# Patient Record
Sex: Male | Born: 1950 | Race: White | Hispanic: No | Smoking: Former smoker
Health system: Southern US, Community
[De-identification: ages and names within clinical notes are randomized; demographics above are authoritative.]

## PROBLEM LIST (undated history)

## (undated) DIAGNOSIS — J449 Chronic obstructive pulmonary disease, unspecified: Secondary | ICD-10-CM

## (undated) DIAGNOSIS — R351 Nocturia: Secondary | ICD-10-CM

## (undated) DIAGNOSIS — I779 Disorder of arteries and arterioles, unspecified: Secondary | ICD-10-CM

## (undated) DIAGNOSIS — R600 Localized edema: Secondary | ICD-10-CM

## (undated) DIAGNOSIS — I10 Essential (primary) hypertension: Secondary | ICD-10-CM

## (undated) DIAGNOSIS — Z789 Other specified health status: Secondary | ICD-10-CM

## (undated) DIAGNOSIS — G4733 Obstructive sleep apnea (adult) (pediatric): Secondary | ICD-10-CM

## (undated) DIAGNOSIS — I2510 Atherosclerotic heart disease of native coronary artery without angina pectoris: Secondary | ICD-10-CM

## (undated) DIAGNOSIS — M75102 Unspecified rotator cuff tear or rupture of left shoulder, not specified as traumatic: Secondary | ICD-10-CM

## (undated) DIAGNOSIS — E785 Hyperlipidemia, unspecified: Secondary | ICD-10-CM

## (undated) DIAGNOSIS — N529 Male erectile dysfunction, unspecified: Secondary | ICD-10-CM

## (undated) DIAGNOSIS — I219 Acute myocardial infarction, unspecified: Secondary | ICD-10-CM

## (undated) DIAGNOSIS — Z86018 Personal history of other benign neoplasm: Secondary | ICD-10-CM

## (undated) DIAGNOSIS — N3281 Overactive bladder: Secondary | ICD-10-CM

## (undated) DIAGNOSIS — I739 Peripheral vascular disease, unspecified: Secondary | ICD-10-CM

## (undated) DIAGNOSIS — I5030 Unspecified diastolic (congestive) heart failure: Secondary | ICD-10-CM

## (undated) DIAGNOSIS — K219 Gastro-esophageal reflux disease without esophagitis: Secondary | ICD-10-CM

## (undated) DIAGNOSIS — Z9989 Dependence on other enabling machines and devices: Secondary | ICD-10-CM

## (undated) DIAGNOSIS — Z951 Presence of aortocoronary bypass graft: Secondary | ICD-10-CM

## (undated) DIAGNOSIS — E119 Type 2 diabetes mellitus without complications: Secondary | ICD-10-CM

## (undated) DIAGNOSIS — Z993 Dependence on wheelchair: Secondary | ICD-10-CM

## (undated) DIAGNOSIS — I872 Venous insufficiency (chronic) (peripheral): Secondary | ICD-10-CM

## (undated) DIAGNOSIS — M21372 Foot drop, left foot: Secondary | ICD-10-CM

## (undated) DIAGNOSIS — M1990 Unspecified osteoarthritis, unspecified site: Secondary | ICD-10-CM

## (undated) DIAGNOSIS — I252 Old myocardial infarction: Secondary | ICD-10-CM

## (undated) DIAGNOSIS — M21371 Foot drop, right foot: Secondary | ICD-10-CM

## (undated) DIAGNOSIS — Z87828 Personal history of other (healed) physical injury and trauma: Secondary | ICD-10-CM

## (undated) DIAGNOSIS — Z872 Personal history of diseases of the skin and subcutaneous tissue: Secondary | ICD-10-CM

## (undated) DIAGNOSIS — R748 Abnormal levels of other serum enzymes: Secondary | ICD-10-CM

## (undated) DIAGNOSIS — N3941 Urge incontinence: Secondary | ICD-10-CM

## (undated) HISTORY — DX: Type 2 diabetes mellitus without complications: E11.9

## (undated) HISTORY — PX: TRIGGER FINGER RELEASE: SHX641

## (undated) HISTORY — DX: Abnormal levels of other serum enzymes: R74.8

## (undated) HISTORY — DX: Personal history of other (healed) physical injury and trauma: Z87.828

## (undated) HISTORY — PX: EYE SURGERY: SHX253

## (undated) HISTORY — DX: Acute myocardial infarction, unspecified: I21.9

## (undated) HISTORY — PX: CARDIAC CATHETERIZATION: SHX172

## (undated) HISTORY — DX: Peripheral vascular disease, unspecified: I73.9

## (undated) HISTORY — PX: CARDIOVASCULAR STRESS TEST: SHX262

## (undated) HISTORY — DX: Essential (primary) hypertension: I10

## (undated) HISTORY — DX: Disorder of arteries and arterioles, unspecified: I77.9

## (undated) HISTORY — PX: CORONARY ARTERY BYPASS GRAFT: SHX141

## (undated) HISTORY — DX: Chronic obstructive pulmonary disease, unspecified: J44.9

## (undated) HISTORY — PX: CHOLECYSTECTOMY: SHX55

## (undated) HISTORY — DX: Unspecified diastolic (congestive) heart failure: I50.30

## (undated) HISTORY — DX: Hyperlipidemia, unspecified: E78.5

## (undated) HISTORY — PX: TEAR DUCT PROBING: SHX793

## (undated) HISTORY — PX: LUMBAR FUSION: SHX111

## (undated) HISTORY — PX: BLEPHAROPLASTY: SUR158

## (undated) HISTORY — DX: Nocturia: R35.1

## (undated) HISTORY — DX: Atherosclerotic heart disease of native coronary artery without angina pectoris: I25.10

---

## 1992-01-02 HISTORY — PX: OTHER SURGICAL HISTORY: SHX169

## 1998-04-07 ENCOUNTER — Encounter: Payer: Self-pay | Admitting: Neurosurgery

## 1998-04-07 ENCOUNTER — Ambulatory Visit (HOSPITAL_COMMUNITY): Admission: RE | Admit: 1998-04-07 | Discharge: 1998-04-07 | Payer: Self-pay | Admitting: Neurosurgery

## 1998-05-27 ENCOUNTER — Other Ambulatory Visit: Admission: RE | Admit: 1998-05-27 | Discharge: 1998-05-27 | Payer: Self-pay | Admitting: Gastroenterology

## 1998-06-16 ENCOUNTER — Ambulatory Visit (HOSPITAL_COMMUNITY): Admission: RE | Admit: 1998-06-16 | Discharge: 1998-06-16 | Payer: Self-pay | Admitting: Neurosurgery

## 1998-06-16 ENCOUNTER — Encounter: Payer: Self-pay | Admitting: Neurosurgery

## 1998-07-15 ENCOUNTER — Encounter: Payer: Self-pay | Admitting: Neurosurgery

## 1998-07-19 ENCOUNTER — Inpatient Hospital Stay (HOSPITAL_COMMUNITY): Admission: RE | Admit: 1998-07-19 | Discharge: 1998-07-27 | Payer: Self-pay | Admitting: Neurosurgery

## 1998-07-19 ENCOUNTER — Encounter: Payer: Self-pay | Admitting: Neurosurgery

## 1998-07-27 ENCOUNTER — Inpatient Hospital Stay (HOSPITAL_COMMUNITY)
Admission: RE | Admit: 1998-07-27 | Discharge: 1998-08-12 | Payer: Self-pay | Admitting: Physical Medicine & Rehabilitation

## 1998-08-15 ENCOUNTER — Encounter
Admission: RE | Admit: 1998-08-15 | Discharge: 1998-11-13 | Payer: Self-pay | Admitting: Physical Medicine & Rehabilitation

## 1998-09-08 ENCOUNTER — Ambulatory Visit (HOSPITAL_COMMUNITY): Admission: RE | Admit: 1998-09-08 | Discharge: 1998-09-08 | Payer: Self-pay | Admitting: Internal Medicine

## 1998-11-14 ENCOUNTER — Encounter
Admission: RE | Admit: 1998-11-14 | Discharge: 1999-01-11 | Payer: Self-pay | Admitting: Physical Medicine & Rehabilitation

## 1999-01-31 ENCOUNTER — Encounter
Admission: RE | Admit: 1999-01-31 | Discharge: 1999-03-22 | Payer: Self-pay | Admitting: Physical Medicine & Rehabilitation

## 1999-07-10 ENCOUNTER — Ambulatory Visit (HOSPITAL_COMMUNITY): Admission: RE | Admit: 1999-07-10 | Discharge: 1999-07-10 | Payer: Self-pay | Admitting: Neurosurgery

## 1999-07-10 ENCOUNTER — Encounter: Payer: Self-pay | Admitting: Neurosurgery

## 1999-07-11 ENCOUNTER — Encounter: Payer: Self-pay | Admitting: Neurosurgery

## 1999-08-14 ENCOUNTER — Encounter: Payer: Self-pay | Admitting: Neurosurgery

## 1999-08-15 ENCOUNTER — Inpatient Hospital Stay (HOSPITAL_COMMUNITY): Admission: RE | Admit: 1999-08-15 | Discharge: 1999-08-16 | Payer: Self-pay | Admitting: Neurosurgery

## 1999-08-15 ENCOUNTER — Encounter: Payer: Self-pay | Admitting: Neurosurgery

## 1999-08-15 HISTORY — PX: ANTERIOR CERVICAL DECOMP/DISCECTOMY FUSION: SHX1161

## 1999-08-17 ENCOUNTER — Inpatient Hospital Stay (HOSPITAL_COMMUNITY): Admission: AD | Admit: 1999-08-17 | Discharge: 1999-08-22 | Payer: Self-pay | Admitting: Neurosurgery

## 1999-08-17 ENCOUNTER — Encounter: Admission: RE | Admit: 1999-08-17 | Discharge: 1999-08-17 | Payer: Self-pay | Admitting: Neurosurgery

## 1999-08-17 ENCOUNTER — Encounter: Payer: Self-pay | Admitting: Neurosurgery

## 1999-11-30 ENCOUNTER — Encounter: Payer: Self-pay | Admitting: Neurosurgery

## 1999-11-30 ENCOUNTER — Ambulatory Visit (HOSPITAL_COMMUNITY): Admission: RE | Admit: 1999-11-30 | Discharge: 1999-11-30 | Payer: Self-pay | Admitting: Neurosurgery

## 2000-07-22 ENCOUNTER — Ambulatory Visit (HOSPITAL_COMMUNITY): Admission: RE | Admit: 2000-07-22 | Discharge: 2000-07-22 | Payer: Self-pay | Admitting: Neurosurgery

## 2000-07-22 ENCOUNTER — Encounter: Payer: Self-pay | Admitting: Neurosurgery

## 2000-11-13 ENCOUNTER — Ambulatory Visit (HOSPITAL_COMMUNITY): Admission: RE | Admit: 2000-11-13 | Discharge: 2000-11-13 | Payer: Self-pay | Admitting: Neurosurgery

## 2000-11-13 ENCOUNTER — Encounter: Payer: Self-pay | Admitting: Neurosurgery

## 2000-11-26 ENCOUNTER — Ambulatory Visit (HOSPITAL_COMMUNITY): Admission: RE | Admit: 2000-11-26 | Discharge: 2000-11-26 | Payer: Self-pay | Admitting: Gastroenterology

## 2000-11-26 ENCOUNTER — Encounter: Payer: Self-pay | Admitting: Gastroenterology

## 2001-09-16 ENCOUNTER — Ambulatory Visit (HOSPITAL_COMMUNITY): Admission: RE | Admit: 2001-09-16 | Discharge: 2001-09-16 | Payer: Self-pay | Admitting: Neurosurgery

## 2001-09-16 ENCOUNTER — Encounter: Payer: Self-pay | Admitting: Neurosurgery

## 2001-11-06 ENCOUNTER — Ambulatory Visit (HOSPITAL_COMMUNITY): Admission: RE | Admit: 2001-11-06 | Discharge: 2001-11-06 | Payer: Self-pay | Admitting: Gastroenterology

## 2001-11-06 ENCOUNTER — Encounter: Payer: Self-pay | Admitting: Gastroenterology

## 2001-11-07 ENCOUNTER — Ambulatory Visit (HOSPITAL_COMMUNITY): Admission: RE | Admit: 2001-11-07 | Discharge: 2001-11-07 | Payer: Self-pay | Admitting: Gastroenterology

## 2001-11-07 ENCOUNTER — Encounter: Payer: Self-pay | Admitting: Gastroenterology

## 2001-11-13 ENCOUNTER — Ambulatory Visit (HOSPITAL_COMMUNITY): Admission: RE | Admit: 2001-11-13 | Discharge: 2001-11-13 | Payer: Self-pay | Admitting: Gastroenterology

## 2001-11-13 ENCOUNTER — Encounter: Payer: Self-pay | Admitting: Gastroenterology

## 2001-11-14 ENCOUNTER — Ambulatory Visit (HOSPITAL_COMMUNITY): Admission: RE | Admit: 2001-11-14 | Discharge: 2001-11-14 | Payer: Self-pay | Admitting: Gastroenterology

## 2001-11-14 ENCOUNTER — Encounter: Payer: Self-pay | Admitting: Gastroenterology

## 2001-11-21 ENCOUNTER — Encounter: Admission: RE | Admit: 2001-11-21 | Discharge: 2001-11-21 | Payer: Self-pay | Admitting: Gastroenterology

## 2001-11-21 ENCOUNTER — Encounter: Payer: Self-pay | Admitting: Gastroenterology

## 2002-01-01 HISTORY — PX: OTHER SURGICAL HISTORY: SHX169

## 2002-01-13 ENCOUNTER — Observation Stay (HOSPITAL_COMMUNITY): Admission: RE | Admit: 2002-01-13 | Discharge: 2002-01-14 | Payer: Self-pay | Admitting: Surgery

## 2002-01-13 ENCOUNTER — Encounter: Payer: Self-pay | Admitting: Surgery

## 2002-01-13 ENCOUNTER — Encounter (INDEPENDENT_AMBULATORY_CARE_PROVIDER_SITE_OTHER): Payer: Self-pay

## 2002-01-13 HISTORY — PX: LAPAROSCOPIC CHOLECYSTECTOMY: SUR755

## 2002-03-27 ENCOUNTER — Encounter: Payer: Self-pay | Admitting: Neurosurgery

## 2002-03-27 ENCOUNTER — Ambulatory Visit (HOSPITAL_COMMUNITY): Admission: RE | Admit: 2002-03-27 | Discharge: 2002-03-27 | Payer: Self-pay | Admitting: Neurosurgery

## 2002-05-26 ENCOUNTER — Ambulatory Visit (HOSPITAL_COMMUNITY): Admission: RE | Admit: 2002-05-26 | Discharge: 2002-05-26 | Payer: Self-pay | Admitting: Gastroenterology

## 2003-01-02 DIAGNOSIS — I219 Acute myocardial infarction, unspecified: Secondary | ICD-10-CM

## 2003-01-02 HISTORY — DX: Acute myocardial infarction, unspecified: I21.9

## 2003-02-07 ENCOUNTER — Inpatient Hospital Stay (HOSPITAL_COMMUNITY): Admission: EM | Admit: 2003-02-07 | Discharge: 2003-02-12 | Payer: Self-pay | Admitting: Emergency Medicine

## 2003-02-07 HISTORY — PX: CORONARY ANGIOPLASTY WITH STENT PLACEMENT: SHX49

## 2003-02-11 ENCOUNTER — Encounter: Payer: Self-pay | Admitting: Cardiology

## 2003-04-19 ENCOUNTER — Ambulatory Visit (HOSPITAL_COMMUNITY): Admission: RE | Admit: 2003-04-19 | Discharge: 2003-04-19 | Payer: Self-pay | Admitting: Neurosurgery

## 2003-04-22 ENCOUNTER — Inpatient Hospital Stay (HOSPITAL_COMMUNITY): Admission: AD | Admit: 2003-04-22 | Discharge: 2003-05-05 | Payer: Self-pay | Admitting: Cardiology

## 2003-12-28 ENCOUNTER — Inpatient Hospital Stay (HOSPITAL_COMMUNITY): Admission: EM | Admit: 2003-12-28 | Discharge: 2003-12-30 | Payer: Self-pay | Admitting: Emergency Medicine

## 2004-03-23 ENCOUNTER — Ambulatory Visit (HOSPITAL_COMMUNITY): Admission: RE | Admit: 2004-03-23 | Discharge: 2004-03-23 | Payer: Self-pay | Admitting: Neurosurgery

## 2004-08-10 ENCOUNTER — Encounter: Admission: RE | Admit: 2004-08-10 | Discharge: 2004-08-10 | Payer: Self-pay | Admitting: Internal Medicine

## 2004-09-11 ENCOUNTER — Ambulatory Visit (HOSPITAL_COMMUNITY): Admission: RE | Admit: 2004-09-11 | Discharge: 2004-09-11 | Payer: Self-pay | Admitting: Internal Medicine

## 2005-01-22 ENCOUNTER — Ambulatory Visit (HOSPITAL_COMMUNITY): Admission: RE | Admit: 2005-01-22 | Discharge: 2005-01-22 | Payer: Self-pay | Admitting: Neurosurgery

## 2005-03-19 ENCOUNTER — Inpatient Hospital Stay (HOSPITAL_COMMUNITY): Admission: EM | Admit: 2005-03-19 | Discharge: 2005-03-21 | Payer: Self-pay | Admitting: Emergency Medicine

## 2005-12-26 ENCOUNTER — Ambulatory Visit (HOSPITAL_COMMUNITY): Admission: RE | Admit: 2005-12-26 | Discharge: 2005-12-27 | Payer: Self-pay | Admitting: Urology

## 2005-12-26 HISTORY — PX: PENILE PROSTHESIS PLACEMENT: SHX739

## 2006-03-26 ENCOUNTER — Ambulatory Visit (HOSPITAL_COMMUNITY): Admission: RE | Admit: 2006-03-26 | Discharge: 2006-03-26 | Payer: Self-pay | Admitting: Neurosurgery

## 2007-04-09 ENCOUNTER — Ambulatory Visit: Payer: Self-pay | Admitting: Gastroenterology

## 2007-04-15 ENCOUNTER — Encounter: Payer: Self-pay | Admitting: Gastroenterology

## 2007-04-15 ENCOUNTER — Ambulatory Visit: Payer: Self-pay | Admitting: Gastroenterology

## 2007-10-29 ENCOUNTER — Ambulatory Visit: Payer: Self-pay | Admitting: Vascular Surgery

## 2008-01-28 ENCOUNTER — Ambulatory Visit: Payer: Self-pay | Admitting: Pulmonary Disease

## 2008-01-28 DIAGNOSIS — J309 Allergic rhinitis, unspecified: Secondary | ICD-10-CM | POA: Insufficient documentation

## 2008-01-28 DIAGNOSIS — I219 Acute myocardial infarction, unspecified: Secondary | ICD-10-CM | POA: Insufficient documentation

## 2008-01-28 DIAGNOSIS — J438 Other emphysema: Secondary | ICD-10-CM | POA: Insufficient documentation

## 2008-01-28 DIAGNOSIS — R0609 Other forms of dyspnea: Secondary | ICD-10-CM | POA: Insufficient documentation

## 2008-02-09 ENCOUNTER — Encounter: Payer: Self-pay | Admitting: Pulmonary Disease

## 2008-02-09 ENCOUNTER — Ambulatory Visit: Payer: Self-pay | Admitting: Pulmonary Disease

## 2008-02-10 ENCOUNTER — Telehealth (INDEPENDENT_AMBULATORY_CARE_PROVIDER_SITE_OTHER): Payer: Self-pay | Admitting: *Deleted

## 2008-03-11 ENCOUNTER — Encounter: Admission: RE | Admit: 2008-03-11 | Discharge: 2008-03-11 | Payer: Self-pay | Admitting: Neurosurgery

## 2008-05-19 ENCOUNTER — Ambulatory Visit: Payer: Self-pay | Admitting: Vascular Surgery

## 2008-12-21 DIAGNOSIS — M47817 Spondylosis without myelopathy or radiculopathy, lumbosacral region: Secondary | ICD-10-CM | POA: Insufficient documentation

## 2008-12-21 DIAGNOSIS — Z720 Tobacco use: Secondary | ICD-10-CM | POA: Insufficient documentation

## 2008-12-21 DIAGNOSIS — R2689 Other abnormalities of gait and mobility: Secondary | ICD-10-CM | POA: Insufficient documentation

## 2009-01-05 ENCOUNTER — Ambulatory Visit: Payer: Self-pay | Admitting: Vascular Surgery

## 2009-08-29 ENCOUNTER — Ambulatory Visit: Payer: Self-pay | Admitting: Cardiology

## 2009-08-29 LAB — LIPID PANEL
HDL: 26 mg/dL — AB (ref 35–70)
LDL Cholesterol: 55 mg/dL

## 2009-09-08 ENCOUNTER — Ambulatory Visit: Payer: Self-pay | Admitting: Vascular Surgery

## 2010-02-16 ENCOUNTER — Encounter: Payer: Self-pay | Admitting: Cardiology

## 2010-02-16 DIAGNOSIS — I252 Old myocardial infarction: Secondary | ICD-10-CM | POA: Insufficient documentation

## 2010-02-16 DIAGNOSIS — I10 Essential (primary) hypertension: Secondary | ICD-10-CM | POA: Insufficient documentation

## 2010-02-16 DIAGNOSIS — IMO0001 Reserved for inherently not codable concepts without codable children: Secondary | ICD-10-CM | POA: Insufficient documentation

## 2010-02-16 DIAGNOSIS — I259 Chronic ischemic heart disease, unspecified: Secondary | ICD-10-CM | POA: Insufficient documentation

## 2010-02-16 DIAGNOSIS — E119 Type 2 diabetes mellitus without complications: Secondary | ICD-10-CM | POA: Insufficient documentation

## 2010-02-16 DIAGNOSIS — J449 Chronic obstructive pulmonary disease, unspecified: Secondary | ICD-10-CM | POA: Insufficient documentation

## 2010-02-16 DIAGNOSIS — I779 Disorder of arteries and arterioles, unspecified: Secondary | ICD-10-CM | POA: Insufficient documentation

## 2010-03-31 ENCOUNTER — Ambulatory Visit (INDEPENDENT_AMBULATORY_CARE_PROVIDER_SITE_OTHER): Payer: Medicare Other | Admitting: Cardiology

## 2010-03-31 ENCOUNTER — Encounter: Payer: Self-pay | Admitting: Cardiology

## 2010-03-31 VITALS — BP 140/70 | HR 65 | Wt 198.0 lb

## 2010-03-31 DIAGNOSIS — I259 Chronic ischemic heart disease, unspecified: Secondary | ICD-10-CM

## 2010-03-31 DIAGNOSIS — I4891 Unspecified atrial fibrillation: Secondary | ICD-10-CM

## 2010-03-31 DIAGNOSIS — R079 Chest pain, unspecified: Secondary | ICD-10-CM

## 2010-03-31 DIAGNOSIS — E7800 Pure hypercholesterolemia, unspecified: Secondary | ICD-10-CM

## 2010-03-31 DIAGNOSIS — I119 Hypertensive heart disease without heart failure: Secondary | ICD-10-CM | POA: Insufficient documentation

## 2010-03-31 DIAGNOSIS — D497 Neoplasm of unspecified behavior of endocrine glands and other parts of nervous system: Secondary | ICD-10-CM | POA: Insufficient documentation

## 2010-03-31 DIAGNOSIS — R0789 Other chest pain: Secondary | ICD-10-CM | POA: Insufficient documentation

## 2010-03-31 LAB — COMPREHENSIVE METABOLIC PANEL
ALT: 38 U/L (ref 0–53)
AST: 27 U/L (ref 0–37)
Albumin: 4.1 g/dL (ref 3.5–5.2)
BUN: 16 mg/dL (ref 6–23)
Calcium: 9.3 mg/dL (ref 8.4–10.5)
Chloride: 104 mEq/L (ref 96–112)
Potassium: 4.9 mEq/L (ref 3.5–5.1)

## 2010-03-31 LAB — LIPID PANEL: Total CHOL/HDL Ratio: 4

## 2010-03-31 MED ORDER — NITROGLYCERIN 0.4 MG SL SUBL: 0.4000 mg | SUBLINGUAL_TABLET | SUBLINGUAL | Status: DC | PRN

## 2010-03-31 NOTE — Assessment & Plan Note (Signed)
This patient with known ischemic heart disease and previous coronary artery bypass graft surgery as been experiencing intermittent left-sided chest discomfort with radiation down the left arm.  It does respond slowly to sublingual nitroglycerin.  His last cardiac catheterization was in 2007.  His coronary bypass graft surgery was in 2005.  His last nuclear stress test was in 2009.  We will set him up for a LexiScan Myoview stress test.  His EKG today shows normal sinus rhythm and new lateral T-wave inversion in V5 and V6 as well as old T-wave inversion in 3 and aVF.  We called him in some fresh nitroglycerin

## 2010-03-31 NOTE — Assessment & Plan Note (Signed)
History of myocardial infarction with stenting and subsequent coronary bypass graft surgery all in 2005.  He will continue same cardiac medication.

## 2010-03-31 NOTE — Progress Notes (Signed)
History of Present Illness: This 60 year old gentleman is seen for a scheduled two-month followup visit.  He is a history of ischemic heart disease.  He had a past history of myocardial infarction with a stent in 2005 and subsequently underwent coronary artery bypass graft surgery also in 2005.  His last nuclear stress test was 02/13/07 showing an ejection fraction of 45% and no reversible ischemia.  He does have inferior wall hypokinesis.  Since last visit the patient has been experiencing more left-sided chest discomfort.  He's also been more short of breath.  His chest discomfort is slowly responsive to sublingual nitroglycerin.  The patient has a history of COPD and quit smoking in 2011.  The patient has a history of peripheral arterial occlusive disease and is followed by Dr. Leonette Most fields.  He has a history of diabetic neuropathy.  He has not been experiencing any hypoglycemic episodes.  Current Outpatient Prescriptions  Medication Sig Dispense Refill  . Albuterol Sulfate (PROAIR HFA IN) Inhale into the lungs as needed.        [name] amLODipine (NORVASC) 5 MG tablet Take 5 mg by mouth daily.        [name] aspirin 81 MG tablet Take 81 mg by mouth daily.        [name] atorvastatin (LIPITOR) 20 MG tablet Take 20 mg by mouth daily.        . chlorpheniramine (CHLOR-TABLETS) 4 MG tablet Take 4 mg by mouth daily.        . clopidogrel (PLAVIX) 75 MG tablet Take 75 mg by mouth daily.        Tery Sanfilippo Calcium (STOOL SOFTENER PO) Take by mouth daily.        [name] doxazosin (CARDURA) 2 MG tablet Take 2 mg by mouth daily.        [name] ezetimibe (ZETIA) 10 MG tablet Take 10 mg by mouth daily.        [name] glimepiride (AMARYL) 1 MG tablet Take 1 mg by mouth every morning before breakfast.        . metoprolol (LOPRESSOR) 50 MG tablet Take 50 mg by mouth 2 (two) times daily.        . mometasone (NASONEX) 50 MCG/ACT nasal spray 2 sprays by Nasal route as needed.        . nitroGLYCERIN (NITROSTAT) 0.4 MG SL tablet Place 1 tablet (0.4 mg  total) under the tongue every 5 (five) minutes as needed.  100 tablet  3  . omeprazole (PRILOSEC) 20 MG capsule Take 20 mg by mouth daily.        [name] oxybutynin (DITROPAN XL) 15 MG 24 hr tablet Take 15 mg by mouth daily.        . Varenicline Tartrate (CHANTIX PO) Take by mouth as needed.        [name] DISCONTD: nitroGLYCERIN (NITROSTAT) 0.4 MG SL tablet Place 0.4 mg under the tongue every 5 (five) minutes as needed.          Allergies  Allergen Reactions  . Altace     cough  . Codeine   . Lisinopril     cough    Patient Active Problem List  Diagnoses  . MYOCARDIAL INFARCTION  . ALLERGIC RHINITIS  . EMPHYSEMA  . DYSPNEA  . Chronic ischemic heart disease  . COPD (chronic obstructive pulmonary disease)  . Diabetes mellitus  . PAD (peripheral artery disease)  . Chest pain radiating to arm  . Benign hypertensive heart disease without heart failure  .  Spinal cord tumor    History  Smoking status  . Former Smoker -- 0.5 packs/day for 30 years  . Types: Cigarettes  . Quit date: 01/01/2009  Smokeless tobacco  . Not on file    History  Alcohol Use     Family History  Problem Relation Age of Onset  . Other Father     WORK ACCIDENT  . Heart attack Mother   . Other Brother     SUICIDE  . Coronary artery disease Sister     CABG  . Coronary artery disease Brother     Review of Systems: Constitutional: no fever chills diaphoresis or fatigue or change in weight.  Head and neck: no hearing loss, no epistaxis, no photophobia or visual disturbance. Respiratory: No cough, shortness of breath or wheezing. Cardiovascular: No peripheral edema, palpitations. Gastrointestinal: No abdominal distention, no abdominal pain, no change in bowel habits hematochezia or melena. Genitourinary: No dysuria, no frequency, no urgency, no nocturia. Musculoskeletal:No arthralgias, no back pain, no gait disturbance or myalgias. Neurological: No dizziness, no headaches, no numbness, no seizures, no  syncope, , no tremors. Hematologic: No lymphadenopathy, no easy bruising. Psychiatric: No confusion, no hallucinations, no sleep disturbance.He has had some mild decrease in memory since last visit.    Physical Exam: Filed Vitals:   03/31/10 0938  BP: 140/70  Pulse: 65  The general appearance reveals a well-developed gentleman in a wheelchair.Pupils equal and reactive.   Extraocular Movements are full.  There is no scleral icterus.  The mouth and pharynx are normal.  The neck is supple.  The carotids reveal no bruits.  The jugular venous pressure is normal.  The thyroid is not enlarged.  There is no lymphadenopathy.The chest is clear to percussion and auscultation. There are no rales or rhonchi. Expansion of the chest is symmetrical.The precordium is quiet.  The first heart sound is normal.  The second heart sound is physiologically split.  There is no murmur gallop rub or click.  There is no abnormal lift or heave.The abdomen is soft and nontender. Bowel sounds are normal. The liver and spleen are not enlarged. There Are no abdominal masses. There are no bruits.  Extremities reveal bilateral lower extremity weakness.  There is trace pretibial edema.   Assessment / Plan: We will set him up for a LexiScan Myoview stress test to evaluate his left-sided chest discomfort further.

## 2010-03-31 NOTE — Assessment & Plan Note (Signed)
The patient has a remote history of spinal cord tumor which has left him with limited weakness of his legs.  He travels by wheelchair

## 2010-04-04 ENCOUNTER — Ambulatory Visit (HOSPITAL_COMMUNITY): Payer: Medicare Other | Attending: Cardiology | Admitting: Radiology

## 2010-04-04 VITALS — Ht 66.0 in | Wt 194.0 lb

## 2010-04-04 DIAGNOSIS — R0789 Other chest pain: Secondary | ICD-10-CM

## 2010-04-04 DIAGNOSIS — I25810 Atherosclerosis of coronary artery bypass graft(s) without angina pectoris: Secondary | ICD-10-CM

## 2010-04-04 DIAGNOSIS — R079 Chest pain, unspecified: Secondary | ICD-10-CM

## 2010-04-04 DIAGNOSIS — R0602 Shortness of breath: Secondary | ICD-10-CM

## 2010-04-04 DIAGNOSIS — E119 Type 2 diabetes mellitus without complications: Secondary | ICD-10-CM

## 2010-04-04 MED ORDER — TECHNETIUM TC 99M TETROFOSMIN IV KIT
33.0000 | PACK | Freq: Once | INTRAVENOUS | Status: AC | PRN
  Administered 2010-04-04: 33 via INTRAVENOUS

## 2010-04-04 MED ORDER — REGADENOSON 0.4 MG/5ML IV SOLN
0.4000 mg | Freq: Once | INTRAVENOUS | Status: AC
  Administered 2010-04-04: 0.4 mg via INTRAVENOUS

## 2010-04-04 MED ORDER — TECHNETIUM TC 99M TETROFOSMIN IV KIT
11.0000 | PACK | Freq: Once | INTRAVENOUS | Status: AC | PRN
  Administered 2010-04-04: 11 via INTRAVENOUS

## 2010-04-04 NOTE — Progress Notes (Signed)
[facility] SITE 3 NUCLEAR MED [address] [place] [address] [phone]  Cardiology Nuclear Med Study  Hunter [name] is a 60 y.o. male [id number] [birth year]   Nuclear Med Background Indication for Stress Test:  Evaluation for Ischemia, Graft Patency, PTCA/Stent Patency History:  '05 MI>PTCA/Stent>CABG, '05 Echo:EF=40-50%,'07 Cath:patient grafts, potential areas of ischemia are Cfx/RCA, '09 MPS:no ischemia, EF=45%, h/o atrial fib. Cardiac Risk Factors: Family History - CAD, History of Smoking, Hypertension, Lipids, NIDDM, Obesity and PVD  Symptoms:  Chest Pain (last episode of UX:[id number]), Dizziness, DOE, Fatigue, Near Syncope, Palpitations and SOB   Nuclear Pre-Procedure Caffeine/Decaff Intake:  None NPO After: 7:00pm   Lungs:  Clear.  O2 sat 98% on RA IV 0.9% NS with Angio Cath:  20g  IV Site: R Antecubital  IV Started by:  Stanton Kidney, EMT-P  Chest Size (in):  44 Cup Size: n/a  Height: 5\' 6"  (1.676 m)  Weight:  194 lb (87.998 kg)  BMI:  Body mass index is 31.31 kg/(m^2). Tech Comments:  Lopressor held this am; no medications taken today per patient.    Nuclear Med Study 1 or 2 day study: 1 day  Stress Test Type:  Lexiscan  Reading MD: Cassell Clement, MD  Order Authorizing Provider:  Cassell Clement, MD  Resting Radionuclide: Technetium 19m Tetrofosmin  Resting Radionuclide Dose: 11 mCi   Stress Radionuclide:  Technetium 55m Tetrofosmin  Stress Radionuclide Dose: 33 mCi           Stress Protocol Rest HR: 63 Stress HR: 95  Rest BP: 145/66 Stress BP: 163/57  Exercise Time (min): n/a METS: n/a   Predicted Max HR: 161 bpm % Max HR: 59.01 bpm Rate Pressure Product: 25366   Dose of Adenosine (mg):  n/a Dose of Lexiscan: 0.4 mg  Dose of Atropine (mg): n/a Dose of Dobutamine: n/a mcg/kg/min (at max HR)  Stress Test Technologist: Rea [employer], CMA-N  Nuclear Technologist:  Domenic Polite, CNMT     Rest Procedure:  Myocardial perfusion  imaging was performed at rest 45 minutes following the intravenous administration of Technetium 17m Tetrofosmin. Rest ECG: Nonspecific T-wave changes, ? IWMI.  Stress Procedure:  The patient received IV Lexiscan 0.4 mg over 15-seconds.  Technetium 65m Tetrofosmin injected at 30-seconds.  There were no significant changes with Lexiscan.  Quantitative spect images were obtained after a 45 minute delay. Stress ECG: No significant ST segment change suggestive of ischemia.  QPS Raw Data Images:  Normal; no motion artifact; normal heart/lung ratio. Stress Images:  Normal homogeneous uptake in all areas of the myocardium. Rest Images:  Normal homogeneous uptake in all areas of the myocardium. Subtraction (SDS):  Normal Transient Ischemic Dilatation (Normal <1.22):  1.03 Lung/Heart Ratio (Normal <0.45):  0.35  Quantitative Gated Spect Images QGS EDV:  88 ml QGS ESV:  41 ml QGS cine images:  NL LV Function; NL Wall Motion QGS EF: 53%  Impression Exercise Capacity:  Lexiscan with no exercise. BP Response:  Normal blood pressure response. Clinical Symptoms:  Mild chest pain/dyspnea. ECG Impression:  No significant ST segment change suggestive of ischemia. Comparison with Prior Nuclear Study:Since last cardiolite of 02/13/07, EF has improved from 45% to 53%  Overall Impression:  Normal stress nuclear study.    Cassell Clement

## 2010-04-05 ENCOUNTER — Telehealth: Payer: Self-pay | Admitting: *Deleted

## 2010-04-05 NOTE — Progress Notes (Signed)
ROUTED TO DR. BRACKBILL.Falecha Clark ° ° °

## 2010-04-05 NOTE — Telephone Encounter (Signed)
Message copied by Regis Bill on Wed Apr 05, 2010 10:41 AM ------      Message from: Cassell Clement      Created: Fri Mar 31, 2010  5:10 PM       Please report.Blood work is satisfactory except blood sugarIs elevated at 151.The lipids are good.  Continue same medication.

## 2010-04-05 NOTE — Telephone Encounter (Signed)
Advised patient of labs. Watch carbs and sweets

## 2010-04-05 NOTE — Telephone Encounter (Signed)
Message copied by Regis Bill on Wed Apr 05, 2010 10:39 AM ------      Message from: Cassell Clement      Created: Fri Mar 31, 2010  5:10 PM       Please report.Blood work is satisfactory except blood sugarIs elevated at 151.The lipids are good.  Continue same medication.

## 2010-04-06 ENCOUNTER — Telehealth: Payer: Self-pay | Admitting: *Deleted

## 2010-04-06 ENCOUNTER — Encounter (HOSPITAL_COMMUNITY): Payer: Self-pay | Admitting: Cardiology

## 2010-04-06 NOTE — Telephone Encounter (Signed)
Advised patient of stress test results 

## 2010-04-10 ENCOUNTER — Other Ambulatory Visit: Payer: Self-pay | Admitting: Neurosurgery

## 2010-04-10 DIAGNOSIS — M4802 Spinal stenosis, cervical region: Secondary | ICD-10-CM

## 2010-04-12 ENCOUNTER — Ambulatory Visit
Admission: RE | Admit: 2010-04-12 | Discharge: 2010-04-12 | Disposition: A | Discharge status: Home / Self Care | Payer: Medicare Other | Source: Ambulatory Visit | Attending: Neurosurgery | Admitting: Neurosurgery

## 2010-04-12 DIAGNOSIS — M4802 Spinal stenosis, cervical region: Secondary | ICD-10-CM

## 2010-04-13 ENCOUNTER — Encounter (INDEPENDENT_AMBULATORY_CARE_PROVIDER_SITE_OTHER): Payer: Medicare Other

## 2010-04-13 DIAGNOSIS — M79609 Pain in unspecified limb: Secondary | ICD-10-CM

## 2010-04-24 ENCOUNTER — Encounter: Payer: Self-pay | Admitting: Cardiology

## 2010-05-11 ENCOUNTER — Encounter: Payer: Self-pay | Admitting: Cardiology

## 2010-05-16 NOTE — Assessment & Plan Note (Signed)
OFFICE VISIT   Hunter [name], DEERMAN  DOB:  May 30, 1950                                       10/29/2007  JYNWG#:[id number]   The patient was referred by Dr. Eloise Harman for decreased pulses in his  feet.  He has a several week history of a burning sensation in his left  foot.  He also had an episode of lower extremity swelling approximately  4-5 weeks ago.  He was on vacation at that time and had had his legs in  a dependent position for a considerable amount of time.   He states that he has not really had significant problems with leg  swelling until that time.   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  He has had multiple operations on his back and neck.  He has some  occasional burning in his right foot.  He has some left hip and thigh  pain when walking.  This has been chronic in nature after all of his  multiple back operations.  All of his spine surgery has been performed  by Dr. Channing Mutters.   He notes that still he has swelling in his legs when they are in  dependent position.  He has improvement of his symptoms when his legs  are elevated.   He denies rest pain.   His atherosclerotic risk factors include hypertension, borderline  diabetes, elevated cholesterol, coronary artery disease with myocardial  infarction in February of 2004.   PAST MEDICAL HISTORY:  Is otherwise remarkable for COPD and emphysema.  He also has chronic back pain, reflux, abnormal liver function tests  with fatty infiltration of his liver, vitamin D deficiency.   PAST SURGICAL HISTORY:  Coronary artery bypass grafting in April of  2004.  He had harvesting of his left and right greater saphenous vein at  that time.  He has had multiple C-spine and lumbar spine operations..  He has had open reduction and internal fixation of his right leg.  He  had a cholecystectomy.  He had repair of a pelvic fracture.  He had a  right inguinal hernia repair on two  occasions.   MEDICATIONS:  1. Zetia 10 mg once a day.  2. Metoprolol 50 mg two per day.  3. Plavix 75 mg once a day.  4. Lipitor 20 mg half tablet once a day.  5. Omeprazole 20 mg once a day.  6. Amlodipine 5 mg once a day.  7. Oxybutynin 15 mg two per day.  8. ProAir inhaler p.r.n.  9. Ipratropium bromide nasal spray 0.06% p.r.n.  10.Aspirin 81 mg once a day.  11.Chlortab 4 mg once a day  12.Stool softener 100 mg once a day.  13.NitroQuick 0.4 mg p.r.n.  14.Albuterol 2.5 mg three per day.   ALLERGIES:  He has no known drug allergies but does have a side effect  from codeine of abdominal cramping.   FAMILY HISTORY:  Is remarkable for his mother, brother and sisters who  had vascular disease at age less than 49.   SOCIAL HISTORY:  He is single, has two children.  He is disabled from  his back problems.  He currently smokes half pack of cigarettes per day.  He does not consume alcohol regularly.   REVIEW OF SYSTEMS:  VITAL SIGNS:  He is 5  feet 5 inches, 180 pounds.  CARDIAC:  He has shortness of breath with exertion.  PULMONARY:  He has some occasional wheezing.  VASCULAR:  He denies history TIA or stroke.  ORTHOPEDIC:  He has multiple joint arthritis and muscle pain.  GI, renal, psychiatric, ENT review of systems are all negative.   PHYSICAL EXAMINATION:  Vital signs:  On physical exam blood pressure is  132/77 in the left arm, heart rate 62 and regular.  HEENT:  Unremarkable.  He has 2+ carotid pulses without bruit.  Chest:  Clear to  auscultation.  Cardiac:  Is regular rate and rhythm without murmur.  Abdomen:  Is obese, soft, nontender, nondistended.  Extremities:  He has  absent femoral, popliteal, dorsalis pedis and posterior tibial pulses  bilaterally.  He has trace edema in both lower extremities but overall  his legs are fairly normal in appearance.  He does have a left foot  drop.  He has 2+ brachial and radial pulses bilaterally.   He had bilateral ABIs performed  today which were 0.65 on the right, 0.58  on the left.  Doppler waveforms were monophasic in the posterior tibial  and dorsalis pedis arteries bilaterally.   In summary, the patient has symptoms of lower extremity swelling.  These  seem to be improved with elevation.  He also has burning, numbness and  tingling in his lower extremities.  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  He does not ambulate significantly and  denies any claudication symptoms.  He does not have evidence of rest  pain or open wounds on the foot.  His ABIs are 0.6 bilaterally which  should be reasonable tissue perfusion and I do not believe he is  currently at risk of limb loss.  However, I did discuss with him at  length today with continued smoking and diabetes his risk of limb loss  would be extremely high.  I counseled him today on smoking cessation and  he should try to walk as much as possible.  As far as his lower  extremity swelling is concerned I do not believe this is related to DVT  since his symptoms have completely resolved at this point.  I counseled  him that he should wear compression stockings to try to improve his  lower extremity swelling.  Hopefully this will give him some symptomatic  relief.  He will follow up with me in six months' time for repeat ABIs  or sooner if he develops symptoms of rest pain or nonhealing wounds of  his foot.   Janetta Hora. Fields, MD  Electronically Signed   CEF/MEDQ  D:  10/30/2007  T:  10/30/2007  Job:  [id number]   cc:   Barry Dienes. Eloise Harman, M.D.

## 2010-05-16 NOTE — Assessment & Plan Note (Signed)
OFFICE VISIT   CHASE, ARNALL  DOB:  Aug 19, 1950                                       05/19/2008  JWJXB#:[id number]   The patient returns for follow-up today.  He was last seen in October  2009.  We have been seeing him for lower extremity arterial occlusive  disease.  His pain in his left leg is multifactorial, including arterial  occlusive disease as well as neuropathy and problems related to his  back.   Since his last visit with me, he states his symptoms are essentially  unchanged.  The right leg is asymptomatic.  He has a left foot drop  which is chronic.  He still has occasional burning and swelling in his  left foot.  Of note, he has previously had bilateral saphenectomies and  has no vein left for revascularization in either leg.  He states that  his back and hip go out after walking approximately 10-15 yards and he  does not really experience claudication.  He states overall his symptoms  are unchanged since his previous office visit.  He continues to take  Plavix 75 mg once a day and aspirin 81 mg once a day.   He had bilateral ABIs performed today which were 0.77 on the right, 0.63  on the left; this is slightly improved from his previous ABIs.   PHYSICAL EXAMINATION:  Vital Signs:  Blood pressure is 126/79 in the  left arm, pulse is 60 and regular.  Lower Extremities:  He has 2+  femoral pulses bilaterally.  He has absent popliteal and pedal pulses  bilaterally.  He has a foot drop on the left side with some atrophy of  left leg.  Both feet are pink, warm and reasonably perfused.   Overall, the patient is essentially unchanged since his last office  visit.  He has fairly minimal symptoms of his arterial occlusive  disease.  He has reasonable perfusion and he does not have limb-  threatening ischemia at this point.  I think the best option for him  currently is continued conservative management with risk factor  modification.  He will  try to walk as much as possible, but really is  minimally ambulatory.  He will follow up with me in 6 months' time for  repeat ABIs.   Janetta Hora. Fields, MD  Electronically Signed   CEF/MEDQ  D:  05/19/2008  T:  05/20/2008  Job:  [id number]   cc:   Barry Dienes. Eloise Harman, M.D.

## 2010-05-16 NOTE — Assessment & Plan Note (Signed)
[facility]                         GASTROENTEROLOGY OFFICE NOTE   Hunter [name], Hunter [name]                   MRN:          [id number]  DATE:04/09/2007                            DOB:          07/12/1950    PROBLEM:  Rectal bleeding.   Hunter [name] is a 60 year old white male with medical problems  including coronary artery disease, status post MI; lower extremity  weakness from spinal cord tumor and family history of colorectal CA, who  presents with rectal bleeding.  For the past three years, he has had  intermittent rectal bleeding, consisting of bright red blood, mixed with  the toilet water.  More recently, he has been complaining of aching  lower abdominal pain.  This is followed by bowel movements where he has  seen dark clots and blood, discoloring the water.  He denies frank  melena or bowel movements only of blood.  He last underwent colonoscopy  in 2004, where diverticulosis was seen.  Pertinent for his mother, who  has colon cancer.  He also has a history of cardiac disease and he is  status post MI and CABG.  He has occasional chest pain, which has been  attributed to angina.   PAST MEDICAL HISTORY:  Pertinent for coronary artery disease,  hypertension, hypercholesterolemia.  He is status post CABG,  herniorrhaphy, cholecystectomy and spinal cord tumor excision.  He has a  penile implant.   FAMILY HISTORY:  Pertinent for brother and sister with heart disease.   MEDICATIONS:  Include Zetia, metoprolol, Plavix, Lipitor, omeprazole,  amlodipine, oxybutynin, ProAir inhaler, baby aspirin, Chlortabs, stool  softener, Nitro-Quick and albuterol sulfate.   ALLERGIES:  He is allergic to CODEINE.   SOCIAL HISTORY:  He does not smoke.  He drinks rarely.  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   REVIEW OF SYSTEMS:  Positive for lower extremity weakness with a left-  foot drop, shortness of breath, leakage of urine and muscle cramps.  Other  systems reviewed and are negative.   PHYSICAL EXAMINATION:  He is a well-developed, well-nourished male.  Pulse 80, blood pressure 142/78, weight 183.  HEENT: EOMI.  PERRLA.  Sclerae are anicteric.  Conjunctivae are pink.  NECK:  Supple without thyromegaly, adenopathy or carotid bruits.  CHEST:  Clear to auscultation and percussion without adventitious  sounds.  CARDIAC:  Regular rhythm; normal S1 S2.  There are no murmurs, gallops  or rubs.  ABDOMEN:  Bowel sounds are normoactive.  Abdomen is soft, nontender and  nondistended.  There are no abdominal masses, tenderness, splenic  enlargement or hepatomegaly.  EXTREMITIES:  Full range of motion.  No cyanosis, clubbing or edema.  RECTAL:  There are no masses.  Hemoccult not tested.  MUSCULOSKELETAL EXAM:  There are no deformities.  He has some muscle  atrophy of his lower extremities bilaterally.  NEUROLOGIC EXAM:  He is alert and oriented times three.  There are no  gross focal abnormalities, except for a left foot drop.   IMPRESSION:  1. Persistent rectal bleeding.  This may be simply hemorrhoidal      bleeding.  Ischemic colitis  is a possibility, in view of his      history of ASCVD and complaints of abdominal pain, followed by      rectal bleeding.  A colonic lesion, secondary to a polyp or even      neoplasm, are other considerations.  2. Family history of colon cancer.  3. Coronary artery disease, status post MI.  4. Hypertension.   RECOMMENDATION:  Colonoscopy.     Barbette Hair. Arlyce Dice, MD,FACG  Electronically Signed    RDK/MedQ  DD: 04/09/2007  DT: 04/09/2007  Job #: [id number]   cc:   Barry Dienes. Eloise Harman, M.D.  Cassell Clement, M.D.

## 2010-05-16 NOTE — Letter (Signed)
April 09, 2007    Barry Dienes. Eloise Harman, M.D.  [address]  [place], [place] [address]   RE:  Hunter [name], Hunter [name]  MRN:  [id number]  /  DOB:  November 22, 1950   Dear Dr. Eloise Harman:   Upon your kind referral, I had the pleasure of evaluating your patient  and I am pleased to offer my findings.  I saw Hunter [name] in the  office today.  Enclosed is a copy of my progress note that details my  findings and recommendations.   Thank you for the opportunity to participate in your patient's care.    Sincerely,      Barbette Hair. Arlyce Dice, MD,FACG  Electronically Signed    RDK/MedQ  DD: 04/09/2007  DT: 04/09/2007  Job #: [id number]

## 2010-05-16 NOTE — Procedures (Signed)
LOWER EXTREMITY ARTERIAL EVALUATION-SINGLE LEVEL   INDICATION:  Follow-up evaluation of known arterial occlusive disease.  Patient has left leg semi-paralysis.  Patient complains of left foot  burning.   HISTORY:  Diabetes:  No.  Cardiac:  Coronary artery bypass graft.  Hypertension:  No.  Smoking:  Yes.  Previous Surgery:  Spinal surgery to remove a tumor caused some nerve  damage to the left leg and subsequent paralysis.  Patient spends most of  his time in a wheelchair.   RESTING SYSTOLIC PRESSURES: (ABI)                          RIGHT                LEFT  Brachial:               140                  138  Anterior tibial:        80                   88 (0.63)  Posterior tibial:       108 (0.77)           86  Peroneal:  DOPPLER WAVEFORM ANALYSIS:  Anterior tibial:        Monophasic           Monophasic  Posterior tibial:       Monophasic           Monophasic  Peroneal:   PREVIOUS ABI'S:  Date: 10/29/07  RIGHT:  0.65  LEFT:  0.58   IMPRESSION:  1. Ankle brachial indices are stable compared to previous study      bilaterally.  2. Ankle brachial indices suggest moderate arterial occlusive disease      bilaterally, left worse than right.   ___________________________________________  Janetta Hora. Fields, MD   MC/MEDQ  D:  05/19/2008  T:  05/19/2008  Job:  [id number]

## 2010-05-19 NOTE — Cardiovascular Report (Signed)
NAMEMarland Kitchen  SCHON, ZEIDERS NO.:  [id number]   MEDICAL RECORD NO.:  [id number]          PATIENT TYPE:  INP   LOCATION:  2023                         FACILITY:  [facility]   PHYSICIAN:  Peter M. Swaziland, M.D.  DATE OF BIRTH:  1950/01/16   DATE OF PROCEDURE:  03/20/2005  DATE OF DISCHARGE:                              CARDIAC CATHETERIZATION   INDICATIONS FOR PROCEDURE:  The patient is a 60 year old white male with  known coronary artery disease. He is status post previous myocardial  infarction with stenting of the right coronary. He subsequently underwent  coronary bypass surgery in April 2005. He presents now with refractory chest  pain.   PROCEDURES:  Left heart catheterization, coronary and left ventricular  angiography, saphenous vein graft angiography x3, left subclavian  angiography and LIMA graft angiography.   MEDICATIONS:  Local anesthesia 1% Xylocaine.   EQUIPMENT USED:  6-French 4 cm right and left Judkins catheter, 6-French  pigtail catheter, 6-French arterial sheath, 6-French B2 multipurpose  catheter and 6-French LIMA catheter.   CONTRAST:  190 cc of Omnipaque.   HEMODYNAMIC DATA:  Aortic pressure is 130/78 with a mean of 97 mmHg. Left  ventricular pressures 136 with EDP of 15 mmHg.   ANGIOGRAPHIC DATA:  The left coronary arises normally. Left main coronary is  very short and without significant disease.   The left anterior descending artery has a long 70% narrowing proximally and  is occluded at the takeoff of the first diagonal branch.   The left circumflex coronary artery arises normally. The first marginal  vessels is occluded proximally. There is then a bifurcation lesion at the  bifurcation of the second and third obtuse marginal vessels. This involves  the proximal second marginal vessel up to 90%. The third marginal vessel was  proximally 70% stenosed.   The right coronary artery arises and distributes normally. It is a very  small-caliber  vessel that is diffusely diseased throughout the proximal to  mid-vessel up to 70-80%. It is occluded at the crux.   Saphenous vein graft to the first diagonal is widely patent. There is some  mild tapering at the ostium but no significant atherosclerosis and there is  excellent antegrade flow with back filling of the LAD.   The saphenous vein graft to the first obtuse marginal vessel is widely  patent. There is minor 10% tapering proximally.   The saphenous vein graft to the right coronary artery has minor tapering  proximally up to 10%. The graft is widely patent with excellent distal  runoff. The graft inserts distally at the bifurcation of the PDA and the  posterolateral branches. Posterolateral branches were without significant  disease. The PDA bifurcates into two branches. The smaller of these branches  has a 90% stenosis proximally.   The left subclavian arises in a markedly anterior position on the aorta. It  is very difficult to cannulate. Using a B2 multipurpose, we were able to  engage the ostium of the left subclavian. We then used a Wholey wire to gain  access to the left subclavian and using an exchange Wholey wire we then  exchanged for a LIMA catheter. We were able to directly engage the IMA. This  was extremely difficult angulation, however. The IMA is widely patent  throughout its course with excellent distal runoff. There is no significant  obstructive disease in the mid to distal LAD, although there is scattered  disease throughout. The left subclavian is without significant stenosis. The  patient does have a 90% left vertebral stenosis.   Left ventricular angiography was performed in the RAO view. This  demonstrates some mild inferior wall hypokinesia with overall low normal  left ventricular systolic function. Ejection fraction estimated 50-55%.   FINAL INTERPRETATION:  1.  Severe three-vessel obstructive atherosclerotic coronary artery disease.  2.  Continued  patency of all grafts including saphenous vein graft to the      first diagonal, saphenous vein graft to the first obtuse marginal vessel      and saphenous vein graft to the distal right coronary artery. There is      also a patent left internal mammary artery graft to the left anterior      descending artery.  3.  Low normal left ventricular systolic function.  4.  Potential areas of ischemia and/or cause of chest pain would include the      distal circumflex distribution with bifurcation lesion of the second and      third marginal vessels as well as distal right coronary branch with a      small subsidiary branch to the posterior descending artery. These      vessels are very small in caliber and would not be well suited to      catheter-based intervention. Would recommend continued aggressive      medical therapy.           ______________________________  Peter M. Swaziland, M.D.     PMJ/MEDQ  D:  03/20/2005  T:  03/21/2005  Job:  [id number]   cc:   Cassell Clement, M.D.  Fax: [phone]

## 2010-05-19 NOTE — Op Note (Signed)
[place]. [facility]  Patient:    Hunter [name], Hunter [name]                     MRN: [id number] Proc. Date: 08/15/99 Attending:  Payton Doughty, M.D.                           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  OPERATION PERFORMED:  Anterior cervical diskectomy and fusion at C5-C6 with arthodesis with a tethered plate.  SURGEON:  Payton Doughty, M.D.  ANESTHESIA:  General endotracheal.  PREP:  Sterile Betadine prep and scrub with alcohol wipe.  COMPLICATIONS:  None.  DESCRIPTION OF PROCEDURE:  The patient is a 60 year old right-handed white male with a right C6 radiculopathy secondary to herniated disk.  The patient was taken to the operating room, smoothly anesthetized and intubated, and placed supine on the operating table in halter head traction with the neck slightly extended.  Following shave, prep and drape in the usual sterile fashion, a skin incision was made at the level of the carotid tubercle.  This was a transverse incision.  The platysma was identified, elevated, divided and undermined.  Dissection through the copious fat revealed the sternocleidomastoid.  Medial dissection revealed the carotid artery retracted laterally to the right, trachea and esophagus retracted laterally to the right exposing the bones of the anterior cervical spine.  Intraoperative x-ray was taken with the marker in place.  The marker was at C4-5.  Dissection was carried out at the level below that.  The longus colli were taken down bilaterally. Diskectomy was carried out at C5-C6 first under gross observation, then using the operating microscope.  There was a herniated disk through the posterior longitudinal ligament into the anterior epidural space compressing the right C6 nerve root.  This was removed without difficulty. The right C6 nerve root carefully explored and found to be free.  The left side  was explored and diskectomy carried out to the origin of the nerve root. There was no herniated disk on the left side.  The wound was irrigated and hemostasis assured.  An 8 mm bone graft was fashioned from patellar allograft and tapped into place.  An 18 mm tethered plate was then placed with 13 mm variable angle screws.  Following placement of the screws and plate, the intraoperative x-ray showed good placement of bone graft, plate and screws. The wound was irrigated and hemostasis assured.  The platysma was reapproximated with 3-0 Vicryl in interrupted fashion.  The subcutaneous tissues were reapproximated with 3-0 Vicryl suture in interrupted fashion. The subcuticular tissues were reapproximated with 3-0 Vicryl in interrupted fashion.  The skin was closed with 4-0 Vicryl in running subcuticular fashion. Benzoin and Steri-Strips were placed and made occlusive with Telfa and Op-Site.  The patient then returned to the recovery room in good condition. DD:  08/15/99 TD:  08/15/99 Job: [id number]

## 2010-05-19 NOTE — Discharge Summary (Signed)
[place]. [facility]  Patient:    Hunter [name], Hunter [name]                   MRN: [id number] Adm. Date:  [id number] Disc. Date: [id number] Attending:  Emeterio Reeve                           Discharge Summary  ADMITTING DIAGNOSIS: Airway compromise status post anterior cervical diskectomy.  DISCHARGE DIAGNOSIS: Airway compromise status post anterior cervical diskectomy.  OPERATION/PROCEDURE: None.  COMPLICATIONS: None.  DISCHARGE STATUS: Alive and well.  HISTORY OF PRESENT ILLNESS: The patient is a 60 year old right-handed white male, whose History and Physical is recounted on the chart.  On August 15, 1999 he had undergo anterior cervical diskectomy and fusion and had done well postoperatively, and was discharged the next day.  The night prior to admission he had some nausea and vomiting and difficulty breathing and he came by the office.  He was noted to be dyspneic and was admitted from the office.  PAST MEDICAL HISTORY: Hypertension.  PAST SURGICAL HISTORY:  1. Lumbar fusion.  2. Lumbar neurofibroma.  ALLERGIES: CODEINE.  PHYSICAL EXAMINATION:  GENERAL: The patient was awake and alert and oriented.  NECK: Soft, with slight incisional tenderness.  No palpable masses.  CHEST: Clear.  CARDIAC: Regular rate and rhythm.  CLINICAL IMPRESSION: Pharyngeal edema versus neck hematoma.  HOSPITAL COURSE: Films did not demonstrate any compressive pathology and once he was bedded down and had calmed down his breathing eased off.  Because of the concern he was observed in the hospital for several days.  He was given a bit of Decadron, which helped the swelling in his neck.  His oxygen saturations were 99-100% on room air.  He had some nausea and vomiting on a PCA and that was stopped.  He was switched back over to his oral Vicodin.  He developed some thrush, which was treated with Nystatin.  On discharge he was awake and alert and oriented, with  minimal difficulties.  He was discharged on August 22, 1999.  FOLLOW-UP: He is to follow up in the [facility] office in a week. DD:  10/06/99 TD:  10/07/99 Job: [id number]

## 2010-05-19 NOTE — Op Note (Signed)
NAME:  Hunter [name], Hunter [name] NO.:  [id number]   MEDICAL RECORD NO.:  [id number]                   PATIENT TYPE:  INP   LOCATION:  2312                                 FACILITY:  [facility]   PHYSICIAN:  Judie Petit, M.D.              DATE OF BIRTH:  [birth year]   DATE OF PROCEDURE:  04/28/2003  DATE OF DISCHARGE:                                 OPERATIVE REPORT   INDICATIONS:  [name] is a 60 year old male who presents for coronary  artery bypass grafting by Dr. Charlett Lango.  After coronary artery  bypass grafting, Dr. [name] requested transesophageal echocardiogram to  assess ventricular function.   DESCRIPTION OF PROCEDURE:  The patient, after coronary artery bypass  grafting, the TEE probe was lubricated and passed oropharyngeally for  imaging of the heart.   LEFT VENTRICLE:  The left ventricle revealed mild to moderate decrease in  ventricular function.  Mild global hypokinesis.  Papillary muscles were  outlined.  There were no masses noted within the left ventricular chamber.   MITRAL VALVE:  The mitral valve appeared to open and close appropriately.  There was no significant mitral regurgitant flow.   AORTIC VALVE:  The aortic valve appeared to be trileaflet in nature.  There  did not appear to be any obstruction to flow.  There was no significant  regurgitant flow.   RIGHT VENTRICLE:  Overall revealed grossly within normal limits.   LEFT ATRIUM:  There were no masses noted.  Atrial septum was intact.   The patient was subsequently removed from cardiopulmonary bypass and  subsequently ventricular function did improve somewhat.  The patient was  subsequently taken to the SICU in stable condition.                                               Judie Petit, M.D.    CE/MEDQ  D:  04/29/2003  T:  04/30/2003  Job:  [id number]

## 2010-05-19 NOTE — Discharge Summary (Signed)
[place]. [facility]  Patient:    Hunter [name], Hunter [name]                   MRN: [id number] Adm. Date:  [id number] Disc. Date: 08/16/99 Attending:  Emeterio Reeve                           Discharge Summary  ADMISSION DIAGNOSIS:  Herniated disc C5-C6.  DISCHARGE DIAGNOSIS:  Herniated disc C5-C6.  OPERATION/PROCEDURE:  Anterior cervical discectomy and fusion C5-C6 wiht a tether plate.  SURGEON:  Payton Doughty, M.D.  SERVICE:  Neurosurgery.  COMPLICATIONS: None.  DISCHARGE STATUS:  Alive and well.  HISTORY OF PRESENT ILLNESS:  A 60 year old right-handed white gentleman whose history and physical is recounted in the chart.  He has had a history of neck and arm pain.  He has had a disc done from behind by Izell Carrollton. Elesa Hacker, M.D., numerous years ago.  He has also had neurofibroma, ___________ of his spinal cord and a lumbar fusion.  PHYSICAL EXAMINATION:  General examination is unremarkable.  Neurologic examination is remarkable for a right C6 radiculopathy as well as lower extremity weakness which are not germane to the present hospitalization.  HOSPITAL COURSE:  He was admitted after ascertaining normal laboratory values and underwent and anterior cervical discectomy and fusion at C5-C6. Postoperatively he has done well.  His strength is full.  He has a bit of interscapular pain.  His incision is dry and well-healing.  His pain is controlled on Percocet.  DISPOSITION:  He is being discharged to home in the care of his family.  FOLLOW-UP:  He will follow up in the [facility] office in two weeks with a lateral C-spine x-ray. DD:  08/16/99 TD:  08/17/99 Job: [id number]

## 2010-05-19 NOTE — Op Note (Signed)
NAME:  Hunter [name], Hunter [name]                      ACCOUNT NO.:  [id number]   MEDICAL RECORD NO.:  [id number]                   PATIENT TYPE:  AMB   LOCATION:  DAY                                  FACILITY:  [facility]   PHYSICIAN:  Thornton Park. Daphine Deutscher, M.D.             DATE OF BIRTH:  Feb 10, 1950   DATE OF PROCEDURE:  01/13/2002  DATE OF DISCHARGE:                                 OPERATIVE REPORT   CCS#:  [id number]   PREOPERATIVE DIAGNOSES:  Small gallbladder chocked full of stones.   POSTOPERATIVE DIAGNOSES:  Tiny gallbladder with stones status post  laparoscopic cholecystectomy and intraoperative cholangiogram.   SURGEON:  Thornton Park. Daphine Deutscher, M.D.   ASSISTANT:  Vikki Ports, M.D.   ANESTHESIA:  General endotracheal.   DESCRIPTION OF PROCEDURE:  [name] is a 60 year old gentleman who was  taken to room one and given general anesthesia. The abdomen was prepped with  Betadine and draped sterilely. A longitudinal incision was made into the  umbilicus where a small hernia was entered and the fascia was opened and  then through a pursestring suture the Hasson cannula was placed. The abdomen  was insufflated and three trocars were placed in the upper abdomen. The  gallbladder was barely visualized and it was so tiny. It was grasped and  elevated. I dissected free Calot's triangle and put a clip upon the  gallbladder and with some difficulty was able to introduce the Reddick  catheter. A dynamic cholangiogram was performed which showed a long skinny  little cystic duct with good filling of the large common duct and  intrahepatic ducts and prompt flow into the duodenum. The cystic duct was  then triple clipped divided, cystic artery was clipped and divided and the  gallbladder was removed from the gallbladder bed with the electrocautery. It  was brought out through the umbilicus without difficulty. The gallbladder  bed was inspected, no bleeding was noted. At the top of the  gallbladder, I  did encounter another little vessel which I double clipped. I did not see  any evidence on the cholangiogram that this was a duct of  Luschka although  it could have  been a Luschka. The umbilical port was tied died and closed. The ports were  reclosed with 4-0 Vicryl, Benzoin and Steri-Strips. The patient seemed to  tolerate the procedure well and was taken to the recovery room in  satisfactory condition and will be admitted for overnight observation.                                               Thornton Park Daphine Deutscher, M.D.    MBM/MEDQ  D:  01/13/2002  T:  01/13/2002  Job:  [id number]   cc:   Barry Dienes. Eloise Harman, M.D.  [address]  [place]  [place] [address]  Fax: [phone]   Barbette Hair. Arlyce Dice, M.D. [facility]  [address]  [place]  [place] [address]  Fax: [phone]

## 2010-05-19 NOTE — Cardiovascular Report (Signed)
NAME:  Hunter [name], Hunter [name] NO.:  [id number]   MEDICAL RECORD NO.:  [id number]                   PATIENT TYPE:  INP   LOCATION:  4736                                 FACILITY:  [facility]   PHYSICIAN:  Peter M. Swaziland, M.D.               DATE OF BIRTH:  May 08, 1950   DATE OF PROCEDURE:  04/22/2003  DATE OF DISCHARGE:                              CARDIAC CATHETERIZATION   PROCEDURES PERFORMED:   CARDIOLOGIST:  Peter M. Swaziland, M.D.   INDICATIONS FOR PROCEDURE:  The patient is a 60 year old white male with a  history of tobacco abuse, hypertension and hyperlipidemia. He is status post  acute inferior myocardial infarction in February 2005 and underwent emergent  stenting of the distal right coronary artery.  He now presents with  recurrent anginal symptoms and had an abnormal Cardiolite stress study.   ACCESS:  Access is via the left femoral artery using standard Seldinger  technique.   EQUIPMENT:  Six French 4 cm right and left Judkins catheters.  Six French  pigtail catheter.  Six French arterial sheath.   MEDICATIONS:  Local anesthesia with 1% Xylocaine and Versed 2 mg IV.   CONTRAST MATERIAL:  One-hundred-ten milliliters Omnipaque.   HEMODYNAMIC DATA:  Aortic pressure is 138/70 with a mean of 96 mmHg.  Left  ventricular pressure 141 with an EDP of 13 mmHg.   ANGIOGRAPHIC DATA:  Left Coronary Artery:  The left coronary artery rises  and distributes normally.   Left Main Coronary Artery:  The left main coronary artery is short without  significant disease.   Left Anterior Descending Artery:  The left anterior descending artery has an  eccentric 80% stenosis proximally.  The mid LAD is diffusely diseased up to  70-80%.   Left Circumflex Coronary Artery:  The left circumflex coronary artery gives  rise to three marginal branches.  The first marginal branch has a 90%  stenosis at its takeoff.  The second marginal vessel has a 95% stenosis at  its  takeoff.  The ongoing third marginal branch has a 50% narrowing.   Right Coronary Artery:  The right coronary artery is a dominant vessel.  It  is diffusely diseased throughout the proximal and midvessel to 50%.  In the  midvessel there is a more focal severe  stenosis, up to 80%.  The distal  right coronary artery is still widely patent at the stent site.  There is a  50% stenosis in the proximal PDA.   Left Ventricular Angiography:  Left ventricular angiography was performed in  the RAO view.  This demonstrates normal left ventricular size and  contractility with minimal inferior basal hypokinesia.  Ejection fraction is  estimated at 55%.  There is no mitral regurgitation or prolapse.   FINAL INTERPRETATION:  1. Severe three-vessel obstructive atherosclerotic coronary artery disease     with diffuse disease.  2. Continued patency of the distal right coronary artery stent.  3. Normal left ventricular function.   PLAN:  Given the extent of his coronary disease would recommend coronary  artery bypass surgery and will consult CVTS.                                               Peter M. Swaziland, M.D.    PMJ/MEDQ  D:  04/22/2003  T:  04/24/2003  Job:  [id number]   cc:   Cassell Clement, M.D.  [address]  [place]  [place] [address]  Fax: [phone]   Barry Dienes. Eloise Harman, M.D.  [address]  [place]  [place] [address]  Fax: [phone]
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
## 2010-05-19 NOTE — H&P (Signed)
[place]  Patient:    Hunter [name], Hunter [name]                     MRN: [id number] Adm. Date:  08/15/99 Attending:  Payton Doughty, M.D.                         History and Physical  ADMISSION DIAGNOSIS:  Herniated disk, C5-C6.  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  He has had history of neck and right shoulder pain and in the remote past has had a posterior diskectomy done by Dr. Elesa Hacker.  An MRI shows disk osteophyte with right cord and C6 nerve root compression.  He is admitted for an anterior cervical diskectomy and fusion.  PAST MEDICAL HISTORY:  Remarkable for back pain from the neurofibromas as noted before.  MEDICATIONS:  He is on intermittent Vicodin.  ALLERGIES:  MORPHINE and he gets nauseated with CODEINE.  FAMILY HISTORY:  Both parents are deceased and their histories are unknown.  SOCIAL HISTORY:  He smokes 1/2 pack of cigarettes a day and drinks alcohol on a social basis and is on disability.  PHYSICAL EXAMINATION:  HEENT:  Within normal limits.  NECK:  Good range of motion.  CHEST:  Clear.  CARDIAC:  Regular rate and rhythm.  ABDOMEN:  Nontender.  No hepatosplenomegaly.  EXTREMITIES:  Without clubbing or cyanosis.  Peripheral pulses are good.  GENITOURINARY:  Deferred.  NEUROLOGIC:  He is awake, alert and oriented.  Cranial nerves are intact. Motor exam demonstrates 5/5 strength throughout the upper extremities, except for the right biceps which is 4/5.  Right biceps reflex is absent, and the rest are 1 in the upper extremities.  Lower extremities:  He has dorsiflexion and plantar flexion weakness of both lower extremities, worse on the left than on the right, and walks with bilateral AFOs.  CLINICAL IMPRESSION:   Right C6 radiculopathy secondary to disks at C5-C6.  PLAN:  Anterior cervical diskectomy and fusion at C5-C6.  The risks and benefits of this approach have been discussed with him, and he wishes to proceed. DD:  08/15/99 TD:  08/15/99 Job: [id number]

## 2010-05-19 NOTE — Op Note (Signed)
NAME:  Hunter [name], Hunter [name]                      ACCOUNT NO.:  [id number]   MEDICAL RECORD NO.:  [id number]                   PATIENT TYPE:  INP   LOCATION:  2312                                 FACILITY:  [facility]   PHYSICIAN:  Salvatore Decent. [name], M.D.         DATE OF BIRTH:  [birth year]   DATE OF PROCEDURE:  04/28/2003  DATE OF DISCHARGE:                                 OPERATIVE REPORT   PREOPERATIVE DIAGNOSES:  Three-vessel coronary disease with angina.   POSTOPERATIVE DIAGNOSES:  Three-vessel coronary disease with angina.  Femoral artery pseudoaneurysm.   OPERATION PERFORMED:  Median sternotomy, extracorporeal circulation,  coronary artery bypass grafting times five (left internal mammary artery to  left anterior descending, sequential saphenous vein graft to distal right  coronary artery and posterior descending, saphenous vein graft to obtuse  marginal 1, saphenous vein graft to first diagonal), repair left femoral  artery pseudoaneurysm.   SURGEON:  Salvatore Decent. [name], M.D.   ASSISTANT:  Pecola Leisure, PA   ANESTHESIA:  General.   FINDINGS:  Vein small, fair quality, left femoral artery pseudoaneurysm  discovered vein harvest, mammary good quality, target small diffusely  diseased, fair quality.  Obtuse marginal 2 and 3 were too small to graft.   INDICATIONS FOR PROCEDURE:  Hunter [name] is a 60 year old gentleman who  presented with an acute myocardial infarction approximately two months prior  to this admission.  He had acute angioplasty of his right coronary.  He had  residual three-vessel but was initially treated medially.  He now presents  with persistent angina since the time of his myocardial infarction.  Repeat  catheterization of the stent is still patent but he has continued persistent  anginal symptoms.  The patient was referred for coronary artery bypass  grafting.  The indications, risks, benefits and alternatives were discussed  in detail with the  patient.  The patient had been on chronic Plavix  treatment prior to admission and so was delayed until the appropriate  waiting period of five to seven days off of Plavix before proceeding with  elective surgery.  Hunter [name] understood the risks of bypass surgery and  agreed to proceed.   DESCRIPTION OF PROCEDURE:  Hunter [name] was brought to the preop holding  area on April 28, 2003.  The anesthesia service placed lines to monitor  arterial, central venous and pulmonary arterial pressure.  Intravenous  antibiotics were administered.  The patient was taken to the operating room,  anesthetized and intubated.  Of note, he was noted to have elevated  pulmonary arterial pressures with a PA systolic in the mid-50s.  The chest,  abdomen and legs were prepped and draped in the usual fashion.   An incision was made over the medial aspect of the left leg at the level of  the knee.  The greater saphenous vein was identified there.  It was of very  small caliber.  An incision was made likewise on the right leg.  The vein  was larger caliber there and was harvested from the right thigh  endoscopically, however.  A median sternotomy was performed and the left  internal mammary artery was harvested using standard technique.  It was a  good quality vessel.  After harvesting the saphenous vein it was noted to be  very small in caliber and a portion of it was too small to use.  Therefore,  the vein was explored at the right ankle.  This again was too small to use  at that location.  Therefore, vein was harvested from the left thigh using a  bridged open technique.  An incision was made initially in the groin.  There  was extensive hematoma in the region and with dissection attempting to  locate the saphenous vein, a pseudoaneurysm was entered.  There was profuse  arterial bleeding which was controlled with digital pressure.  The incision  was extended cephalad and control was obtained on the femoral  artery.  The  dissection then was carried down to the femoral artery.  There were two  holes in the artery which were bleeding profusely from backbleeding despite  proximal control.  These were oversewn with 6-0 Prolene sutures with good  control.  The greater saphenous vein then was identified and again was  harvested using an open bridged technique.  The patient had been fully  heparinized during this portion of the procedure.   The pericardium was opened.  It had a low insertion on the ascending aorta.  The aorta was cannulated via concentric 2-0 Ethibond pledgeted pursestring  sutures after ensuring adequate ACT anticoagulation.  A dual stage venous  cannula was placed via pursestring suture in the right atrial appendage.  Cardiopulmonary bypass was instituted and the patient was cooled to 32  degrees Celsius.  The coronary arteries were inspected and anastomotic sites  were chosen.  Of note their first obtuse marginal was the only one large  enough to graft at a site that was reachable surgically.  The conduits were  inspected and cut to length.  A foam pad was placed in the pericardium to  insulate the heart.  A temperature probe was placed in the myocardial septum  and a cardioplegia cannula was placed in the ascending aorta.   The aorta was cross-clamped.  The left ventricle was emptied via the aortic  root vent.  Cardiac arrest then was achieved with a combination of cold  antegrade blood cardioplegia and topical iced saline.  After achieving a  complete diastolic arrest and a myocardial septal cooling to 9 degrees  Celsius, the following distal anastomoses were performed.   First a reversed saphenous vein graft was sequential to the distal right  coronary and the posterior descending.  The anastomosis was performed to the  distal right coronary just beyond the take off of the posterior descending. The vessel was a 1.5 mm good quality target at that sight.  The stent was   palpable just proximal to the site of the anastomosis.  The anastomosis was  performed side-to-side with a running 7-0 Prolene suture.  There was  excellent flow through this anastomosis.  The distal end was cut to length.  It was anastomosed end-to-side to the posterior descending.  The posterior  descending arose from the right coronary, had a short common  trunk in the  bifurcation stenosis and then bifurcated into two smaller branches.  It was  grafted into the larger of the two PDA branches.  This was  a 1 mm fair  quality target.  The anastomosis was performed with a running 7-0 Prolene  suture.  The anastomosis was probed proximally and distally.  There was good  flow through the graft.  Cardioplegia was administered.  There was good  hemostasis.   Next a reversed saphenous vein graft was placed end-to-side to the first  diagonal branch of the LAD.  This was a 1.5 mm vessel that quickly  bifurcated and only a 1 mm probe passed distally.  It was diffusely diseased  and a fair quality vessel.  The vein was small caliber, fair quality.  It  was anastomosed end-to-side with a running 7-0 Prolene suture.  Again, the  anastomosis was probed proximally and distally.  There was good flow through  the anastomosis.  Cardioplegia was administered and there was good  hemostasis.   Next a reversed saphenous vein graft was placed end-to-side to the first  obtuse marginal branch.  The obtuse marginal rose from a common trunk which  trifurcated into obtuse marginals 1, 2 and 3.  Obtuse marginals 2 and 3 were  less than 1 mm.  Obtuse marginal was a 1.3 mm vessel.  The anastomosis was  performed end-to-side with a running 7-0 Prolene suture.  The probe passed  easily proximally and distally.  After the completion of the anastomosis,  cardioplegia was administered.  There was good hemostasis and good flow.   Next, the left internal mammary artery was brought through a window in the  pericardium.   The distal end was spatulated and was anastomosed end-to-side  to the distal LAD.  The LAD was a 1.5 mm vessel but was diffusely diseased  although a probe did pass distally throughout its length.  The mammary was a  2 mm good quality conduit.  The anastomosis was performed end-to-side with a  running 8-0 Prolene suture.  At the completion of the mammary to LAD  anastomosis the bulldog clamps were removed from the mammary artery.  Immediate and rapid septal rewarming was noted.  The bulldog clamp was  replaced.  The mammary pedicle was tacked to the epicardial surface of the  heart with 6-0 Prolene sutures.  Additional cardioplegia was administered  both down the vein grafts and the aortic root.  The vein grafts were cut to  length.  The cardioplegia cannula then was removed from the ascending aorta  and the proximal vein graft anastomoses were performed to 4.0 mm punch aortotomies while the patient was being rewarmed.  At the completion of the  final proximal anastomosis, the patient was placed in Trendelenburg  position.  The aortic root was deaired prior to the removal of the aortic  crossclamp.  The total crossclamp time was 82 minutes.  The patient  initially fibrillated.  He responded time of admission single defibrillation  with 20 joules and was in sinus rhythm thereafter.   All proximal and distal anastomoses were inspected for hemostasis.  Epicardial pacing wires were placed on the right ventricle and right atrium.  Because of the patient's pulmonary arterial pressures, a low dose dopamine  drip was initiated as was a milrinone infusion.  When the patient had been  rewarmed to a core temperature of 37 degrees Celsius, he was weaned from  cardiopulmonary bypass.  A transesophageal echocardiography probe had been  passed again to assist with volume management due to the elevated pulmonary  arterial pressures.  This showed some minimal hypokinesis in the septum and  more significant  hypokinesis in the inferior wall.  Otherwise good left  ventricular function. There was no valvular pathology. The initial cardiac  index was low but responded appropriately to volume infusion.  The patient  briefly after coming off bypass before removing the cannulas had a drop in  his blood pressure.  Air was noted in a vein graft and aspirated from that  vein graft.  The patient responded to resuscitation and did not require  reinstitution of cardiopulmonary bypass.  The total bypass time was 135  minutes.  After that transient drop in blood pressure, the patient remained  hemodynamically stable although again, he had a slight decrease in cardiac  index after closure of the chest.  This again responded to appropriate  volume resuscitation measures.  The test dose of protamine was administered  and was well tolerated.  The atrial and aortic cannulae were removed.  The  remainder of the protamine was administered without incident.  The chest was  irrigated with 1L of warm normal saline containing 1 gm of vancomycin.  Hemostasis was achieved.  A left pleural and two mediastinal chest tubes  were placed through separate subcostal incisions.  The pericardium could not  be reapproximated.  The sternum was closed with heavy gauge interrupted  stainless steel wires.  The remainder of the incisions were closed in  standard fashion.  Subcuticular closures were used on the skin.  It was  noted that the patient had stable hemodynamics with the exception of a drop  in the cardiac index after closure of the chest.  This responded to  resuscitative measures appropriately.  The patient was transported from the  operating room to the surgical intensive care unit in critical but stable  condition.  All sponge, needle and instrument counts were correct at the end  of the procedure.                                               Salvatore Decent [name], M.D.   SCH/MEDQ  D:  04/28/2003  T:  04/29/2003   Job:  [id number]   cc:   Cassell Clement, M.D.  [address]  [place]  [place] [address]  Fax: [phone]

## 2010-05-19 NOTE — Discharge Summary (Signed)
NAMEKERBY, BORNER NO.:  [id number]   MEDICAL RECORD NO.:  [id number]          PATIENT TYPE:  INP   LOCATION:  3734                         FACILITY:  [facility]   PHYSICIAN:  Cassell Clement, M.D. DATE OF BIRTH:  [birth year]   DATE OF ADMISSION:  12/28/2003  DATE OF DISCHARGE:  12/30/2003                                 DISCHARGE SUMMARY   FINAL DIAGNOSIS:  1.  Chest pain, myocardial infarction ruled out.  2.  Coronary atherosclerosis.  3.  Chronic obstructive pulmonary disease.  4.  Status post aortic coronary bypass graft surgery  5.  Hyperlipidemia.   OPERATIONS PERFORMED:  Left heart cardiac catheterization December 29, 2003  by Dr. Lady Deutscher.   HISTORY:  This 60 year old gentleman was admitted as an emergency by Dr.  Waldon Reining for further evaluation of chest pain. He has a history of known  coronary artery disease. He had an acute inferior posterior myocardial  infarction in February 2005 and underwent stenting of the right coronary  artery at that time. He returned April 2005 after an adenosine Cardiolite  had demonstrated ischemia and he underwent subsequent coronary artery bypass  graft surgery with a left internal mammary graft to the LAD, a sequential  saphenous vein graft to the right coronary artery and posterior descending  artery, and a saphenous vein graft to the first obtuse marginal, and a  saphenous vein graft to the diagonal. Since then the patient had been doing  well until the evening of admission when he developed chest pain while  watching television. He reported that the pain was similar in quality to  that which had heralded his acute myocardial infarction. He took  nitroglycerin at home and subsequent nitroglycerin by EMS was had some  beneficial effect, but not totally relieving his chest pain. After arrival  in the emergency room and receiving treatment and oxygen chest pain received  improve further.   HOSPITAL COURSE:  The  patient was admitted to telemetry. His initial set of  markers revealed a CK-MB of 4.3 and a troponin of less than 0.05. Kidney  function was normal on admission and hemoglobin was 14.7. On the day after  admission his vital signs were stable and he was evaluated for possible  cardiac catheterization by Dr. Reyes Ivan. The catheterization took place on the  afternoon of December 28. He was found to have severe native vessel disease  and patent grafts and mild left ventricular dysfunction and moderate right  carotid and left vertebral disease. It was felt that he would need  aggressive medical management with risk factor modification. If chest pain  continued patient might need another nuclear study and if anterior wall  defect was noted he might need another injection of the LIMA from a left  brachial approach. The patient tolerated the procedure well. IV  nitroglycerin was stopped on the evening of December 28. He remained pain-  free. Plavix was started and he was able to be discharged improved on the  morning of December 30, 2003 to be followed closely in the office.   DISCHARGE MEDICATIONS:  1.  The patient is  to continue his present home medications.  2.  We are reducing his aspirin to just 81 mg daily.  3.  He is starting Plavix 75 mg one daily.   ACTIVITY:  He is to walk as tolerated.   DIET:  He will be on a low cholesterol diet.   WOUND CARE:  He was given instructions regarding wound care of the groin.   FOLLOW-UP:  He is to see Dr. Patty Sermons an office visit follow-up in two  weeks and he will call for appointment.   CONDITION ON DISCHARGE:  Improved.      TB/MEDQ  D:  03/02/2004  T:  03/02/2004  Job:  [id number]
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
## 2010-05-19 NOTE — H&P (Signed)
[facility]  Patient:    Hunter [name], Hunter [name]                   MRN: [id number] Adm. Date:  [id number] Attending:  Emeterio Reeve                         History and Physical  ADMISSION DIAGNOSIS:  Airway compromise.  HISTORY:  This 60 year old, right-handed white gentleman who had an anterior cervical diskectomy and fusion 48 hours ago, did well and went home yesterday. Over the night complained of some nausea without vomiting, but some retching and has had difficulty getting his breath.  He has had a C-spine film that showed some fullness, but no tracheal deviation.  His saturations are in the mid-90s on room air, but he is being admitted for observation.  PAST MEDICAL HISTORY:  Remarkable for hypertension.  PAST SURGICAL HISTORY:  L-spine fusion.  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  ALLERGIES:  CODEINE.  PHYSICAL EXAMINATION:  HEENT:  Within normal limits.  NECK:  Soft, slight incisional tenderness.  No palpable masses.  LUNGS:  He is not stridorous.  CHEST:  Clear.  CARDIAC:  A regular rate and rhythm.  NEUROLOGIC:  He is awake, alert, and oriented.  His cranial nerves are intact. Upper extremities have full strength.  Lower extremities are at baseline with dorsiflexor plantar weakness bilaterally.  CLINICAL IMPRESSION:  Pharyngeal edema versus neck hematoma.  I really do not feel he has a hematoma.  He has a very glabrous neck and the concern is that it is just swelling.  In the operating room I could make it worse.  He is going to be given Decadron and observed closely.  If he would have evidence of airway loss, obviously would need to be explored, but for right now he is stable.  He will just be observed.DD:  08/17/99 TD:  08/17/99 Job: [id number]

## 2010-05-19 NOTE — Op Note (Signed)
NAMEPETERSON, Hunter [name] NO.:  [id number]   MEDICAL RECORD NO.:  [id number]          PATIENT TYPE:  AMB   LOCATION:  DAY                          FACILITY:  [facility]   PHYSICIAN:  Maretta Bees. Vonita Moss, M.D.DATE OF BIRTH:  30-Jul-1950   DATE OF PROCEDURE:  12/26/2005  DATE OF DISCHARGE:                               OPERATIVE REPORT   PREOPERATIVE DIAGNOSES:  Erectile dysfunction and request for elective  sterilization.   POSTOPERATIVE DIAGNOSES:  Erectile dysfunction and request for elective  sterilization.   PROCEDURE PERFORMED:  Insertion of three-piece penile prosthesis and  bilateral vasectomy.   SURGEON:  Maretta Bees. Vonita Moss, M.D.   ANESTHESIA:  General.   INDICATIONS:  This 60 year old gentleman has had a long history of  erectile dysfunction and has been cleared by Dr. Cassell Clement for  penile implant surgery.  He has had a history of coronary disease.  He  has failed other attempts to correct his erectile dysfunction, and  because his sexual partner is still potentially at risk for pregnancy,  he requested bilateral vasectomy.  He was told to have postop  contraception until he gets the sperm count to zero.   PROCEDURE:  The patient is brought to the operating room and placed in  supine position.  After receiving IV gentamicin and IV Cipro, he had a  10-minute scrub and prepped and draped in the usual fashion.  A Penrose  drain was placed around the base of the penis and the penis injected and  distended with sterile saline with a 23-gauge butterfly needle.  A Foley  catheter was then inserted.   A penoscrotal incision was made and the corpora on each side were  dissected out and incisions made in each corpora, after which the  corpora were dilated to size 14 Hegar dilators.  His corporeal  measurements were 16.5 cm and were symmetrical bilaterally.  I elected  to put in 12-cm cylinders with 4.5-cm rear-tip extenders, and after  using the Furlow needle  passer brought out through the glans penis, each  prosthesis was placed in good position.   Also, a retropubic space was created on the right side with a  combination of blunt dissection and cautery just to the right of the  osseous pubis, and at this point, the bladder was decompressed and I was  able to easily, after some tedious dissection, open up the space  attached to the bone.  An adequate space was easily obtained to insert  the 65-mL reservoir, but we just filled it with 45 mL of solution.   The penile cylinders were then inflated and there were no kinks and  there was full appropriate erection at this time.  The connector tubing  between the pump and the cylinder in the retropubic space were then  connected in appropriate fashion, and because of some excess length of  the tubing from the pump to the penile prostheses, each was shortened  and reconnected with the tubing connectors and secured well.  At this  point, the prosthesis was again retested and it worked very nicely and  the corpora were  closed with interrupted 2-0 Vicryl.   I then performed a bilateral vasectomy by excising a 1-cm segment of vas  and coagulating the ligaments with electrocautery using the needle  electrode and each end of the vasa were secured with a metal Hemoclip.   The scrotal incision was then closed with an interrupted 3-0 chromic  catgut in the subcu  and then the skin was closed with running  horizontal 4-0 chromic catgut.  I should add during the course of  procedure, double antibiotic solution was utilized throughout the  procedure.  Unfortunately, there was a perforation made in my gloves and  appropriate irrigation and change of gloves was done promptly.   Sponge, needle, instrument count was correct.  There was minimal blood  loss and the incision was secured with collodion and a Coban placed on  the penis and Foley catheter connected to closed drainage.  He was taken  to the recovery  room in good condition.      Maretta Bees. Vonita Moss, M.D.  Electronically Signed     LJP/MEDQ  D:  12/26/2005  T:  12/26/2005  Job:  [id number]   cc:   Cassell Clement, M.D.  Fax: [phone]

## 2010-05-19 NOTE — Cardiovascular Report (Signed)
NAME:  Hunter, [name] NO.:  [id number]   MEDICAL RECORD NO.:  [id number]          PATIENT TYPE:  INP   LOCATION:  3734                         FACILITY:  [facility]   PHYSICIAN:  Elmore Guise., M.D.DATE OF BIRTH:  Aug 08, 1950   DATE OF PROCEDURE:  12/29/2003  DATE OF DISCHARGE:                              CARDIAC CATHETERIZATION   REFERRING PHYSICIAN:  Sheppard Penton. Stacie Acres, M.D.   INDICATIONS FOR PROCEDURE:  Unstable angina.   PROCEDURE:  Cardiac catheterization, coronary angiogram, left ventricular  angiogram, aortic root angiogram, right subclavian angiogram.   FINDINGS:  1.  Left main:  Short with no significant disease.  2.  LAD:  Proximal 60% stenosis followed by 100% mid obstruction.  On      injection of the left system, the mid and distal LAD is noted with a      milking action after the occlusion.  Milking was also noted in the area      of the first diagonal.  3.  Left circumflex:  Proximal vessel with mild luminal irregularities.      OM1:  There is an ostial and proximal 95% stenosis.  OM2:  A 60 to 70%      ostial  proximal stenosis.  4.  RCA:  Small vessel with high branching 100% occlusion in the mid vessel.  5.  SVG to distal RCA is patent with mild ostial and proximal tapering noted      in the vein graft.  The PDA and PLB is without any significant      obstructive disease after insertion of vein graft.  6.  SVG to diagonal was patent with mild proximal tapering of the vein graft      noted.  After filling the diagonal, vessel backfills the mid LAD, left      main with filling of the left circumflex and OM vessels.  The diagonal      was a small native vessel.  7.  SVG to the OM was patent with mild proximal tapering.  8.  Right subclavian had a proximal 20% stenosis at the ostium near the      right carotid.  The right carotid had approximately a 60% stenosis at      this ostium.  The RIMA was patent and ungrafted.  9.  Aortic arch shot  showed left carotid and left subclavian with minimal      disease.  Left subclavian had an anterior and proximal takeoff which was      noted.  Left subclavian was unable to be directly cannulated.  LIMA was      identified and patent with filling of the LAD noted on a nonselective      shot.  The left vertebral was also visualized with mild to moderate      ostial/proximal disease.  10. LVEF was 45%.  LVEDP was 18 mmHg.  There was global hypokinesis.   IMPRESSION:  1.  Obstructive native vessel disease.  2.  Patent grafts as described above.  3.  Mild left ventricular dysfunction with an ejection fraction of 45%,  global hypokinesis, left ventricular end diastolic pressure was 18 mmHg.  4.  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   PLAN:  Aggressive medical management and risk factor modification.  If chest  pain does continue, will continue nuclear study to evaluate the anterior  wall defect.  At that time, he may need a relook at his LIMA from a left  brachial approach.  The procedure was stopped secondary to dye load of  approximately 25 mL of contrast.       TWK/MEDQ  D:  12/29/2003  T:  12/29/2003  Job:  [id number]   cc:   Sheppard Penton. Stacie Acres, M.D.  [address]  [place] [address]  Fax: [phone]

## 2010-05-19 NOTE — Discharge Summary (Signed)
NAME:  Hunter [name], BANKE                      ACCOUNT NO.:  [id number]   MEDICAL RECORD NO.:  [id number]                   PATIENT TYPE:  INP   LOCATION:  3728                                 FACILITY:  [facility]   PHYSICIAN:  Cassell Clement, M.D.              DATE OF BIRTH:  [birth year]   DATE OF ADMISSION:  02/07/2003  DATE OF DISCHARGE:  02/12/2003                                 DISCHARGE SUMMARY   FINAL DIAGNOSES:  1. Acute inferoposterior myocardial infarction.  2. Coronary atherosclerosis.  3. Emphysema.  4. Tobacco use disorder.  5. Chronic disability secondary to previous back surgery with bilateral foot     drop.  6. Essential hypertension.   OPERATION PERFORMED:  Emergency left heart catheterization with stent to a  99% stenosis of the distal right coronary artery on February 07, 2003, by Dr.  Corliss Marcus.  The patient was also noted at that time to have tubular 80%  proximal lesion of his LAD and a 75 to 80% proximal stenosis of his obtuse  marginal of the circumflex.   Left ventricular function showed an ejection fraction of 55% with inferior  wall hypokinesis.   HISTORY OF PRESENT ILLNESS:  This is a 60 year old Caucasian male with  history of tobacco abuse and emphysema who has been experiencing  intermittent chest pain and left arm discomfort off and on.  He had a  Cardiolite stress test approximately six months earlier which was negative  for reversible ischemia.  Several days prior to admission he began having  more severe pain and nausea off and on and on the evening of February 07, 2003, had severe substernal chest pain with left arm radiation, nausea,  vomiting, profound diaphoresis and called EMS.  He presented to the  emergency room and was found to have an acute inferoposterior myocardial  infarction with hypotension and 10/10 chest pain. He was diaphoretic.  He  was admitted by Dr. Mayford Knife.  His physical examination showed that he was in  moderate  distress. There were no murmurs, rubs, or gallops heard.  Abdomen  was soft and nontender.  His initial CK-MB was 5.9, troponin less than 0.05.  Electrolytes were normal.   HOSPITAL COURSE:  The patient was admitted to the CCU and was taken to the  catheterization lab emergently by Dr. Corliss Marcus.  He was begun on  Integrilin and heparin, aspirin and Plavix at the time of presentation.  The  catheterization showed results as noted above and he underwent successful  PTCA and stenting of the distal right coronary artery stenosis of 60%.  He  had some proximal 50% diffuse disease in the right coronary artery as well  as disease in the circumflex and LAD.  Post catheterization, the patient did  well.  Blood pressure stabilized and we were able to add an ACE inhibitor.  He was also placed on statin therapy.  He was seen by the tobacco  abuse  nurse who gave him advise in terms of stopping smoking.  On February 09, 2003, she was having no further ischemic heart pain but was having some  bilateral rib pain with coughing.  Rhythm remained normal sinus rhythm with  occasional runs of nonsustained ventricular tachycardia.  His Lopressor was  increased.  Enzymes by this time had shown marked elevation and were  trending downward.  Activity was gradually increased and he was referred to  cardiac rehab.  He tolerated the higher dose of Lopressor and was also  tolerating the addition of Altace for ACE inhibition.  Blood pressure  remained in the 108/64 range on that regimen.  The patient was having  difficulty voiding and Ditropan was added to his regimen which he tolerated.  The patient underwent a 2-dimensional echocardiogram  which was satisfactory  with a left ventricular ejection fraction of 40 to 50%, akinesis of the  periapical wall, hypokinesis of the mid distal inferior wall, and there were  no significant valvular lesions.  The left atrial size was normal and there  was a small pericardial  effusion circumferential to the heart but no  evidence of tamponade.  By February 12, 2003, the patient was stable enough  to be discharged home improved.   LABORATORY DATA:  His hemoglobin on admission was 17.7 consistent with  smoking.  Hemoglobin on discharge was 14.3.  Blood sugars ranged in the 100  to 159 range.  Liver function studies were slightly elevated and were  trending down at the time of discharge.  Potassium was normal.  Renal  function normal.  His peak CK was 2627, peak CK-MB 187, peak troponin was  66.48.  Lipid panel showed cholesterol 164, triglycerides 203, HDL 26, LDL  97.  TSH was 1.2.   DISCHARGE MEDICATIONS:  1. Altace 5 mg daily or Lisinopril 10 mg daily which he may have already on     hand.  2. Ecotrin 325 once a day.  3. Ditropan XL 10 mg at bedtime.  4. Lipitor 20 mg each evening.  5. Lopressor 50 mg taking half tablet twice a day.  6. Plavix 75 mg daily.  7. Nitroglycerin 1/150 sublingually p.r.n.  8. Imdur 60 mg half tablet each a.m.  9. Prevacid or Prilosec if needed for reflux symptoms.  10.      Tylenol p.r.n.   He is to walk as tolerated and his walking is limited because of his  previous disability regarding his back and legs.  He will be on a low  cholesterol diet.  He was counseled about not smoking.  He is not to drive a  car yet.  He will be in the cardiac rehab program.  He was seen in the  office in two weeks and he is to call for an appointment for office visit  and EKG.   CONDITION ON DISCHARGE:  Improved.                                                Cassell Clement, M.D.    TB/MEDQ  D:  03/16/2003  T:  03/18/2003  Job:  [id number]   cc:   Barry Dienes. Eloise Harman, M.D.  [address]  [place]  [place] [address]  Fax: [phone]   Francisca December, M.D.  [address]  [address]  [place]  [place] [address]  Fax: [phone]

## 2010-08-07 ENCOUNTER — Other Ambulatory Visit: Payer: Self-pay | Admitting: *Deleted

## 2010-08-07 DIAGNOSIS — I519 Heart disease, unspecified: Secondary | ICD-10-CM

## 2010-08-07 MED ORDER — CLOPIDOGREL BISULFATE 75 MG PO TABS
75.0000 mg | ORAL_TABLET | Freq: Every day | ORAL | Status: DC
Start: 2010-08-07 — End: 2011-08-03

## 2010-08-07 NOTE — Telephone Encounter (Signed)
Refilled generic plavix to [facility]

## 2010-09-05 ENCOUNTER — Other Ambulatory Visit: Payer: Self-pay | Admitting: *Deleted

## 2010-09-05 DIAGNOSIS — I10 Essential (primary) hypertension: Secondary | ICD-10-CM

## 2010-09-07 ENCOUNTER — Ambulatory Visit
Admission: RE | Admit: 2010-09-07 | Discharge: 2010-09-07 | Disposition: A | Discharge status: Home / Self Care | Payer: Medicare Other | Source: Ambulatory Visit | Attending: Nurse Practitioner | Admitting: Nurse Practitioner

## 2010-09-07 ENCOUNTER — Telehealth: Payer: Self-pay | Admitting: *Deleted

## 2010-09-07 ENCOUNTER — Ambulatory Visit (INDEPENDENT_AMBULATORY_CARE_PROVIDER_SITE_OTHER): Payer: Medicare Other | Admitting: Nurse Practitioner

## 2010-09-07 ENCOUNTER — Other Ambulatory Visit: Payer: Medicare Other | Admitting: *Deleted

## 2010-09-07 ENCOUNTER — Encounter: Payer: Self-pay | Admitting: Nurse Practitioner

## 2010-09-07 DIAGNOSIS — Z01810 Encounter for preprocedural cardiovascular examination: Secondary | ICD-10-CM

## 2010-09-07 DIAGNOSIS — I259 Chronic ischemic heart disease, unspecified: Secondary | ICD-10-CM

## 2010-09-07 DIAGNOSIS — I219 Acute myocardial infarction, unspecified: Secondary | ICD-10-CM

## 2010-09-07 LAB — CBC WITH DIFFERENTIAL/PLATELET
Basophils Absolute: 0 10*3/uL (ref 0.0–0.1)
Basophils Relative: 0.3 % (ref 0.0–3.0)
Eosinophils Absolute: 0.1 10*3/uL (ref 0.0–0.7)
Eosinophils Relative: 1.8 % (ref 0.0–5.0)
HCT: 41.5 % (ref 39.0–52.0)
Hemoglobin: 13.8 g/dL (ref 13.0–17.0)
Lymphocytes Relative: 21.1 % (ref 12.0–46.0)
Lymphs Abs: 1.3 10*3/uL (ref 0.7–4.0)
MCHC: 33.3 g/dL (ref 30.0–36.0)
MCV: 95.8 fl (ref 78.0–100.0)
Monocytes Absolute: 0.7 10*3/uL (ref 0.1–1.0)
Monocytes Relative: 11.3 % (ref 3.0–12.0)
Neutro Abs: 4 10*3/uL (ref 1.4–7.7)
Neutrophils Relative %: 65.5 % (ref 43.0–77.0)
Platelets: 197 10*3/uL (ref 150.0–400.0)
RBC: 4.33 Mil/uL (ref 4.22–5.81)
RDW: 12.8 % (ref 11.5–14.6)
WBC: 6.1 10*3/uL (ref 4.5–10.5)

## 2010-09-07 LAB — BASIC METABOLIC PANEL
BUN: 13 mg/dL (ref 6–23)
CO2: 27 mEq/L (ref 19–32)
Calcium: 9.2 mg/dL (ref 8.4–10.5)
Chloride: 103 mEq/L (ref 96–112)
Creatinine, Ser: 0.9 mg/dL (ref 0.4–1.5)
GFR: 92.65 mL/min (ref >60.00)
Glucose, Bld: 112 mg/dL — ABNORMAL HIGH (ref 70–99)
Potassium: 4.4 mEq/L (ref 3.5–5.1)
Sodium: 142 mEq/L (ref 135–145)

## 2010-09-07 LAB — APTT: aPTT: 29.4 s — ABNORMAL HIGH (ref 21.7–28.8)

## 2010-09-07 LAB — PROTIME-INR
INR: 0.9 ratio (ref 0.8–1.0)
Prothrombin Time: 10.5 s (ref 10.2–12.4)

## 2010-09-07 NOTE — Patient Instructions (Addendum)
We will arrange for a heart catheterization on Monday with Dr. Swaziland We are going to check your labs and an Xray today Go to [facility] on Monday at 9 am. Your procedure will be at 11AM 2nd Floor Short Stay at [facility] No food or drink after midnight on Sunday Stay on your medicines. You may take these on Monday with a sip of water except for your sugar pill. Do not take your Amaryl Monday am.   Angiography Angiography is a procedure used to look at the blood vessels (arteries) which carry the blood to different parts of your body. In this procedure a dye is injected through a catheter (a long, hollow tube about the size of a piece of cooked spaghetti) into an artery and x-rays are taken. The x-rays will show if there is a blockage or problem in a blood vessel.  PREPARATION FOR THE PROCEDURE  Let your caregiver know if you have had an allergy to dyes used in x-ray if you have ever had kidney problems or failure.   Do not eat or drink starting from midnight up to the time of the procedure, or as directed.   You may drink enough water to take your medications the morning of the procedure if you were instructed to do so.   You should be at the hospital or outpatient facility where the procedure is to be done 2 hours prior to the procedure or as directed.  PROCEDURE: 1. You may be given a medication to help you relax before and during the procedure through an IV in your hand or arm.  2. A local anesthetic to make the area numb may be used before inserting the catheter.  3. You will be prepared for the procedure by washing and shaving the area where the catheter will be inserted. This is usually done in the groin but may be done in the fold of your arm by your elbow.  4. A specially trained doctor will insert the catheter with a guide wire into an artery. This is guided under a special type of x-ray (fluoroscopy) to the blood vessel being examined.  5. Special dye is then injected and x-rays are  taken. These will show where any narrowing or blockages are located.  AFTER THE PROCEDURE  After the procedure you will be kept in bed for several hours.   The access site will be watched and you will be checked frequently.   Blood tests, other x-rays and an EKG may be done.   You may stay in the hospital overnight for observation.  SEEK IMMEDIATE MEDICAL CARE IF:  You develop chest pain, shortness of breath, feel faint, or pass out.   There is bleeding, swelling, or drainage from the catheter insertion site.   You develop pain, discoloration, coldness, or severe bruising in the leg or arm, or area where the catheter was inserted.   An oral temperature above 102 develops.  Document Released: 09/27/2004 Document Re-Released: 04/19/2007 [facility] Patient Information 2011 [place], [place].

## 2010-09-07 NOTE — Telephone Encounter (Signed)
Message copied by Eugenia Pancoast on Thu Sep 07, 2010  5:03 PM ------      Message from: Rosalio Macadamia      Created: Thu Sep 07, 2010  3:52 PM       Ok to report. Labs are satisfactory. Ok for cath on Monday.

## 2010-09-07 NOTE — Assessment & Plan Note (Signed)
His symptoms are progressing despite recent negative stress test. I have arranged for him to have cardiac cath with Dr. Swaziland. The procedure, risks and benefits are reviewed and he is willing to proceed. Procedure is scheduled for Monday. Patient is agreeable to this plan and will call if any problems develop in the interim.

## 2010-09-07 NOTE — Telephone Encounter (Signed)
Advised labs ok for cath on monday

## 2010-09-07 NOTE — Progress Notes (Signed)
Hunter [name] Date of Birth: 01-08-1950   History of Present Illness: [name] is seen today for a work in appointment. He is seen for Dr. Patty Sermons and Dr. Swaziland. He was at his regular visit with Dr. Eloise Harman about 2 weeks ago. He reported having more chest pain to him. He is using more sl NTG with prompt relief. It is now waking him up at night. Dr. Eloise Harman put him on a NTG patch but he had to stop due to severe headache. He has had some radiation to the left arm. He has had a recent myoview which was ok. He remains off of his cigarettes. He is very concerned. He says this is how he presented prior to his past MI. No nausea or diaphoresis reported. He did have new lateral T wave inversion on his last EKG with Dr. Patty Sermons and thus the [facility] was ordered.   Current Outpatient Prescriptions on File Prior to Visit  Medication Sig Dispense Refill  . Albuterol Sulfate (PROAIR HFA IN) Inhale into the lungs as needed.        [name] amLODipine (NORVASC) 5 MG tablet Take 5 mg by mouth daily.        [name] aspirin 81 MG tablet Take 81 mg by mouth daily.        [name] atorvastatin (LIPITOR) 20 MG tablet Take 20 mg by mouth daily.        . chlorpheniramine (CHLOR-TABLETS) 4 MG tablet Take 4 mg by mouth daily.        . clopidogrel (PLAVIX) 75 MG tablet Take 1 tablet (75 mg total) by mouth daily.  30 tablet  11  . Docusate Calcium (STOOL SOFTENER PO) Take by mouth daily.        [name] doxazosin (CARDURA) 2 MG tablet Take 2 mg by mouth daily.        [name] ezetimibe (ZETIA) 10 MG tablet Take 10 mg by mouth daily.        [name] glimepiride (AMARYL) 1 MG tablet Take 1 mg by mouth every morning before breakfast.        . mometasone (NASONEX) 50 MCG/ACT nasal spray 2 sprays by Nasal route as needed.        . nitroGLYCERIN (NITROSTAT) 0.4 MG SL tablet Place 1 tablet (0.4 mg total) under the tongue every 5 (five) minutes as needed.  100 tablet  3  . omeprazole (PRILOSEC) 20 MG capsule Take 20 mg by mouth daily.        [name]  oxybutynin (DITROPAN XL) 15 MG 24 hr tablet Take 15 mg by mouth daily.        . metoprolol (LOPRESSOR) 50 MG tablet Take 50 mg by mouth 2 (two) times daily.          Allergies  Allergen Reactions  . Altace     cough  . Codeine   . Lisinopril     cough    Past Medical History  Diagnosis Date  . CAD (coronary artery disease)     Remote MI with stent in 2005 and CABG in 2005  . COPD (chronic obstructive pulmonary disease)   . Diabetes mellitus   . History of myocardial infarction   . PAD (peripheral artery disease)     followed by Dr. Darrick Penna  . Coronary artery disease   . Myocardial infarction   . Hypertension   . S/P CABG (coronary artery bypass graft) 2005  . Normal nuclear stress test April 2012  . Tobacco  dependence in remission   . Increased liver enzymes     due to alcohol use    Past Surgical History  Procedure Date  . Eye surgery     BILATERAL TEAR DUCT  . Coronary artery bypass graft 2005    LIMA to LAD, SVG to RCA & PD, SVG to OM 1, SVG to 1st DX; Dr. [name]  . Epispadius correction   . Femur surgery     RIGHT  . Back surgery     FUSIONS  . Spinal tumor   . Laminectomy     History  Smoking status  . Former Smoker -- 0.5 packs/day for 30 years  . Types: Cigarettes  . Quit date: 01/01/2009  Smokeless tobacco  . Not on file    History  Alcohol Use  . Yes    Family History  Problem Relation Age of Onset  . Other Father     WORK ACCIDENT  . Heart attack Mother   . Other Brother     SUICIDE  . Coronary artery disease Sister     CABG  . Coronary artery disease Brother     Review of Systems: The review of systems is positive for chest pain. He has had more stress.  Alll other systems were reviewed and are negative.  Physical Exam: BP 120/88  Pulse 66  Wt 198 lb (89.812 kg) Patient is very pleasant and in no acute distress. He is in a wheelchair. Skin is warm and dry. Color is normal to [place].  HEENT is unremarkable.  Normocephalic/atraumatic. PERRL. Sclera are nonicteric. Neck is supple. No masses. No JVD. Lungs are clear. Cardiac exam shows a regular rate and rhythm. Abdomen is soft. Extremities are full without significant edema. Gait and ROM are intact. No gross neurologic deficits noted.   LABORATORY DATA: PENDING  Assessment / Plan:

## 2010-09-11 ENCOUNTER — Ambulatory Visit (HOSPITAL_COMMUNITY)
Admission: RE | Admit: 2010-09-11 | Discharge: 2010-09-11 | Disposition: A | Discharge status: Home / Self Care | Payer: Medicare Other | Source: Ambulatory Visit | Attending: Cardiology | Admitting: Cardiology

## 2010-09-11 DIAGNOSIS — I2510 Atherosclerotic heart disease of native coronary artery without angina pectoris: Secondary | ICD-10-CM

## 2010-09-11 DIAGNOSIS — Z951 Presence of aortocoronary bypass graft: Secondary | ICD-10-CM | POA: Insufficient documentation

## 2010-09-11 DIAGNOSIS — I252 Old myocardial infarction: Secondary | ICD-10-CM | POA: Insufficient documentation

## 2010-09-12 ENCOUNTER — Encounter: Payer: Self-pay | Admitting: *Deleted

## 2010-09-12 ENCOUNTER — Encounter: Payer: Self-pay | Admitting: Thoracic Diseases

## 2010-09-15 NOTE — Cardiovascular Report (Signed)
NAME:  Hunter [name], Hunter [name] NO.:  [id number]  MEDICAL RECORD NO.:  [id number]  LOCATION:  [facility]                         FACILITY:  [facility]  PHYSICIAN:  Alizea Pell M. [place], M.D.  DATE OF BIRTH:  09-29-1950  DATE OF PROCEDURE:  09/11/2010 DATE OF DISCHARGE:  09/11/2010                           CARDIAC CATHETERIZATION   INDICATIONS FOR PROCEDURE:  This is a 60 year old white male with history of inferior myocardial infarction in 2005.  He had stenting of the distal right coronary artery at that time.  He subsequently had coronary artery bypass surgery.  He has been experiencing increasing symptoms consistent with angina despite a recent stress nuclear study which was normal.  PROCEDURES:  Left heart catheterization, coronary and left ventricular angiography, saphenous vein graft angiography x3, and LIMA graft angiography.  ACCESS:  Via the left radial artery using standard Seldinger technique.  EQUIPMENT:  5-French 4-cm right Judkins catheter, 5-French 3.5-cm left Judkins catheter, 5-French LIMA catheter, 5-French pigtail catheter, 5- French arterial sheath.  MEDICATIONS:  Local anesthesia, 1% Xylocaine, Versed 2 mg IV, fentanyl 25 mcg IV.  CONTRAST:  110 mL of Omnipaque.  HEMODYNAMIC DATA:  Aortic pressure is 168/83 with a mean of 115 mmHg. Left ventricular pressure is 168 with EDP of 23 mmHg.  ANGIOGRAPHIC DATA:  The right coronary artery arises and distributes normally.  It is diffusely diseased throughout its whole mid segment up to 80%.  It is occluded at the crux.  The left coronary artery arises and distributes normally.  The left main coronary artery is short without significant disease.  Left anterior descending artery has a segmental 78% stenosis proximally and then 90-95% segmental disease in the midvessel.  The first diagonal branch has 80% disease proximally.  The left circumflex coronary artery demonstrates 30% narrowing in the midvessel.  The  first marginal branch is occluded.  The second and third obtuse marginal vessel branches were small-caliber vessels that bifurcate.  The second marginal branch has a 90% ostial stenosis and the third marginal branch has an 80% stenosis.  Saphenous vein graft to the posterior descending artery and posterolateral branches of the right coronary artery is widely patent.  Saphenous vein graft to the diagonal is patent.  The saphenous vein graft to the first obtuse marginal vessel is patent.  The LIMA graft to the LAD was difficult to directly cannulate, but was seen well on flush shots and was shown to be patent with good distal runoff.  Left ventricular angiography was performed in the RAO view.  This demonstrates normal left ventricular size and contractility with normal systolic function.  Ejection fraction is estimated 60%.  FINAL INTERPRETATION: 1. Severe three-vessel obstructive coronary artery disease. 2. All grafts are patent including saphenous vein graft to the     PDA/PLOM, saphenous vein graft to the diagonal, saphenous vein  graft to the first obtuse marginal vessel, and LIMA graft to LAD. 3. Normal left ventricular function.  PLAN:  Compared to his prior cardiac catheterization in 2007, there was no significant change.  He does have significant disease at the bifurcation of the second and third marginal branches, but these branches were quite small and would be difficult to get a good  result with percutaneous intervention.  I would recommend continued medical therapy.          ______________________________ Woodard Perrell M. [place], M.D.     PMJ/MEDQ  D:  09/11/2010  T:  09/12/2010  Job:  [id number]  cc:   Cassell Clement, M.D. Barry Dienes Eloise Harman, M.D.  Electronically Signed by Symphany Fleissner [place]and M.D. on 09/15/2010 05:12:12 PM

## 2010-10-09 ENCOUNTER — Other Ambulatory Visit: Payer: Self-pay | Admitting: *Deleted

## 2010-10-09 MED ORDER — AMLODIPINE BESYLATE 5 MG PO TABS: 5.0000 mg | ORAL_TABLET | Freq: Every day | ORAL | Status: DC

## 2010-10-26 ENCOUNTER — Encounter: Payer: Self-pay | Admitting: Thoracic Diseases

## 2010-10-26 ENCOUNTER — Ambulatory Visit (INDEPENDENT_AMBULATORY_CARE_PROVIDER_SITE_OTHER): Payer: Medicare Other | Admitting: Thoracic Diseases

## 2010-10-26 ENCOUNTER — Encounter (INDEPENDENT_AMBULATORY_CARE_PROVIDER_SITE_OTHER): Payer: Medicare Other

## 2010-10-26 VITALS — BP 163/61 | HR 69 | Resp 24 | Ht 66.0 in | Wt 196.5 lb

## 2010-10-26 DIAGNOSIS — I7092 Chronic total occlusion of artery of the extremities: Secondary | ICD-10-CM

## 2010-10-26 DIAGNOSIS — I70219 Atherosclerosis of native arteries of extremities with intermittent claudication, unspecified extremity: Secondary | ICD-10-CM

## 2010-10-26 NOTE — Progress Notes (Signed)
[facility] [place] HISTORY AND PHYSICAL -PAD ZO:[id number] PAD; stable, no intervention   History of Present Illness  Hunter [name] is a 60 y.o. male patient who presents with chief complaint of BLE PAD with claudication. He also has issues with his back and has some neurologic issues in his LE. Pt. states the pain in his legs /feet and numbness are unchanged and stable. He denies any non healing wounds in either LE. Pt. denies rest pain; denies night pain denies non healing ulcers on either Lower extremity.   Past Medical History  Diagnosis Date  . CAD (coronary artery disease)     Remote MI with stent in 2005 and CABG in 2005  . COPD (chronic obstructive pulmonary disease)   . Diabetes mellitus   . History of myocardial infarction   . PAD (peripheral artery disease)     followed by Dr. Darrick Penna  . Coronary artery disease   . Myocardial infarction   . Hypertension   . S/P CABG (coronary artery bypass graft) 2005  . Normal nuclear stress test April 2012  . Tobacco dependence in remission   . Increased liver enzymes     due to alcohol use  . Arthritis   . Occlusive disease, arterial     lower extremity    Social History History  Substance Use Topics  . Smoking status: Former Smoker -- 0.5 packs/day for 30 years    Types: Cigarettes    Quit date: 01/01/2009  . Smokeless tobacco: Not on file  . Alcohol Use: Yes    Family History Family History  Problem Relation Age of Onset  . Other Father     WORK ACCIDENT  . Heart attack Mother   . Heart disease Mother   . Other Brother     SUICIDE  . Heart disease Brother   . Coronary artery disease Sister     CABG  . Heart disease Sister   . Cancer Sister   . Coronary artery disease Brother      Allergies  Allergen Reactions  . Altace     cough  . Codeine   . Lisinopril     cough    Current outpatient prescriptions:Albuterol Sulfate (PROAIR HFA IN), Inhale into the lungs as needed.  ,  Disp: , Rfl: ;  amLODipine (NORVASC) 5 MG tablet, Take 1 tablet (5 mg total) by mouth daily., Disp: 30 tablet, Rfl: 6;  aspirin 81 MG tablet, Take 81 mg by mouth daily.  , Disp: , Rfl: ;  atorvastatin (LIPITOR) 20 MG tablet, Take 20 mg by mouth daily.  , Disp: , Rfl:  chlorpheniramine (CHLOR-TABLETS) 4 MG tablet, Take 4 mg by mouth daily.  , Disp: , Rfl: ;  clopidogrel (PLAVIX) 75 MG tablet, Take 1 tablet (75 mg total) by mouth daily., Disp: 30 tablet, Rfl: 11;  Docusate Calcium (STOOL SOFTENER PO), Take by mouth daily.  , Disp: , Rfl: ;  doxazosin (CARDURA) 2 MG tablet, Take 2 mg by mouth daily.  , Disp: , Rfl: ;  ezetimibe (ZETIA) 10 MG tablet, Take 10 mg by mouth daily.  , Disp: , Rfl:  glimepiride (AMARYL) 1 MG tablet, Take 1 mg by mouth every morning before breakfast.  , Disp: , Rfl: ;  ipratropium (ATROVENT) 0.02 % nebulizer solution, Take 500 mcg by nebulization as needed.  , Disp: , Rfl: ;  metoprolol (LOPRESSOR) 50 MG tablet, Take 50 mg by mouth 2 (two) times daily.  , Disp: ,  Rfl: ;  mometasone (NASONEX) 50 MCG/ACT nasal spray, 2 sprays by Nasal route as needed.  , Disp: , Rfl:  nitroGLYCERIN (NITROSTAT) 0.4 MG SL tablet, Place 1 tablet (0.4 mg total) under the tongue every 5 (five) minutes as needed., Disp: 100 tablet, Rfl: 3;  omeprazole (PRILOSEC) 20 MG capsule, Take 20 mg by mouth daily.  , Disp: , Rfl: ;  oxybutynin (DITROPAN XL) 15 MG 24 hr tablet, Take 15 mg by mouth daily.  , Disp: , Rfl:   Physical Examination  Filed Vitals:   10/26/10 1601  BP: 163/61  Pulse: 69  Resp: 24    Body mass index is 31.72 kg/(m^2).  General: A&O x 3, WDWN,  Gait: pt uses walker and has foot drop brace on LLE Eyes: PERRLA, Pulmonary: CTAB, without wheezes , rales or rhonchi VASCULAR EXAM: Extremities without ischemic changes without  Gangrene; without cellulitis; without open wounds;    Non-Invasive Vascular Imaging: DATE: 10/26/2010 ABI: RIGHT 0.65;  LEFT 0.56 - unchanged     ASSESSMENT: Hunter [name] is a 60 y.o. male who presents with: BLE PAD WITH Diabetic neuropathy and spinal cord inj in past contributing to his LE symptoms. These symptoms remain stable   PLAN: Based on the patient's vascular studies and examination, pt will return to clinic in 6 months for repeat ABI's. Discussed the importance of good foot care and reviewed  symptoms of worsening PAD   Clinic MD: CE Fields, MD

## 2010-11-07 ENCOUNTER — Other Ambulatory Visit: Payer: Self-pay | Admitting: *Deleted

## 2010-11-07 MED ORDER — EZETIMIBE 10 MG PO TABS: 10.0000 mg | ORAL_TABLET | Freq: Every day | ORAL | Status: DC

## 2010-11-07 MED ORDER — METOPROLOL TARTRATE 50 MG PO TABS: 50.0000 mg | ORAL_TABLET | Freq: Two times a day (BID) | ORAL | Status: DC

## 2010-12-04 ENCOUNTER — Other Ambulatory Visit: Payer: Self-pay | Admitting: *Deleted

## 2010-12-04 MED ORDER — EZETIMIBE 10 MG PO TABS: 10.0000 mg | ORAL_TABLET | Freq: Every day | ORAL | Status: DC

## 2010-12-04 MED ORDER — METOPROLOL TARTRATE 50 MG PO TABS: 50.0000 mg | ORAL_TABLET | Freq: Two times a day (BID) | ORAL | Status: DC

## 2010-12-04 NOTE — Telephone Encounter (Signed)
Refilled metoprolol 

## 2010-12-04 NOTE — Telephone Encounter (Signed)
Refilled zetia.

## 2010-12-13 ENCOUNTER — Encounter: Payer: Self-pay | Admitting: Cardiology

## 2010-12-13 ENCOUNTER — Ambulatory Visit (INDEPENDENT_AMBULATORY_CARE_PROVIDER_SITE_OTHER): Payer: Medicare Other | Admitting: Cardiology

## 2010-12-13 VITALS — BP 136/78 | HR 70 | Ht 66.0 in | Wt 198.0 lb

## 2010-12-13 DIAGNOSIS — F32A Depression, unspecified: Secondary | ICD-10-CM

## 2010-12-13 DIAGNOSIS — I219 Acute myocardial infarction, unspecified: Secondary | ICD-10-CM

## 2010-12-13 DIAGNOSIS — R079 Chest pain, unspecified: Secondary | ICD-10-CM

## 2010-12-13 DIAGNOSIS — E7800 Pure hypercholesterolemia, unspecified: Secondary | ICD-10-CM

## 2010-12-13 DIAGNOSIS — I119 Hypertensive heart disease without heart failure: Secondary | ICD-10-CM

## 2010-12-13 DIAGNOSIS — F329 Major depressive disorder, single episode, unspecified: Secondary | ICD-10-CM | POA: Insufficient documentation

## 2010-12-13 NOTE — Assessment & Plan Note (Signed)
The patient has not been expressing any dizzy spells or headaches.  Patient is sedentary.  He has not been experiencing any exertional dyspnea.

## 2010-12-13 NOTE — Assessment & Plan Note (Signed)
Patient has been experiencing some loss of interest generally in the things around him and particularly has lost interest in following sports on TV etc.  His symptoms are suggestive of possible depression.  We will ask him to followup with his primary care physician concerning this.

## 2010-12-13 NOTE — Patient Instructions (Signed)
Your physician recommends that you continue on your current medications as directed. Please refer to the Current Medication list given to you today.  Your physician wants you to follow-up in: 6 months. You will receive a reminder letter in the mail two months in advance. If you don't receive a letter, please call our office to schedule the follow-up appointment.  

## 2010-12-13 NOTE — Assessment & Plan Note (Signed)
The patient has not had any wound pain to suggest recurrent myocardial infarction.  He does have occasional mild angina pectoris with radiation to his left ear and left arm.  It responds promptly to nitroglycerin.  His cardiac catheterization in September 2012 was stable and showed all grafts to be patent

## 2010-12-13 NOTE — Progress Notes (Signed)
Hunter [name] Date of Birth:  12-15-1950 [facility] [address]   [address] [place], [place]  [address] [phone]           Fax   [phone]  History of Present Illness: This pleasant 60 year old gentleman seen for a six-month followup office visit.  As a history of known ischemic heart disease.  He had a past history of myocardial infarction with stent in 2005.  He underwent subsequent coronary artery bypass graft surgery also in 2005.  He had increasing chest discomfort in September 2012 and underwent cardiac catheterization by Dr. Peter Sw[place]iland on 09/12/2010 which showed normal left trigger systolic function and showed all grafts to be patent.  She has severe disease of his native circulation.  Patient has continued to have occasional chest discomfort radiating to left arm and up into the left head with nitroglycerin does help.  The episodes are not associated with nausea or vomiting.  Patient also has been experiencing some recent symptoms of loss of interest in sports and in doing things.  Current Outpatient Prescriptions  Medication Sig Dispense Refill  . Albuterol Sulfate (PROAIR HFA IN) Inhale into the lungs as needed.        [name] amLODipine (NORVASC) 5 MG tablet Take 1 tablet (5 mg total) by mouth daily.  30 tablet  6  . aspirin 81 MG tablet Take 81 mg by mouth daily.        [name] atorvastatin (LIPITOR) 20 MG tablet Take 10 mg by mouth daily.       . clopidogrel (PLAVIX) 75 MG tablet Take 1 tablet (75 mg total) by mouth daily.  30 tablet  11  . Docusate Calcium (STOOL SOFTENER PO) Take by mouth daily.        [name] doxazosin (CARDURA) 2 MG tablet Take 2 mg by mouth daily.        [name] ezetimibe (ZETIA) 10 MG tablet Take 1 tablet (10 mg total) by mouth daily.  30 tablet  11  . glimepiride (AMARYL) 1 MG tablet Take 4 mg by mouth daily before breakfast.       . ipratropium (ATROVENT) 0.02 % nebulizer solution Take 500 mcg by nebulization as needed.        .  metoprolol (LOPRESSOR) 50 MG tablet Take 1 tablet (50 mg total) by mouth 2 (two) times daily.  60 tablet  11  . mometasone (NASONEX) 50 MCG/ACT nasal spray 2 sprays by Nasal route as needed.        . nitroGLYCERIN (NITROSTAT) 0.4 MG SL tablet Place 1 tablet (0.4 mg total) under the tongue every 5 (five) minutes as needed.  100 tablet  3  . omeprazole (PRILOSEC) 20 MG capsule Take 20 mg by mouth daily.        [name] oxybutynin (DITROPAN XL) 15 MG 24 hr tablet Take 15 mg by mouth daily.        . chlorpheniramine (CHLOR-TABLETS) 4 MG tablet Take 4 mg by mouth daily.          Allergies  Allergen Reactions  . Altace     cough  . Codeine   . Lisinopril     cough    Patient Active Problem List  Diagnoses  . MYOCARDIAL INFARCTION  . ALLERGIC RHINITIS  . EMPHYSEMA  . DYSPNEA  . Chronic ischemic heart disease  . COPD (chronic obstructive pulmonary disease)  . Diabetes mellitus  . PAD (peripheral artery disease)  . Chest pain radiating  to arm  . Benign hypertensive heart disease without heart failure  . Spinal cord tumor    History  Smoking status  . Former Smoker -- 0.5 packs/day for 30 years  . Types: Cigarettes  . Quit date: 01/01/2009  Smokeless tobacco  . Not on file    History  Alcohol Use  . Yes    Family History  Problem Relation Age of Onset  . Other Father     WORK ACCIDENT  . Heart attack Mother   . Heart disease Mother   . Other Brother     SUICIDE  . Heart disease Brother   . Coronary artery disease Sister     CABG  . Heart disease Sister   . Cancer Sister   . Coronary artery disease Brother     Review of Systems: Constitutional: no fever chills diaphoresis or fatigue or change in weight.  Head and neck: no hearing loss, no epistaxis, no photophobia or visual disturbance. Respiratory: No cough, shortness of breath or wheezing. Cardiovascular: No chest pain peripheral edema, palpitations. Gastrointestinal: No abdominal distention, no abdominal pain, no  change in bowel habits hematochezia or melena. Genitourinary: No dysuria, no frequency, no urgency, no nocturia. Musculoskeletal:No arthralgias, no back pain, no gait disturbance or myalgias. Neurological: No dizziness, no headaches, no numbness, no seizures, no syncope, no weakness, no tremors. Hematologic: No lymphadenopathy, no easy bruising. Psychiatric: No confusion, no hallucinations, no sleep disturbance.    Physical Exam: Filed Vitals:   12/13/10 1604  BP: 136/78  Pulse: 70   the general appearance reveals a overweight gentleman who is in a motorized wheelchair.Pupils equal and reactive.   Extraocular Movements are full.  There is no scleral icterus.  The mouth and pharynx are normal.  The neck is supple.  The carotids reveal no bruits.  The jugular venous pressure is normal.  The thyroid is not enlarged.  There is no lymphadenopathy.  The chest is clear to percussion and auscultation. There are no rales or rhonchi. Expansion of the chest is symmetrical.  The precordium is quiet.  The first heart sound is normal.  The second heart sound is physiologically split.  There is no murmur gallop rub or click.  There is no abnormal lift or heave.  Abdomen is obese and nontender.  No hepatosplenomegaly.  Bowel sounds are active.  Extremities show mild edema.  No phlebitis theThe skin is warm and dry.  There is no rash.    Assessment / Plan: Continue same medication recheck in 6 months for followup office visit and EKG

## 2011-04-26 ENCOUNTER — Ambulatory Visit: Payer: Medicare Other

## 2011-04-26 ENCOUNTER — Encounter (INDEPENDENT_AMBULATORY_CARE_PROVIDER_SITE_OTHER): Payer: Medicare Other | Admitting: *Deleted

## 2011-04-26 DIAGNOSIS — I7092 Chronic total occlusion of artery of the extremities: Secondary | ICD-10-CM

## 2011-04-30 ENCOUNTER — Other Ambulatory Visit: Payer: Self-pay | Admitting: Cardiology

## 2011-05-04 ENCOUNTER — Other Ambulatory Visit: Payer: Self-pay | Admitting: *Deleted

## 2011-05-04 DIAGNOSIS — I739 Peripheral vascular disease, unspecified: Secondary | ICD-10-CM

## 2011-05-07 ENCOUNTER — Encounter: Payer: Self-pay | Admitting: Vascular Surgery

## 2011-06-01 ENCOUNTER — Encounter: Payer: Self-pay | Admitting: Cardiology

## 2011-06-01 ENCOUNTER — Ambulatory Visit (INDEPENDENT_AMBULATORY_CARE_PROVIDER_SITE_OTHER): Payer: Medicare Other | Admitting: Cardiology

## 2011-06-01 VITALS — BP 145/68 | HR 72 | Resp 18 | Ht 66.0 in | Wt 195.4 lb

## 2011-06-01 DIAGNOSIS — R059 Cough, unspecified: Secondary | ICD-10-CM

## 2011-06-01 DIAGNOSIS — I119 Hypertensive heart disease without heart failure: Secondary | ICD-10-CM

## 2011-06-01 DIAGNOSIS — I2510 Atherosclerotic heart disease of native coronary artery without angina pectoris: Secondary | ICD-10-CM

## 2011-06-01 DIAGNOSIS — I219 Acute myocardial infarction, unspecified: Secondary | ICD-10-CM

## 2011-06-01 DIAGNOSIS — R05 Cough: Secondary | ICD-10-CM

## 2011-06-01 DIAGNOSIS — J449 Chronic obstructive pulmonary disease, unspecified: Secondary | ICD-10-CM

## 2011-06-01 DIAGNOSIS — I259 Chronic ischemic heart disease, unspecified: Secondary | ICD-10-CM

## 2011-06-01 NOTE — Assessment & Plan Note (Signed)
The patient has a history of COPD.  Of note is the fact that he has had 4 sisters who have died of either emphysema or lung cancer.  All of the tests were related to smoking.  The patient himself was a heavy smoker until 2 years ago unfortunately has been able to remain without smoking since then.

## 2011-06-01 NOTE — Assessment & Plan Note (Signed)
The patient has not been experiencing any dizzy spells or syncope.  Is not having any headaches.  Blood pressure has been remaining stable on current therapy.  He has a history of hypercholesterolemia which is followed by Dr. Eloise Harman

## 2011-06-01 NOTE — Assessment & Plan Note (Signed)
Patient has had no recent chest pain or need for sublingual nitroglycerin.  He remains physically active.  He gets around in a wheelchair

## 2011-06-01 NOTE — Patient Instructions (Signed)
Your physician recommends that you continue on your current medications as directed. Please refer to the Current Medication list given to you today.  Your physician wants you to follow-up in: 6 month. You will receive a reminder letter in the mail two months in advance. If you don't receive a letter, please call our office to schedule the follow-up appointment.  

## 2011-06-01 NOTE — Progress Notes (Signed)
[name] Date of Birth:  Dec 02, 1950 [facility] [address] [place], [place]  [address] [phone]         Fax   [phone]  History of Present Illness: This pleasant 61 year old gentleman is seen for a six-month followup office visit.  He has a history of known ischemic heart disease.  He had a myocardial infarction with stent in 2005.  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  He had increasing chest pain in September 2012 and underwent cardiac catheterization by Dr. Swaziland on 09/12/10 showed normal left ventricular systolic function and showed that all of his grafts were patent.  Patient does have occasional chest discomfort.  He has not had to take any recent sublingual nitroglycerin.  He quit smoking 2 years ago and this has helped his breathing and also his chest pain. Current Outpatient Prescriptions  Medication Sig Dispense Refill  . Albuterol Sulfate (PROAIR HFA IN) Inhale into the lungs as needed.        [name] amLODipine (NORVASC) 5 MG tablet TAKE 1 TABLET (5 MG TOTAL) BY MOUTH DAILY.  30 tablet  6  . aspirin 81 MG tablet Take 81 mg by mouth daily.        [name] atorvastatin (LIPITOR) 20 MG tablet Take 10 mg by mouth daily.       . chlorpheniramine (CHLOR-TABLETS) 4 MG tablet Take 4 mg by mouth daily.        . clopidogrel (PLAVIX) 75 MG tablet Take 1 tablet (75 mg total) by mouth daily.  30 tablet  11  . Docusate Calcium (STOOL SOFTENER PO) Take by mouth daily.        [name] doxazosin (CARDURA) 2 MG tablet Take 2 mg by mouth daily.        [name] ezetimibe (ZETIA) 10 MG tablet Take 1 tablet (10 mg total) by mouth daily.  30 tablet  11  . glimepiride (AMARYL) 1 MG tablet Take 4 mg by mouth daily before breakfast.       . metoprolol (LOPRESSOR) 50 MG tablet Take 1 tablet (50 mg total) by mouth 2 (two) times daily.  60 tablet  11  . mometasone (NASONEX) 50 MCG/ACT nasal spray 2 sprays by Nasal route as needed.        . nitroGLYCERIN  (NITROSTAT) 0.4 MG SL tablet Place 1 tablet (0.4 mg total) under the tongue every 5 (five) minutes as needed.  100 tablet  3  . omeprazole (PRILOSEC) 20 MG capsule Take 20 mg by mouth daily.        [name] oxybutynin (DITROPAN XL) 15 MG 24 hr tablet Take 15 mg by mouth daily.        [name] ipratropium (ATROVENT) 0.02 % nebulizer solution Take 500 mcg by nebulization as needed.        [name] DISCONTD: metoprolol (LOPRESSOR) 50 MG tablet TAKE 1 TABLET (50 MG TOTAL) BY MOUTH 2 (TWO) TIMES DAILY.  60 tablet  6    Allergies  Allergen Reactions  . Codeine   . Lisinopril     cough  . Ramipril     cough    Patient Active Problem List  Diagnoses  . MYOCARDIAL INFARCTION  . ALLERGIC RHINITIS  . EMPHYSEMA  . DYSPNEA  . Chronic ischemic heart disease  . COPD (chronic obstructive pulmonary disease)  . Diabetes mellitus  . PAD (peripheral artery disease)  . Chest pain radiating to arm  . Benign hypertensive heart disease without  heart failure  . Spinal cord tumor  . Depression    History  Smoking status  . Former Smoker -- 0.5 packs/day for 30 years  . Types: Cigarettes  . Quit date: 01/01/2009  Smokeless tobacco  . Not on file    History  Alcohol Use  . Yes    Family History  Problem Relation Age of Onset  . Other Father     WORK ACCIDENT  . Heart attack Mother   . Heart disease Mother   . Other Brother     SUICIDE  . Heart disease Brother   . Coronary artery disease Sister     CABG  . Heart disease Sister   . Cancer Sister   . Coronary artery disease Brother     Review of Systems: Constitutional: no fever chills diaphoresis or fatigue or change in weight.  Head and neck: no hearing loss, no epistaxis, no photophobia or visual disturbance. Respiratory: No cough, shortness of breath or wheezing. Cardiovascular: No chest pain peripheral edema, palpitations. Gastrointestinal: No abdominal distention, no abdominal pain, no change in bowel habits hematochezia or  melena. Genitourinary: No dysuria, no frequency, no urgency, no nocturia. Musculoskeletal:No arthralgias, no back pain, no gait disturbance or myalgias. Neurological: No dizziness, no headaches, no numbness, no seizures, no syncope, no weakness, no tremors. Hematologic: No lymphadenopathy, no easy bruising. Psychiatric: No confusion, no hallucinations, no sleep disturbance.    Physical Exam: Filed Vitals:   06/01/11 1452  BP: 145/68  Pulse: 72  Resp: 18   the general appearance reveals a well-developed well-nourished tanned gentleman in no distress.  He travels by motorized wheelchair.Pupils equal and reactive.   Extraocular Movements are full.  There is no scleral icterus.  The mouth and pharynx are normal.  The neck is supple.  The carotids reveal no bruits.  The jugular venous pressure is normal.  The thyroid is not enlarged.  There is no lymphadenopathy.  The chest is clear to percussion and auscultation. There are no rales or rhonchi. Expansion of the chest is symmetrical.  The precordium is quiet.  The first heart sound is normal.  The second heart sound is physiologically split.  There is no murmur gallop rub or click.  There is no abnormal lift or heave.  The abdomen is soft and nontender. Bowel sounds are normal. The liver and spleen are not enlarged. There Are no abdominal masses. There are no bruits.  Extremities show no phlebitis he has trace edema of the left lower extremity. The skin is warm and dry.  There is no rash.  EKG today shows normal sinus rhythm with nonspecific T-wave abnormalities but no ischemic changes  Assessment / Plan: Patient is doing well from the cardiac standpoint.  He'll continue on same medication.  Recheck in 6 months

## 2011-06-08 DIAGNOSIS — E1151 Type 2 diabetes mellitus with diabetic peripheral angiopathy without gangrene: Secondary | ICD-10-CM | POA: Insufficient documentation

## 2011-08-03 ENCOUNTER — Other Ambulatory Visit: Payer: Self-pay | Admitting: Cardiology

## 2011-11-01 ENCOUNTER — Ambulatory Visit: Payer: Medicare Other | Admitting: Neurosurgery

## 2011-11-14 ENCOUNTER — Encounter: Payer: Self-pay | Admitting: Neurosurgery

## 2011-11-15 ENCOUNTER — Encounter (INDEPENDENT_AMBULATORY_CARE_PROVIDER_SITE_OTHER): Payer: Medicare Other | Admitting: *Deleted

## 2011-11-15 ENCOUNTER — Encounter: Payer: Self-pay | Admitting: Neurosurgery

## 2011-11-15 ENCOUNTER — Ambulatory Visit (INDEPENDENT_AMBULATORY_CARE_PROVIDER_SITE_OTHER): Payer: Medicare Other | Admitting: Neurosurgery

## 2011-11-15 VITALS — BP 129/73 | HR 66 | Resp 16 | Ht 65.0 in | Wt 190.0 lb

## 2011-11-15 DIAGNOSIS — I739 Peripheral vascular disease, unspecified: Secondary | ICD-10-CM

## 2011-11-15 DIAGNOSIS — I7092 Chronic total occlusion of artery of the extremities: Secondary | ICD-10-CM

## 2011-11-15 NOTE — Progress Notes (Signed)
[facility] Office Note  CC: PVD surveillance Referring Physician: Fields  History of Present Illness: 61 year old male patient of Dr. Darrick Penna with no history of vascular intervention but does have a history of spinal cord injury. The patient denies any claudication as he does not ambulate, he has no rest pain or no open ulcerations on the lower extremities. The patient denies any new medical diagnoses or recent surgery.  Past Medical History  Diagnosis Date  . CAD (coronary artery disease)     Remote MI with stent in 2005 and CABG in 2005  . COPD (chronic obstructive pulmonary disease)   . Diabetes mellitus   . History of myocardial infarction   . PAD (peripheral artery disease)     followed by Dr. Darrick Penna  . Coronary artery disease   . Myocardial infarction   . Hypertension   . S/P CABG (coronary artery bypass graft) 2005  . Normal nuclear stress test April 2012  . Tobacco dependence in remission   . Increased liver enzymes     due to alcohol use  . Arthritis   . Occlusive disease, arterial     lower extremity    ROS: [x]  Positive   [ ]  Denies    General: [ ]  Weight loss, [ ]  Fever, [ ]  chills Neurologic: [ ]  Dizziness, [ ]  Blackouts, [ ]  Seizure [ ]  Stroke, [ ]  "Mini stroke", [ ]  Slurred speech, [ ]  Temporary blindness; [ ]  weakness in arms or legs, [ ]  Hoarseness Cardiac: [ ]  Chest pain/pressure, [ ]  Shortness of breath at rest [ ]  Shortness of breath with exertion, [ ]  Atrial fibrillation or irregular heartbeat Vascular: [ ]  Pain in legs with walking, [ ]  Pain in legs at rest, [ ]  Pain in legs at night,  [ ]  Non-healing ulcer, [ ]  Blood clot in vein/DVT,   Pulmonary: [ ]  Home oxygen, [ ]  Productive cough, [ ]  Coughing up blood, [ ]  Asthma,  [ ]  Wheezing Musculoskeletal:  [ ]  Arthritis, [ ]  Low back pain, [ ]  Joint pain Hematologic: [ ]  Easy Bruising, [ ]  Anemia; [ ]  Hepatitis Gastrointestinal: [ ]  Blood in stool, [ ]  Gastroesophageal  Reflux/heartburn, [ ]  Trouble swallowing Urinary: [ ]  chronic Kidney disease, [ ]  on HD - [ ]  MWF or [ ]  TTHS, [ ]  Burning with urination, [ ]  Difficulty urinating Skin: [ ]  Rashes, [ ]  Wounds Psychological: [ ]  Anxiety, [ ]  Depression   Social History History  Substance Use Topics  . Smoking status: Former Smoker -- 0.5 packs/day for 30 years    Types: Cigarettes    Quit date: 01/01/2009  . Smokeless tobacco: Not on file  . Alcohol Use: Yes    Family History Family History  Problem Relation Age of Onset  . Other Father     WORK ACCIDENT  . Heart attack Mother   . Heart disease Mother   . Other Brother     SUICIDE  . Heart disease Brother   . Coronary artery disease Sister     CABG  . Heart disease Sister   . Cancer Sister     Lung  . Coronary artery disease Brother   . COPD Sister     Allergies  Allergen Reactions  . Codeine Nausea And Vomiting  . Lisinopril     cough  . Ramipril     cough    Current Outpatient Prescriptions  Medication Sig Dispense Refill  .  Albuterol Sulfate (PROAIR HFA IN) Inhale into the lungs as needed.        [name] amLODipine (NORVASC) 5 MG tablet TAKE 1 TABLET (5 MG TOTAL) BY MOUTH DAILY.  30 tablet  6  . aspirin 81 MG tablet Take 81 mg by mouth daily.        [name] atorvastatin (LIPITOR) 20 MG tablet Take 10 mg by mouth daily.       . chlorpheniramine (CHLOR-TABLETS) 4 MG tablet Take 4 mg by mouth daily.        . clopidogrel (PLAVIX) 75 MG tablet TAKE 1 TABLET BY MOUTH EVERY DAY  30 tablet  11  . Docusate Calcium (STOOL SOFTENER PO) Take by mouth daily.        [name] doxazosin (CARDURA) 2 MG tablet Take 2 mg by mouth daily.        [name] ezetimibe (ZETIA) 10 MG tablet Take 1 tablet (10 mg total) by mouth daily.  30 tablet  11  . gabapentin (NEURONTIN) 300 MG capsule Take 300 mg by mouth 3 (three) times daily.      [name] glimepiride (AMARYL) 1 MG tablet Take 4 mg by mouth daily before breakfast.       . ipratropium (ATROVENT) 0.02 % nebulizer solution Take  500 mcg by nebulization as needed.        . metoprolol (LOPRESSOR) 50 MG tablet Take 1 tablet (50 mg total) by mouth 2 (two) times daily.  60 tablet  11  . mometasone (NASONEX) 50 MCG/ACT nasal spray 2 sprays by Nasal route as needed.        . nitroGLYCERIN (NITROSTAT) 0.4 MG SL tablet Place 1 tablet (0.4 mg total) under the tongue every 5 (five) minutes as needed.  100 tablet  3  . omeprazole (PRILOSEC) 20 MG capsule Take 20 mg by mouth daily.        . ONE TOUCH ULTRA TEST test strip daily.      Letta Pate DELICA LANCETS MISC daily.      [name] oxybutynin (DITROPAN XL) 15 MG 24 hr tablet Take 15 mg by mouth daily.          Physical Examination  Filed Vitals:   11/15/11 1520  BP: 129/73  Pulse: 66  Resp: 16    Body mass index is 31.62 kg/(m^2).  General:  WDWN in NAD Gait: Normal HEENT: WNL Eyes: Pupils equal Pulmonary: normal non-labored breathing , without Rales, rhonchi,  wheezing Cardiac: RRR, without  Murmurs, rubs or gallops; No carotid bruits Abdomen: soft, NT, no masses Skin: no rashes, ulcers noted Vascular Exam/Pulses: Lower extremity pulses are not palpable, femoral pulses are palpable bilaterally  Extremities without ischemic changes, no Gangrene , no cellulitis; no open wounds;  Musculoskeletal: no muscle wasting or atrophy  Neurologic: A&O X 3; Appropriate Affect ; SENSATION: normal; MOTOR FUNCTION:  moving all extremities equally. Speech is fluent/normal  Non-Invasive Vascular Imaging: ABIs today are 0.66 on the right, 0.59 on the left which is consistent with previous exam  ASSESSMENT/PLAN: Asymptomatic patient with a history spinal cord injury and diminished lower extremity sensation. The patient has requested followup in one year instead of six-month intervals. He has been stable for some time so we will schedule him for one year ABIs. The patient's questions were encouraged and answered, he is in agreement with this plan.  Lauree Chandler ANP  Clinic M.D.:  Fields

## 2011-11-16 NOTE — Addendum Note (Signed)
Addended by: Sharee Pimple on: 11/16/2011 08:32 AM   Modules accepted: Orders

## 2011-11-26 ENCOUNTER — Telehealth: Payer: Self-pay | Admitting: Pulmonary Disease

## 2011-11-26 ENCOUNTER — Ambulatory Visit (INDEPENDENT_AMBULATORY_CARE_PROVIDER_SITE_OTHER): Payer: Medicare Other | Admitting: Pulmonary Disease

## 2011-11-26 ENCOUNTER — Encounter: Payer: Self-pay | Admitting: Pulmonary Disease

## 2011-11-26 VITALS — BP 136/78 | HR 63 | Temp 97.8°F | Ht 65.0 in | Wt 195.0 lb

## 2011-11-26 DIAGNOSIS — J4489 Other specified chronic obstructive pulmonary disease: Secondary | ICD-10-CM

## 2011-11-26 DIAGNOSIS — R053 Chronic cough: Secondary | ICD-10-CM | POA: Insufficient documentation

## 2011-11-26 DIAGNOSIS — J449 Chronic obstructive pulmonary disease, unspecified: Secondary | ICD-10-CM

## 2011-11-26 DIAGNOSIS — R05 Cough: Secondary | ICD-10-CM | POA: Insufficient documentation

## 2011-11-26 DIAGNOSIS — R059 Cough, unspecified: Secondary | ICD-10-CM

## 2011-11-26 MED ORDER — OMEPRAZOLE 20 MG PO CPDR: 20.0000 mg | DELAYED_RELEASE_CAPSULE | Freq: Two times a day (BID) | ORAL | Status: DC

## 2011-11-26 NOTE — Assessment & Plan Note (Signed)
The patient's cough is clearly upper airway in origin, and by his description is consistent with cyclical coughing.  He is also had syncopal episodes associated with this.  It is unclear whether postnasal drip and reflux disease are contributing to this, but he is having a lot of throat clearing and describes a classic globus sensation.  At this point, we'll try to minimize all excessive stimulation to his upper airway, and we'll start him on the cyclical cough protocol for cough suppression.  He understands this will not "break the cycle", unless he sticks to the protocol to the letter.  He is to followup with me in one to 2 weeks to check on his progress.

## 2011-11-26 NOTE — Addendum Note (Signed)
Addended by: Nita Sells on: 11/26/2011 12:51 PM   Modules accepted: Orders

## 2011-11-26 NOTE — Telephone Encounter (Signed)
Spoke with [employer]-- she states she is weary about patient trying the Rx for tessalon perles/tussicaps because of his allergy.  Cappie also questions if these meds really work because they are expensive not to .  Pharmacist told her that the medication has enough codeine to cause pt to have his rxn.  Per Dr. Shelle Iron the patient needs to use these medications to help best improve/eliminate symptoms. If patient is weary about reaction he may try OTC Robitussin/cough syrup and as well as continue to follow the cyclical cough paper.    Spoke back with [employer] --per her--she will have pt try the meds prescribed by Dr. Shelle Iron. Nothing further needed at this time.

## 2011-11-26 NOTE — Progress Notes (Signed)
  Subjective:    Patient ID: Hunter [name], male    DOB: 05/21/1950, 61 y.o.   MRN: [id number]  HPI The patient is a 61 year old male who I've been asked to see for chronic cough.  I have seen him in the distant past for mild obstructive lung disease, but he has quit smoking and is doing better from that standpoint.  He notes a cough that started in April of this year, and thought it was either due to allergies or an upper respiratory infection.  The cough then became more of an upper airway issue, and he points to his throat where he feels the cough initiates.  It is completely dry, and he describes a classic globus sensation.  It has been getting progressively worse, and now he is having cough paroxysms with occasional cough syncope.  He describes a tickle in his throat that does not improve unless he coughs.  He is having postnasal drip, in the last few months his reflux disease has been a little worse.  He has also had a lot of throat clearing according to his family member.  The cough is worse with conversation, movement, and taking a deep breath.  He has had a chest x-ray last month that was clear.  He has been treated with antibiotics, including a macrolide.  He has also been treated with prednisone and various bronchodilators without improvement.  He is not really had worsening chest congestion or shortness of breath at this point.   Review of Systems  Constitutional: Negative for fever and unexpected weight change.  HENT: Positive for congestion, sneezing, trouble swallowing and voice change. Negative for ear pain, nosebleeds, sore throat, rhinorrhea, dental problem, postnasal drip and sinus pressure.   Eyes: Negative for redness and itching.  Respiratory: Positive for shortness of breath. Negative for cough, chest tightness and wheezing.   Cardiovascular: Positive for leg swelling. Negative for palpitations.  Gastrointestinal: Negative for nausea and vomiting.  Genitourinary: Negative for  dysuria.  Musculoskeletal: Negative for joint swelling.  Skin: Negative for rash.  Neurological: Positive for headaches.  Hematological: Does not bruise/bleed easily.  Psychiatric/Behavioral: Negative for dysphoric mood. The patient is not nervous/anxious.        Objective:   Physical Exam Constitutional:  Obese male, no acute distress  HENT:  Nares patent without discharge, +epistaxis on left with mucosal irritation  Oropharynx without exudate, palate and uvula are normal  Eyes:  Perrla, eomi, no scleral icterus  Neck:  No JVD, no TMG  Cardiovascular:  Normal rate, regular rhythm, no rubs or gallops.  No murmurs        Intact distal pulses  Pulmonary :  Normal breath sounds, no stridor or respiratory distress   No rales, rhonchi, or wheezing  Abdominal:  Soft, nondistended, bowel sounds present.  No tenderness noted.   Musculoskeletal:  mild lower extremity edema noted.  Lymph Nodes:  No cervical lymphadenopathy noted  Skin:  No cyanosis noted  Neurologic:  Alert, appropriate, moves all 4 extremities but weak.          Assessment & Plan:

## 2011-11-26 NOTE — Assessment & Plan Note (Signed)
This does not seem to be an issue for the pt currently.

## 2011-11-26 NOTE — Patient Instructions (Addendum)
Please follow cough protocol to the letter.  See handout. Stop your nasonex for now, it appears to be irritating your nose Stop zyrtec.  Take chlorpheniramine 4mg  every 12hrs until next visit. Increase omeprazole to am AND pm until next visit. Limit voice as much as possible, and no throat clearing. Use HARD CANDY, no mint or cough drops, to bathe the back of your throat  followup with me the end of next week.

## 2011-12-04 ENCOUNTER — Other Ambulatory Visit: Payer: Self-pay | Admitting: Cardiology

## 2011-12-04 MED ORDER — EZETIMIBE 10 MG PO TABS: 10.0000 mg | ORAL_TABLET | Freq: Every day | ORAL | Status: DC

## 2011-12-04 MED ORDER — AMLODIPINE BESYLATE 5 MG PO TABS: 5.0000 mg | ORAL_TABLET | Freq: Every day | ORAL | Status: DC

## 2011-12-04 MED ORDER — METOPROLOL TARTRATE 50 MG PO TABS: 50.0000 mg | ORAL_TABLET | Freq: Two times a day (BID) | ORAL | Status: DC

## 2011-12-05 ENCOUNTER — Other Ambulatory Visit: Payer: Self-pay

## 2011-12-05 MED ORDER — EZETIMIBE 10 MG PO TABS: 10.0000 mg | ORAL_TABLET | Freq: Every day | ORAL | Status: DC

## 2011-12-05 MED ORDER — METOPROLOL TARTRATE 50 MG PO TABS: 50.0000 mg | ORAL_TABLET | Freq: Two times a day (BID) | ORAL | Status: DC

## 2011-12-10 ENCOUNTER — Encounter: Payer: Self-pay | Admitting: Pulmonary Disease

## 2011-12-10 ENCOUNTER — Ambulatory Visit (INDEPENDENT_AMBULATORY_CARE_PROVIDER_SITE_OTHER): Payer: Medicare Other | Admitting: Pulmonary Disease

## 2011-12-10 VITALS — BP 128/82 | HR 63 | Temp 97.3°F | Ht 65.0 in | Wt 195.0 lb

## 2011-12-10 DIAGNOSIS — R059 Cough, unspecified: Secondary | ICD-10-CM

## 2011-12-10 DIAGNOSIS — R05 Cough: Secondary | ICD-10-CM

## 2011-12-10 DIAGNOSIS — R053 Chronic cough: Secondary | ICD-10-CM

## 2011-12-10 NOTE — Progress Notes (Signed)
  Subjective:    Patient ID: Hunter [name], male    DOB: Oct 04, 1950, 61 y.o.   MRN: [id number]  HPI The patient comes in today for followup of his chronic cough.at the last visit, he was felt to have cyclical coughing that was primarily upper airway in origin.  He was treated with cyclical cough protocol, and comes in today where he is 70% improved from the last visit.  However, the cough he does have has changed in character.  He is now having gurgling sounds with mucus, but is unclear if this is coming from his head with postnasal drip versus from his lower airways.  I would favor the former.  Previously, this was more of a dry hacking cough.   Review of Systems  Constitutional: Negative for fever and unexpected weight change.  HENT: Positive for congestion, rhinorrhea, trouble swallowing and postnasal drip. Negative for ear pain, nosebleeds, sore throat, sneezing, dental problem and sinus pressure.   Eyes: Negative for redness and itching.  Respiratory: Positive for cough, chest tightness, shortness of breath and wheezing.   Cardiovascular: Negative for palpitations and leg swelling.  Gastrointestinal: Negative for nausea and vomiting.  Genitourinary: Negative for dysuria.  Musculoskeletal: Negative for joint swelling.  Skin: Negative for rash.  Neurological: Negative for headaches.  Hematological: Does not bruise/bleed easily.  Psychiatric/Behavioral: Negative for dysphoric mood. The patient is not nervous/anxious.        Objective:   Physical Exam Obese male in no acute distress Nose without purulence or discharge noted Oropharynx with mucus collecting in the back of the throat, no exudates Neck without lymphadenopathy or thyromegaly Chest totally clear to auscultation with no wheezes or crackles, however there is very loud upper airway noise. Cardiac exam with regular rate and rhythm Lower extremities with mild edema, no cyanosis Alert and oriented, moves all 4  extremities.       Assessment & Plan:

## 2011-12-10 NOTE — Patient Instructions (Addendum)
Will do an xray of your sinuses, and will call you with the results and where to go from here. Continue with your voice rest, no throat clearing, hard candy to bathe back of throat, do not let yourself get into the coughing jags. Continue with chlorpheniramine at bedtime and at lunch to help with postnasal drip.

## 2011-12-10 NOTE — Assessment & Plan Note (Signed)
The patient has had significant improvement in his upper airway dry cough on the cyclical cough protocol.  I have stressed to him the importance of continuing with his behavioral therapies to prevent the cough from escalating again.  The cough he does have  sounds more wet, and I'm wondering if this is because of significant postnasal drip.  He is now having a lot more sinus symptoms, and I do think he would benefit from a scan of his sinuses.

## 2011-12-13 ENCOUNTER — Other Ambulatory Visit: Payer: Medicare Other

## 2011-12-14 ENCOUNTER — Ambulatory Visit (INDEPENDENT_AMBULATORY_CARE_PROVIDER_SITE_OTHER)
Admission: RE | Admit: 2011-12-14 | Discharge: 2011-12-14 | Disposition: A | Discharge status: Home / Self Care | Payer: Medicare Other | Source: Ambulatory Visit | Attending: Pulmonary Disease | Admitting: Pulmonary Disease

## 2011-12-14 DIAGNOSIS — R059 Cough, unspecified: Secondary | ICD-10-CM

## 2011-12-14 DIAGNOSIS — R053 Chronic cough: Secondary | ICD-10-CM

## 2011-12-14 DIAGNOSIS — R05 Cough: Secondary | ICD-10-CM

## 2011-12-17 ENCOUNTER — Telehealth: Payer: Self-pay | Admitting: Pulmonary Disease

## 2011-12-17 NOTE — Telephone Encounter (Signed)
Let pt know that he has been treated with multiple rounds of abx with persistent cough.  I am willing to treat him one more time with doxycycline, but I do not think an infection is the problem.  Discolored mucus can be inflammatory, and not even related to infection.   Ok to send in doxy 100mg  bid for 5 days.  He needs to continue with the behavioral therapies we discussed on a consistent basis.  Let us know if cough doesn't resolve.

## 2011-12-17 NOTE — Telephone Encounter (Signed)
Pt is aware of CT results. He states that he is still coughing and getting up yellow mucus. Has increased SOB and lots of post nasal drip. Wants KC's recommendations. Please advise.

## 2011-12-17 NOTE — Telephone Encounter (Signed)
Pt is aware of KC's recommendations. He declined the antibiotic since it's not going to help. Was wanting something to help with post nasal drip, I advised an allergy medication OTC. He states that he is already doing this. States he will call back in a few weeks if things get worse.

## 2011-12-27 ENCOUNTER — Telehealth: Payer: Self-pay | Admitting: Pulmonary Disease

## 2011-12-27 MED ORDER — HYDROCODONE-HOMATROPINE 5-1.5 MG/5ML PO SYRP: 5.0000 mL | ORAL_SOLUTION | Freq: Four times a day (QID) | ORAL | Status: DC | PRN

## 2011-12-27 NOTE — Telephone Encounter (Signed)
Spoke with patient, patient stated his cough has worsened to the point he is passing out again.  Patient says cough has been going since April and has not gone away only lightens at times.  At some times states he coughs up yellow mucus and at times nothing comes up but he always feels like something is "stuck in his throat."  Patient reports he has tried tessalon perles, cough drops, cough syrup and all the recs per Dr. Shelle Iron from last visit with only a slight improvement.  Patient requesting recs and is aware Dr. Shelle Iron is out of office today.  Will forward to Dr. Maple Hudson, please advise, thank you!  Last OV: 12/10/11 Patient Instructions     Will do an xray of your sinuses, and will call you with the results and where to go from here.  Continue with your voice rest, no throat clearing, hard candy to bathe back of throat, do not let yourself get into the coughing jags.  Continue with chlorpheniramine at bedtime and at lunch to help with postnasal drip.        Allergies  Allergen Reactions  . Codeine Nausea And Vomiting  . Lisinopril     cough  . Ramipril     cough

## 2011-12-27 NOTE — Telephone Encounter (Signed)
Called spoke with patient, advised of CY's recs as stated below.  Pt okay with these recs and verbalized his understanding - pt to call if his symptoms do not improve or worsen.  Rx called to [facility] to pharmacist Dorene Grebe; also advised her that CY is aware of the codeine allergy on file and his response to this below.  Dorene Grebe verbalized her understanding.    Will sign off and forward to [facility] as FYI when he returns to the office.

## 2011-12-27 NOTE — Telephone Encounter (Signed)
Per CY-try Hydromet #131ml 1 tsp every 6 hours prn cough no refills; "different enough from codeine that he should be able to tolerate it".

## 2012-01-02 HISTORY — PX: CATARACT EXTRACTION W/ INTRAOCULAR LENS  IMPLANT, BILATERAL: SHX1307

## 2012-01-03 ENCOUNTER — Telehealth: Payer: Self-pay | Admitting: Pulmonary Disease

## 2012-01-03 ENCOUNTER — Encounter: Payer: Self-pay | Admitting: Emergency Medicine

## 2012-01-03 ENCOUNTER — Ambulatory Visit (INDEPENDENT_AMBULATORY_CARE_PROVIDER_SITE_OTHER): Payer: Medicare Other | Admitting: Emergency Medicine

## 2012-01-03 VITALS — BP 120/80 | HR 72 | Temp 98.8°F | Wt 189.8 lb

## 2012-01-03 DIAGNOSIS — R05 Cough: Secondary | ICD-10-CM

## 2012-01-03 DIAGNOSIS — R053 Chronic cough: Secondary | ICD-10-CM

## 2012-01-03 DIAGNOSIS — R059 Cough, unspecified: Secondary | ICD-10-CM

## 2012-01-03 MED ORDER — TRAMADOL HCL 50 MG PO TABS: 50.0000 mg | ORAL_TABLET | Freq: Four times a day (QID) | ORAL | Status: DC | PRN

## 2012-01-03 NOTE — Assessment & Plan Note (Signed)
Describes severe UA irritation, exacerbated by supine position, talking, eating and sometimes movement. Possible contributors are undertreated GERD, allergic rhinitis. He is frustrated and resistant to restarting the cyclical cough protocol. Complicated case, discussed w Dr Shelle Iron who knows him well. We will try to ramp up the PPI, insure that he is taking chlorpheniramine, try tramadol prn and refer him to ENT

## 2012-01-03 NOTE — Telephone Encounter (Signed)
Called, spoke with pt.  States he is still coughing and "blacking out" for 2-3 seconds at times with the coughing spells.  States the cough is prod at times with light yellow mucus.  Also, reports he "feels like there is something in my throat,"  Having increased SOB with activities like taking a shower, some wheezing, and fatigue.  Reports he has "12/10" back pain from coughing.  States hydromet that was called in last week did not help with the cough.  OV scheduled with RB for today at 4:15pm -- pt aware.

## 2012-01-03 NOTE — Patient Instructions (Addendum)
Please increase your omeprazole to 40mg  twice a day Continue using chlorpheniramine as needed for sinus congestion.  Use your cough syrup as prescribed We will refer you to see ENT to visualize your voice box and vocal cords.  Use tramadol 50mg  up to every 6 hours for cough Follow with Dr Shelle Iron in 1 month

## 2012-01-03 NOTE — Progress Notes (Signed)
  Subjective:    Patient ID: Hunter [name], male    DOB: 1950/11/02, 62 y.o.   MRN: [id number]  HPI The patient is followed by Dr Shelle Iron for chronic cough. At the last visit, he was felt to have cyclical coughing that was primarily upper airway in origin.  He was treated with cyclical cough protocol, and comes in today where he is 70% improved from the last visit.  However, the cough he does have has changed in character.  He is now having gurgling sounds with mucus, but is unclear if this is coming from his head with postnasal drip versus from his lower airways.  I would favor the former.  Previously, this was more of a dry hacking cough.  Acute Visit 01/03/12 -- presents for his chronic cough. He tells me that he got some better on the cyclical cough protocol per Dr Shelle Iron, but that cough never fully resolved. Now he is having cough syncope and dizziness. Sinus CT scan was normal. He describes a globus sensation, notes that Dr Channing Mutters has done neck surgeries before. He says that he has rare indigestion. Cough bothers him the most when he lays down, talks a lot. He is on omeprazole bid. Allergy skin testing was negative. He is no longer on zyrtec or nasonex. He just had a script for hycodan called in last week.       Objective:   Physical Exam Filed Vitals:   01/03/12 1618  BP: 120/80  Pulse: 72  Temp: 98.8 F (37.1 C)   Obese male in no acute distress Nose without purulence or discharge noted Oropharynx with mucus collecting in the back of the throat, no exudates Neck without lymphadenopathy or thyromegaly Chest totally clear to auscultation with no wheezes or crackles, however there is very loud upper airway noise. Cardiac exam with regular rate and rhythm Lower extremities with mild edema, no cyanosis Alert and oriented, moves all 4 extremities.     Assessment & Plan:  Chronic cough Describes severe UA irritation, exacerbated by supine position, talking, eating and sometimes movement.  Possible contributors are undertreated GERD, allergic rhinitis. He is frustrated and resistant to restarting the cyclical cough protocol. Complicated case, discussed w Dr Shelle Iron who knows him well. We will try to ramp up the PPI, insure that he is taking chlorpheniramine, try tramadol prn and refer him to ENT

## 2012-01-04 ENCOUNTER — Other Ambulatory Visit: Payer: Self-pay | Admitting: *Deleted

## 2012-01-04 MED ORDER — METOPROLOL TARTRATE 50 MG PO TABS: 50.0000 mg | ORAL_TABLET | Freq: Two times a day (BID) | ORAL | Status: DC

## 2012-01-04 MED ORDER — AMLODIPINE BESYLATE 5 MG PO TABS: 5.0000 mg | ORAL_TABLET | Freq: Every day | ORAL | Status: DC

## 2012-02-04 ENCOUNTER — Other Ambulatory Visit: Payer: Self-pay

## 2012-02-04 ENCOUNTER — Other Ambulatory Visit: Payer: Self-pay | Admitting: *Deleted

## 2012-02-04 ENCOUNTER — Encounter: Payer: Self-pay | Admitting: Pulmonary Disease

## 2012-02-04 ENCOUNTER — Ambulatory Visit (INDEPENDENT_AMBULATORY_CARE_PROVIDER_SITE_OTHER): Payer: Medicare Other | Admitting: Pulmonary Disease

## 2012-02-04 VITALS — BP 138/70 | HR 62 | Temp 97.6°F | Ht 65.0 in | Wt 193.8 lb

## 2012-02-04 DIAGNOSIS — J449 Chronic obstructive pulmonary disease, unspecified: Secondary | ICD-10-CM

## 2012-02-04 DIAGNOSIS — R053 Chronic cough: Secondary | ICD-10-CM

## 2012-02-04 DIAGNOSIS — R05 Cough: Secondary | ICD-10-CM

## 2012-02-04 DIAGNOSIS — R059 Cough, unspecified: Secondary | ICD-10-CM

## 2012-02-04 MED ORDER — EZETIMIBE 10 MG PO TABS: 10.0000 mg | ORAL_TABLET | Freq: Every day | ORAL | Status: DC

## 2012-02-04 NOTE — Telephone Encounter (Signed)
Patient states that he has run out of Tramadol 50mg  Rx and needs an Rx to hold him over until he sees Dr Channing Mutters in March. He plans to have Dr Channing Mutters to refill this med from here forward. Patient states that Dr Channing Mutters will not give him refills until he sees him next month. Requesting this be sent to [facility]. Patient aware that he may not have a response until tomorrow morning--pt okay with this.   Tramadol 50mg  Take 1 tablet (50 mg total) by mouth every 6 (six) hours as needed for pain (cough). #60 --zero refills   Dr Shelle Iron, please advise. Thanks.

## 2012-02-04 NOTE — Progress Notes (Signed)
  Subjective:    Patient ID: Hunter [name], male    DOB: 1950-11-16, 62 y.o.   MRN: [id number]  HPI The patient comes in today for followup of his chronic cough.  He has had some improvement since being treated aggressively for acid reflux, as well as tramadol for a possible neurogenic component.  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  His proton pump inhibitor dosage has been increased.  The patient continues to have some degree of cough, but also notes significant mucus production that he thinks is coming from his chest.  He has a history of very mild airflow obstruction.   Review of Systems  Constitutional: Negative for fever and unexpected weight change.  HENT: Positive for congestion, rhinorrhea, trouble swallowing and postnasal drip. Negative for ear pain, nosebleeds, sore throat, sneezing, dental problem and sinus pressure.   Eyes: Negative for redness and itching.  Respiratory: Positive for cough and shortness of breath. Negative for chest tightness and wheezing.   Cardiovascular: Positive for leg swelling. Negative for palpitations.  Gastrointestinal: Negative for nausea and vomiting.  Genitourinary: Negative for dysuria.  Musculoskeletal: Positive for joint swelling ( hand joint swelling).  Skin: Negative for rash.  Neurological: Negative for headaches.  Hematological: Does not bruise/bleed easily.  Psychiatric/Behavioral: Negative for dysphoric mood. The patient is not nervous/anxious.        Objective:   Physical Exam Obese male in no acute distress Nose without purulence or discharge noted Neck without lymphadenopathy or thyromegaly Chest with completely clear breath sounds, no true wheezing Cardiac exam regular rate and rhythm Lower extremities with mild edema noted, no cyanosis Alert and oriented, moves all 4 extremities.       Assessment & Plan:

## 2012-02-04 NOTE — Patient Instructions (Addendum)
Stay on omeprazole 40mg  in am and pm Get zantac 150mg  over the counter, and take everynight at bedtime. Can continue tramadol for cough, avoid throat clearing, use hard candy for tickle as we discussed previously.  followup with me in 6 weeks to check on progress.

## 2012-02-04 NOTE — Assessment & Plan Note (Signed)
The patient has minimal airflow obstruction on his recent spirometry, and his numbers are actually better than 2010.  I do not think this is an issue for his chronic call for his mucus production.

## 2012-02-04 NOTE — Telephone Encounter (Signed)
Ok to fill as written.

## 2012-02-04 NOTE — Assessment & Plan Note (Signed)
The patient continues to have a chronic cough, however he feels is much improved with more aggressive reflux treatment and with tramadol.  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  I have had good success in the past by adding an H2 blocker to b.i.d. Proton pump inhibitor, and will try this with the patient.  If he continues to have issues, he will need a GI consultation.  I have explained to him how laryngopharyngeal reflux can result in excessive mucus production by irritating his tracheobronchial tree.

## 2012-02-06 ENCOUNTER — Telehealth: Payer: Self-pay | Admitting: *Deleted

## 2012-02-06 MED ORDER — TRAMADOL HCL 50 MG PO TABS: 50.0000 mg | ORAL_TABLET | Freq: Four times a day (QID) | ORAL | Status: DC | PRN

## 2012-02-06 NOTE — Telephone Encounter (Signed)
Tramadol 50mg  Take 1 tablet (50 mg total) by mouth every 6 (six) hours as needed for pain (cough). #60 --zero refills  Called into pharmacy [facility]  Pt aware that medication has been called in.

## 2012-02-06 NOTE — Telephone Encounter (Signed)
Records requested from [facility] E[facility] Dr Jenne Pane have been received and placed in Hunter [name] folder for review. Last OV note.   Please advise. Thanks.

## 2012-03-03 ENCOUNTER — Other Ambulatory Visit: Payer: Self-pay | Admitting: Emergency Medicine

## 2012-03-04 ENCOUNTER — Other Ambulatory Visit: Payer: Self-pay | Admitting: Otolaryngology

## 2012-03-04 DIAGNOSIS — R05 Cough: Secondary | ICD-10-CM

## 2012-03-04 DIAGNOSIS — R1313 Dysphagia, pharyngeal phase: Secondary | ICD-10-CM

## 2012-03-04 DIAGNOSIS — R059 Cough, unspecified: Secondary | ICD-10-CM

## 2012-03-11 ENCOUNTER — Ambulatory Visit
Admission: RE | Admit: 2012-03-11 | Discharge: 2012-03-11 | Disposition: A | Discharge status: Home / Self Care | Payer: Medicare Other | Source: Ambulatory Visit | Attending: Otolaryngology | Admitting: Otolaryngology

## 2012-03-11 DIAGNOSIS — R1313 Dysphagia, pharyngeal phase: Secondary | ICD-10-CM

## 2012-03-11 DIAGNOSIS — R05 Cough: Secondary | ICD-10-CM

## 2012-03-11 DIAGNOSIS — R059 Cough, unspecified: Secondary | ICD-10-CM

## 2012-03-13 ENCOUNTER — Other Ambulatory Visit: Payer: Self-pay | Admitting: Neurosurgery

## 2012-03-13 DIAGNOSIS — M4714 Other spondylosis with myelopathy, thoracic region: Secondary | ICD-10-CM

## 2012-03-17 ENCOUNTER — Ambulatory Visit (INDEPENDENT_AMBULATORY_CARE_PROVIDER_SITE_OTHER): Payer: Medicare Other | Admitting: Pulmonary Disease

## 2012-03-17 ENCOUNTER — Encounter: Payer: Self-pay | Admitting: Pulmonary Disease

## 2012-03-17 VITALS — BP 108/62 | HR 66 | Temp 97.4°F | Ht 65.0 in | Wt 185.0 lb

## 2012-03-17 DIAGNOSIS — R053 Chronic cough: Secondary | ICD-10-CM

## 2012-03-17 DIAGNOSIS — R05 Cough: Secondary | ICD-10-CM

## 2012-03-17 DIAGNOSIS — J449 Chronic obstructive pulmonary disease, unspecified: Secondary | ICD-10-CM

## 2012-03-17 DIAGNOSIS — R059 Cough, unspecified: Secondary | ICD-10-CM

## 2012-03-17 NOTE — Patient Instructions (Addendum)
Stay on your reflux medications, and keep appt with ENT to re-examine your upper airway Do not take your nebulizer treatments unless you are having a hard time breathing. Don't take your antihistamines (zyrtec or chlorpheniramine unless you are having a lot of postnasal drip). followup with me as needed.

## 2012-03-17 NOTE — Assessment & Plan Note (Signed)
The pt's cough has resolved on aggressive reflux treatment.  He still has some tickle, and I have asked him to avoid throat clearing and to use hard candy if needed.  He is to keep f/u with ENT for upper airway exam.

## 2012-03-17 NOTE — Assessment & Plan Note (Signed)
The pt has minimal airflow obstruction on PFT's/spiro.  I do not think he requires daily maintenance medications.  He can use neb or rescue inhaler as needed.

## 2012-03-17 NOTE — Progress Notes (Signed)
  Subjective:    Patient ID: Hunter [name], male    DOB: 11/24/1950, 62 y.o.   MRN: [id number]  HPI Patient comes in today for followup of his chronic cough.  He also has minimal air-fluid structure and noted on pulmonary function studies.  His cough is felt to be secondary to laryngopharyngeal reflux and a cyclical cough mechanism, and he is much improved since being on b.i.d. Proton pump inhibitor.  He has been seen by otolaryngology who felt he had significant upper airway inflammation, probably related to reflux disease.  He has been scheduled for a followup exam after being on medications for reflux.   Review of Systems  Constitutional: Negative for fever and unexpected weight change.  HENT: Positive for congestion, rhinorrhea, sneezing and postnasal drip. Negative for ear pain, nosebleeds, sore throat, trouble swallowing, dental problem and sinus pressure.   Eyes: Negative for redness and itching.  Respiratory: Negative for cough, chest tightness, shortness of breath and wheezing.   Cardiovascular: Negative for palpitations and leg swelling.  Gastrointestinal: Negative for nausea and vomiting.  Genitourinary: Negative for dysuria.  Musculoskeletal: Negative for joint swelling.  Skin: Negative for rash.  Neurological: Negative for headaches.  Hematological: Does not bruise/bleed easily.  Psychiatric/Behavioral: Positive for hallucinations ( patient thinks d/t new medication or possibly d/t Nitrofurantin that he just finished ). Negative for dysphoric mood. The patient is not nervous/anxious.        Memory problems x 1 year---progressively worse       Objective:   Physical Exam Obese male in no acute distress Nose without purulence or discharge noted Neck without lymphadenopathy or thyromegaly Chest fairly clear Cardiac exam regular rate and rhythm Lower extremities with mild edema, no cyanosis Alert and oriented, moves all 4 extremities.       Assessment & Plan:

## 2012-03-18 ENCOUNTER — Ambulatory Visit
Admission: RE | Admit: 2012-03-18 | Discharge: 2012-03-18 | Disposition: A | Discharge status: Home / Self Care | Payer: Medicare Other | Source: Ambulatory Visit | Attending: Neurosurgery | Admitting: Neurosurgery

## 2012-03-18 DIAGNOSIS — M4714 Other spondylosis with myelopathy, thoracic region: Secondary | ICD-10-CM

## 2012-03-18 MED ORDER — GADOBENATE DIMEGLUMINE 529 MG/ML IV SOLN
16.0000 mL | Freq: Once | INTRAVENOUS | Status: AC | PRN
  Administered 2012-03-18: 16 mL via INTRAVENOUS

## 2012-04-16 ENCOUNTER — Encounter: Payer: Self-pay | Admitting: Gastroenterology

## 2012-06-02 ENCOUNTER — Other Ambulatory Visit: Payer: Self-pay | Admitting: *Deleted

## 2012-06-02 MED ORDER — METOPROLOL TARTRATE 50 MG PO TABS: 50.0000 mg | ORAL_TABLET | Freq: Two times a day (BID) | ORAL | Status: DC

## 2012-06-30 ENCOUNTER — Other Ambulatory Visit: Payer: Self-pay | Admitting: Cardiology

## 2012-07-01 ENCOUNTER — Other Ambulatory Visit: Payer: Self-pay | Admitting: Cardiology

## 2012-08-01 ENCOUNTER — Other Ambulatory Visit: Payer: Self-pay | Admitting: Cardiology

## 2012-08-05 ENCOUNTER — Other Ambulatory Visit: Payer: Self-pay | Admitting: Cardiology

## 2012-08-07 NOTE — Telephone Encounter (Signed)
Fax Received. Refill Completed. Hunter [name] (R.M.A)  PT NEED APPOINTMENT  

## 2012-10-03 ENCOUNTER — Other Ambulatory Visit: Payer: Self-pay | Admitting: Cardiology

## 2012-10-05 ENCOUNTER — Other Ambulatory Visit: Payer: Self-pay | Admitting: Cardiology

## 2012-10-31 ENCOUNTER — Ambulatory Visit (INDEPENDENT_AMBULATORY_CARE_PROVIDER_SITE_OTHER): Payer: Medicare Other | Admitting: Cardiology

## 2012-10-31 ENCOUNTER — Encounter: Payer: Self-pay | Admitting: Cardiology

## 2012-10-31 VITALS — BP 147/66 | HR 59 | Ht 66.0 in | Wt 173.0 lb

## 2012-10-31 DIAGNOSIS — I119 Hypertensive heart disease without heart failure: Secondary | ICD-10-CM

## 2012-10-31 DIAGNOSIS — I259 Chronic ischemic heart disease, unspecified: Secondary | ICD-10-CM

## 2012-10-31 DIAGNOSIS — R059 Cough, unspecified: Secondary | ICD-10-CM

## 2012-10-31 DIAGNOSIS — R053 Chronic cough: Secondary | ICD-10-CM

## 2012-10-31 DIAGNOSIS — R05 Cough: Secondary | ICD-10-CM

## 2012-10-31 DIAGNOSIS — IMO0001 Reserved for inherently not codable concepts without codable children: Secondary | ICD-10-CM

## 2012-10-31 NOTE — Assessment & Plan Note (Signed)
The patient is not having any headaches or dizzy spells.  No symptoms of CHF.

## 2012-10-31 NOTE — Assessment & Plan Note (Signed)
His chronic cough has improved.  He also has a history of GERD.  He states that the acid reflux had previously damaged his larynx but that appears to be improving as well.

## 2012-10-31 NOTE — Assessment & Plan Note (Signed)
The patient has not had any increase in chest pain.  He takes just a very occasional sublingual nitroglycerin.

## 2012-10-31 NOTE — Assessment & Plan Note (Signed)
The patient is not having any hypo-glycemic episodes.  His diabetes is followed closely at [facility].

## 2012-10-31 NOTE — Patient Instructions (Signed)
Your physician recommends that you continue on your current medications as directed. Please refer to the Current Medication list given to you today.  Your physician wants you to follow-up in: 6 month ov/ekg You will receive a reminder letter in the mail two months in advance. If you don't receive a letter, please call our office to schedule the follow-up appointment.   DECREASE YOUR SALT INTAKE

## 2012-10-31 NOTE — Progress Notes (Signed)
[name] Date of Birth:  1950/10/06 [facility] [address] [place], [place]  [address] [phone]         Fax   [phone]  History of Present Illness: This pleasant 63 year old gentleman is seen for a  followup office visit.  We last saw him about 18 months ago.  Somehow he missed rescheduling his 6 month office visit.  He has a history of known ischemic heart disease.  He had a myocardial infarction with stent in 2005.  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  He had increasing chest pain in September 2012 and underwent cardiac catheterization by Dr. [place] on 09/12/10 showed normal left ventricular systolic function and showed that all of his grafts were patent.  Patient does have occasional chest discomfort.  He has not had to take any recent sublingual nitroglycerin.  He quit smoking 3 years ago and this has helped his breathing and also his chest pain. Current Outpatient Prescriptions  Medication Sig Dispense Refill  . amLODipine (NORVASC) 5 MG tablet Take 1 tablet (5 mg total) by mouth daily.  30 tablet  6  . aspirin 81 MG tablet Take 81 mg by mouth daily.        [name] atorvastatin (LIPITOR) 20 MG tablet Take 10 mg by mouth daily.       . cetirizine (ZYRTEC) 10 MG tablet Take 10 mg by mouth daily.      . chlorpheniramine (CHLOR-TABLETS) 4 MG tablet Take 4 mg by mouth daily.        . clopidogrel (PLAVIX) 75 MG tablet TAKE 1 TABLET BY MOUTH EVERY DAY  30 tablet  1  . Docusate Calcium (STOOL SOFTENER PO) Take by mouth daily.        [name] doxazosin (CARDURA) 2 MG tablet Take 2 mg by mouth daily.        [name] gabapentin (NEURONTIN) 300 MG capsule Take 300 mg by mouth 3 (three) times daily.      [name] glimepiride (AMARYL) 1 MG tablet Take 4 mg by mouth daily before breakfast.       . HUMALOG KWIKPEN 100 UNIT/ML SOPN 11 Units 3 (three) times daily with meals.       [name] LEVEMIR FLEXPEN 100 UNIT/ML SOPN 30 Units 3 (three) times daily.       .  metoprolol (LOPRESSOR) 50 MG tablet TAKE 1 TABLET (50 MG TOTAL) BY MOUTH 2 (TWO) TIMES DAILY.  60 tablet  0  . mometasone (NASONEX) 50 MCG/ACT nasal spray 2 sprays by Nasal route as needed.        . nitroGLYCERIN (NITROSTAT) 0.4 MG SL tablet Place 1 tablet (0.4 mg total) under the tongue every 5 (five) minutes as needed.  100 tablet  3  . omeprazole (PRILOSEC) 20 MG capsule Take 40 mg by mouth 2 (two) times daily.      . ONE TOUCH ULTRA TEST test strip 1 each daily.       Letta Pate DELICA LANCETS MISC daily.      [name] oxybutynin (DITROPAN XL) 15 MG 24 hr tablet Take 15 mg by mouth daily.        . traMADol (ULTRAM) 50 MG tablet Take 50 mg by mouth every 6 (six) hours as needed.       . triamterene-hydrochlorothiazide (MAXZIDE-25) 37.5-25 MG per tablet Take 1 tablet by mouth daily.       [name] ZETIA 10 MG tablet TAKE 1 TABLET (10 MG  TOTAL) BY MOUTH DAILY.  30 tablet  1   No current facility-administered medications for this visit.    Allergies  Allergen Reactions  . Codeine Nausea And Vomiting  . Lisinopril     cough  . Ramipril     cough    Patient Active Problem List   Diagnosis Date Noted  . Chest pain radiating to arm 03/31/2010    Priority: High  . Spinal cord tumor 03/31/2010    Priority: Medium  . Chronic ischemic heart disease     Priority: Medium  . Chronic cough 11/26/2011  . 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 Depression 12/13/2010  . Benign hypertensive heart disease without heart failure 03/31/2010  . COPD (chronic obstructive pulmonary disease)   . Diabetes mellitus   . PAD (peripheral artery disease)   . MYOCARDIAL INFARCTION 01/28/2008  . ALLERGIC RHINITIS 01/28/2008    History  Smoking status  . Former Smoker -- 0.50 packs/day for 40 years  . Types: Cigarettes  . Quit date: 01/01/2009  Smokeless tobacco  . Never Used    History  Alcohol Use  . Yes    Comment: rare    Family History  Problem Relation Age of Onset  .  Other Father     WORK ACCIDENT  . Heart attack Mother   . Heart disease Mother   . Other Brother     SUICIDE  . Heart disease Brother   . Coronary artery disease Sister     CABG  . Heart disease Sister   . Cancer Sister     Lung  . Coronary artery disease Brother   . COPD Sister     Review of Systems: Constitutional: no fever chills diaphoresis or fatigue or change in weight.  Head and neck: no hearing loss, no epistaxis, no photophobia or visual disturbance. Respiratory: No cough, shortness of breath or wheezing. Cardiovascular: No chest pain peripheral edema, palpitations. Gastrointestinal: No abdominal distention, no abdominal pain, no change in bowel habits hematochezia or melena. Genitourinary: No dysuria, no frequency, no urgency, no nocturia. Musculoskeletal:No arthralgias, no back pain, no gait disturbance or myalgias. Neurological: No dizziness, no headaches, no numbness, no seizures, no syncope, no weakness, no tremors. Hematologic: No lymphadenopathy, no easy bruising. Psychiatric: No confusion, no hallucinations, no sleep disturbance.    Physical Exam: Filed Vitals:   10/31/12 1219  BP: 147/66  Pulse: 59   the general appearance reveals a well-developed well-nourished tanned gentleman in no distress.  He travels by motorized wheelchair.Pupils equal and reactive.   Extraocular Movements are full.  There is no scleral icterus.  The mouth and pharynx are normal.  The neck is supple.  The carotids reveal no bruits.  The jugular venous pressure is normal.  The thyroid is not enlarged.  There is no lymphadenopathy.  The chest is clear to percussion and auscultation. There are no rales or rhonchi. Expansion of the chest is symmetrical.  The precordium is quiet.  The first heart sound is normal.  The second heart sound is physiologically split.  There is no murmur gallop rub or click.  There is no abnormal lift or heave.  The abdomen is soft and nontender. Bowel sounds  are normal. The liver and spleen are not enlarged. There Are no abdominal masses. There are no bruits.  Extremities show no phlebitis he has trace edema of the left lower extremity. The skin is warm and dry.  There is no rash.  EKG  today shows normal sinus rhythm with nonspecific T-wave abnormalities slightly increased in the inferolateral leads since 5/31/  Assessment / Plan: Patient is doing well from the cardiac standpoint.  He'll continue on same medication.  Recheck in 6 months for office visit and EKG

## 2012-11-03 ENCOUNTER — Other Ambulatory Visit: Payer: Self-pay | Admitting: Cardiology

## 2012-11-04 ENCOUNTER — Other Ambulatory Visit: Payer: Self-pay | Admitting: Physician Assistant

## 2012-11-20 ENCOUNTER — Encounter (HOSPITAL_COMMUNITY): Payer: Medicare Other

## 2012-11-20 ENCOUNTER — Ambulatory Visit: Payer: Medicare Other | Admitting: Family

## 2012-11-26 ENCOUNTER — Ambulatory Visit: Payer: Medicare Other | Admitting: Cardiology

## 2012-12-03 ENCOUNTER — Other Ambulatory Visit: Payer: Self-pay | Admitting: Cardiology

## 2013-01-14 ENCOUNTER — Encounter: Payer: Self-pay | Admitting: Family

## 2013-01-15 ENCOUNTER — Ambulatory Visit: Payer: Medicare Other | Admitting: Family

## 2013-01-15 ENCOUNTER — Encounter: Payer: Self-pay | Admitting: Family

## 2013-01-15 ENCOUNTER — Encounter (HOSPITAL_COMMUNITY): Payer: Medicare Other

## 2013-01-15 ENCOUNTER — Ambulatory Visit (INDEPENDENT_AMBULATORY_CARE_PROVIDER_SITE_OTHER): Payer: Medicare Other | Admitting: Family

## 2013-01-15 ENCOUNTER — Ambulatory Visit (HOSPITAL_COMMUNITY)
Admission: RE | Admit: 2013-01-15 | Discharge: 2013-01-15 | Disposition: A | Discharge status: Home / Self Care | Payer: Medicare Other | Source: Ambulatory Visit | Attending: Family | Admitting: Family

## 2013-01-15 VITALS — BP 137/68 | HR 56 | Resp 16 | Ht 65.0 in | Wt 205.0 lb

## 2013-01-15 DIAGNOSIS — E119 Type 2 diabetes mellitus without complications: Secondary | ICD-10-CM | POA: Insufficient documentation

## 2013-01-15 DIAGNOSIS — I739 Peripheral vascular disease, unspecified: Secondary | ICD-10-CM | POA: Insufficient documentation

## 2013-01-15 DIAGNOSIS — M79609 Pain in unspecified limb: Secondary | ICD-10-CM

## 2013-01-15 DIAGNOSIS — I7092 Chronic total occlusion of artery of the extremities: Secondary | ICD-10-CM

## 2013-01-15 DIAGNOSIS — M79643 Pain in unspecified hand: Secondary | ICD-10-CM | POA: Insufficient documentation

## 2013-01-15 DIAGNOSIS — I10 Essential (primary) hypertension: Secondary | ICD-10-CM | POA: Insufficient documentation

## 2013-01-15 DIAGNOSIS — E785 Hyperlipidemia, unspecified: Secondary | ICD-10-CM | POA: Insufficient documentation

## 2013-01-15 DIAGNOSIS — I70209 Unspecified atherosclerosis of native arteries of extremities, unspecified extremity: Secondary | ICD-10-CM | POA: Insufficient documentation

## 2013-01-15 NOTE — Patient Instructions (Signed)
Peripheral Vascular Disease Peripheral Vascular Disease (PVD), also called Peripheral Arterial Disease (PAD), is a circulation problem caused by cholesterol (atherosclerotic plaque) deposits in the arteries. PVD commonly occurs in the lower extremities (legs) but it can occur in other areas of the body, such as your arms. The cholesterol buildup in the arteries reduces blood flow which can cause pain and other serious problems. The presence of PVD can place a person at risk for Coronary Artery Disease (CAD).  CAUSES  Causes of PVD can be many. It is usually associated with more than one risk factor such as:   High Cholesterol.  Smoking.  Diabetes.  Lack of exercise or inactivity.  High blood pressure (hypertension).  Obesity.  Family history. SYMPTOMS   When the lower extremities are affected, patients with PVD may experience:  Leg pain with exertion or physical activity. This is called INTERMITTENT CLAUDICATION. This may present as cramping or numbness with physical activity. The location of the pain is associated with the level of blockage. For example, blockage at the abdominal level (distal abdominal aorta) may result in buttock or hip pain. Lower leg arterial blockage may result in calf pain.  As PVD becomes more severe, pain can develop with less physical activity.  In people with severe PVD, leg pain may occur at rest.  Other PVD signs and symptoms:  Leg numbness or weakness.  Coldness in the affected leg or foot, especially when compared to the other leg.  A change in leg color.  Patients with significant PVD are more prone to ulcers or sores on toes, feet or legs. These may take longer to heal or may reoccur. The ulcers or sores can become infected.  If signs and symptoms of PVD are ignored, gangrene may occur. This can result in the loss of toes or loss of an entire limb.  Not all leg pain is related to PVD. Other medical conditions can cause leg pain such  as:  Blood clots (embolism) or Deep Vein Thrombosis.  Inflammation of the blood vessels (vasculitis).  Spinal stenosis. DIAGNOSIS  Diagnosis of PVD can involve several different types of tests. These can include:  Pulse Volume Recording Method (PVR). This test is simple, painless and does not involve the use of X-rays. PVR involves measuring and comparing the blood pressure in the arms and legs. An ABI (Ankle-Brachial Index) is calculated. The normal ratio of blood pressures is 1. As this number becomes smaller, it indicates more severe disease.  < 0.95  indicates significant narrowing in one or more leg vessels.  <0.8 there will usually be pain in the foot, leg or buttock with exercise.  <0.4 will usually have pain in the legs at rest.  <0.25  usually indicates limb threatening PVD.  Doppler detection of pulses in the legs. This test is painless and checks to see if you have a pulses in your legs/feet.  A dye or contrast material (a substance that highlights the blood vessels so they show up on x-ray) may be given to help your caregiver better see the arteries for the following tests. The dye is eliminated from your body by the kidney's. Your caregiver may order blood work to check your kidney function and other laboratory values before the following tests are performed:  Magnetic Resonance Angiography (MRA). An MRA is a picture study of the blood vessels and arteries. The MRA machine uses a large magnet to produce images of the blood vessels.  Computed Tomography Angiography (CTA). A CTA is a   specialized x-ray that looks at how the blood flows in your blood vessels. An IV may be inserted into your arm so contrast dye can be injected.  Angiogram. Is a procedure that uses x-rays to look at your blood vessels. This procedure is minimally invasive, meaning a small incision (cut) is made in your groin. A small tube (catheter) is then inserted into the artery of your groin. The catheter is  guided to the blood vessel or artery your caregiver wants to examine. Contrast dye is injected into the catheter. X-rays are then taken of the blood vessel or artery. After the images are obtained, the catheter is taken out. TREATMENT  Treatment of PVD involves many interventions which may include:  Lifestyle changes:  Quitting smoking.  Exercise.  Following a low fat, low cholesterol diet.  Control of diabetes.  Foot care is very important to the PVD patient. Good foot care can help prevent infection.  Medication:  Cholesterol-lowering medicine.  Blood pressure medicine.  Anti-platelet drugs.  Certain medicines may reduce symptoms of Intermittent Claudication.  Interventional/Surgical options:  Angioplasty. An Angioplasty is a procedure that inflates a balloon in the blocked artery. This opens the blocked artery to improve blood flow.  Stent Implant. A wire mesh tube (stent) is placed in the artery. The stent expands and stays in place, allowing the artery to remain open.  Peripheral Bypass Surgery. This is a surgical procedure that reroutes the blood around a blocked artery to help improve blood flow. This type of procedure may be performed if Angioplasty or stent implants are not an option. SEEK IMMEDIATE MEDICAL CARE IF:   You develop pain or numbness in your arms or legs.  Your arm or leg turns cold, becomes blue in color.  You develop redness, warmth, swelling and pain in your arms or legs. MAKE SURE YOU:   Understand these instructions.  Will watch your condition.  Will get help right away if you are not doing well or get worse. Document Released: 01/26/2004 Document Revised: 03/12/2011 Document Reviewed: 12/23/2007 [employer] Patient Information 2014 [employer].  

## 2013-01-15 NOTE — Progress Notes (Signed)
[facility] [place] HISTORY AND PHYSICAL -PAD  Previous Bypass Surgery/ Stent Placement: No  History of Present Illness Hunter [name] is a 63 y.o. male patient of Dr. 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 He returns today for PAD surveillance. He does not ambulate, therefore cannot report claudication, but he does use his legs to move his wheelchair, and the pain in his leg lower leg is no worse when using his legs. He denies rest pain in right leg, but states left lower leg "feels like it's on fire almost all the time", this has gradually worsened in the last year from 3/10 to 5/10. He also reports known left sciatic nerve problems, has had several spinal surgeries. He also reports that his last a couple of weeks ago A1C was 8.2%, improved from over 10, still uncontrolled but improved. Gabapentin helps some of his left leg pain.  He denies non-healing wounds. He denies history of stroke or TIA symptoms. He did have veins harvested from both legs for his 4 vessel CABG in 2005, had swelling in both LE's since then, left lower leg more so. He does wear compression hose on legs. He recently saw his podiatrist. He has declined sleep studies in the past but states he will consider requesting sleep studies; he reports snoring and severe daytime fatigue. He admits to not exercising lately as much as he did in the past, partly due to recent eye surgery.  Pt reports New Medical or Surgical History: right cataract extraction with IOL Sept., 2014.   Pt smoker: former smoker, quit 5 years ago  Pt meds include: Statin :Yes ASA: Yes Other anticoagulants/antiplatelets: Plavix  Past Medical History  Diagnosis Date  . CAD (coronary artery disease)     Remote MI with stent in 2005 and CABG in 2005  . COPD (chronic obstructive pulmonary disease)   . Diabetes mellitus   . History  of myocardial infarction   . PAD (peripheral artery disease)     followed by Dr. [name]  . Coronary artery disease   . Myocardial infarction   . Hypertension   . S/P CABG (coronary artery bypass graft) 2005  . Normal nuclear stress test April 2012  . Tobacco dependence in remission   . Increased liver enzymes     due to alcohol use  . Arthritis   . Occlusive disease, arterial     lower extremity  . other     spinal cord damage  . Nocturia     Social History History  Substance Use Topics  . Smoking status: Former Smoker -- 0.50 packs/day for 40 years    Types: Cigarettes    Quit date: 01/01/2009  . Smokeless tobacco: Never Used  . Alcohol Use: Yes     Comment: rare    Family History Family History  Problem Relation Age of Onset  . Other Father     WORK ACCIDENT  . Heart attack Mother   . Heart disease Mother   . Other Brother     SUICIDE  . Heart disease Brother   . Coronary artery disease Sister     CABG  . Heart disease Sister   . Cancer Sister     Lung  . Coronary artery disease Brother   . COPD Sister     Past Surgical History  Procedure Laterality Date  . Eye surgery  BILATERAL TEAR DUCT  . Coronary artery bypass graft  2005    LIMA to LAD, SVG to RCA & PD, SVG to OM 1, SVG to 1st DX; Dr. [name]  . Epispadius correction    . Femur surgery      RIGHT  . Back surgery      FUSIONS  . Spinal tumor    . Laminectomy    . Hand surgery      Left     Allergies  Allergen Reactions  . Codeine Nausea And Vomiting  . Lisinopril     cough  . Ramipril     cough    Current Outpatient Prescriptions  Medication Sig Dispense Refill  . amLODipine (NORVASC) 5 MG tablet Take 1 tablet (5 mg total) by mouth daily.  30 tablet  6  . aspirin 81 MG tablet Take 81 mg by mouth daily.        [name] atorvastatin (LIPITOR) 20 MG tablet Take 10 mg by mouth daily.       . cetirizine (ZYRTEC) 10 MG tablet Take 10 mg by mouth daily.      . chlorpheniramine  (CHLOR-TABLETS) 4 MG tablet Take 4 mg by mouth daily.        . clopidogrel (PLAVIX) 75 MG tablet TAKE 1 TABLET BY MOUTH EVERY DAY  30 tablet  6  . Docusate Calcium (STOOL SOFTENER PO) Take by mouth daily.        [name] doxazosin (CARDURA) 2 MG tablet Take 2 mg by mouth daily.        [name] gabapentin (NEURONTIN) 300 MG capsule Take 300 mg by mouth 3 (three) times daily.      [name] glimepiride (AMARYL) 1 MG tablet Take 4 mg by mouth daily before breakfast.       . HUMALOG KWIKPEN 100 UNIT/ML SOPN 11 Units 3 (three) times daily with meals.       [name] LEVEMIR FLEXPEN 100 UNIT/ML SOPN 30 Units 3 (three) times daily.       . metoprolol (LOPRESSOR) 50 MG tablet TAKE 1 TABLET (50 MG TOTAL) BY MOUTH 2 (TWO) TIMES DAILY.  60 tablet  6  . mometasone (NASONEX) 50 MCG/ACT nasal spray 2 sprays by Nasal route as needed.        . nitroGLYCERIN (NITROSTAT) 0.4 MG SL tablet Place 1 tablet (0.4 mg total) under the tongue every 5 (five) minutes as needed.  100 tablet  3  . NOVOFINE 32G X 6 MM MISC       . omeprazole (PRILOSEC) 20 MG capsule Take 40 mg by mouth 2 (two) times daily.      . ONE TOUCH ULTRA TEST test strip 1 each daily.       [name] DELICA LANCETS MISC daily.      [name] oxybutynin (DITROPAN XL) 15 MG 24 hr tablet Take 15 mg by mouth daily.        . traMADol (ULTRAM) 50 MG tablet Take 50 mg by mouth every 6 (six) hours as needed.       . triamterene-hydrochlorothiazide (MAXZIDE-25) 37.5-25 MG per tablet Take 1 tablet by mouth daily.       [name] ZETIA 10 MG tablet TAKE 1 TABLET (10 MG TOTAL) BY MOUTH DAILY.  30 tablet  1   No current facility-administered medications for this visit.    ROS: See HPI for pertinent positives and negatives.   Physical Examination  Filed Vitals:   01/15/13 1258  BP: 137/68  Pulse:  56  Resp: 16   Filed Weights   01/15/13 1258  Weight: 205 lb (92.987 kg)   Body mass index is 34.11 kg/(m^2).   General: A&O x 3, obese male. Gait: in wheelchair Eyes: PERRLA, Pulmonary: CTAB,  without wheezes , rales or rhonchi Cardiac: regular Rythm , without detected murmur          Carotid Bruits Left Right   Negative Negative  Aorta: is not palpable Radial pulses: 2+ palpable and =                           VASCULAR EXAM: Extremities without ischemic changes  without Gangrene; without open wounds.                                                                                                          LE Pulses LEFT RIGHT       POPLITEAL  not palpable   not palpable       POSTERIOR TIBIAL  not palpable   not palpable        DORSALIS PEDIS      ANTERIOR TIBIAL not palpable  not palpable    Abdomen: soft, NT, no masses. Skin: no rashes, no ulcers noted. Musculoskeletal: no muscle wasting or atrophy.  Neurologic: A&O X 3; Appropriate Affect ; SENSATION: normal; MOTOR FUNCTION:  moving all extremities equally, motor strength 5/5 in UE's, 3/5 in LE's. Speech is fluent/normal. CN 2-12 intact.  Non-Invasive Vascular Imaging: DATE: 01/15/2013 ABI: RIGHT 0.69, Waveforms: monophasic;  LEFT 0.47, Waveforms: monophasic Previous (11/15/11) ABI's: Right: 0.66, Left: 0.59  ASSESSMENT: Hunter [name] is a 64 y.o. male who presents with moderate arterial occlusive disease in RLE and severe arterial occlusive disease in LLE. He does not have non-healing ulcers.  The pain in his left lower leg has gradually increased over the last year from 3/10 to 5/10, somewhat relieved by gabapentin. His atherosclerotic risk factors under medical management are uncontrolled DM which is improving, sedentary as confined to his w/c, obesity, dyslipidemia, and possible untreated OSA; he declined sleep studies in the past.  The Invokana will help him lose weight in addition to improving glycemic control, he states he will improve his exercise habits, and consider scheduling sleep studies. He will benefit more from medical management than surgical intervention to his LLE at this point, but should he  develop a non-healing ulcer he will let us know.  PLAN:  I discussed in depth with the patient the nature of atherosclerosis, and emphasized the importance of maximal medical management including strict control of blood pressure, blood glucose, and lipid levels, obtaining regular exercise, and continued cessation of smoking.  The patient is aware that without maximal medical management the underlying atherosclerotic disease process will progress, limiting the benefit of any interventions.  Based on the patient's vascular studies and examination, and after discussing with Dr. [name],  pt will return to clinic in 6 months for ABI's and evaluation.   The patient was given information about PAD including signs,  symptoms, treatment, what symptoms should prompt the patient to seek immediate medical care, and risk reduction measures to take.  Clemon Chambers, RN, MSN, FNP-C [facility] Phone: [phone]  Clinic MD: [facility]  01/15/2013 12:49 PM

## 2013-01-15 NOTE — Addendum Note (Signed)
Addended by: Mena Goes on: 01/15/2013 04:35 PM   Modules accepted: Orders

## 2013-04-29 ENCOUNTER — Encounter (INDEPENDENT_AMBULATORY_CARE_PROVIDER_SITE_OTHER): Payer: Self-pay

## 2013-04-29 ENCOUNTER — Ambulatory Visit (INDEPENDENT_AMBULATORY_CARE_PROVIDER_SITE_OTHER): Payer: Medicare Other | Admitting: Cardiology

## 2013-04-29 ENCOUNTER — Encounter: Payer: Self-pay | Admitting: Cardiology

## 2013-04-29 VITALS — BP 138/58 | HR 63 | Ht 65.0 in | Wt 204.0 lb

## 2013-04-29 DIAGNOSIS — I119 Hypertensive heart disease without heart failure: Secondary | ICD-10-CM

## 2013-04-29 DIAGNOSIS — E1165 Type 2 diabetes mellitus with hyperglycemia: Secondary | ICD-10-CM

## 2013-04-29 DIAGNOSIS — I259 Chronic ischemic heart disease, unspecified: Secondary | ICD-10-CM

## 2013-04-29 DIAGNOSIS — IMO0001 Reserved for inherently not codable concepts without codable children: Secondary | ICD-10-CM

## 2013-04-29 DIAGNOSIS — I872 Venous insufficiency (chronic) (peripheral): Secondary | ICD-10-CM

## 2013-04-29 NOTE — Patient Instructions (Signed)
Your physician recommends that you continue on your current medications as directed. Please refer to the Current Medication list given to you today.  Your physician wants you to follow-up in: 6 MONTH OV  You will receive a reminder letter in the mail two months in advance. If you don't receive a letter, please call our office to schedule the follow-up appointment.  

## 2013-04-29 NOTE — Assessment & Plan Note (Signed)
Blood pressures remaining stable on current therapy.  No headaches or dizziness.  No syncope.

## 2013-04-29 NOTE — Assessment & Plan Note (Signed)
The patient denies any recurrent symptoms to suggest angina pectoris.

## 2013-04-29 NOTE — Assessment & Plan Note (Signed)
The patient has chronic dependent edema secondary to venous insufficiency.  The patient is also on amlodipine which can be contributing to the edema.

## 2013-04-29 NOTE — Progress Notes (Signed)
Hunter [name] Date of Birth:  Jul 30, 1950 [place] [address] [place], [place]  [address] [phone]        Fax   [phone]   History of Present Illness: This pleasant 63 year old gentleman is seen for a followup office visit. We last saw him on 10/31/12. He has a history of known ischemic heart disease. He had a myocardial infarction with stent in 2005.  The patient underwent coronary artery bypass graft surgery also in 2005. He had increasing chest pain in September 2012 and underwent cardiac catheterization by Dr. [name] on 09/12/10 showed normal left ventricular systolic function and showed that all of his grafts were patent. Patient does have occasional chest discomfort. He has not had to take any recent sublingual nitroglycerin. He quit smoking 3 years ago and this has helped his breathing and also his chest pain.  His appetite is good and he has been gaining weight.  He is in a wheelchair.  He does not get any regular exercise.  Current Outpatient Prescriptions  Medication Sig Dispense Refill  . amLODipine (NORVASC) 5 MG tablet Take 1 tablet (5 mg total) by mouth daily.  30 tablet  6  . aspirin 81 MG tablet Take 81 mg by mouth daily.        [name] atorvastatin (LIPITOR) 20 MG tablet Take 10 mg by mouth daily.       . Canagliflozin (INVOKANA) 100 MG TABS Take by mouth daily.      . cetirizine (ZYRTEC) 10 MG tablet Take 10 mg by mouth daily.      . chlorpheniramine (CHLOR-TABLETS) 4 MG tablet Take 4 mg by mouth daily.        . clopidogrel (PLAVIX) 75 MG tablet TAKE 1 TABLET BY MOUTH EVERY DAY  30 tablet  6  . Docusate Calcium (STOOL SOFTENER PO) Take by mouth daily.        [name] doxazosin (CARDURA) 2 MG tablet Take 2 mg by mouth daily.        [name] gabapentin (NEURONTIN) 300 MG capsule Take 300 mg by mouth 3 (three) times daily.      [name] HUMALOG KWIKPEN 100 UNIT/ML SOPN 11 Units 3 (three) times daily with meals.       [name] LEVEMIR FLEXPEN 100 UNIT/ML SOPN 30 Units 3  (three) times daily.       . metoprolol (LOPRESSOR) 50 MG tablet TAKE 1 TABLET (50 MG TOTAL) BY MOUTH 2 (TWO) TIMES DAILY.  60 tablet  6  . mometasone (NASONEX) 50 MCG/ACT nasal spray 2 sprays by Nasal route as needed.        . nitroGLYCERIN (NITROSTAT) 0.4 MG SL tablet Place 1 tablet (0.4 mg total) under the tongue every 5 (five) minutes as needed.  100 tablet  3  . NOVOFINE 32G X 6 MM MISC       . omeprazole (PRILOSEC) 20 MG capsule Take 40 mg by mouth 2 (two) times daily.      . ONE TOUCH ULTRA TEST test strip 1 each daily.       [name] DELICA LANCETS MISC daily.      [name] oxybutynin (DITROPAN XL) 15 MG 24 hr tablet Take 15 mg by mouth daily.        . traMADol (ULTRAM) 50 MG tablet Take 50 mg by mouth every 6 (six) hours as needed.       . triamterene-hydrochlorothiazide (MAXZIDE-25) 37.5-25 MG per tablet Take 1 tablet by mouth  daily.       [name] ZETIA 10 MG tablet TAKE 1 TABLET (10 MG TOTAL) BY MOUTH DAILY.  30 tablet  1   No current facility-administered medications for this visit.    Allergies  Allergen Reactions  . Codeine Nausea And Vomiting  . Lisinopril     cough  . Ramipril     cough    Patient Active Problem List   Diagnosis Date Noted  . Chest pain radiating to arm 03/31/2010    Priority: High  . Spinal cord tumor 03/31/2010    Priority: Medium  . Chronic ischemic heart disease     Priority: Medium  . Venous insufficiency of both lower extremities 04/29/2013  . Peripheral vascular disease, unspecified 01/15/2013  . Hand pain 01/15/2013  . Chronic cough 11/26/2011  . 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 Depression 12/13/2010  . Benign hypertensive heart disease without heart failure 03/31/2010  . COPD (chronic obstructive pulmonary disease)   . Type II or unspecified type diabetes mellitus without mention of complication, uncontrolled   . PAD (peripheral artery disease)   . MYOCARDIAL INFARCTION 01/28/2008  . ALLERGIC RHINITIS  01/28/2008    History  Smoking status  . Former Smoker -- 0.50 packs/day for 40 years  . Types: Cigarettes  . Quit date: 01/01/2009  Smokeless tobacco  . Never Used    History  Alcohol Use  . Yes    Comment: rare    Family History  Problem Relation Age of Onset  . Other Father     WORK ACCIDENT  . Heart disease Father     Heart Disease before age 4  . Heart attack Mother   . Heart disease Mother   . Cancer Mother   . Diabetes Mother   . Other Brother     SUICIDE  . Heart disease Brother   . Diabetes Brother   . Heart attack Brother   . Coronary artery disease Sister     CABG  . Heart disease Sister   . Cancer Sister     Lung  . Diabetes Sister   . Heart attack Sister   . Coronary artery disease Brother   . COPD Sister     Review of Systems: Constitutional: no fever chills diaphoresis or fatigue or change in weight.  Head and neck: no hearing loss, no epistaxis, no photophobia or visual disturbance. Respiratory: No cough, shortness of breath or wheezing. Cardiovascular: No chest pain peripheral edema, palpitations. Gastrointestinal: No abdominal distention, no abdominal pain, no change in bowel habits hematochezia or melena. Genitourinary: No dysuria, no frequency, no urgency, no nocturia. Musculoskeletal:No arthralgias, no back pain, no gait disturbance or myalgias. Neurological: No dizziness, no headaches, no numbness, no seizures, no syncope, no weakness, no tremors. Hematologic: No lymphadenopathy, no easy bruising. Psychiatric: No confusion, no hallucinations, no sleep disturbance.    Physical Exam: Filed Vitals:   04/29/13 1506  BP: 138/58  Pulse: 63   the general appearance reveals a well-developed well-nourished gentleman who is in a wheelchair.The head and neck exam reveals pupils equal and reactive.  Extraocular movements are full.  There is no scleral icterus.  The mouth and pharynx are normal.  The neck is supple.  The carotids reveal no  bruits.  The jugular venous pressure is normal.  The  thyroid is not enlarged.  There is no lymphadenopathy.  The chest is clear to percussion and auscultation.  There are no rales or rhonchi.  Expansion of the chest is symmetrical.  The precordium is quiet.  The first heart sound is normal.  The second heart sound is physiologically split.  There is no murmur gallop rub or click.  There is no abnormal lift or heave.  The abdomen is soft and nontender.  The bowel sounds are normal.  The liver and spleen are not enlarged.  There are no abdominal masses.  There are no abdominal bruits.  Extremities reveal good pedal pulses.  There is no phlebitis or edema.  There is no cyanosis or clubbing.     The skin is warm and dry.  There is no rash.  EKG shows normal sinus rhythm and a pattern of an old inferior myocardial infarction and nonspecific ST-T wave changes    Assessment / Plan: 1.  Ischemic heart disease with prior myocardial infarction 2005.  Subsequent CABG 2005.  Most recent cardiac catheterization 09/12/10 showing all of his grafts were patent. 2.  Benign hypertensive heart disease without heart failure 3. type 2 diabetes mellitus without mention of complication, uncontrolled 4. obesity  Plan: Continue on same medication.  Try to lose weight.  Recheck here in 6 months for followup office visit.

## 2013-05-31 ENCOUNTER — Other Ambulatory Visit: Payer: Self-pay | Admitting: Cardiology

## 2013-06-02 ENCOUNTER — Other Ambulatory Visit: Payer: Self-pay

## 2013-06-02 MED ORDER — CLOPIDOGREL BISULFATE 75 MG PO TABS: ORAL_TABLET | ORAL | Status: DC

## 2013-07-22 ENCOUNTER — Encounter: Payer: Self-pay | Admitting: Family

## 2013-07-23 ENCOUNTER — Encounter: Payer: Self-pay | Admitting: Family

## 2013-07-23 ENCOUNTER — Ambulatory Visit (INDEPENDENT_AMBULATORY_CARE_PROVIDER_SITE_OTHER): Payer: Medicare Other | Admitting: Family

## 2013-07-23 ENCOUNTER — Ambulatory Visit (HOSPITAL_COMMUNITY)
Admission: RE | Admit: 2013-07-23 | Discharge: 2013-07-23 | Disposition: A | Discharge status: Home / Self Care | Payer: Medicare Other | Source: Ambulatory Visit | Attending: Family | Admitting: Family

## 2013-07-23 VITALS — BP 143/80 | HR 63 | Resp 16 | Ht 65.0 in | Wt 208.0 lb

## 2013-07-23 DIAGNOSIS — I7092 Chronic total occlusion of artery of the extremities: Secondary | ICD-10-CM

## 2013-07-23 DIAGNOSIS — I70209 Unspecified atherosclerosis of native arteries of extremities, unspecified extremity: Secondary | ICD-10-CM | POA: Insufficient documentation

## 2013-07-23 DIAGNOSIS — I739 Peripheral vascular disease, unspecified: Secondary | ICD-10-CM

## 2013-07-23 NOTE — Addendum Note (Signed)
Addended by: Mena Goes on: 07/23/2013 02:52 PM   Modules accepted: Orders

## 2013-07-23 NOTE — Progress Notes (Signed)
[facility] [place] HISTORY AND PHYSICAL -PAD  History of Present Illness Hunter [name] is a 63 y.o. male patient of Dr. 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  He returns today for PAD surveillance.  He does not ambulate, therefore cannot report claudication, but he does use his legs to move his wheelchair, and the pain in his leg lower leg is no worse when using his legs.  He denies rest pain in right leg, but states left lower leg "feels like it's on fire almost all the time", this has gradually worsened in the last year from 3/10 to 5/10.  He also reports known left sciatic nerve problems, has had several spinal surgeries.  He also reports that his last  A1C was 7.2%, improved from over 8.5.  Gabapentin helps some of his left leg pain.  He denies non-healing wounds.  He denies history of stroke or TIA symptoms.  He did have veins harvested from both legs for his 4 vessel CABG in 2005, had swelling in both LE's since then, left lower leg more so.  He does wear compression hose on legs.  He reports sciatic pain in left leg, "like a hot poker"; had 6 low back surgeries and 2 c-spine surgeries.  He has declined sleep studies in the past but states he will consider requesting sleep studies; he reports snoring and severe daytime fatigue.    Pt reports New Medical or Surgical History: right cataract extraction with IOL Sept., 2014. Is scheduled to have upper eyelids blepharoplasty In October, 2015. Pt smoker: former smoker, quit 5 years ago  Pt meds include:  Statin :Yes  ASA: Yes  Other anticoagulants/antiplatelets: Plavix   Past Medical History  Diagnosis Date  . CAD (coronary artery disease)     Remote MI with stent in 2005 and CABG in 2005  . COPD (chronic obstructive pulmonary disease)   . Diabetes mellitus   . History of myocardial infarction   . PAD  (peripheral artery disease)     followed by Dr. [name]  . Coronary artery disease   . Myocardial infarction   . Hypertension   . S/P CABG (coronary artery bypass graft) 2005  . Normal nuclear stress test April 2012  . Tobacco dependence in remission   . Increased liver enzymes     due to alcohol use  . Arthritis   . Occlusive disease, arterial     lower extremity  . other     spinal cord damage  . Nocturia     Social History History  Substance Use Topics  . Smoking status: Former Smoker -- 0.50 packs/day for 40 years    Types: Cigarettes    Quit date: 01/01/2009  . Smokeless tobacco: Never Used  . Alcohol Use: Yes     Comment: rare    Family History Family History  Problem Relation Age of Onset  . Other Father     WORK ACCIDENT  . Heart disease Father     Heart Disease before age 23  . Heart attack Mother   . Heart disease Mother   . Cancer Mother   . Diabetes Mother   . Other Brother     SUICIDE  . Heart disease Brother   . Diabetes Brother   . Heart attack Brother   . Coronary artery disease Sister     CABG  . Heart disease Sister   .  Cancer Sister     Lung  . Diabetes Sister   . Heart attack Sister   . Coronary artery disease Brother   . COPD Sister     Past Surgical History  Procedure Laterality Date  . Coronary artery bypass graft  2005    LIMA to LAD, SVG to RCA & PD, SVG to OM 1, SVG to 1st DX; Dr. [name]  . Epispadius correction    . Femur surgery      RIGHT  . Back surgery      FUSIONS  . Spinal tumor    . Laminectomy    . Hand surgery      Left   . Eye surgery      BILATERAL TEAR DUCT  . Eye surgery Right Sept. 2014    Cataract    Allergies  Allergen Reactions  . Codeine Nausea And Vomiting  . Lisinopril     cough  . Ramipril     cough    Current Outpatient Prescriptions  Medication Sig Dispense Refill  . amLODipine (NORVASC) 5 MG tablet Take 1 tablet (5 mg total) by mouth daily.  30 tablet  6  . aspirin 81 MG  tablet Take 81 mg by mouth daily.        [name] atorvastatin (LIPITOR) 20 MG tablet Take 10 mg by mouth daily.       . Canagliflozin (INVOKANA) 100 MG TABS Take by mouth daily.      . cetirizine (ZYRTEC) 10 MG tablet Take 10 mg by mouth daily.      . chlorpheniramine (CHLOR-TABLETS) 4 MG tablet Take 4 mg by mouth daily.        . clopidogrel (PLAVIX) 75 MG tablet TAKE 1 TABLET BY MOUTH EVERY DAY  30 tablet  6  . Docusate Calcium (STOOL SOFTENER PO) Take by mouth daily.        [name] doxazosin (CARDURA) 2 MG tablet Take 2 mg by mouth daily.        [name] gabapentin (NEURONTIN) 300 MG capsule Take 300 mg by mouth 3 (three) times daily.      [name] HUMALOG KWIKPEN 100 UNIT/ML SOPN 11 Units 3 (three) times daily with meals.       [name] LEVEMIR FLEXPEN 100 UNIT/ML SOPN 30 Units 3 (three) times daily.       . metoprolol (LOPRESSOR) 50 MG tablet TAKE 1 TABLET (50 MG TOTAL) BY MOUTH 2 (TWO) TIMES DAILY.  60 tablet  6  . mometasone (NASONEX) 50 MCG/ACT nasal spray 2 sprays by Nasal route as needed.        . nitroGLYCERIN (NITROSTAT) 0.4 MG SL tablet Place 1 tablet (0.4 mg total) under the tongue every 5 (five) minutes as needed.  100 tablet  3  . NOVOFINE 32G X 6 MM MISC       . omeprazole (PRILOSEC) 20 MG capsule Take 40 mg by mouth 2 (two) times daily.      . ONE TOUCH ULTRA TEST test strip 1 each daily.       [name] DELICA LANCETS MISC daily.      [name] oxybutynin (DITROPAN XL) 15 MG 24 hr tablet Take 15 mg by mouth daily.        . traMADol (ULTRAM) 50 MG tablet Take 50 mg by mouth every 6 (six) hours as needed.       . triamterene-hydrochlorothiazide (MAXZIDE-25) 37.5-25 MG per tablet Take 1 tablet by mouth daily.       [name]  ZETIA 10 MG tablet TAKE 1 TABLET (10 MG TOTAL) BY MOUTH DAILY.  30 tablet  1   No current facility-administered medications for this visit.    ROS: See HPI for pertinent positives and negatives.   Physical Examination   Filed Vitals:   07/23/13 1338  BP: 143/80  Pulse: 63  Resp: 16  Height:  5\' 5"  (1.651 m)  Weight: 208 lb (94.348 kg)   Body mass index is 34.61 kg/(m^2).   General: A&O x 3, obese male.  Gait: in wheelchair  Eyes: PERRLA,  Pulmonary: CTAB, without wheezes , rales or rhonchi  Cardiac: regular Rythm , without detected murmur  Carotid Bruits  Left  Right    Negative  Negative   Aorta: is not palpable  Radial pulses: 2+ palpable and =  VASCULAR EXAM:  Extremities without ischemic changes  without Gangrene; without open wounds.  LE Pulses  LEFT  RIGHT   POPLITEAL  not palpable  not palpable   POSTERIOR TIBIAL  not palpable  not palpable   DORSALIS PEDIS  ANTERIOR TIBIAL  not palpable  not palpable   Abdomen: soft, NT, no masses.  Skin: no rashes, no ulcers noted.  Musculoskeletal: no muscle wasting or atrophy.  Neurologic: A&O X 3; Appropriate Affect ; SENSATION: normal; MOTOR FUNCTION: moving all extremities equally, motor strength 5/5 in UE's, 3/5 in LE's. Speech is fluent/normal. CN 2-12 intact.    Non-Invasive Vascular Imaging: DATE: 07/23/2013 ABI: RIGHT 0.69 (0.69), Waveforms: mono and biphasic;  LEFT 0.52 (0.47), Waveforms: monophasic  ASSESSMENT: Hunter [name] is a 63 y.o. male who presents with moderate arterial occlusive disease in RLE and severe arterial occlusive disease in LLE.  He does not have non-healing ulcers.   His atherosclerotic risk factors under medical management are uncontrolled DM which continues to improve, sedentary as confined to his w/c, obesity, dyslipidemia, and possible untreated OSA; he declined sleep studies in the pas, states he will speak with his provider re this.   He will benefit more from medical management than surgical intervention to his LLE at this point, but should he develop a non-healing ulcer he will let us know.  Dr. [name] spoke with and examined pt. He has not been wearing the knee high compression hose that he has to decrease the bilateral LE dependent edema.  PLAN:  Use the knee high  compression hose that he has to decrease the bilateral LE dependent edema. I discussed in depth with the patient the nature of atherosclerosis, and emphasized the importance of maximal medical management including strict control of blood pressure, blood glucose, and lipid levels, obtaining regular exercise, and continued cessation of smoking.  The patient is aware that without maximal medical management the underlying atherosclerotic disease process will progress, limiting the benefit of any interventions.  Based on the patient's vascular studies and examination, pt will return to clinic in 6 months for ABI's. Wear knee high compression hose that he has to decrease the dependent edema in his lower legs and feet.  The patient was given information about PAD including signs, symptoms, treatment, what symptoms should prompt the patient to seek immediate medical care, and risk reduction measures to take.  Clemon Chambers, RN, MSN, FNP-C [facility] Phone: [phone]  Clinic MD: [name]  07/23/2013 1:44 PM

## 2013-07-29 ENCOUNTER — Encounter: Payer: Self-pay | Admitting: Gastroenterology

## 2013-10-20 DIAGNOSIS — H02403 Unspecified ptosis of bilateral eyelids: Secondary | ICD-10-CM | POA: Insufficient documentation

## 2013-10-20 DIAGNOSIS — H02409 Unspecified ptosis of unspecified eyelid: Secondary | ICD-10-CM | POA: Insufficient documentation

## 2013-10-21 DIAGNOSIS — G4733 Obstructive sleep apnea (adult) (pediatric): Secondary | ICD-10-CM | POA: Insufficient documentation

## 2013-10-27 ENCOUNTER — Ambulatory Visit (INDEPENDENT_AMBULATORY_CARE_PROVIDER_SITE_OTHER): Payer: Medicare Other | Admitting: Cardiology

## 2013-10-27 ENCOUNTER — Encounter: Payer: Self-pay | Admitting: Cardiology

## 2013-10-27 VITALS — BP 116/70 | HR 68 | Ht 65.0 in | Wt 212.0 lb

## 2013-10-27 DIAGNOSIS — J438 Other emphysema: Secondary | ICD-10-CM

## 2013-10-27 DIAGNOSIS — I259 Chronic ischemic heart disease, unspecified: Secondary | ICD-10-CM

## 2013-10-27 DIAGNOSIS — I119 Hypertensive heart disease without heart failure: Secondary | ICD-10-CM

## 2013-10-27 NOTE — Assessment & Plan Note (Signed)
Patient has COPD.  He has home oxygen which he uses at night.  He still smokes some.  He is trying to get back on Chantix to quit smoking.

## 2013-10-27 NOTE — Assessment & Plan Note (Signed)
The patient has not been having increasing chest pain.  He carries nitroglycerin with him but has not had to use any.

## 2013-10-27 NOTE — Patient Instructions (Signed)
Your physician recommends that you continue on your current medications as directed. Please refer to the Current Medication list given to you today.  Your physician wants you to follow-up in: 6 MONTH OV /EKG You will receive a reminder letter in the mail two months in advance. If you don't receive a letter, please call our office to schedule the follow-up appointment.  

## 2013-10-27 NOTE — Progress Notes (Signed)
Hunter [name] Date of Birth:  22-Oct-1950 [facility] [address] [place], [place]  [address] [phone]        Fax   [phone]   History of Present Illness: This pleasant 63 year old gentleman is seen for a followup office visit. We last saw him on 10/31/12. He has a history of known ischemic heart disease. He had a myocardial infarction with stent in 2005.  The patient underwent coronary artery bypass graft surgery also in 2005. He had increasing chest pain in September 2012 and underwent cardiac catheterization by Dr. [name] on 09/12/10 showed normal left ventricular systolic function and showed that all of his grafts were patent. Patient does have occasional chest discomfort. He has not had to take any recent sublingual nitroglycerin. He quit smoking 3 years ago and this has helped his breathing and also his chest pain.  His appetite is good and he has been gaining weight.  He is in a wheelchair.  He does not get any regular exercise.  He is being worked up for possible sleep apnea.  Current Outpatient Prescriptions  Medication Sig Dispense Refill  . amLODipine (NORVASC) 5 MG tablet Take 1 tablet (5 mg total) by mouth daily.  30 tablet  6  . aspirin 81 MG tablet Take 81 mg by mouth daily.        [name] atorvastatin (LIPITOR) 20 MG tablet Take 10 mg by mouth daily.       . Canagliflozin (INVOKANA) 100 MG TABS Take by mouth daily.      . cetirizine (ZYRTEC) 10 MG tablet Take 10 mg by mouth daily.      . CHANTIX STARTING MONTH PAK 0.5 MG X 11 & 1 MG X 42 tablet       . chlorpheniramine (CHLOR-TABLETS) 4 MG tablet Take 4 mg by mouth daily.        . clopidogrel (PLAVIX) 75 MG tablet TAKE 1 TABLET BY MOUTH EVERY DAY  30 tablet  6  . Docusate Calcium (STOOL SOFTENER PO) Take by mouth daily.        [name] doxazosin (CARDURA) 2 MG tablet Take 2 mg by mouth daily.        [name] doxycycline (VIBRAMYCIN) 100 MG capsule       . gabapentin (NEURONTIN) 300 MG capsule Take 300 mg by  mouth 3 (three) times daily.      [name] HUMALOG KWIKPEN 100 UNIT/ML SOPN 11 Units 3 (three) times daily with meals.       [name] LEVEMIR FLEXPEN 100 UNIT/ML SOPN 30 Units 3 (three) times daily.       . metoprolol (LOPRESSOR) 50 MG tablet TAKE 1 TABLET (50 MG TOTAL) BY MOUTH 2 (TWO) TIMES DAILY.  60 tablet  6  . mometasone (NASONEX) 50 MCG/ACT nasal spray 2 sprays by Nasal route as needed.        . nitroGLYCERIN (NITROSTAT) 0.4 MG SL tablet Place 1 tablet (0.4 mg total) under the tongue every 5 (five) minutes as needed.  100 tablet  3  . NOVOFINE 32G X 6 MM MISC       . omeprazole (PRILOSEC) 20 MG capsule Take 40 mg by mouth 2 (two) times daily.      . ONE TOUCH ULTRA TEST test strip 1 each daily.       [name] DELICA LANCETS MISC daily.      [name] oxybutynin (DITROPAN XL) 15 MG 24 hr tablet Take 15 mg by mouth  daily.        . traMADol (ULTRAM) 50 MG tablet Take 50 mg by mouth every 6 (six) hours as needed.       . triamterene-hydrochlorothiazide (MAXZIDE-25) 37.5-25 MG per tablet Take 1 tablet by mouth daily.       [name] ZETIA 10 MG tablet TAKE 1 TABLET (10 MG TOTAL) BY MOUTH DAILY.  30 tablet  1   No current facility-administered medications for this visit.    Allergies  Allergen Reactions  . Codeine Nausea And Vomiting  . Lisinopril     cough  . Ramipril     cough    Patient Active Problem List   Diagnosis Date Noted  . Chest pain radiating to arm 03/31/2010    Priority: High  . Spinal cord tumor 03/31/2010    Priority: Medium  . Chronic ischemic heart disease     Priority: Medium  . Venous insufficiency of both lower extremities 04/29/2013  . Peripheral vascular disease, unspecified 01/15/2013  . Hand pain 01/15/2013  . Chronic cough 11/26/2011  . 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 Depression 12/13/2010  . Benign hypertensive heart disease without heart failure 03/31/2010  . COPD (chronic obstructive pulmonary disease)   . Type II or unspecified type  diabetes mellitus without mention of complication, uncontrolled   . PAD (peripheral artery disease)   . MYOCARDIAL INFARCTION 01/28/2008  . ALLERGIC RHINITIS 01/28/2008    History  Smoking status  . Former Smoker -- 0.50 packs/day for 40 years  . Types: Cigarettes  . Quit date: 01/01/2009  Smokeless tobacco  . Never Used    History  Alcohol Use  . Yes    Comment: rare    Family History  Problem Relation Age of Onset  . Other Father     WORK ACCIDENT  . Heart disease Father     Heart Disease before age 76  . Heart attack Mother   . Heart disease Mother   . Cancer Mother   . Diabetes Mother   . Other Brother     SUICIDE  . Heart disease Brother   . Diabetes Brother   . Heart attack Brother   . Coronary artery disease Sister     CABG  . Heart disease Sister   . Cancer Sister     Lung  . Diabetes Sister   . Heart attack Sister   . Coronary artery disease Brother   . COPD Sister     Review of Systems: Constitutional: no fever chills diaphoresis or fatigue or change in weight.  Head and neck: no hearing loss, no epistaxis, no photophobia or visual disturbance. Respiratory: No cough, shortness of breath or wheezing. Cardiovascular: No chest pain peripheral edema, palpitations. Gastrointestinal: No abdominal distention, no abdominal pain, no change in bowel habits hematochezia or melena. Genitourinary: No dysuria, no frequency, no urgency, no nocturia. Musculoskeletal:No arthralgias, no back pain, no gait disturbance or myalgias. Neurological: No dizziness, no headaches, no numbness, no seizures, no syncope, no weakness, no tremors. Hematologic: No lymphadenopathy, no easy bruising. Psychiatric: No confusion, no hallucinations, no sleep disturbance.    Physical Exam: Filed Vitals:   10/27/13 1126  BP: 116/70  Pulse: 68   the general appearance reveals a well-developed well-nourished gentleman who is in a wheelchair.The head and neck exam reveals pupils equal  and reactive.  Extraocular movements are full.  There is no scleral icterus.  The mouth and pharynx are normal.  The neck is  supple.  The carotids reveal no bruits.  The jugular venous pressure is normal.  The  thyroid is not enlarged.  There is no lymphadenopathy.  The chest is clear to percussion and auscultation.  There are no rales or rhonchi.  Expansion of the chest is symmetrical.  The precordium is quiet.  The first heart sound is normal.  The second heart sound is physiologically split.  There is no murmur gallop rub or click.  There is no abnormal lift or heave.  The abdomen is soft and nontender.  The bowel sounds are normal.  The liver and spleen are not enlarged.  There are no abdominal masses.  There are no abdominal bruits.  Extremities reveal good pedal pulses.  There is moderate bilateral pretibial edema.  There is no cyanosis or clubbing.     The skin is warm and dry.  There is no rash.     Assessment / Plan: 1.  Ischemic heart disease with prior myocardial infarction 2005.  Subsequent CABG 2005.  Most recent cardiac catheterization 09/12/10 showing all of his grafts were patent. 2.  Benign hypertensive heart disease without heart failure 3. type 2 diabetes mellitus without mention of complication, uncontrolled 4. Obesity 5. possible sleep apnea 6. bilateral ptosis of eyelids.  He is scheduled for outpatient eyelid surgery at [facility] later this week.  However the surgery may need to be postponed because of recent spider bites to the area of the right side of his forehead. 7. venous insufficiency of legs  Plan: Continue on same medication.  Try to lose weight.  Recheck here in 6 months for followup office visit and EKG

## 2013-10-27 NOTE — Assessment & Plan Note (Signed)
His blood pressure is remaining stable on current therapy.

## 2013-10-30 DIAGNOSIS — Z9981 Dependence on supplemental oxygen: Secondary | ICD-10-CM | POA: Insufficient documentation

## 2013-10-30 DIAGNOSIS — Z01818 Encounter for other preprocedural examination: Secondary | ICD-10-CM | POA: Insufficient documentation

## 2013-10-30 DIAGNOSIS — Z6835 Body mass index (BMI) 35.0-35.9, adult: Secondary | ICD-10-CM | POA: Insufficient documentation

## 2013-10-30 DIAGNOSIS — R29898 Other symptoms and signs involving the musculoskeletal system: Secondary | ICD-10-CM | POA: Insufficient documentation

## 2013-11-03 ENCOUNTER — Emergency Department (HOSPITAL_COMMUNITY): Payer: Medicare Other

## 2013-11-03 ENCOUNTER — Emergency Department (HOSPITAL_COMMUNITY)
Admission: EM | Admit: 2013-11-03 | Discharge: 2013-11-04 | Disposition: A | Discharge status: Home / Self Care | Payer: Medicare Other | Attending: Emergency Medicine | Admitting: Emergency Medicine

## 2013-11-03 ENCOUNTER — Encounter (HOSPITAL_COMMUNITY): Payer: Self-pay | Admitting: *Deleted

## 2013-11-03 DIAGNOSIS — M1990 Unspecified osteoarthritis, unspecified site: Secondary | ICD-10-CM | POA: Diagnosis not present

## 2013-11-03 DIAGNOSIS — Z7951 Long term (current) use of inhaled steroids: Secondary | ICD-10-CM | POA: Diagnosis not present

## 2013-11-03 DIAGNOSIS — Z951 Presence of aortocoronary bypass graft: Secondary | ICD-10-CM | POA: Diagnosis not present

## 2013-11-03 DIAGNOSIS — Z7982 Long term (current) use of aspirin: Secondary | ICD-10-CM | POA: Insufficient documentation

## 2013-11-03 DIAGNOSIS — Z792 Long term (current) use of antibiotics: Secondary | ICD-10-CM | POA: Diagnosis not present

## 2013-11-03 DIAGNOSIS — J449 Chronic obstructive pulmonary disease, unspecified: Secondary | ICD-10-CM | POA: Insufficient documentation

## 2013-11-03 DIAGNOSIS — Z87891 Personal history of nicotine dependence: Secondary | ICD-10-CM | POA: Diagnosis not present

## 2013-11-03 DIAGNOSIS — I10 Essential (primary) hypertension: Secondary | ICD-10-CM | POA: Diagnosis not present

## 2013-11-03 DIAGNOSIS — I252 Old myocardial infarction: Secondary | ICD-10-CM | POA: Insufficient documentation

## 2013-11-03 DIAGNOSIS — I2510 Atherosclerotic heart disease of native coronary artery without angina pectoris: Secondary | ICD-10-CM | POA: Diagnosis not present

## 2013-11-03 DIAGNOSIS — E119 Type 2 diabetes mellitus without complications: Secondary | ICD-10-CM | POA: Insufficient documentation

## 2013-11-03 DIAGNOSIS — Z7902 Long term (current) use of antithrombotics/antiplatelets: Secondary | ICD-10-CM | POA: Insufficient documentation

## 2013-11-03 DIAGNOSIS — Z79899 Other long term (current) drug therapy: Secondary | ICD-10-CM | POA: Diagnosis not present

## 2013-11-03 DIAGNOSIS — M542 Cervicalgia: Secondary | ICD-10-CM

## 2013-11-03 LAB — CBC WITH DIFFERENTIAL/PLATELET
BASOS PCT: 0 % (ref 0–1)
Basophils Absolute: 0 10*3/uL (ref 0.0–0.1)
Eosinophils Absolute: 0.2 10*3/uL (ref 0.0–0.7)
Eosinophils Relative: 2 % (ref 0–5)
HCT: 47.4 % (ref 39.0–52.0)
HEMOGLOBIN: 15.4 g/dL (ref 13.0–17.0)
LYMPHS ABS: 1.4 10*3/uL (ref 0.7–4.0)
LYMPHS PCT: 20 % (ref 12–46)
MCH: 30.8 pg (ref 26.0–34.0)
MCHC: 32.5 g/dL (ref 30.0–36.0)
MCV: 94.8 fL (ref 78.0–100.0)
MONOS PCT: 13 % — AB (ref 3–12)
Monocytes Absolute: 0.9 10*3/uL (ref 0.1–1.0)
NEUTROS ABS: 4.4 10*3/uL (ref 1.7–7.7)
NEUTROS PCT: 65 % (ref 43–77)
Platelets: 173 10*3/uL (ref 150–400)
RBC: 5 MIL/uL (ref 4.22–5.81)
RDW: 13.2 % (ref 11.5–15.5)
WBC: 6.8 10*3/uL (ref 4.0–10.5)

## 2013-11-03 LAB — COMPREHENSIVE METABOLIC PANEL
ALBUMIN: 3.8 g/dL (ref 3.5–5.2)
ALK PHOS: 55 U/L (ref 39–117)
ALT: 25 U/L (ref 0–53)
ANION GAP: 12 (ref 5–15)
AST: 24 U/L (ref 0–37)
BILIRUBIN TOTAL: 0.2 mg/dL — AB (ref 0.3–1.2)
BUN: 22 mg/dL (ref 6–23)
CHLORIDE: 102 meq/L (ref 96–112)
CO2: 27 mEq/L (ref 19–32)
Calcium: 9.3 mg/dL (ref 8.4–10.5)
Creatinine, Ser: 0.99 mg/dL (ref 0.50–1.35)
GFR calc Af Amer: >90 mL/min (ref >90)
GFR calc non Af Amer: 85 mL/min — ABNORMAL LOW (ref >90)
GLUCOSE: 158 mg/dL — AB (ref 70–99)
POTASSIUM: 5.4 meq/L — AB (ref 3.7–5.3)
Sodium: 141 mEq/L (ref 137–147)
Total Protein: 7.8 g/dL (ref 6.0–8.3)

## 2013-11-03 MED ORDER — ORPHENADRINE CITRATE 30 MG/ML IJ SOLN: 60.0000 mg | Freq: Two times a day (BID) | INTRAMUSCULAR | Status: DC

## 2013-11-03 MED ORDER — KETOROLAC TROMETHAMINE 15 MG/ML IJ SOLN
15.0000 mg | Freq: Once | INTRAMUSCULAR | Status: AC
  Administered 2013-11-03: 15 mg via INTRAVENOUS
  Filled 2013-11-03: qty 1

## 2013-11-03 MED ORDER — IOHEXOL 300 MG/ML  SOLN
75.0000 mL | Freq: Once | INTRAMUSCULAR | Status: AC | PRN
  Administered 2013-11-03: 75 mL via INTRAVENOUS

## 2013-11-03 MED ORDER — OXYCODONE-ACETAMINOPHEN 5-325 MG PO TABS: 1.0000 | ORAL_TABLET | ORAL | Status: DC | PRN

## 2013-11-03 NOTE — ED Notes (Signed)
MD at bedside. 

## 2013-11-03 NOTE — ED Notes (Signed)
Attempted to draw pts labs was unsuccessful called phlebotomy and spoke with stephen

## 2013-11-03 NOTE — ED Notes (Signed)
MD Taylor at bedside.

## 2013-11-03 NOTE — ED Notes (Signed)
Pt in c/o left sided neck pain with swelling, worse when swallowing or movement, unsure of fever, no distress noted

## 2013-11-04 NOTE — ED Provider Notes (Signed)
CSN: [id number]     Arrival date & time 11/03/13  1915 History   First MD Initiated Contact with Patient 11/03/13 2023     Chief Complaint  Patient presents with  . Neck Pain     (Consider location/radiation/quality/duration/timing/severity/associated sxs/prior Treatment) Patient is a 63 y.o. male presenting with neck pain. The history is provided by the patient. No language interpreter was used.  Neck Pain Pain location:  L side Quality:  Aching and stiffness Stiffness is present:  All day Pain radiates to:  L scapula Pain severity:  Severe Pain is:  Same all the time Onset quality:  Gradual Duration:  4 days Timing:  Constant Progression:  Unchanged Chronicity:  New Context: not fall and not recent injury   Context comment:  Noted on waking Relieved by: tylenol. Worsened by:  Bending and swallowing Associated symptoms: no chest pain, no fever, no numbness, no syncope, no visual change and no weakness   Risk factors: no hx of spinal trauma and no recent head injury   Risk factors comment:  Prior cervical fusion   Past Medical History  Diagnosis Date  . CAD (coronary artery disease)     Remote MI with stent in 2005 and CABG in 2005  . COPD (chronic obstructive pulmonary disease)   . Diabetes mellitus   . History of myocardial infarction   . PAD (peripheral artery disease)     followed by Dr. [name]  . Coronary artery disease   . Myocardial infarction   . Hypertension   . S/P CABG (coronary artery bypass graft) 2005  . Normal nuclear stress test April 2012  . Tobacco dependence in remission   . Increased liver enzymes     due to alcohol use  . Arthritis   . Occlusive disease, arterial     lower extremity  . other     spinal cord damage  . Nocturia    Past Surgical History  Procedure Laterality Date  . Coronary artery bypass graft  2005    LIMA to LAD, SVG to RCA & PD, SVG to OM 1, SVG to 1st DX; Dr. [name]  . Epispadius correction    . Femur surgery       RIGHT  . Back surgery      FUSIONS  . Spinal tumor    . Laminectomy    . Hand surgery      Left   . Eye surgery      BILATERAL TEAR DUCT  . Eye surgery Right Sept. 2014    Cataract   Family History  Problem Relation Age of Onset  . Other Father     WORK ACCIDENT  . Heart disease Father     Heart Disease before age 64  . Heart attack Mother   . Heart disease Mother   . Cancer Mother   . Diabetes Mother   . Other Brother     SUICIDE  . Heart disease Brother   . Diabetes Brother   . Heart attack Brother   . Coronary artery disease Sister     CABG  . Heart disease Sister   . Cancer Sister     Lung  . Diabetes Sister   . Heart attack Sister   . Coronary artery disease Brother   . COPD Sister    History  Substance Use Topics  . Smoking status: Former Smoker -- 0.50 packs/day for 40 years    Types: Cigarettes    Quit date: 01/01/2009  .  Smokeless tobacco: Never Used  . Alcohol Use: Yes     Comment: rare    Review of Systems  Constitutional: Negative for fever, chills and appetite change.  HENT: Negative for facial swelling, trouble swallowing and voice change.   Eyes: Negative for visual disturbance.  Respiratory: Negative for chest tightness, shortness of breath and stridor.   Cardiovascular: Negative for chest pain and syncope.  Musculoskeletal: Positive for neck pain and neck stiffness. Negative for arthralgias.  Skin: Negative for rash and wound.  Allergic/Immunologic: Negative for immunocompromised state.  Neurological: Negative for dizziness, facial asymmetry, speech difficulty, weakness and numbness.  All other systems reviewed and are negative.     Allergies  Codeine; Lisinopril; Ramipril; and Tape  Home Medications   Prior to Admission medications   Medication Sig Start Date End Date Taking? Authorizing Provider  aspirin 81 MG tablet Take 81 mg by mouth daily.     Yes Historical Provider, MD  atorvastatin (LIPITOR) 20 MG tablet Take 10 mg by  mouth daily.    Yes Historical Provider, MD  Canagliflozin (INVOKANA) 100 MG TABS Take by mouth daily.   Yes Historical Provider, MD  cetirizine (ZYRTEC) 10 MG tablet Take 10 mg by mouth daily.   Yes Historical Provider, MD  CHANTIX STARTING MONTH PAK 0.5 MG X 11 & 1 MG X 42 tablet  10/22/13  Yes Historical Provider, MD  clopidogrel (PLAVIX) 75 MG tablet TAKE 1 TABLET BY MOUTH EVERY DAY 06/02/13  Yes [name], MD  Docusate Calcium (STOOL SOFTENER PO) Take 100 mg by mouth daily.    Yes Historical Provider, MD  doxazosin (CARDURA) 2 MG tablet Take 2 mg by mouth daily.     Yes Historical Provider, MD  erythromycin ophthalmic ointment Place 1 application into both eyes 2 (two) times daily.   Yes Historical Provider, MD  gabapentin (NEURONTIN) 300 MG capsule Take 300 mg by mouth 2 (two) times daily.    Yes Historical Provider, MD  HUMALOG KWIKPEN 100 UNIT/ML SOPN 16 Units 3 (three) times daily with meals.  09/13/12  Yes Historical Provider, MD  ipratropium (ATROVENT) 0.06 % nasal spray Place 2 sprays into both nostrils 4 (four) times daily as needed for rhinitis.   Yes Historical Provider, MD  LEVEMIR FLEXPEN 100 UNIT/ML SOPN Inject 30 Units into the skin daily.  09/13/12  Yes Historical Provider, MD  metoprolol (LOPRESSOR) 50 MG tablet TAKE 1 TABLET (50 MG TOTAL) BY MOUTH 2 (TWO) TIMES DAILY.   Yes [name], MD  mometasone (NASONEX) 50 MCG/ACT nasal spray Place 2 sprays into the nose daily as needed (for nasal congestion).    Yes Historical Provider, MD  nitroGLYCERIN (NITROSTAT) 0.4 MG SL tablet Place 1 tablet (0.4 mg total) under the tongue every 5 (five) minutes as needed. 03/31/10  Yes [name], MD  NOVOFINE 32G X 6 MM MISC  12/04/12  Yes Historical Provider, MD  omeprazole (PRILOSEC) 20 MG capsule Take 40 mg by mouth 2 (two) times daily. 11/26/11  Yes Kathee Delton, MD  omeprazole (PRILOSEC) 40 MG capsule Take 40 mg by mouth daily.   Yes Historical Provider, MD  ONE TOUCH ULTRA  TEST test strip 1 each daily.  11/04/11  Yes Historical Provider, MD  Jonetta Speak LANCETS MISC daily. 11/04/11  Yes Historical Provider, MD  oxybutynin (DITROPAN XL) 15 MG 24 hr tablet Take 15 mg by mouth daily.     Yes Historical Provider, MD  traMADol (ULTRAM) 50 MG tablet Take 50  mg by mouth every 6 (six) hours as needed for moderate pain.  03/03/12  Yes [name], MD  triamterene-hydrochlorothiazide (MAXZIDE-25) 37.5-25 MG per tablet Take 1 tablet by mouth daily.  10/03/12  Yes Historical Provider, MD  ZETIA 10 MG tablet TAKE 1 TABLET (10 MG TOTAL) BY MOUTH DAILY. 08/05/12  Yes [name], MD  amLODipine (NORVASC) 5 MG tablet Take 1 tablet (5 mg total) by mouth daily. Patient not taking: Reported on 11/03/2013 01/04/12   [name], MD  chlorpheniramine (CHLOR-TABLETS) 4 MG tablet Take 4 mg by mouth daily.      Historical Provider, MD  doxycycline (VIBRAMYCIN) 100 MG capsule  10/21/13   Historical Provider, MD  oxyCODONE-acetaminophen (PERCOCET/ROXICET) 5-325 MG per tablet Take 1-2 tablets by mouth every 4 (four) hours as needed for moderate pain or severe pain. 11/03/13   Amparo Bristol, MD   BP 151/90 mmHg  Pulse 64  Temp(Src) 97.9 F (36.6 C) (Oral)  Resp 16  SpO2 100% Physical Exam  Constitutional: He is oriented to person, place, and time. He appears well-developed and well-nourished. No distress.  HENT:  Head: Normocephalic and atraumatic.  Mouth/Throat: Oropharynx is clear and moist.  Eyes: EOM are normal. Pupils are equal, round, and reactive to light.  Neck: Muscular tenderness present. No spinous process tenderness present. Carotid bruit is not present. No tracheal deviation present. No thyroid mass present.  Significant subcutaneous tissue of neck, no palpable masses. Tenderness on palpation along the trapezius muscles  Cardiovascular: Normal rate, regular rhythm and normal heart sounds.   Pulmonary/Chest: Effort normal and breath sounds normal. No stridor.   Abdominal: Soft. There is no tenderness.  Musculoskeletal:       Right shoulder: Normal.       Left shoulder: Normal.       Cervical back: He exhibits tenderness (left paraspinal). He exhibits no bony tenderness, no deformity and no spasm.  Equal grip strength.   Lymphadenopathy:    He has no cervical adenopathy.  Neurological: He is alert and oriented to person, place, and time. He has normal strength. No sensory deficit.  Vitals reviewed.   ED Course  Procedures (including critical care time) Labs Review Labs Reviewed  CBC WITH DIFFERENTIAL - Abnormal; Notable for the following:    Monocytes Relative 13 (*)    All other components within normal limits  COMPREHENSIVE METABOLIC PANEL - Abnormal; Notable for the following:    Potassium 5.4 (*)    Glucose, Bld 158 (*)    Total Bilirubin 0.2 (*)    GFR calc non Af Amer 85 (*)    All other components within normal limits    Imaging Review Ct Soft Tissue Neck W Contrast  11/03/2013   CLINICAL DATA:  Left-sided neck pain with swelling, worse with swallowing or movement for 4 days.  EXAM: CT NECK WITH CONTRAST  TECHNIQUE: Multidetector CT imaging of the neck was performed using the standard protocol following the bolus administration of intravenous contrast.  CONTRAST:  35mL OMNIPAQUE IOHEXOL 300 MG/ML  SOLN  COMPARISON:  None.  FINDINGS: No soft tissue mass, fluid collection, or infiltrative changes identified. Mucosal and prevertebral spaces appear intact. Fat planes are distinct. No displacement of the cervical carotid or jugular vessels. Laryngeal structures appear intact. Normal size and enhancement of the thyroid gland. Cervical lymph nodes are not pathologically enlarged. Salivary glands appear symmetrical. Prominent calcification demonstrated in the carotid and internal carotid arteries bilaterally with probable significant stenosis suggested bilaterally, greater on the  left. Visualize vessels appear patent. Lung apices are clear.  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  IMPRESSION: No mass, lymphadenopathy, or abscess demonstrated in the neck.   Electronically Signed   By: Lucienne Capers M.D.   On: 11/03/2013 23:14     EKG Interpretation None      MDM   Final diagnoses:  Neck pain    62 y/o male with h/o prior cervical fusion presenting with left lateral neck pain and stiffness that began on waking. Also noting pain worse with swallowing. Exam limited due to body habitus. OP clear and no stridor or respiratory distress. Suspect musculoskeletal cause but will get CT to r/o deep infection due to limited exam.   CT without mass or abscess. Plan for symptomatic management of neck strain. Will f/u with Pt's Neurosurgeon if symptoms persist.     Amparo Bristol, MD 11/04/13 [place], MD 11/06/13 1525

## 2014-01-02 ENCOUNTER — Other Ambulatory Visit: Payer: Self-pay | Admitting: Cardiology

## 2014-01-27 ENCOUNTER — Encounter: Payer: Self-pay | Admitting: Family

## 2014-01-28 ENCOUNTER — Encounter (HOSPITAL_COMMUNITY): Payer: Medicare Other

## 2014-01-28 ENCOUNTER — Ambulatory Visit: Payer: Medicare Other | Admitting: Family

## 2014-02-01 ENCOUNTER — Other Ambulatory Visit: Payer: Self-pay | Admitting: Cardiology

## 2014-02-03 ENCOUNTER — Encounter: Payer: Self-pay | Admitting: Family

## 2014-02-05 ENCOUNTER — Encounter: Payer: Self-pay | Admitting: Family

## 2014-02-05 ENCOUNTER — Ambulatory Visit (INDEPENDENT_AMBULATORY_CARE_PROVIDER_SITE_OTHER): Payer: Medicare Other | Admitting: Family

## 2014-02-05 ENCOUNTER — Ambulatory Visit (HOSPITAL_COMMUNITY)
Admission: RE | Admit: 2014-02-05 | Discharge: 2014-02-05 | Disposition: A | Discharge status: Home / Self Care | Payer: Medicare Other | Source: Ambulatory Visit | Attending: Family | Admitting: Family

## 2014-02-05 VITALS — BP 149/59 | HR 66 | Resp 18 | Ht 65.0 in | Wt 200.0 lb

## 2014-02-05 DIAGNOSIS — I739 Peripheral vascular disease, unspecified: Secondary | ICD-10-CM

## 2014-02-05 DIAGNOSIS — Z87891 Personal history of nicotine dependence: Secondary | ICD-10-CM

## 2014-02-05 DIAGNOSIS — I7092 Chronic total occlusion of artery of the extremities: Secondary | ICD-10-CM | POA: Insufficient documentation

## 2014-02-05 DIAGNOSIS — E785 Hyperlipidemia, unspecified: Secondary | ICD-10-CM | POA: Insufficient documentation

## 2014-02-05 DIAGNOSIS — L03116 Cellulitis of left lower limb: Secondary | ICD-10-CM

## 2014-02-05 DIAGNOSIS — I10 Essential (primary) hypertension: Secondary | ICD-10-CM | POA: Diagnosis not present

## 2014-02-05 DIAGNOSIS — E119 Type 2 diabetes mellitus without complications: Secondary | ICD-10-CM | POA: Diagnosis not present

## 2014-02-05 DIAGNOSIS — E669 Obesity, unspecified: Secondary | ICD-10-CM

## 2014-02-05 DIAGNOSIS — I872 Venous insufficiency (chronic) (peripheral): Secondary | ICD-10-CM

## 2014-02-05 MED ORDER — CEPHALEXIN 500 MG PO CAPS: 500.0000 mg | ORAL_CAPSULE | Freq: Three times a day (TID) | ORAL | Status: DC

## 2014-02-05 NOTE — Progress Notes (Signed)
[facility] [place] HISTORY AND PHYSICAL -PAD  History of Present Illness Hunter [name] is a 64 y.o. male patient of Dr. 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  He returns today for PAD surveillance.  He does not ambulate, therefore cannot report claudication, but he does use his legs to move his wheelchair, and the pain in his leg lower leg is no worse when using his legs.  He denies rest pain in right leg, but states left lower leg "feels like it's on fire almost all the time", this has gradually worsened in the last year from 3/10 to 5/10.   He also reports that his last A1C was 7.2%, improved from over 8.5.  Gabapentin helps some of his left leg pain.  He denies non-healing wounds.  He denies history of stroke or TIA symptoms.  He did have veins harvested from both legs for his 4 vessel CABG in 2005, had swelling in both LE's since then, left lower leg more so.  He does wear compression hose on legs.  He reports sciatic pain in left leg, "like a hot poker"; had 6 low back surgeries and 2 c-spine surgeries.  He had sleep studies recently, is using CPAP, and feels more rested, no more daytime fatigue.   Pt reports New Medical or Surgical History: right cataract extraction with IOL Sept., 2014. Had bilateral blepharoplasty. Had injections in shoulders for pain. He admits to not using his knee high compression hose unless he leaves his house.  Pt smoker: former smoker, quit in 2011 Pt meds include:  Statin :Yes  ASA: Yes  Other anticoagulants/antiplatelets: Plavix     Past Medical History  Diagnosis Date  . CAD (coronary artery disease)     Remote MI with stent in 2005 and CABG in 2005  . COPD (chronic obstructive pulmonary disease)   . Diabetes mellitus   . History of myocardial infarction   . PAD (peripheral artery disease)     followed by  Dr. [name]  . Coronary artery disease   . Myocardial infarction   . Hypertension   . S/P CABG (coronary artery bypass graft) 2005  . Normal nuclear stress test April 2012  . Tobacco dependence in remission   . Increased liver enzymes     due to alcohol use  . Arthritis   . Occlusive disease, arterial     lower extremity  . other     spinal cord damage  . Nocturia     Social History History  Substance Use Topics  . Smoking status: Former Smoker -- 0.50 packs/day for 40 years    Types: Cigarettes    Quit date: 01/01/2009  . Smokeless tobacco: Never Used  . Alcohol Use: Yes     Comment: rare    Family History Family History  Problem Relation Age of Onset  . Other Father     WORK ACCIDENT  . Heart disease Father     Heart Disease before age 63  . Heart attack Mother   . Heart disease Mother   . Cancer Mother   . Diabetes Mother   . Other Brother     SUICIDE  . Heart disease Brother   . Diabetes Brother   . Heart attack Brother   . Coronary artery disease Sister     CABG  . Heart disease Sister   . Cancer Sister  Lung  . Diabetes Sister   . Heart attack Sister   . Coronary artery disease Brother   . COPD Sister     Past Surgical History  Procedure Laterality Date  . Coronary artery bypass graft  2005    LIMA to LAD, SVG to RCA & PD, SVG to OM 1, SVG to 1st DX; Dr. [name]  . Epispadius correction    . Femur surgery      RIGHT  . Back surgery      FUSIONS  . Spinal tumor    . Laminectomy    . Hand surgery      Left   . Eye surgery      BILATERAL TEAR DUCT  . Eye surgery Right Sept. 2014    Cataract    Allergies  Allergen Reactions  . Codeine Nausea And Vomiting  . Lisinopril     cough  . Ramipril     cough  . Tape     Blisters    Current Outpatient Prescriptions  Medication Sig Dispense Refill  . amLODipine (NORVASC) 5 MG tablet Take 1 tablet (5 mg total) by mouth daily. (Patient not taking: Reported on 11/03/2013) 30 tablet 6  .  aspirin 81 MG tablet Take 81 mg by mouth daily.      [name] atorvastatin (LIPITOR) 20 MG tablet Take 10 mg by mouth daily.     . Canagliflozin (INVOKANA) 100 MG TABS Take by mouth daily.    . cetirizine (ZYRTEC) 10 MG tablet Take 10 mg by mouth daily.    . CHANTIX STARTING MONTH PAK 0.5 MG X 11 & 1 MG X 42 tablet     . chlorpheniramine (CHLOR-TABLETS) 4 MG tablet Take 4 mg by mouth daily.      . clopidogrel (PLAVIX) 75 MG tablet TAKE 1 TABLET BY MOUTH EVERY DAY 30 tablet 1  . Docusate Calcium (STOOL SOFTENER PO) Take 100 mg by mouth daily.     [name] doxazosin (CARDURA) 2 MG tablet Take 2 mg by mouth daily.      [name] doxycycline (VIBRAMYCIN) 100 MG capsule     . erythromycin ophthalmic ointment Place 1 application into both eyes 2 (two) times daily.    [name] gabapentin (NEURONTIN) 300 MG capsule Take 300 mg by mouth 2 (two) times daily.     [name] HUMALOG KWIKPEN 100 UNIT/ML SOPN 16 Units 3 (three) times daily with meals.     [name] ipratropium (ATROVENT) 0.06 % nasal spray Place 2 sprays into both nostrils 4 (four) times daily as needed for rhinitis.    [name] LEVEMIR FLEXPEN 100 UNIT/ML SOPN Inject 30 Units into the skin daily.     . metoprolol (LOPRESSOR) 50 MG tablet TAKE 1 TABLET (50 MG TOTAL) BY MOUTH 2 (TWO) TIMES DAILY. 60 tablet 3  . mometasone (NASONEX) 50 MCG/ACT nasal spray Place 2 sprays into the nose daily as needed (for nasal congestion).     . nitroGLYCERIN (NITROSTAT) 0.4 MG SL tablet Place 1 tablet (0.4 mg total) under the tongue every 5 (five) minutes as needed. 100 tablet 3  . NOVOFINE 32G X 6 MM MISC     . omeprazole (PRILOSEC) 20 MG capsule Take 40 mg by mouth 2 (two) times daily.    [name] omeprazole (PRILOSEC) 40 MG capsule Take 40 mg by mouth daily.    . ONE TOUCH ULTRA TEST test strip 1 each daily.     [name] DELICA LANCETS MISC daily.    [name] oxybutynin (  DITROPAN XL) 15 MG 24 hr tablet Take 15 mg by mouth daily.      [name] oxyCODONE-acetaminophen (PERCOCET/ROXICET) 5-325 MG per tablet Take 1-2 tablets by  mouth every 4 (four) hours as needed for moderate pain or severe pain. 12 tablet 0  . traMADol (ULTRAM) 50 MG tablet Take 50 mg by mouth every 6 (six) hours as needed for moderate pain.     [name] triamterene-hydrochlorothiazide (MAXZIDE-25) 37.5-25 MG per tablet Take 1 tablet by mouth daily.     [name] ZETIA 10 MG tablet TAKE 1 TABLET (10 MG TOTAL) BY MOUTH DAILY. 30 tablet 1   No current facility-administered medications for this visit.    ROS: See HPI for pertinent positives and negatives.   Physical Examination  Filed Vitals:   02/05/14 1454  BP: 149/59  Pulse: 66  Resp: 18  Height: 5\' 5"  (1.651 m)  Weight: 200 lb (90.719 kg)   Body mass index is 33.28 kg/(m^2).  General: A&O x 3, obese male.  Gait: in wheelchair  Eyes: PERRLA,  Pulmonary: CTAB, without wheezes , rales or rhonchi  Cardiac: regular Rythm , without detected murmur   Carotid Bruits  Left  Right    Negative  Negative    Aorta: is not palpable  Radial pulses: 2+ palpable and =   VASCULAR EXAM:  Extremities without ischemic changes  without Gangrene; without open wounds. 2-3+ pitting edema at dorsum of left foot with shallow lesion that appears like an abrasion. 2+ pitting edema left left lower leg, mildly erythematous lesion mid tibial area. Trace pitting edema in right lower leg.  LE Pulses  LEFT  RIGHT   FEMORAL Not palpable Not palpable  POPLITEAL  not palpable  not palpable   POSTERIOR TIBIAL  not palpable  not palpable   DORSALIS PEDIS  ANTERIOR TIBIAL  not palpable  not palpable    Abdomen: soft, NT, no palpable masses.  Skin: no rashes, no ulcers noted.  Musculoskeletal: no muscle wasting or atrophy.  Neurologic: A&O X 3; Appropriate Affect ; SENSATION: normal; MOTOR FUNCTION: moving all extremities equally, motor strength 5/5 in UE's, 3/5 in LE's. Speech is fluent/normal. CN 2-12 intact.   Non-Invasive Vascular Imaging: DATE: 02/05/2014 ABI: RIGHT 0.58 (07/23/13, 0.63),  Waveforms: monophasic;  LEFT 0.57 (0.52), Waveforms: monophasic   ASSESSMENT: CONWAY FEDORA is a 64 y.o. male who presents with stable and continued bilateral lower extremity severe arterial occlusive disease. He has had chronic venous insufficiency since veins harvested from both legs for CABG, more edema in left LE. He has not been wearing knee high compression hose much.  Mild cellulitis left foot that appears to be from abrasion from shoe: Keflex 500 mg tid x 10 days, follow up with Dr. [name] in 2 weeks, pt advised to let us know if worse. PAD: see Plan. Venous Insufficiency: knee high compression hose during the day, elevate feet above heart for 20 minute, 2-3x/day. Dr. Bridgett Larsson spoke with and examined pt.  Face to face time with patient was 25 minutes. Over 50% of this time was spent on counseling and coordination of care.   PLAN:  I discussed in depth with the patient the nature of atherosclerosis, and emphasized the importance of maximal medical management including strict control of blood pressure, blood glucose, and lipid levels, obtaining regular exercise, and continued cessation of smoking.  The patient is aware that without maximal medical management the underlying atherosclerotic disease process will progress, limiting the benefit of any interventions.  Based  on the patient's vascular studies and examination, pt will return to clinic in 2 weeks, follow up with Dr. [name] to evaluate left foot cellulitis, he knows to call sooner if foot infections seems worse; follow up in 6 months with NP for ABI's. Continues leg exercises (unable to ambulate).   The patient was given information about PAD including signs, symptoms, treatment, what symptoms should prompt the patient to seek immediate medical care, and risk reduction measures to take.  Clemon Chambers, RN, MSN, FNP-C [facility] Phone: [phone]  Clinic MD: Bridgett Larsson  02/05/2014 2:35  PM

## 2014-02-05 NOTE — Patient Instructions (Addendum)
Peripheral Vascular Disease Peripheral Vascular Disease (PVD), also called Peripheral Arterial Disease (PAD), is a circulation problem caused by cholesterol (atherosclerotic plaque) deposits in the arteries. PVD commonly occurs in the lower extremities (legs) but it can occur in other areas of the body, such as your arms. The cholesterol buildup in the arteries reduces blood flow which can cause pain and other serious problems. The presence of PVD can place a person at risk for Coronary Artery Disease (CAD).  CAUSES  Causes of PVD can be many. It is usually associated with more than one risk factor such as:   High Cholesterol.  Smoking.  Diabetes.  Lack of exercise or inactivity.  High blood pressure (hypertension).  Obesity.  Family history. SYMPTOMS   When the lower extremities are affected, patients with PVD may experience:  Leg pain with exertion or physical activity. This is called INTERMITTENT CLAUDICATION. This may present as cramping or numbness with physical activity. The location of the pain is associated with the level of blockage. For example, blockage at the abdominal level (distal abdominal aorta) may result in buttock or hip pain. Lower leg arterial blockage may result in calf pain.  As PVD becomes more severe, pain can develop with less physical activity.  In people with severe PVD, leg pain may occur at rest.  Other PVD signs and symptoms:  Leg numbness or weakness.  Coldness in the affected leg or foot, especially when compared to the other leg.  A change in leg color.  Patients with significant PVD are more prone to ulcers or sores on toes, feet or legs. These may take longer to heal or may reoccur. The ulcers or sores can become infected.  If signs and symptoms of PVD are ignored, gangrene may occur. This can result in the loss of toes or loss of an entire limb.  Not all leg pain is related to PVD. Other medical conditions can cause leg pain such  as:  Blood clots (embolism) or Deep Vein Thrombosis.  Inflammation of the blood vessels (vasculitis).  Spinal stenosis. DIAGNOSIS  Diagnosis of PVD can involve several different types of tests. These can include:  Pulse Volume Recording Method (PVR). This test is simple, painless and does not involve the use of X-rays. PVR involves measuring and comparing the blood pressure in the arms and legs. An ABI (Ankle-Brachial Index) is calculated. The normal ratio of blood pressures is 1. As this number becomes smaller, it indicates more severe disease.  < 0.95 - indicates significant narrowing in one or more leg vessels.  <0.8 - there will usually be pain in the foot, leg or buttock with exercise.  <0.4 - will usually have pain in the legs at rest.  <0.25 - usually indicates limb threatening PVD.  Doppler detection of pulses in the legs. This test is painless and checks to see if you have a pulses in your legs/feet.  A dye or contrast material (a substance that highlights the blood vessels so they show up on x-ray) may be given to help your caregiver better see the arteries for the following tests. The dye is eliminated from your body by the kidney's. Your caregiver may order blood work to check your kidney function and other laboratory values before the following tests are performed:  Magnetic Resonance Angiography (MRA). An MRA is a picture study of the blood vessels and arteries. The MRA machine uses a large magnet to produce images of the blood vessels.  Computed Tomography Angiography (CTA). A CTA   is a specialized x-ray that looks at how the blood flows in your blood vessels. An IV may be inserted into your arm so contrast dye can be injected.  Angiogram. Is a procedure that uses x-rays to look at your blood vessels. This procedure is minimally invasive, meaning a small incision (cut) is made in your groin. A small tube (catheter) is then inserted into the artery of your groin. The catheter  is guided to the blood vessel or artery your caregiver wants to examine. Contrast dye is injected into the catheter. X-rays are then taken of the blood vessel or artery. After the images are obtained, the catheter is taken out. TREATMENT  Treatment of PVD involves many interventions which may include:  Lifestyle changes:  Quitting smoking.  Exercise.  Following a low fat, low cholesterol diet.  Control of diabetes.  Foot care is very important to the PVD patient. Good foot care can help prevent infection.  Medication:  Cholesterol-lowering medicine.  Blood pressure medicine.  Anti-platelet drugs.  Certain medicines may reduce symptoms of Intermittent Claudication.  Interventional/Surgical options:  Angioplasty. An Angioplasty is a procedure that inflates a balloon in the blocked artery. This opens the blocked artery to improve blood flow.  Stent Implant. A wire mesh tube (stent) is placed in the artery. The stent expands and stays in place, allowing the artery to remain open.  Peripheral Bypass Surgery. This is a surgical procedure that reroutes the blood around a blocked artery to help improve blood flow. This type of procedure may be performed if Angioplasty or stent implants are not an option. SEEK IMMEDIATE MEDICAL CARE IF:   You develop pain or numbness in your arms or legs.  Your arm or leg turns cold, becomes blue in color.  You develop redness, warmth, swelling and pain in your arms or legs. MAKE SURE YOU:   Understand these instructions.  Will watch your condition.  Will get help right away if you are not doing well or get worse. Document Released: 01/26/2004 Document Revised: 03/12/2011 Document Reviewed: 12/23/2007 Exit[employer] Patient Information 2015 [employer]. This information is not intended to replace advice given to you by your health care provider. Make sure you discuss any questions you have with your health care provider.   Venous Stasis or  Chronic Venous Insufficiency Chronic venous insufficiency, also called venous stasis, is a condition that affects the veins in the legs. The condition prevents blood from being pumped through these veins effectively. Blood may no longer be pumped effectively from the legs back to the heart. This condition can range from mild to severe. With proper treatment, you should be able to continue with an active life. CAUSES  Chronic venous insufficiency occurs when the vein walls become stretched, weakened, or damaged or when valves within the vein are damaged. Some common causes of this include:  High blood pressure inside the veins (venous hypertension).  Increased blood pressure in the leg veins from long periods of sitting or standing.  A blood clot that blocks blood flow in a vein (deep vein thrombosis).  Inflammation of a superficial vein (phlebitis) that causes a blood clot to form. RISK FACTORS Various things can make you more likely to develop chronic venous insufficiency, including:  Family history of this condition.  Obesity.  Pregnancy.  Sedentary lifestyle.  Smoking.  Jobs requiring long periods of standing or sitting in one place.  Being a certain age. Women in their 40s and 50s and men in their 70s are more   likely to develop this condition. SIGNS AND SYMPTOMS  Symptoms may include:   Varicose veins.  Skin breakdown or ulcers.  Reddened or discolored skin on the leg.  Brown, smooth, tight, and painful skin just above the ankle, usually on the inside surface (lipodermatosclerosis).  Swelling. DIAGNOSIS  To diagnose this condition, your health care provider will take a medical history and do a physical exam. The following tests may be ordered to confirm the diagnosis:  Duplex ultrasound--A procedure that produces a picture of a blood vessel and nearby organs and also provides information on blood flow through the blood vessel.  Plethysmography--A procedure that tests  blood flow.  A venogram, or venography--A procedure used to look at the veins using X-ray and dye. TREATMENT The goals of treatment are to help you return to an active life and to minimize pain or disability. Treatment will depend on the severity of the condition. Medical procedures may be needed for severe cases. Treatment options may include:   Use of compression stockings. These can help with symptoms and lower the chances of the problem getting worse, but they do not cure the problem.  Sclerotherapy--A procedure involving an injection of a material that "dissolves" the damaged veins. Other veins in the network of blood vessels take over the function of the damaged veins.  Surgery to remove the vein or cut off blood flow through the vein (vein stripping or laser ablation surgery).  Surgery to repair a valve. HOME CARE INSTRUCTIONS   Wear compression stockings as directed by your health care provider.  Only take over-the-counter or prescription medicines for pain, discomfort, or fever as directed by your health care provider.  Follow up with your health care provider as directed. SEEK MEDICAL CARE IF:   You have redness, swelling, or increasing pain in the affected area.  You see a red streak or line that extends up or down from the affected area.  You have a breakdown or loss of skin in the affected area, even if the breakdown is small.  You have an injury to the affected area. SEEK IMMEDIATE MEDICAL CARE IF:   You have an injury and open wound in the affected area.  Your pain is severe and does not improve with medicine.  You have sudden numbness or weakness in the foot or ankle below the affected area, or you have trouble moving your foot or ankle.  You have a fever or persistent symptoms for more than 2-3 days.  You have a fever and your symptoms suddenly get worse. MAKE SURE YOU:   Understand these instructions.  Will watch your condition.  Will get help right  away if you are not doing well or get worse. Document Released: 04/23/2006 Document Revised: 10/08/2012 Document Reviewed: 08/25/2012 [employer] Patient Information 2015 [employer]. This information is not intended to replace advice given to you by your health care provider. Make sure you discuss any questions you have with your health care provider.  

## 2014-02-08 NOTE — Addendum Note (Signed)
Addended by: Mena Goes on: 02/08/2014 04:22 PM   Modules accepted: Orders

## 2014-02-17 ENCOUNTER — Encounter: Payer: Self-pay | Admitting: Vascular Surgery

## 2014-02-18 ENCOUNTER — Encounter: Payer: Self-pay | Admitting: Vascular Surgery

## 2014-02-18 ENCOUNTER — Ambulatory Visit (INDEPENDENT_AMBULATORY_CARE_PROVIDER_SITE_OTHER): Payer: Medicare Other | Admitting: Vascular Surgery

## 2014-02-18 VITALS — BP 152/63 | HR 64 | Ht 65.0 in | Wt 200.0 lb

## 2014-02-18 DIAGNOSIS — I739 Peripheral vascular disease, unspecified: Secondary | ICD-10-CM

## 2014-02-18 DIAGNOSIS — L03116 Cellulitis of left lower limb: Secondary | ICD-10-CM

## 2014-02-18 NOTE — Progress Notes (Signed)
[facility] [facility] HISTORY AND PHYSICAL   History of Present Illness:  Patient is a 64 y.o. year old male who presents for evaluation of cellulitis left lower extremity. The patient was seen by our nurse practitioner a few weeks ago with a cellulitis in his left leg. He has known underlying peripheral arterial disease. 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 He states the leg feels better. He has no further drainage from the left foot. He states the swelling is still more than on the right leg but improved overall. He has been compliant wearing his compression stockings. He is not smoking.   Review of systems: He denies fever or chills. He denies shortness of breath. He denies chest pain.  Allergies  Allergies  Allergen Reactions  . Codeine Nausea And Vomiting  . Lisinopril     cough  . Ramipril     cough  . Tape     Blisters     Current Outpatient Prescriptions  Medication Sig Dispense Refill  . amLODipine (NORVASC) 5 MG tablet Take 1 tablet (5 mg total) by mouth daily. 30 tablet 6  . aspirin 81 MG tablet Take 81 mg by mouth daily.      [name] atorvastatin (LIPITOR) 20 MG tablet Take 10 mg by mouth daily.     . Canagliflozin (INVOKANA) 100 MG TABS Take by mouth daily.    . cephALEXin (KEFLEX) 500 MG capsule Take 1 capsule (500 mg total) by mouth 3 (three) times daily. 30 capsule 0  . cetirizine (ZYRTEC) 10 MG tablet Take 10 mg by mouth daily.    . CHANTIX STARTING MONTH PAK 0.5 MG X 11 & 1 MG X 42 tablet     . chlorpheniramine (CHLOR-TABLETS) 4 MG tablet Take 4 mg by mouth daily.      . clopidogrel (PLAVIX) 75 MG tablet TAKE 1 TABLET BY MOUTH EVERY DAY 30 tablet 1  . Docusate Calcium (STOOL SOFTENER PO) Take 100 mg by mouth daily.     [name] doxazosin (CARDURA) 2 MG tablet Take 2 mg by mouth daily.      [name] doxycycline (VIBRAMYCIN) 100 MG capsule     . erythromycin ophthalmic ointment Place 1 application into both eyes 2 (two) times daily.     [name] gabapentin (NEURONTIN) 300 MG capsule Take 300 mg by mouth 2 (two) times daily.     [name] HUMALOG KWIKPEN 100 UNIT/ML SOPN 16 Units 3 (three) times daily with meals.     [name] ipratropium (ATROVENT) 0.06 % nasal spray Place 2 sprays into both nostrils 4 (four) times daily as needed for rhinitis.    [name] LEVEMIR FLEXPEN 100 UNIT/ML SOPN Inject 30 Units into the skin daily.     . metoprolol (LOPRESSOR) 50 MG tablet TAKE 1 TABLET (50 MG TOTAL) BY MOUTH 2 (TWO) TIMES DAILY. 60 tablet 3  . mometasone (NASONEX) 50 MCG/ACT nasal spray Place 2 sprays into the nose daily as needed (for nasal congestion).     . nitroGLYCERIN (NITROSTAT) 0.4 MG SL tablet Place 1 tablet (0.4 mg total) under the tongue every 5 (five) minutes as needed. 100 tablet 3  . NOVOFINE 32G X 6 MM MISC     . omeprazole (PRILOSEC) 20 MG capsule Take 40 mg by mouth 2 (two) times daily.    [name] omeprazole (PRILOSEC) 40 MG capsule Take 40 mg by mouth daily.    . ONE TOUCH ULTRA TEST test strip 1 each daily.     [name]  ONETOUCH DELICA LANCETS MISC daily.    [name] oxybutynin (DITROPAN XL) 15 MG 24 hr tablet Take 15 mg by mouth daily.      [name] oxyCODONE-acetaminophen (PERCOCET/ROXICET) 5-325 MG per tablet Take 1-2 tablets by mouth every 4 (four) hours as needed for moderate pain or severe pain. 12 tablet 0  . traMADol (ULTRAM) 50 MG tablet Take 50 mg by mouth every 6 (six) hours as needed for moderate pain.     [name] triamterene-hydrochlorothiazide (MAXZIDE-25) 37.5-25 MG per tablet Take 1 tablet by mouth daily.     [name] ZETIA 10 MG tablet TAKE 1 TABLET (10 MG TOTAL) BY MOUTH DAILY. 30 tablet 1   No current facility-administered medications for this visit.     Physical Examination  Filed Vitals:   02/18/14 1025  BP: 152/63  Pulse: 64  Height: 5\' 5"  (1.651 m)  Weight: 200 lb (90.719 kg)  SpO2: 96%    Body mass index is 33.28 kg/(m^2).  General:  Alert and oriented, no acute distress Cardiac: Regular Rate and Rhythm without murmur Skin: No rash, healed  ulcer dorsum left foot and left pretibial region edema bilateral lower extremities left greater than right Extremity Pulses:  2+ radial, brachial, femoral, absent dorsalis pedis, posterior tibial pulses bilaterally  DATA:  ABIs from 02/05/2014 are reviewed today. Shows ABI 0.6 on the left 0.6 on the right this is unchanged dating back as far as 2009.   ASSESSMENT:  Cellulitis left lower extremity resolved. Known underlying peripheral arterial disease stable   PLAN:  Follow up 6 months ABIs. Continue to wear compression stockings as much as possible.  Ruta Hinds, MD [facility] Office: [phone] Pager: [phone]

## 2014-02-18 NOTE — Addendum Note (Signed)
Addended by: Mena Goes on: 02/18/2014 05:26 PM   Modules accepted: Orders

## 2014-04-03 ENCOUNTER — Other Ambulatory Visit: Payer: Self-pay | Admitting: Cardiology

## 2014-04-14 ENCOUNTER — Ambulatory Visit (INDEPENDENT_AMBULATORY_CARE_PROVIDER_SITE_OTHER): Payer: Medicare Other | Admitting: Nurse Practitioner

## 2014-04-14 ENCOUNTER — Encounter: Payer: Self-pay | Admitting: Nurse Practitioner

## 2014-04-14 VITALS — BP 124/60 | HR 56 | Ht 65.0 in | Wt 214.8 lb

## 2014-04-14 DIAGNOSIS — I10 Essential (primary) hypertension: Secondary | ICD-10-CM

## 2014-04-14 DIAGNOSIS — R0600 Dyspnea, unspecified: Secondary | ICD-10-CM

## 2014-04-14 DIAGNOSIS — R072 Precordial pain: Secondary | ICD-10-CM

## 2014-04-14 DIAGNOSIS — I25709 Atherosclerosis of coronary artery bypass graft(s), unspecified, with unspecified angina pectoris: Secondary | ICD-10-CM

## 2014-04-14 DIAGNOSIS — E785 Hyperlipidemia, unspecified: Secondary | ICD-10-CM

## 2014-04-14 DIAGNOSIS — R0789 Other chest pain: Secondary | ICD-10-CM

## 2014-04-14 MED ORDER — ISOSORBIDE MONONITRATE ER 30 MG PO TB24: 30.0000 mg | ORAL_TABLET | Freq: Every day | ORAL | Status: DC

## 2014-04-14 NOTE — Patient Instructions (Signed)
Medication Instructions:  Start Imdur ( 30 mg ) daily, sent into your pharmacy today  Labwork: None  Testing/Procedures:. Your physician has requested that you have a lexiscan myoview. For further information please visit [email or web address]. Please follow instruction sheet, as given.  Your physician has requested that you have an echocardiogram. Echocardiography is a painless test that uses sound waves to create images of your heart. It provides your doctor with information about the size and shape of your heart and how well your heart's chambers and valves are working. This procedure takes approximately one hour. There are no restrictions for this procedure.    Follow-Up: With Dr. [name]   Any Other Special Instructions Will Be Listed Below (If Applicable).

## 2014-04-14 NOTE — Progress Notes (Signed)
Patient Name: Hunter [name] Date of Encounter: 04/14/2014  Primary Care Provider:  [name], MD Primary Cardiologist:  Vaughan Browner, MD   Chief Complaint  64 year old male with history of CAD, COPD, chronic dyspnea, and spinal cord injury with limited mobility who presents for follow-up related to dyspnea and chest pain.  Past Medical History   Past Medical History  Diagnosis Date  . CAD (coronary artery disease)     a. Remote MI with stent in 2005;  b. CABG x 5 in 2005;  c. 09/2010 Cath: LM nl, LAD 70p, 90-21m, D1 80p, LCX 46m, OM1 100, OM2 90ost, OM3 80, RCA 47m, VG->PDA->RPL nl, VG->Diag nl, VG->OM1 nl, LIMA->LAD nl, EF 60%-->Med Rx.  [name] COPD (chronic obstructive pulmonary disease)   . Diabetes mellitus   . PAD (peripheral artery disease)     followed by Dr. [name]  . Hypertension   . Tobacco dependence in remission     a. quit 2013.  . Increased liver enzymes     due to alcohol use  . Arthritis   . Occlusive disease, arterial     lower extremity  . H/O spinal cord injury     a. more or less wheelchair bound.  . Nocturia   . Cellulitis of left lower extremity    Past Surgical History  Procedure Laterality Date  . Coronary artery bypass graft  2005    LIMA to LAD, SVG to RCA & PD, SVG to OM 1, SVG to 1st DX; Dr. [name]  . Epispadius correction    . Femur surgery      RIGHT  . Back surgery      FUSIONS  . Spinal tumor    . Laminectomy    . Hand surgery      Left   . Eye surgery      BILATERAL TEAR DUCT  . Eye surgery Right Sept. 2014    Cataract    Allergies  Allergies  Allergen Reactions  . Codeine Nausea And Vomiting  . Lisinopril     cough  . Ramipril     cough  . Tape     Blisters    HPI  64 year old male with the above complex problem list. He has a prior history of CAD status post coronary artery bypass grafting in 2005. He underwent catheterization in 2012 revealing 5 of 5 patent grafts. He has limited mobility at home  related to spinal cord injury and more or less uses his wheelchair for everything. In that setting, he has chronic lower extremity edema. Over the past 4-6 weeks, he's noted some increase in dyspnea with activities outside of his wheelchair and has also been experiencing intermittent sharp and shooting left-sided chest pain several days per week, occurring generally at rest, lasting a minute or less, and resolving spontaneously. He has not had to take nitroglycerin for any of these episodes. He saw his primary care provider about a month ago and this appointment was arranged for today. He denies PND, orthopnea, dizziness, syncope. He notes weight gain related to having quit smoking and using insulin but does not think his volume status has changed.  Home Medications  Prior to Admission medications   Medication Sig Start Date End Date Taking? Authorizing Provider  amLODipine (NORVASC) 5 MG tablet Take 1 tablet (5 mg total) by mouth daily. 01/04/12  Yes [name], MD  aspirin 81 MG tablet Take 81 mg by mouth daily.     Yes Historical Provider, MD  atorvastatin (LIPITOR) 20 MG tablet Take 10 mg by mouth daily.    Yes Historical Provider, MD  Canagliflozin (INVOKANA) 100 MG TABS Take by mouth daily.   Yes Historical Provider, MD  cephALEXin (KEFLEX) 500 MG capsule Take 1 capsule (500 mg total) by mouth 3 (three) times daily. 02/05/14  Yes Suzanne L Nickel, NP  cetirizine (ZYRTEC) 10 MG tablet Take 10 mg by mouth daily.   Yes Historical Provider, MD  CHANTIX STARTING MONTH PAK 0.5 MG X 11 & 1 MG X 42 tablet  10/22/13  Yes Historical Provider, MD  chlorpheniramine (CHLOR-TABLETS) 4 MG tablet Take 4 mg by mouth daily.     Yes Historical Provider, MD  clopidogrel (PLAVIX) 75 MG tablet TAKE 1 TABLET BY MOUTH EVERY DAY 04/05/14  Yes [name], MD  Docusate Calcium (STOOL SOFTENER PO) Take 100 mg by mouth daily.    Yes Historical Provider, MD  doxazosin (CARDURA) 2 MG tablet Take 2 mg by mouth daily.      Yes Historical Provider, MD  doxycycline (VIBRAMYCIN) 100 MG capsule  10/21/13  Yes Historical Provider, MD  erythromycin ophthalmic ointment Place 1 application into both eyes 2 (two) times daily.   Yes Historical Provider, MD  gabapentin (NEURONTIN) 300 MG capsule Take 300 mg by mouth 2 (two) times daily.    Yes Historical Provider, MD  HUMALOG KWIKPEN 100 UNIT/ML SOPN 16 Units 3 (three) times daily with meals.  09/13/12  Yes Historical Provider, MD  ipratropium (ATROVENT) 0.06 % nasal spray Place 2 sprays into both nostrils 4 (four) times daily as needed for rhinitis.   Yes Historical Provider, MD  LEVEMIR FLEXPEN 100 UNIT/ML SOPN Inject 30 Units into the skin daily.  09/13/12  Yes Historical Provider, MD  metoprolol (LOPRESSOR) 50 MG tablet TAKE 1 TABLET (50 MG TOTAL) BY MOUTH 2 (TWO) TIMES DAILY. 01/04/14  Yes [name], MD  mometasone (NASONEX) 50 MCG/ACT nasal spray Place 2 sprays into the nose daily as needed (for nasal congestion).    Yes Historical Provider, MD  nitroGLYCERIN (NITROSTAT) 0.4 MG SL tablet Place 1 tablet (0.4 mg total) under the tongue every 5 (five) minutes as needed. 03/31/10  Yes [name], MD  NOVOFINE 32G X 6 MM MISC  12/04/12  Yes Historical Provider, MD  omeprazole (PRILOSEC) 40 MG capsule Take 40 mg by mouth 2 (two) times daily.   Yes Historical Provider, MD  ONE TOUCH ULTRA TEST test strip 1 each daily.  11/04/11  Yes Historical Provider, MD  Jonetta Speak LANCETS MISC daily. 11/04/11  Yes Historical Provider, MD  oxybutynin (DITROPAN XL) 15 MG 24 hr tablet Take 15 mg by mouth daily.     Yes Historical Provider, MD  oxyCODONE-acetaminophen (PERCOCET/ROXICET) 5-325 MG per tablet Take 1-2 tablets by mouth every 4 (four) hours as needed for moderate pain or severe pain. 11/03/13  Yes Amparo Bristol, MD  traMADol (ULTRAM) 50 MG tablet Take 50 mg by mouth every 6 (six) hours as needed for moderate pain.  03/03/12  Yes [name], MD    triamterene-hydrochlorothiazide (MAXZIDE-25) 37.5-25 MG per tablet Take 1 tablet by mouth daily.  10/03/12  Yes Historical Provider, MD  ZETIA 10 MG tablet TAKE 1 TABLET (10 MG TOTAL) BY MOUTH DAILY. 08/05/12  Yes [name], MD  isosorbide mononitrate (IMDUR) 30 MG 24 hr tablet Take 1 tablet (30 mg total) by mouth daily. 04/14/14   Rogelia Mire, NP    Review of Systems  As above,  he has been experiencing dyspnea along with intermittent sharp and shooting chest pain over the past 4-6 weeks. He has chronic lower extremity edema which she says is unchanged. He has limited usage of his lower extremities, left worse than right.  All other systems reviewed and are otherwise negative except as noted above.  Physical Exam  VS:  BP 124/60 mmHg  Pulse 56  Ht 5\' 5"  (1.651 m)  Wt 214 lb 12.8 oz (97.433 kg)  BMI 35.74 kg/m2 , BMI Body mass index is 35.74 kg/(m^2). GEN: Well nourished, well developed, in no acute distress. HEENT: normal. Neck: Supple, obese, difficult to assess jvp.  No carotid bruits, or masses. Cardiac: RRR, no murmurs, rubs, or gallops. No clubbing, cyanosis.  1-2+ bilat LE edema to knees.  Radials/DP/PT 2+ and equal bilaterally.  Respiratory:  Respirations regular and unlabored, clear to auscultation bilaterally. GI: Soft, nontender, nondistended, BS + x 4. MS: no deformity or atrophy. Skin: warm and dry, no rash. Neuro:  Strength and sensation are intact. Psych: Normal affect.  Accessory Clinical Findings  ECG - sinus bradycardia, 56, inferior infarct, inferolateral T-wave inversion, poor R-wave progression.   Assessment & Plan  1.  Left-sided chest pain/coronary artery disease/dyspnea on exertion: Patient presents with 4-6 week history of intermittent sharp and shooting left-sided chest pain occurring predominantly at rest. He is also been experiencing some increase in dyspnea above his baseline. Last catheterization was in 2012 at which time grafts were patent.  I have arranged for a Lexiscan Cardiolite to rule out ischemia. In the setting of dyspnea, I will also arrange for 2-D echocardiogram to reevaluate LV function which was previously normal. I have added Imdur 30 mg daily and will otherwise continue aspirin, statin, Plavix, and beta blocker therapy. His weight does appear to be up over the past several months though he reports that his volume status is stable. His lungs are clear. He does have chronic lower extremity edema. He takes triamterene hydrochlorothiazide at home. I recommended he keep his legs elevated as much as possible during the day as he spends much of his day in a wheelchair.  2. Hypertension: Stable on current regimen.  3. Hyperlipidemia: Continue statin therapy. Next  4. Chronic lower extremity edema: This appears to be stable. He is encouraged to keep his legs elevated and continue to wear compression hose at home.   5. Type 2 diabetes mellitus: Managed by primary care.  6. Disposition: Follow-up with Dr. [name] in approximately 2-3 weeks following testing.   Murray Hodgkins, NP 04/14/2014, 11:11 AM

## 2014-04-27 ENCOUNTER — Ambulatory Visit (HOSPITAL_COMMUNITY): Payer: Medicare Other | Attending: Cardiology | Admitting: Radiology

## 2014-04-27 ENCOUNTER — Ambulatory Visit (HOSPITAL_BASED_OUTPATIENT_CLINIC_OR_DEPARTMENT_OTHER): Payer: Medicare Other

## 2014-04-27 ENCOUNTER — Other Ambulatory Visit: Payer: Self-pay

## 2014-04-27 DIAGNOSIS — R0600 Dyspnea, unspecified: Secondary | ICD-10-CM

## 2014-04-27 DIAGNOSIS — Z87891 Personal history of nicotine dependence: Secondary | ICD-10-CM | POA: Diagnosis not present

## 2014-04-27 DIAGNOSIS — R0602 Shortness of breath: Secondary | ICD-10-CM | POA: Insufficient documentation

## 2014-04-27 DIAGNOSIS — R072 Precordial pain: Secondary | ICD-10-CM | POA: Diagnosis not present

## 2014-04-27 DIAGNOSIS — I10 Essential (primary) hypertension: Secondary | ICD-10-CM | POA: Diagnosis not present

## 2014-04-27 DIAGNOSIS — R0789 Other chest pain: Secondary | ICD-10-CM

## 2014-04-27 DIAGNOSIS — E785 Hyperlipidemia, unspecified: Secondary | ICD-10-CM | POA: Insufficient documentation

## 2014-04-27 DIAGNOSIS — E119 Type 2 diabetes mellitus without complications: Secondary | ICD-10-CM | POA: Insufficient documentation

## 2014-04-27 DIAGNOSIS — R079 Chest pain, unspecified: Secondary | ICD-10-CM | POA: Insufficient documentation

## 2014-04-27 DIAGNOSIS — Z794 Long term (current) use of insulin: Secondary | ICD-10-CM | POA: Diagnosis not present

## 2014-04-27 HISTORY — PX: TRANSTHORACIC ECHOCARDIOGRAM: SHX275

## 2014-04-27 MED ORDER — REGADENOSON 0.4 MG/5ML IV SOLN
0.4000 mg | Freq: Once | INTRAVENOUS | Status: AC
  Administered 2014-04-27: 0.4 mg via INTRAVENOUS

## 2014-04-27 MED ORDER — TECHNETIUM TC 99M SESTAMIBI GENERIC - CARDIOLITE
33.0000 | Freq: Once | INTRAVENOUS | Status: AC | PRN
  Administered 2014-04-27: 33 via INTRAVENOUS

## 2014-04-27 MED ORDER — TECHNETIUM TC 99M SESTAMIBI GENERIC - CARDIOLITE
11.0000 | Freq: Once | INTRAVENOUS | Status: AC | PRN
  Administered 2014-04-27: 11 via INTRAVENOUS

## 2014-04-27 NOTE — Progress Notes (Signed)
2D Echo completed. 04/27/2014

## 2014-04-27 NOTE — Progress Notes (Signed)
[name] [address] [place], [place] [address] [phone]    Cardiology Nuclear Med Study  [name] is a 64 y.o. male     MRN : [id number]     DOB: 1950-06-15  Procedure Date: 04/27/2014  Nuclear Med Background Indication for Stress Test:  Evaluation for Ischemia History:  CAD, MPI 2012 (normal) EF 53%, COPD Cardiac Risk Factors: Hypertension, Lipids and IDDM  Symptoms:  Chest Pain and SOB   Nuclear Pre-Procedure Caffeine/Decaff Intake:  8:00pm NPO After: 6:00am   Lungs:  clear O2 Sat: 97% on room air. IV 0.9% NS with Angio Cath:  22g  IV Site: R Hand  IV Started by:  Matilde Haymaker, RN  Chest Size (in):  46 Cup Size: n/a  Height: 5\' 5"  (1.651 m)  Weight:  212 lb (96.163 kg)  BMI:  Body mass index is 35.28 kg/(m^2). Tech Comments:  No Lopressor x 24 hrs    Nuclear Med Study 1 or 2 day study: 1 day  Stress Test Type:  Lexiscan  Reading MD: n/a  Order Authorizing Provider:  Henrene Dodge  Resting Radionuclide: Technetium 53m Sestamibi  Resting Radionuclide Dose: 11.0 mCi   Stress Radionuclide:  Technetium 57m Sestamibi  Stress Radionuclide Dose: 33.0 mCi           Stress Protocol Rest HR: 61 Stress HR: 87  Rest BP: 146/70 Stress BP: 117/51  Exercise Time (min): n/a METS: n/a   Predicted Max HR: 157 bpm % Max HR: 55.41 bpm Rate Pressure Product: 12963   Dose of Adenosine (mg):  n/a Dose of Lexiscan: 0.4 mg  Dose of Atropine (mg): n/a Dose of Dobutamine: n/a mcg/kg/min (at max HR)  Stress Test Technologist: Glade Lloyd, BS-ES  Nuclear Technologist:  Earl Many, CNMT     Rest Procedure:  Myocardial perfusion imaging was performed at rest 45 minutes following the intravenous administration of Technetium 46m Sestamibi. Rest ECG: NSR with non-specific ST-T wave changes  Stress Procedure:  The patient received IV Lexiscan 0.4 mg over 15-seconds.  Technetium 87m Sestamibi injected at 30-seconds.  Quantitative  spect images were obtained after a 45 minute delay.  During the infusion of Lexiscan the patient complained of SOB, throat & chest tightness, headache and an upper middle chest pain.  These symptoms slowly began to resolve in recovery.  Stress ECG: No significant change from baseline ECG  QPS Raw Data Images:  Significant extracardiac uptake.  Stress Images:  Normal homogeneous uptake in all areas of the myocardium. Rest Images:  Normal homogeneous uptake in all areas of the myocardium. Subtraction (SDS):  No evidence of ischemia. Transient Ischemic Dilatation (Normal <1.22): 1.05 Lung/Heart Ratio (Normal <0.45):  0.34  Quantitative Gated Spect Images QGS EDV:  98 ml QGS ESV:  49 ml  Impression Exercise Capacity:  Lexiscan with no exercise. BP Response:  Normal blood pressure response. Clinical Symptoms:  Mild chest pain/dyspnea. ECG Impression:  No significant ST segment change suggestive of ischemia. Comparison with Prior Nuclear Study: No significant change from previous study  Overall Impression:  Normal stress nuclear study.  LV Ejection Fraction: 50%.  LV Wall Motion:  NL LV Function; NL Wall Motion   [name] 04/27/2014

## 2014-05-01 ENCOUNTER — Other Ambulatory Visit: Payer: Self-pay | Admitting: Cardiology

## 2014-05-05 ENCOUNTER — Ambulatory Visit (INDEPENDENT_AMBULATORY_CARE_PROVIDER_SITE_OTHER): Payer: Medicare Other | Admitting: Cardiology

## 2014-05-05 ENCOUNTER — Encounter: Payer: Self-pay | Admitting: Cardiology

## 2014-05-05 VITALS — BP 140/68 | HR 59 | Ht 65.0 in | Wt 217.0 lb

## 2014-05-05 DIAGNOSIS — R0600 Dyspnea, unspecified: Secondary | ICD-10-CM | POA: Diagnosis not present

## 2014-05-05 DIAGNOSIS — I25709 Atherosclerosis of coronary artery bypass graft(s), unspecified, with unspecified angina pectoris: Secondary | ICD-10-CM | POA: Diagnosis not present

## 2014-05-05 DIAGNOSIS — I119 Hypertensive heart disease without heart failure: Secondary | ICD-10-CM | POA: Diagnosis not present

## 2014-05-05 DIAGNOSIS — I872 Venous insufficiency (chronic) (peripheral): Secondary | ICD-10-CM

## 2014-05-05 NOTE — Progress Notes (Signed)
Cardiology Office Note   Date:  05/05/2014   ID:  [name], DOB 1950-10-04, MRN [id number]  PCP:  Hunter Lopes, MD  Cardiologist: [name] MD  No chief complaint on file.     History of Present Illness: Hunter [name] is a 64 y.o. male who presents for a six-month follow-up office visit. This pleasant 64 year old gentleman is seen for a followup office visit. Hunter [name] He has a history of known ischemic heart disease. He had a myocardial infarction with stent in 2005. The patient underwent coronary artery bypass graft surgery also in 2005. He had increasing chest pain in September 2012 and underwent cardiac catheterization by Dr. [name] on 09/12/10 showed normal left ventricular systolic function and showed that all of his grafts were patent. Patient does have occasional chest discomfort. He has not had to take any recent sublingual nitroglycerin. He quit smoking 3 years ago and this has helped his breathing and also his chest pain. His appetite is good and he has been gaining weight. He is in a wheelchair. He does not get any regular exercise. He was recently seen as a work in because of increasing chest tightness and shortness of breath.  He had an echocardiogram on 04/27/14 which showed an ejection fraction of 50-55% and there was grade 2 diastolic dysfunction.  The patient also had a Lexiscan Myoview stress test on 426/16 which showed no evidence of ischemia and his ejection fraction was 50%.  The patient has gained weight.  The patient reports that he has not had to take sublingual nitroglycerin often.  He takes it occasionally and when he does take it seems to help.  At his last work in visit he was given isosorbide mononitrate but was unable to take it long term because of severe headache.  This had happened once before as well.   Past Medical History  Diagnosis Date  . CAD (coronary artery disease)     a. Remote MI with stent in 2005;  b. CABG x 5 in 2005;  c.  09/2010 Cath: LM nl, LAD 70p, 90-50m, D1 80p, LCX 18m, OM1 100, OM2 90ost, OM3 80, RCA 70m, VG->PDA->RPL nl, VG->Diag nl, VG->OM1 nl, LIMA->LAD nl, EF 60%-->Med Rx.  Hunter [name] COPD (chronic obstructive pulmonary disease)   . Diabetes mellitus   . PAD (peripheral artery disease)     followed by Dr. Oneida [name]  . Hypertension   . Tobacco dependence in remission     a. quit 2013.  . Increased liver enzymes     due to alcohol use  . Arthritis   . Occlusive disease, arterial     lower extremity  . H/O spinal cord injury     a. more or less wheelchair bound.  . Nocturia   . Cellulitis of left lower extremity     Past Surgical History  Procedure Laterality Date  . Coronary artery bypass graft  2005    LIMA to LAD, SVG to RCA & PD, SVG to OM 1, SVG to 1st DX; Dr. Roxan [name]  . Epispadius correction    . Femur surgery      RIGHT  . Back surgery      FUSIONS  . Spinal tumor    . Laminectomy    . Hand surgery      Left   . Eye surgery      BILATERAL TEAR DUCT  . Eye surgery Right Sept. 2014    Cataract     Current Outpatient Prescriptions  Medication Sig Dispense Refill  . amLODipine (NORVASC) 5 MG tablet Take 1 tablet (5 mg total) by mouth daily. 30 tablet 6  . aspirin 81 MG tablet Take 81 mg by mouth daily.      Hunter [name] atorvastatin (LIPITOR) 10 MG tablet Take 5 tablets by mouth 2 (two) times daily.     . Canagliflozin (INVOKANA) 100 MG TABS Take 100 mg by mouth daily.     . cetirizine (ZYRTEC) 10 MG tablet Take 10 mg by mouth daily.    . chlorpheniramine (CHLOR-TABLETS) 4 MG tablet Take 4 mg by mouth daily.      . clopidogrel (PLAVIX) 75 MG tablet TAKE 1 TABLET BY MOUTH EVERY DAY 30 tablet 1  . Docusate Calcium (STOOL SOFTENER PO) Take 100 mg by mouth daily.     Hunter [name] doxazosin (CARDURA) 2 MG tablet Take 2 mg by mouth daily.      Hunter [name] gabapentin (NEURONTIN) 300 MG capsule Take 300 mg by mouth 2 (two) times daily.     Hunter [name] HUMALOG KWIKPEN 100 UNIT/ML SOPN Inject 16 Units into the skin 3 (three) times  daily with meals.     Hunter [name] ipratropium (ATROVENT) 0.06 % nasal spray Place 2 sprays into both nostrils 4 (four) times daily as needed for rhinitis.    [facility]unter [name] isosorbide mononitrate (IMDUR) 30 MG 24 hr tablet Take 1 tablet (30 mg total) by mouth daily. 30 tablet 6  . LEVEMIR FLEXPEN 100 UNIT/ML SOPN Inject 30 Units into the skin daily.     . metoprolol (LOPRESSOR) 50 MG tablet TAKE 1 TABLET (50 MG TOTAL) BY MOUTH 2 (TWO) TIMES DAILY. 60 tablet 5  . mometasone (NASONEX) 50 MCG/ACT nasal spray Place 2 sprays into the nose daily as needed (for nasal congestion).     . nitroGLYCERIN (NITROSTAT) 0.4 MG SL tablet Place 0.4 mg under the tongue every 5 (five) minutes as needed for chest pain (chest pain).    . NOVOFINE 32G X 6 MM MISC     . omeprazole (PRILOSEC) 40 MG capsule Take 40 mg by mouth 2 (two) times daily.    . ONE TOUCH ULTRA TEST test strip 1 each daily.     [facility]unter [name] DELICA LANCETS MISC daily.    [facility]unter [name] oxybutynin (DITROPAN XL) 15 MG 24 hr tablet Take 15 mg by mouth daily.      [facility]unter [name] oxyCODONE-acetaminophen (PERCOCET/ROXICET) 5-325 MG per tablet Take 1-2 tablets by mouth every 4 (four) hours as needed for moderate pain or severe pain. 12 tablet 0  . traMADol (ULTRAM) 50 MG tablet Take 50 mg by mouth every 6 (six) hours as needed for moderate pain.     [facility]unter [name] triamterene-hydrochlorothiazide (MAXZIDE-25) 37.5-25 MG per tablet Take 1 tablet by mouth daily.     [name] ZETIA 10 MG tablet TAKE 1 TABLET (10 MG TOTAL) BY MOUTH DAILY. 30 tablet 1   No current facility-administered medications for this visit.    Allergies:   Codeine; Lisinopril; Ramipril; and Tape    Social History:  The patient  reports that he quit smoking about 5 years ago. His smoking use included Cigarettes. He has a 20 pack-year smoking history. He has never used smokeless tobacco. He reports that he drinks alcohol. He reports that he does not use illicit drugs.   Family History:  The patient's family history includes COPD in his sister; Cancer in  his mother and sister; Coronary artery disease in his brother and sister; Diabetes in his brother, mother, and sister; Heart  attack in his brother, mother, and sister; Heart disease in his brother, father, mother, and sister; Other in his brother and father.    ROS:  Please see the history of present illness.   Otherwise, review of systems are positive for none.   All other systems are reviewed and negative.    PHYSICAL EXAM: VS:  BP 140/68 mmHg  Pulse 59  Ht 5\' 5"  (1.651 m)  Wt 217 lb (98.431 kg)  BMI 36.11 kg/m2 , BMI Body mass index is 36.11 kg/(m^2). GEN: Well nourished, well developed, in no acute distress HEENT: normal Neck: no JVD, carotid bruits, or masses Cardiac: RRR; no murmurs, rubs, or gallops,no edema  Respiratory:  clear to auscultation bilaterally, normal work of breathing GI: soft, nontender, nondistended, + BS MS: no deformity or atrophy Skin: warm and dry, no rash Neuro:  Strength and sensation are intact Psych: euthymic mood, full affect   EKG:  EKG is not ordered today.    Recent Labs: 11/03/2013: ALT 25; BUN 22; Creatinine 0.99; Hemoglobin 15.4; Platelets 173; Potassium 5.4*; Sodium 141    Lipid Panel    Component Value Date/Time   CHOL 105 03/31/2010 0910   TRIG 97.0 03/31/2010 0910   HDL 26.00* 03/31/2010 0910   CHOLHDL 4 03/31/2010 0910   VLDL 19.4 03/31/2010 0910   LDLCALC 60 03/31/2010 0910      Wt Readings from Last 3 Encounters:  05/05/14 217 lb (98.431 kg)  04/27/14 212 lb (96.163 kg)  04/14/14 214 lb 12.8 oz (97.433 kg)        ASSESSMENT AND PLAN:  1. Ischemic heart disease with prior myocardial infarction 2005. Subsequent CABG 2005. Most recent cardiac catheterization 09/12/10 showing all of his grafts were patent. 2. Benign hypertensive heart disease without heart failure 3. type 2 diabetes mellitus without mention of complication, uncontrolled 4. Obesity 5. possible sleep apnea 6.  Chronic venous insufficiency of legs  with edema    Plan: Continue on same medication. Try to lose weight. Recheck here in 6 months for followup office visit and EKG   Current medicines are reviewed at length with the patient today.  The patient does not have concerns regarding medicines.  The following changes have been made:  no change  Labs/ tests ordered today include:  No orders of the defined types were placed in this encounter.       Berna Spare MD 05/05/2014 1:13 PM    [facility] [place], [place], [place]  [address] Phone: [phone]; Fax: [phone]

## 2014-05-05 NOTE — Patient Instructions (Signed)
Medication Instructions:  Your physician recommends that you continue on your current medications as directed. Please refer to the Current Medication list given to you today.  Labwork: none  Testing/Procedures: none  Follow-Up: Your physician wants you to follow-up in: 6 month ov/ekg  You will receive a reminder letter in the mail two months in advance. If you don't receive a letter, please call our office to schedule the follow-up appointment.     

## 2014-05-30 ENCOUNTER — Other Ambulatory Visit: Payer: Self-pay | Admitting: Cardiology

## 2014-07-29 ENCOUNTER — Other Ambulatory Visit: Payer: Self-pay | Admitting: Physician Assistant

## 2014-08-12 ENCOUNTER — Ambulatory Visit: Payer: Medicare Other | Admitting: Family

## 2014-08-12 ENCOUNTER — Encounter (HOSPITAL_COMMUNITY): Payer: Medicare Other

## 2014-08-19 ENCOUNTER — Encounter (HOSPITAL_COMMUNITY): Payer: Medicare Other

## 2014-08-19 ENCOUNTER — Ambulatory Visit: Payer: Medicare Other | Admitting: Family

## 2014-08-31 ENCOUNTER — Other Ambulatory Visit: Payer: Self-pay | Admitting: Neurosurgery

## 2014-08-31 DIAGNOSIS — M545 Low back pain: Secondary | ICD-10-CM

## 2014-09-07 ENCOUNTER — Other Ambulatory Visit: Payer: Self-pay | Admitting: Neurosurgery

## 2014-09-07 ENCOUNTER — Ambulatory Visit
Admission: RE | Admit: 2014-09-07 | Discharge: 2014-09-07 | Disposition: A | Discharge status: Home / Self Care | Payer: Medicare Other | Source: Ambulatory Visit | Attending: Neurosurgery | Admitting: Neurosurgery

## 2014-09-07 DIAGNOSIS — M545 Low back pain: Secondary | ICD-10-CM

## 2014-09-08 ENCOUNTER — Other Ambulatory Visit: Payer: Self-pay | Admitting: Neurosurgery

## 2014-09-08 DIAGNOSIS — M545 Low back pain: Secondary | ICD-10-CM

## 2014-09-10 ENCOUNTER — Other Ambulatory Visit: Payer: Self-pay | Admitting: Neurosurgery

## 2014-09-10 ENCOUNTER — Ambulatory Visit
Admission: RE | Admit: 2014-09-10 | Discharge: 2014-09-10 | Disposition: A | Discharge status: Home / Self Care | Payer: Medicare Other | Source: Ambulatory Visit | Attending: Neurosurgery | Admitting: Neurosurgery

## 2014-09-10 DIAGNOSIS — M545 Low back pain: Secondary | ICD-10-CM

## 2014-09-10 MED ORDER — GADOBENATE DIMEGLUMINE 529 MG/ML IV SOLN
19.0000 mL | Freq: Once | INTRAVENOUS | Status: AC | PRN
  Administered 2014-09-10: 19 mL via INTRAVENOUS

## 2014-09-15 ENCOUNTER — Encounter: Payer: Self-pay | Admitting: Family

## 2014-09-17 ENCOUNTER — Encounter: Payer: Self-pay | Admitting: Family

## 2014-09-17 ENCOUNTER — Ambulatory Visit (INDEPENDENT_AMBULATORY_CARE_PROVIDER_SITE_OTHER): Payer: Medicare Other | Admitting: Family

## 2014-09-17 ENCOUNTER — Ambulatory Visit (HOSPITAL_COMMUNITY)
Admission: RE | Admit: 2014-09-17 | Discharge: 2014-09-17 | Disposition: A | Discharge status: Home / Self Care | Payer: Medicare Other | Source: Ambulatory Visit | Attending: Vascular Surgery | Admitting: Vascular Surgery

## 2014-09-17 VITALS — BP 155/67 | HR 63 | Temp 97.4°F | Resp 16 | Ht 66.0 in | Wt 206.0 lb

## 2014-09-17 DIAGNOSIS — Z87891 Personal history of nicotine dependence: Secondary | ICD-10-CM

## 2014-09-17 DIAGNOSIS — I739 Peripheral vascular disease, unspecified: Secondary | ICD-10-CM

## 2014-09-17 DIAGNOSIS — E1151 Type 2 diabetes mellitus with diabetic peripheral angiopathy without gangrene: Secondary | ICD-10-CM

## 2014-09-17 DIAGNOSIS — E1159 Type 2 diabetes mellitus with other circulatory complications: Secondary | ICD-10-CM

## 2014-09-17 DIAGNOSIS — I872 Venous insufficiency (chronic) (peripheral): Secondary | ICD-10-CM | POA: Diagnosis not present

## 2014-09-17 NOTE — Patient Instructions (Signed)

## 2014-09-17 NOTE — Progress Notes (Signed)
[facility] [place] HISTORY AND PHYSICAL -PAD  History of Present Illness Hunter [name] is a 64 y.o. male patient of Dr. 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  Pt last saw Dr. [name] on 02/18/14. At that time bilateral ABI's were 0.6, unchanged dating back as far as 2009. Known underlying peripheral arterial disease stable.  Cellulitis of left lower extremity had resolved.   He returns today for PAD surveillance.    He does not ambulate, therefore cannot report claudication, but he does use his legs to move his wheelchair, and the pain in his leg lower leg is no worse when using his legs.  He denies rest pain in right leg, but states left lower leg "feels like it's on fire almost all the time", this has gradually worsened in the last year from 3/10 to 5/10.   He also reports that his last A1C was 7.2%, improved from over 8.5.  Gabapentin helps some of his left leg pain.  He denies non-healing wounds.  He denies history of stroke or TIA symptoms.  He did have veins harvested from both legs for his 4 vessel CABG in 2005, had swelling in both LE's since then, left lower leg more so.  He does wear compression hose on legs.  He reports sciatic pain in left leg, "like a hot poker"; had 6 low back surgeries and 2 c-spine surgeries.  He had sleep studies recently, is using CPAP, and feels more rested, no more daytime fatigue.  Pt reports New Medical or Surgical History: right cataract extraction with IOL Sept., 2014. Had bilateral blepharoplasty. Had injections in shoulders for pain. He admits to not using his knee high compression hose unless he leaves his house.  Pt smoker: former smoker, quit in 2011 Pt meds include:  Statin :Yes  ASA: Yes  Other anticoagulants/antiplatelets: Plavix     Past Medical History  Diagnosis Date  . CAD (coronary artery  disease)     a. Remote MI with stent in 2005;  b. CABG x 5 in 2005;  c. 09/2010 Cath: LM nl, LAD 70p, 90-21m, D1 80p, LCX 90m, OM1 100, OM2 90ost, OM3 80, RCA 46m, VG->PDA->RPL nl, VG->Diag nl, VG->OM1 nl, LIMA->LAD nl, EF 60%-->Med Rx.  [name] COPD (chronic obstructive pulmonary disease)   . Diabetes mellitus   . PAD (peripheral artery disease)     followed by Dr. [name]  . Hypertension   . Tobacco dependence in remission     a. quit 2013.  . Increased liver enzymes     due to alcohol use  . Arthritis   . Occlusive disease, arterial     lower extremity  . H/O spinal cord injury     a. more or less wheelchair bound.  . Nocturia   . Cellulitis of left lower extremity     Social History Social History  Substance Use Topics  . Smoking status: Former Smoker -- 0.50 packs/day for 40 years    Types: Cigarettes    Quit date: 01/01/2009  . Smokeless tobacco: Never Used  . Alcohol Use: 0.0 oz/week    0 Standard drinks or equivalent per week     Comment: rare    Family History Family History  Problem Relation Age of Onset  . Other Father     WORK ACCIDENT  . Heart disease Father     Heart Disease before age 8  .  Heart attack Mother   . Heart disease Mother   . Cancer Mother   . Diabetes Mother   . Other Brother     SUICIDE  . Heart disease Brother   . Diabetes Brother   . Heart attack Brother   . Coronary artery disease Sister     CABG  . Heart disease Sister   . Cancer Sister     Lung  . Diabetes Sister   . Heart attack Sister   . Coronary artery disease Brother   . COPD Sister     Past Surgical History  Procedure Laterality Date  . Coronary artery bypass graft  2005    LIMA to LAD, SVG to RCA & PD, SVG to OM 1, SVG to 1st DX; Dr. [name]  . Epispadius correction    . Femur surgery      RIGHT  . Back surgery      FUSIONS  . Spinal tumor    . Laminectomy    . Hand surgery      Left   . Eye surgery      BILATERAL TEAR DUCT  . Eye surgery Right Sept. 2014     Cataract    Allergies  Allergen Reactions  . Codeine Nausea And Vomiting  . Lisinopril Other (See Comments)    cough  . Ramipril Other (See Comments)    cough  . Tape Other (See Comments)    Blisters    Current Outpatient Prescriptions  Medication Sig Dispense Refill  . amLODipine (NORVASC) 5 MG tablet Take 1 tablet (5 mg total) by mouth daily. 30 tablet 6  . aspirin 81 MG tablet Take 81 mg by mouth daily.      [name] atorvastatin (LIPITOR) 10 MG tablet Take 5 tablets by mouth 2 (two) times daily.     . Canagliflozin (INVOKANA) 100 MG TABS Take 100 mg by mouth daily.     . cetirizine (ZYRTEC) 10 MG tablet Take 10 mg by mouth daily.    . chlorpheniramine (CHLOR-TABLETS) 4 MG tablet Take 4 mg by mouth daily.      . clopidogrel (PLAVIX) 75 MG tablet TAKE 1 TABLET BY MOUTH DAILY 30 tablet 5  . Docusate Calcium (STOOL SOFTENER PO) Take 100 mg by mouth daily.     [name] doxazosin (CARDURA) 2 MG tablet Take 2 mg by mouth daily.      [name] gabapentin (NEURONTIN) 300 MG capsule Take 300 mg by mouth 2 (two) times daily.     [name] HUMALOG KWIKPEN 100 UNIT/ML SOPN Inject 16 Units into the skin 3 (three) times daily with meals.     [name] ipratropium (ATROVENT) 0.06 % nasal spray Place 2 sprays into both nostrils 4 (four) times daily as needed for rhinitis.    [name] isosorbide mononitrate (IMDUR) 30 MG 24 hr tablet Take 1 tablet (30 mg total) by mouth daily. 30 tablet 6  . LEVEMIR FLEXPEN 100 UNIT/ML SOPN Inject 30 Units into the skin daily.     . metoprolol (LOPRESSOR) 50 MG tablet TAKE 1 TABLET (50 MG TOTAL) BY MOUTH 2 (TWO) TIMES DAILY. 60 tablet 5  . mometasone (NASONEX) 50 MCG/ACT nasal spray Place 2 sprays into the nose daily as needed (for nasal congestion).     . nitroGLYCERIN (NITROSTAT) 0.4 MG SL tablet Place 0.4 mg under the tongue every 5 (five) minutes as needed for chest pain (chest pain).    . NOVOFINE 32G X 6 MM MISC     .  omeprazole (PRILOSEC) 40 MG capsule Take 40 mg by mouth 2 (two) times daily.     . ONE TOUCH ULTRA TEST test strip 1 each daily.     [name] DELICA LANCETS MISC daily.    [name] oxybutynin (DITROPAN XL) 15 MG 24 hr tablet Take 15 mg by mouth daily.      [name] oxyCODONE-acetaminophen (PERCOCET/ROXICET) 5-325 MG per tablet Take 1-2 tablets by mouth every 4 (four) hours as needed for moderate pain or severe pain. 12 tablet 0  . traMADol (ULTRAM) 50 MG tablet Take 50 mg by mouth every 6 (six) hours as needed for moderate pain.     [name] triamterene-hydrochlorothiazide (MAXZIDE-25) 37.5-25 MG per tablet Take 1 tablet by mouth daily.     [name] ZETIA 10 MG tablet TAKE 1 TABLET (10 MG TOTAL) BY MOUTH DAILY. 30 tablet 1   No current facility-administered medications for this visit.    ROS: See HPI for pertinent positives and negatives.   Physical Examination  There were no vitals filed for this visit. There is no weight on file to calculate BMI.  [name]: A&O x 3, obese male.  Gait: in wheelchair  Eyes: PERRLA,  Pulmonary: CTAB, without wheezes , rales or rhonchi  Cardiac: regular Rythm , without detected murmur   Carotid Bruits  Left  Right    Negative  Negative    Aorta: is not palpable  Radial pulses: 2+ palpable and =   VASCULAR EXAM:  Extremities without ischemic changes  without Gangrene; without open wounds. 2-3+ pitting edema at dorsum of left foot. 2+ pitting edema left left lower leg. Trace pitting edema in right lower leg. Large skin tear anterior aspect of left lower leg.  LE Pulses  LEFT  RIGHT   FEMORAL Not palpable Not palpable  POPLITEAL  not palpable  not palpable   POSTERIOR TIBIAL  not palpable  not palpable   DORSALIS PEDIS  ANTERIOR TIBIAL  not palpable  not palpable    Abdomen: soft, NT, no palpable masses.  Skin: no rashes, no ulcers.  Musculoskeletal: no muscle wasting or atrophy.  Neurologic: A&O X 3; Appropriate Affect, MOTOR FUNCTION: moving all extremities equally, motor strength 5/5 in  UE's, 3/5 in LE's. Speech is fluent/normal. CN 2-12 intact.          Non-Invasive Vascular Imaging: DATE: 09/17/2014 ABI (Date: 09/17/2014)  R: 0.56 (0.60, February 2016), DP: monophasic, PT: monophasic, TBI: 0.39  L: 0.66 (0.60), DP: monophasic, PT: monophasic, TBI: 0.40   ASSESSMENT: Hunter [name] is a 64 y.o. male who presents with stable and continued bilateral lower extremity severe arterial occlusive disease. He has had chronic venous insufficiency since veins harvested from both legs for CABG, more edema in left LE. Cellulitis of left foot resolved with Keflex. He has a large skin tear from removal of taped dressing over blister that was on anterior aspect left lower leg Stable ABI's: moderate bilateral arterial occlusive disease. Dr. Bridgett Larsson examined pt. Venous stasis ulcer left lower leg - una boot weekly x4  PLAN:  Una boot applied to left lower leg today and weekly x3 more, NP to evaluate at the last [employer] application. Elevate feet above heart for 20 minutes, 4x/day. NO TAPE TO SKIN.  Continue seated leg exercises daily. Based on the patient's vascular studies and examination, pt will return to clinic in 6 months with ABI's. He knows to return sooner for concerns re his circulation in his legs/feet.  I discussed in depth with  the patient the nature of atherosclerosis, and emphasized the importance of maximal medical management including strict control of blood pressure, blood glucose, and lipid levels, obtaining regular exercise, and continued cessation of smoking.  The patient is aware that without maximal medical management the underlying atherosclerotic disease process will progress, limiting the benefit of any interventions.  The patient was given information about PAD including signs, symptoms, treatment, what symptoms should prompt the patient to seek immediate medical care, and risk reduction measures to take.  Clemon Chambers, RN, MSN, FNP-C [facility] Phone: [phone]  Clinic MD: Bridgett Larsson  09/17/2014 2:02 PM

## 2014-09-17 NOTE — Progress Notes (Signed)
Applied unna boot to left leg.  Wound oozing clear fluid.  Patient is to return in one week.

## 2014-09-17 NOTE — Progress Notes (Signed)
Filed Vitals:   09/17/14 1403 09/17/14 1409  BP: 158/68 155/67  Pulse: 64 63  Temp: 97.4 F (36.3 C)   Resp: 16   Height: 5\' 6"  (1.676 m)   Weight: 206 lb (93.441 kg)   SpO2: 98%

## 2014-09-22 ENCOUNTER — Encounter: Payer: Self-pay | Admitting: Family

## 2014-09-24 ENCOUNTER — Ambulatory Visit (INDEPENDENT_AMBULATORY_CARE_PROVIDER_SITE_OTHER): Payer: Medicare Other

## 2014-09-24 DIAGNOSIS — I739 Peripheral vascular disease, unspecified: Secondary | ICD-10-CM | POA: Diagnosis not present

## 2014-09-24 NOTE — Progress Notes (Signed)
Mr. Balis was seen today for unna boot reapplication to his left lower extremity per Dr. [name]. The previous unna boot was removed. A new unna boot was reapplied. He will return next week for reapplication. Chanda Maness-Harrison CMA, AAMA

## 2014-09-29 ENCOUNTER — Encounter: Payer: Self-pay | Admitting: Family

## 2014-10-01 ENCOUNTER — Ambulatory Visit (INDEPENDENT_AMBULATORY_CARE_PROVIDER_SITE_OTHER): Payer: Medicare Other

## 2014-10-01 DIAGNOSIS — I739 Peripheral vascular disease, unspecified: Secondary | ICD-10-CM | POA: Diagnosis not present

## 2014-10-01 NOTE — Progress Notes (Signed)
[name] was seen today for an [employer] change Left Lower Leg.  New Unna Boot was applied and pt will return next week for Re- evaluation with NP. Melba Eldridge-Lewis, RMA- AMT

## 2014-10-05 ENCOUNTER — Encounter: Payer: Self-pay | Admitting: Family

## 2014-10-07 ENCOUNTER — Encounter: Payer: Self-pay | Admitting: Family

## 2014-10-07 ENCOUNTER — Ambulatory Visit (INDEPENDENT_AMBULATORY_CARE_PROVIDER_SITE_OTHER): Payer: Medicare Other | Admitting: Family

## 2014-10-07 VITALS — BP 137/65 | HR 63 | Temp 97.4°F | Resp 16 | Ht 65.0 in | Wt 206.0 lb

## 2014-10-07 DIAGNOSIS — E669 Obesity, unspecified: Secondary | ICD-10-CM | POA: Diagnosis not present

## 2014-10-07 DIAGNOSIS — I83029 Varicose veins of left lower extremity with ulcer of unspecified site: Secondary | ICD-10-CM

## 2014-10-07 DIAGNOSIS — E1151 Type 2 diabetes mellitus with diabetic peripheral angiopathy without gangrene: Secondary | ICD-10-CM | POA: Diagnosis not present

## 2014-10-07 DIAGNOSIS — L97929 Non-pressure chronic ulcer of unspecified part of left lower leg with unspecified severity: Secondary | ICD-10-CM

## 2014-10-07 DIAGNOSIS — Z87891 Personal history of nicotine dependence: Secondary | ICD-10-CM | POA: Diagnosis not present

## 2014-10-07 NOTE — Progress Notes (Signed)
[facility] [place] HISTORY AND PHYSICAL -PAD  History of Present Illness Hunter [name] is a 64 y.o. male patient of Dr. 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  Pt last saw Dr. [name] on 02/18/14. At that time bilateral ABI's were 0.6, unchanged dating back as far as 2009. Known underlying peripheral arterial disease stable.  Cellulitis of left lower extremity had resolved.   He returns today for last una boot placement and evaluation of venous stasis ulcers.     He does not ambulate, therefore cannot report claudication, but he does use his legs to move his wheelchair, and the pain in his leg lower leg is no worse when using his legs.  He denies rest pain in right leg, but states left lower leg "feels like it's on fire almost all the time", this has gradually worsened in the last year from 3/10 to 5/10.   He also reports that his last A1C was 7.2%, improved from over 8.5.  Gabapentin helps some of his left leg pain.  He denies non-healing wounds.  He denies history of stroke or TIA symptoms.  He did have veins harvested from both legs for his 4 vessel CABG in 2005, had swelling in both LE's since then, left lower leg more so.  He does wear compression hose on legs.  He reports sciatic pain in left leg, "like a hot poker"; had 6 low back surgeries and 2 c-spine surgeries.  He had sleep studies recently, is using CPAP, and feels more rested, no more daytime fatigue.  Pt reports New Medical or Surgical History: none.  He had a right cataract extraction with IOL Sept., 2014. Had bilateral blepharoplasty. Had injections in shoulders for pain.   Pt smoker: former smoker, quit in 2011 Pt meds include:  Statin :Yes  ASA: Yes  Other anticoagulants/antiplatelets: Plavix    Past Medical History  Diagnosis Date  . CAD (coronary artery disease)     a.  Remote MI with stent in 2005;  b. CABG x 5 in 2005;  c. 09/2010 Cath: LM nl, LAD 70p, 90-58m, D1 80p, LCX 54m, OM1 100, OM2 90ost, OM3 80, RCA 27m, VG->PDA->RPL nl, VG->Diag nl, VG->OM1 nl, LIMA->LAD nl, EF 60%-->Med Rx.  [name] COPD (chronic obstructive pulmonary disease) ([place])   . Diabetes mellitus   . PAD (peripheral artery disease) ([place])     followed by Dr. [name]  . Hypertension   . Tobacco dependence in remission     a. quit 2013.  . Increased liver enzymes     due to alcohol use  . Arthritis   . Occlusive disease, arterial ([place])     lower extremity  . H/O spinal cord injury     a. more or less wheelchair bound.  . Nocturia   . Cellulitis of left lower extremity     Social History Social History  Substance Use Topics  . Smoking status: Former Smoker -- 0.50 packs/day for 40 years    Types: Cigarettes    Quit date: 01/01/2009  . Smokeless tobacco: Never Used  . Alcohol Use: 0.0 oz/week    0 Standard drinks or equivalent per week     Comment: rare    Family History Family History  Problem Relation Age of Onset  . Other Father     WORK ACCIDENT  . Heart disease Father     Heart Disease before age  51  . Heart attack Mother   . Heart disease Mother   . Cancer Mother   . Diabetes Mother   . Other Brother     SUICIDE  . Heart disease Brother   . Diabetes Brother   . Heart attack Brother   . Coronary artery disease Sister     CABG  . Heart disease Sister   . Cancer Sister     Lung  . Diabetes Sister   . Heart attack Sister   . Coronary artery disease Brother   . COPD Sister     Past Surgical History  Procedure Laterality Date  . Coronary artery bypass graft  2005    LIMA to LAD, SVG to RCA & PD, SVG to OM 1, SVG to 1st DX; Dr. [name]  . Epispadius correction    . Femur surgery      RIGHT  . Back surgery      FUSIONS  . Spinal tumor    . Laminectomy    . Hand surgery      Left   . Eye surgery      BILATERAL TEAR DUCT  . Eye surgery Right Sept. 2014     Cataract    Allergies  Allergen Reactions  . Codeine Nausea And Vomiting  . Lisinopril Other (See Comments)    cough  . Ramipril Other (See Comments)    cough  . Tape Other (See Comments)    Blisters    Current Outpatient Prescriptions  Medication Sig Dispense Refill  . amLODipine (NORVASC) 5 MG tablet Take 1 tablet (5 mg total) by mouth daily. 30 tablet 6  . aspirin 81 MG tablet Take 81 mg by mouth daily.      [name] atorvastatin (LIPITOR) 10 MG tablet Take 5 tablets by mouth 2 (two) times daily.     . Canagliflozin (INVOKANA) 100 MG TABS Take 100 mg by mouth daily.     . cetirizine (ZYRTEC) 10 MG tablet Take 10 mg by mouth daily.    . chlorpheniramine (CHLOR-TABLETS) 4 MG tablet Take 4 mg by mouth daily.      . clopidogrel (PLAVIX) 75 MG tablet TAKE 1 TABLET BY MOUTH DAILY 30 tablet 5  . Docusate Calcium (STOOL SOFTENER PO) Take 100 mg by mouth daily.     [name] doxazosin (CARDURA) 2 MG tablet Take 2 mg by mouth daily.      [name] gabapentin (NEURONTIN) 300 MG capsule Take 300 mg by mouth 2 (two) times daily.     [name] HUMALOG KWIKPEN 100 UNIT/ML SOPN Inject 16 Units into the skin 3 (three) times daily with meals.     [name] ipratropium (ATROVENT) 0.06 % nasal spray Place 2 sprays into both nostrils 4 (four) times daily as needed for rhinitis.    [name] isosorbide mononitrate (IMDUR) 30 MG 24 hr tablet Take 1 tablet (30 mg total) by mouth daily. 30 tablet 6  . LEVEMIR FLEXPEN 100 UNIT/ML SOPN Inject 30 Units into the skin daily.     . metoprolol (LOPRESSOR) 50 MG tablet TAKE 1 TABLET (50 MG TOTAL) BY MOUTH 2 (TWO) TIMES DAILY. 60 tablet 5  . mometasone (NASONEX) 50 MCG/ACT nasal spray Place 2 sprays into the nose daily as needed (for nasal congestion).     . nitroGLYCERIN (NITROSTAT) 0.4 MG SL tablet Place 0.4 mg under the tongue every 5 (five) minutes as needed for chest pain (chest pain).    . NOVOFINE 32G X 6 MM MISC     .  omeprazole (PRILOSEC) 40 MG capsule Take 40 mg by mouth 2 (two) times daily.     . ONE TOUCH ULTRA TEST test strip 1 each daily.     [name] DELICA LANCETS MISC daily.    [name] oxybutynin (DITROPAN XL) 15 MG 24 hr tablet Take 15 mg by mouth daily.      . traMADol (ULTRAM) 50 MG tablet Take 50 mg by mouth every 6 (six) hours as needed for moderate pain.     [name] triamterene-hydrochlorothiazide (MAXZIDE-25) 37.5-25 MG per tablet Take 1 tablet by mouth daily.     [name] ZETIA 10 MG tablet TAKE 1 TABLET (10 MG TOTAL) BY MOUTH DAILY. 30 tablet 1  . oxyCODONE-acetaminophen (PERCOCET/ROXICET) 5-325 MG per tablet Take 1-2 tablets by mouth every 4 (four) hours as needed for moderate pain or severe pain. (Patient not taking: Reported on 09/17/2014) 12 tablet 0   No current facility-administered medications for this visit.    ROS: See HPI for pertinent positives and negatives.   Physical Examination  Filed Vitals:   10/07/14 0947 10/07/14 0952  BP: 154/72 137/65  Pulse: 63 63  Temp: 97.4 F (36.3 C)   TempSrc: Oral   Resp: 16   Height: 5\' 5"  (1.651 m)   Weight: 206 lb (93.441 kg)   SpO2: 97%    Body mass index is 34.28 kg/(m^2).  General: A&O x 3, obese male.  Gait: in wheelchair  Eyes: PERRLA,  Pulmonary: CTAB, without wheezes , rales or rhonchi  Cardiac: regular rhythm, no detected murmur   Carotid Bruits  Left  Right    Negative  Negative    Aorta: is not palpable  Radial pulses: 2+ palpable and =   VASCULAR EXAM:  Extremities without ischemic changes  without Gangrene; without open wounds. Trace pitting edema at dorsum of left foot. No edema in left left lower leg. Trace pitting edema in right lower leg. Large skin tear anterior aspect of left lower leg has healed.  LE Pulses  LEFT  RIGHT   FEMORAL Not palpable Not palpable  POPLITEAL  not palpable  not palpable   POSTERIOR TIBIAL  not palpable  not palpable   DORSALIS PEDIS  ANTERIOR TIBIAL  not palpable  not palpable    Abdomen:  soft, NT, no palpable masses.  Skin: no rashes, no ulcers.  Musculoskeletal: no muscle wasting or atrophy.  Neurologic: A&O X 3; Appropriate Affect, MOTOR FUNCTION: moving all extremities equally, motor strength 5/5 in UE's, 3/5 in LE's. Speech is fluent/normal. CN 2-12 intact.                ASSESSMENT: BALTAZAR PEKALA is a 64 y.o. male who presents with stable and continued bilateral lower extremity severe arterial occlusive disease. He has had chronic venous insufficiency since veins harvested from both legs for CABG, more edema in left LE. Cellulitis of left foot resolved with Keflex. He had a large skin tear from removal of taped dressing over blister that was on anterior aspect left lower leg; this has healed with 3 weekly una boot changes; edema has decreased in both lower extremities since the una boot therapy and pt seems to have been elevating his legs adequately when not exercising.  On 09/17/14: Stable ABI's: moderate bilateral arterial occlusive disease.    PLAN:  Based on the patient's vascular studies and examination, pt is due for ABI's in about 5 months,see me on this date. One more una boot today, return in a week for  removal (nurse visit), then resume knee high graduated compression hose daily. Pt knows to call us if he has concerns re his chronic venous stasis or PAD.   I discussed in depth with the patient the nature of atherosclerosis, and emphasized the importance of maximal medical management including strict control of blood pressure, blood glucose, and lipid levels, obtaining regular exercise, and continued cessation of smoking.  The patient is aware that without maximal medical management the underlying atherosclerotic disease process will progress, limiting the benefit of any interventions.  The patient was given information about PAD including signs, symptoms, treatment, what symptoms should prompt the patient to seek immediate medical care, and risk  reduction measures to take.  Clemon Chambers, RN, MSN, FNP-C [facility] Phone: [phone]  Clinic MD: [name]  10/07/2014 10:16 AM

## 2014-10-07 NOTE — Patient Instructions (Signed)
Venous Stasis or Chronic Venous Insufficiency  Chronic venous insufficiency, also called venous stasis, is a condition that affects the veins in the legs. The condition prevents blood from being pumped through these veins effectively. Blood may no longer be pumped effectively from the legs back to the heart. This condition can range from mild to severe. With proper treatment, you should be able to continue with an active life.  CAUSES   Chronic venous insufficiency occurs when the vein walls become stretched, weakened, or damaged or when valves within the vein are damaged. Some common causes of this include:  · High blood pressure inside the veins (venous hypertension).  · Increased blood pressure in the leg veins from long periods of sitting or standing.  · A blood clot that blocks blood flow in a vein (deep vein thrombosis).  · Inflammation of a superficial vein (phlebitis) that causes a blood clot to form.  RISK FACTORS  Various things can make you more likely to develop chronic venous insufficiency, including:  · Family history of this condition.  · Obesity.  · Pregnancy.  · Sedentary lifestyle.  · Smoking.  · Jobs requiring long periods of standing or sitting in one place.  · Being a certain age. Women in their 40s and 50s and men in their 70s are more likely to develop this condition.  SIGNS AND SYMPTOMS   Symptoms may include:   · Varicose veins.  · Skin breakdown or ulcers.  · Reddened or discolored skin on the leg.  · Brown, smooth, tight, and painful skin just above the ankle, usually on the inside surface (lipodermatosclerosis).  · Swelling.  DIAGNOSIS   To diagnose this condition, your health care provider will take a medical history and do a physical exam. The following tests may be ordered to confirm the diagnosis:  · Duplex ultrasound--A procedure that produces a picture of a blood vessel and nearby organs and also provides information on blood flow through the blood vessel.  · Plethysmography--A  procedure that tests blood flow.  · A venogram, or venography--A procedure used to look at the veins using X-ray and dye.  TREATMENT  The goals of treatment are to help you return to an active life and to minimize pain or disability. Treatment will depend on the severity of the condition. Medical procedures may be needed for severe cases. Treatment options may include:   · Use of compression stockings. These can help with symptoms and lower the chances of the problem getting worse, but they do not cure the problem.  · Sclerotherapy--A procedure involving an injection of a material that "dissolves" the damaged veins. Other veins in the network of blood vessels take over the function of the damaged veins.  · Surgery to remove the vein or cut off blood flow through the vein (vein stripping or laser ablation surgery).  · Surgery to repair a valve.  HOME CARE INSTRUCTIONS   · Wear compression stockings as directed by your health care provider.  · Only take over-the-counter or prescription medicines for pain, discomfort, or fever as directed by your health care provider.  · Follow up with your health care provider as directed.  SEEK MEDICAL CARE IF:   · You have redness, swelling, or increasing pain in the affected area.  · You see a red streak or line that extends up or down from the affected area.  · You have a breakdown or loss of skin in the affected area, even if the breakdown is   sudden numbness or weakness in the foot or ankle below the affected area, or you have trouble moving your foot or ankle.  You have a fever or persistent symptoms for more than 2-3 days.  You have a fever and your symptoms suddenly get worse. MAKE SURE YOU:   Understand these instructions.  Will watch your  condition.  Will get help right away if you are not doing well or get worse.   This information is not intended to replace advice given to you by your health care provider. Make sure you discuss any questions you have with your health care provider.   Document Released: 04/23/2006 Document Revised: 10/08/2012 Document Reviewed: 08/25/2012 Elsevier Interactive Patient Education 2016 Elsevier [employer]    Peripheral Vascular Disease Peripheral vascular disease (PVD) is a disease of the blood vessels that are not part of your heart and brain. A simple term for PVD is poor circulation. In most cases, PVD narrows the blood vessels that carry blood from your heart to the rest of your body. This can result in a decreased supply of blood to your arms, legs, and internal organs, like your stomach or kidneys. However, it most often affects a person's lower legs and feet. There are two types of PVD.  Organic PVD. This is the more common type. It is caused by damage to the structure of blood vessels.  Functional PVD. This is caused by conditions that make blood vessels contract and tighten (spasm). Without treatment, PVD tends to get worse over time. PVD can also lead to acute ischemic limb. This is when an arm or limb suddenly has trouble getting enough blood. This is a medical emergency. CAUSES Each type of PVD has many different causes. The most common cause of PVD is buildup of a fatty material (plaque) inside of your arteries (atherosclerosis). Small amounts of plaque can break off from the walls of the blood vessels and become lodged in a smaller artery. This blocks blood flow and can cause acute ischemic limb. Other common causes of PVD include:  Blood clots that form inside of blood vessels.  Injuries to blood vessels.  Diseases that cause inflammation of blood vessels or cause blood vessel spasms.  Health behaviors and health history that increase your risk of developing PVD. RISK FACTORS   You may have a greater risk of PVD if you:  Have a family history of PVD.  Have certain medical conditions, including:  High cholesterol.  Diabetes.  High blood pressure (hypertension).  Coronary heart disease.  Past problems with blood clots.  Past injury, such as burns or a broken bone. These may have damaged blood vessels in your limbs.  Buerger disease. This is caused by inflamed blood vessels in your hands and feet.  Some forms of arthritis.  Rare birth defects that affect the arteries in your legs.  Use tobacco.  Do not get enough exercise.  Are obese.  Are age 58 or older. SIGNS AND SYMPTOMS  PVD may cause many different symptoms. Your symptoms depend on what part of your body is not getting enough blood. Some common signs and symptoms include:  Cramps in your lower legs. This may be a symptom of poor leg circulation (claudication).  Pain and weakness in your legs while you are physically active that goes away when you rest (intermittent claudication).  Leg pain when at rest.  Leg numbness, tingling, or weakness.  Coldness in a leg or foot, especially when compared with the other leg.  Skin  or hair changes. These can include:  Hair loss.  Shiny skin.  Pale or bluish skin.  Thick toenails.  Inability to get or maintain an erection (erectile dysfunction). People with PVD are more prone to developing ulcers and sores on their toes, feet, or legs. These may take longer than normal to heal. DIAGNOSIS Your health care provider may diagnose PVD from your signs and symptoms. The health care provider will also do a physical exam. You may have tests to find out what is causing your PVD and determine its severity. Tests may include:  Blood pressure recordings from your arms and legs and measurements of the strength of your pulses (pulse volume recordings).  Imaging studies using sound waves to take pictures of the blood flow through your blood vessels  (Doppler ultrasound).  Injecting a dye into your blood vessels before having imaging studies using:  X-rays (angiogram or arteriogram).  Computer-generated X-rays (CT angiogram).  A powerful electromagnetic field and a computer (magnetic resonance angiogram or MRA). TREATMENT Treatment for PVD depends on the cause of your condition and the severity of your symptoms. It also depends on your age. Underlying causes need to be treated and controlled. These include long-lasting (chronic) conditions, such as diabetes, high cholesterol, and high blood pressure. You may need to first try making lifestyle changes and taking medicines. Surgery may be needed if these do not work. Lifestyle changes may include:  Quitting smoking.  Exercising regularly.  Following a low-fat, low-cholesterol diet. Medicines may include:  Blood thinners to prevent blood clots.  Medicines to improve blood flow.  Medicines to improve your blood cholesterol levels. Surgical procedures may include:  A procedure that uses an inflated balloon to open a blocked artery and improve blood flow (angioplasty).  A procedure to put in a tube (stent) to keep a blocked artery open (stent implant).  Surgery to reroute blood flow around a blocked artery (peripheral bypass surgery).  Surgery to remove dead tissue from an infected wound on the affected limb.  Amputation. This is surgical removal of the affected limb. This may be necessary in cases of acute ischemic limb that are not improved through medical or surgical treatments. HOME CARE INSTRUCTIONS  Take medicines only as directed by your health care provider.  Do not use any tobacco products, including cigarettes, chewing tobacco, or electronic cigarettes. If you need help quitting, ask your health care provider.  Lose weight if you are overweight, and maintain a healthy weight as directed by your health care provider.  Eat a diet that is low in fat and cholesterol.  If you need help, ask your health care provider.  Exercise regularly. Ask your health care provider to suggest some good activities for you.  Use compression stockings or other mechanical devices as directed by your health care provider.  Take good care of your feet.  Wear comfortable shoes that fit well.  Check your feet often for any cuts or sores. SEEK MEDICAL CARE IF:  You have cramps in your legs while walking.  You have leg pain when you are at rest.  You have coldness in a leg or foot.  Your skin changes.  You have erectile dysfunction.  You have cuts or sores on your feet that are not healing. SEEK IMMEDIATE MEDICAL CARE IF:  Your arm or leg turns cold and blue.  Your arms or legs become red, warm, swollen, painful, or numb.  You have chest pain or trouble breathing.  You suddenly have weakness in  your face, arm, or leg.  You become very confused or lose the ability to speak.  You suddenly have a very bad headache or lose your vision.   This information is not intended to replace advice given to you by your health care provider. Make sure you discuss any questions you have with your health care provider.   Document Released: 01/26/2004 Document Revised: 01/08/2014 Document Reviewed: 05/28/2013 Elsevier Interactive Patient Education [employer].

## 2014-10-07 NOTE — Progress Notes (Signed)
Filed Vitals:   10/07/14 0947 10/07/14 0952  BP: 154/72 137/65  Pulse: 63 63  Temp: 97.4 F (36.3 C)   TempSrc: Oral   Resp: 16   Height: 5\' 5"  (1.651 m)   Weight: 206 lb (93.441 kg)   SpO2: 97%

## 2014-10-08 DIAGNOSIS — R1030 Lower abdominal pain, unspecified: Secondary | ICD-10-CM | POA: Insufficient documentation

## 2014-10-12 ENCOUNTER — Encounter: Payer: Self-pay | Admitting: Family

## 2014-10-14 ENCOUNTER — Ambulatory Visit (INDEPENDENT_AMBULATORY_CARE_PROVIDER_SITE_OTHER): Payer: Medicare Other | Admitting: Family

## 2014-10-14 VITALS — BP 128/61 | HR 60 | Temp 98.0°F | Resp 16 | Ht 65.0 in | Wt 206.0 lb

## 2014-10-14 DIAGNOSIS — E1151 Type 2 diabetes mellitus with diabetic peripheral angiopathy without gangrene: Secondary | ICD-10-CM | POA: Diagnosis not present

## 2014-10-14 DIAGNOSIS — I872 Venous insufficiency (chronic) (peripheral): Secondary | ICD-10-CM

## 2014-10-14 NOTE — Progress Notes (Signed)
Nurse visit only for removal of 4th weekly una boot. I spoke with the pt briefly and examined his legs. He is pleased with the resolved edema in both lower legs and healed venous stasis ulcer of his left lower leg. He has ABI's and appointment with me in about 5 months. S. Cherell Colvin, NP-C

## 2014-10-15 DIAGNOSIS — I872 Venous insufficiency (chronic) (peripheral): Secondary | ICD-10-CM | POA: Insufficient documentation

## 2014-10-15 DIAGNOSIS — E1151 Type 2 diabetes mellitus with diabetic peripheral angiopathy without gangrene: Secondary | ICD-10-CM | POA: Insufficient documentation

## 2014-10-30 ENCOUNTER — Other Ambulatory Visit: Payer: Self-pay | Admitting: Cardiology

## 2014-11-30 ENCOUNTER — Other Ambulatory Visit: Payer: Self-pay | Admitting: Cardiology

## 2014-12-01 ENCOUNTER — Ambulatory Visit: Payer: Medicare Other | Admitting: Cardiology

## 2015-01-20 ENCOUNTER — Ambulatory Visit (INDEPENDENT_AMBULATORY_CARE_PROVIDER_SITE_OTHER): Payer: Medicare Other | Admitting: Cardiology

## 2015-01-20 ENCOUNTER — Encounter: Payer: Self-pay | Admitting: Cardiology

## 2015-01-20 VITALS — BP 126/62 | HR 60 | Ht 66.0 in | Wt 212.0 lb

## 2015-01-20 DIAGNOSIS — I10 Essential (primary) hypertension: Secondary | ICD-10-CM | POA: Diagnosis not present

## 2015-01-20 DIAGNOSIS — I872 Venous insufficiency (chronic) (peripheral): Secondary | ICD-10-CM

## 2015-01-20 DIAGNOSIS — I25709 Atherosclerosis of coronary artery bypass graft(s), unspecified, with unspecified angina pectoris: Secondary | ICD-10-CM | POA: Diagnosis not present

## 2015-01-20 NOTE — Patient Instructions (Signed)
Medication Instructions:  Your physician recommends that you continue on your current medications as directed. Please refer to the Current Medication list given to you today.  Labwork: none  Testing/Procedures: none  Follow-Up: Your physician wants you to follow-up in: 6 month ov with Dr Dawna Part will receive a reminder letter in the mail two months in advance. If you don't receive a letter, please call our office to schedule the follow-up appointment.  Any Other Special Instructions Will Be Listed Below (If Applicable). Work harder on weight loss   If you need a refill on your cardiac medications before your next appointment, please call your pharmacy.

## 2015-01-20 NOTE — Progress Notes (Signed)
Cardiology Office Note   Date:  01/20/2015   ID:  Hunter [name], DOB 1950-10-13, MRN [id number]  PCP:  [name], MD  Cardiologist: [name] MD  Chief Complaint  Patient presents with  . routine office visit    Patient denies chest pain, shortness of breath, le edema, or claudication      History of Present Illness: Hunter [name] is a 65 y.o. male who presents for scheduled 6 month visit  . He has a history of known ischemic heart disease. He had a myocardial infarction with stent in 2005. The patient underwent coronary artery bypass graft surgery also in 2005. He had increasing chest pain in September 2012 and underwent cardiac catheterization by Dr. [name] on 09/12/10 showed normal left ventricular systolic function and showed that all of his grafts were patent. Patient does have occasional chest discomfort. He has not had to take any recent sublingual nitroglycerin. He quit smoking 3 years ago and this has helped his breathing and also his chest pain. His appetite is good and he has been gaining weight. He is in a wheelchair. He does not get any regular exercise.he has an exercise bike at home but has not been using it. In April 2016 he had increasing symptoms of chest discomfort and dyspnea. He had an echocardiogram on 04/27/14 which showed an ejection fraction of 50-55% and there was grade 2 diastolic dysfunction. The patient also had a Lexiscan Myoview stress test on 426/16 which showed no evidence of ischemia and his ejection fraction was 50%. The patient has gained weight. The patient reports that he has not had to take sublingual nitroglycerin often. He takes it occasionally and when he does take it seems to help. At his last work in visit he was given isosorbide mononitrate but was unable to take it long term because of severe headache. This had happened once before as well. Patient reports that since we last saw him he had had worsening problems  with peripheral edema.  He is now wearing support hose and the edema is much improved.  Past Medical History  Diagnosis Date  . CAD (coronary artery disease)     a. Remote MI with stent in 2005;  b. CABG x 5 in 2005;  c. 09/2010 Cath: LM nl, LAD 70p, 90-51m, D1 80p, LCX 11m, OM1 100, OM2 90ost, OM3 80, RCA 39m, VG->PDA->RPL nl, VG->Diag nl, VG->OM1 nl, LIMA->LAD nl, EF 60%-->Med Rx.  [name] COPD (chronic obstructive pulmonary disease) ([place])   . Diabetes mellitus   . PAD (peripheral artery disease) ([place])     followed by Dr. [name]  . Hypertension   . Tobacco dependence in remission     a. quit 2013.  . Increased liver enzymes     due to alcohol use  . Arthritis   . 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 H/O spinal cord injury     a. more or less wheelchair bound.  . Nocturia   . Cellulitis of left lower extremity     Past Surgical History  Procedure Laterality Date  . Coronary artery bypass graft  2005    LIMA to LAD, SVG to RCA & PD, SVG to OM 1, SVG to 1st DX; Dr. [name]  . Epispadius correction    . Femur surgery      RIGHT  . Back surgery      FUSIONS  . Spinal tumor    . Laminectomy    .  Hand surgery      Left   . Eye surgery      BILATERAL TEAR DUCT  . Eye surgery Right Sept. 2014    Cataract     Current Outpatient Prescriptions  Medication Sig Dispense Refill  . amLODipine (NORVASC) 5 MG tablet Take 1 tablet (5 mg total) by mouth daily. 30 tablet 6  . aspirin 81 MG tablet Take 81 mg by mouth daily.      [name] atorvastatin (LIPITOR) 10 MG tablet Take 5 tablets by mouth 2 (two) times daily.     . Canagliflozin (INVOKANA) 100 MG TABS Take 100 mg by mouth daily.     . cetirizine (ZYRTEC) 10 MG tablet Take 10 mg by mouth daily.    . chlorpheniramine (CHLOR-TABLETS) 4 MG tablet Take 4 mg by mouth daily.      . clopidogrel (PLAVIX) 75 MG tablet TAKE 1 TABLET BY MOUTH DAILY 30 tablet 5  . Docusate Calcium (STOOL SOFTENER PO) Take 100 mg by mouth daily.      [name] doxazosin (CARDURA) 2 MG tablet Take 2 mg by mouth daily.      [name] gabapentin (NEURONTIN) 300 MG capsule Take 300 mg by mouth 2 (two) times daily.     [name] HUMALOG KWIKPEN 100 UNIT/ML SOPN Inject 16 Units into the skin 3 (three) times daily with meals.     [name] ipratropium (ATROVENT) 0.06 % nasal spray Place 2 sprays into both nostrils 4 (four) times daily as needed for rhinitis.    [name] isosorbide mononitrate (IMDUR) 30 MG 24 hr tablet Take 1 tablet (30 mg total) by mouth daily. 30 tablet 6  . LEVEMIR FLEXPEN 100 UNIT/ML SOPN Inject 30 Units into the skin daily.     . metoprolol (LOPRESSOR) 50 MG tablet TAKE 1 TABLET (50 MG TOTAL) BY MOUTH 2 (TWO) TIMES DAILY. 60 tablet 5  . mometasone (NASONEX) 50 MCG/ACT nasal spray Place 2 sprays into the nose daily as needed (for nasal congestion).     . nitroGLYCERIN (NITROSTAT) 0.4 MG SL tablet Place 0.4 mg under the tongue every 5 (five) minutes as needed for chest pain (chest pain).    . NOVOFINE 32G X 6 MM MISC     . omeprazole (PRILOSEC) 40 MG capsule Take 40 mg by mouth 2 (two) times daily.    . ONE TOUCH ULTRA TEST test strip 1 each daily.     [name] DELICA LANCETS MISC daily.    [name] oxybutynin (DITROPAN XL) 15 MG 24 hr tablet Take 15 mg by mouth daily.      [name] oxyCODONE-acetaminophen (PERCOCET/ROXICET) 5-325 MG per tablet Take 1-2 tablets by mouth every 4 (four) hours as needed for moderate pain or severe pain. 12 tablet 0  . triamterene-hydrochlorothiazide (MAXZIDE-25) 37.5-25 MG per tablet Take 1 tablet by mouth daily.     [name] ZETIA 10 MG tablet TAKE 1 TABLET (10 MG TOTAL) BY MOUTH DAILY. 30 tablet 1   No current facility-administered medications for this visit.    Allergies:   Codeine; Lisinopril; Ramipril; and Tape    Social History:  The patient  reports that he quit smoking about 6 years ago. His smoking use included Cigarettes. He has a 20 pack-year smoking history. He has never used smokeless tobacco. He reports that he drinks alcohol. He  reports that he does not use illicit drugs.   Family History:  The patient's family history includes COPD in his sister; Cancer in his mother and sister; Coronary  artery disease in his brother and sister; Diabetes in his brother, mother, and sister; Heart attack in his brother, mother, and sister; Heart disease in his brother, father, mother, and sister; Other in his brother and father.    ROS:  Please see the history of present illness.   Otherwise, review of systems are positive for none.   All other systems are reviewed and negative.    PHYSICAL EXAM: VS:  BP 126/62 mmHg  Pulse 60  Ht 5\' 6"  (1.676 m)  Wt 212 lb (96.163 kg)  BMI 34.23 kg/m2 , BMI Body mass index is 34.23 kg/(m^2). GEN: Well nourished, well developed, in no acute distress HEENT: normal Neck: no JVD, carotid bruits, or masses Cardiac: RRR; no murmurs, rubs, or gallops,no edema  Respiratory:  clear to auscultation bilaterally, normal work of breathing GI: soft, nontender, nondistended, + BS MS: no deformity or atrophy Skin: warm and dry, no rash Neuro:  Strength and sensation are intact Psych: euthymic mood, full affect   EKG:  EKG is ordered today. The ekg ordered today demonstrates normal sinus rhythm with pattern of an old inferior wall myocardial infarction and anterolateral T wave changes.  Since previous tracing of 04/14/14, no significant change   Recent Labs: No results found for requested labs within last 365 days.    Lipid Panel    Component Value Date/Time   CHOL 105 03/31/2010 0910   TRIG 97.0 03/31/2010 0910   HDL 26.00* 03/31/2010 0910   CHOLHDL 4 03/31/2010 0910   VLDL 19.4 03/31/2010 0910   LDLCALC 60 03/31/2010 0910      Wt Readings from Last 3 Encounters:  01/20/15 212 lb (96.163 kg)  10/14/14 206 lb (93.441 kg)  10/07/14 206 lb (93.441 kg)         ASSESSMENT AND PLAN:  1. Ischemic heart disease with prior myocardial infarction 2005. Subsequent CABG 2005. Most recent  cardiac catheterization 09/12/10 showing all of his grafts were patent.most recent Myoview4/27/16 showed no reversible ischemia 2. Benign hypertensive heart disease without heart failure 3. type 2 diabetes mellitus without mention of complication, uncontrolled 4. Obesity 5. possible sleep apnea 6. Chronic venous insufficiency of legs with edema, improved with support hose   Current medicines are reviewed at length with the patient today.  The patient does not have concerns regarding medicines.  The following changes have been made:  no change  Labs/ tests ordered today include:   Orders Placed This Encounter  Procedures  . EKG 12-Lead    Disposition: Continue current medication.  Work harder on weight loss.  Needs to start using his exercise bike again at home.  Recheck in 6 months for office visit with Dr. Kingsley Plan  Signed, [name] MD 01/20/2015 1:25 PM    [facility] [address], [employer], [place]  [address] Phone: [phone]; Fax: [phone]

## 2015-03-07 ENCOUNTER — Encounter: Payer: Self-pay | Admitting: Family

## 2015-03-10 ENCOUNTER — Encounter: Payer: Self-pay | Admitting: Family

## 2015-03-10 ENCOUNTER — Other Ambulatory Visit: Payer: Self-pay | Admitting: *Deleted

## 2015-03-10 ENCOUNTER — Ambulatory Visit (INDEPENDENT_AMBULATORY_CARE_PROVIDER_SITE_OTHER): Payer: Medicare Other | Admitting: Family

## 2015-03-10 ENCOUNTER — Ambulatory Visit (HOSPITAL_COMMUNITY)
Admission: RE | Admit: 2015-03-10 | Discharge: 2015-03-10 | Disposition: A | Discharge status: Home / Self Care | Payer: Medicare Other | Source: Ambulatory Visit | Attending: Family | Admitting: Family

## 2015-03-10 VITALS — BP 146/75 | HR 59 | Ht 66.0 in | Wt 212.0 lb

## 2015-03-10 DIAGNOSIS — I83029 Varicose veins of left lower extremity with ulcer of unspecified site: Secondary | ICD-10-CM

## 2015-03-10 DIAGNOSIS — Z6834 Body mass index (BMI) 34.0-34.9, adult: Secondary | ICD-10-CM | POA: Diagnosis not present

## 2015-03-10 DIAGNOSIS — I10 Essential (primary) hypertension: Secondary | ICD-10-CM | POA: Insufficient documentation

## 2015-03-10 DIAGNOSIS — E1151 Type 2 diabetes mellitus with diabetic peripheral angiopathy without gangrene: Secondary | ICD-10-CM | POA: Insufficient documentation

## 2015-03-10 DIAGNOSIS — R938 Abnormal findings on diagnostic imaging of other specified body structures: Secondary | ICD-10-CM | POA: Diagnosis not present

## 2015-03-10 DIAGNOSIS — Z87891 Personal history of nicotine dependence: Secondary | ICD-10-CM | POA: Diagnosis not present

## 2015-03-10 DIAGNOSIS — E669 Obesity, unspecified: Secondary | ICD-10-CM | POA: Insufficient documentation

## 2015-03-10 DIAGNOSIS — I739 Peripheral vascular disease, unspecified: Secondary | ICD-10-CM

## 2015-03-10 DIAGNOSIS — I872 Venous insufficiency (chronic) (peripheral): Secondary | ICD-10-CM | POA: Diagnosis not present

## 2015-03-10 DIAGNOSIS — L97929 Non-pressure chronic ulcer of unspecified part of left lower leg with unspecified severity: Secondary | ICD-10-CM

## 2015-03-10 DIAGNOSIS — I779 Disorder of arteries and arterioles, unspecified: Secondary | ICD-10-CM | POA: Diagnosis not present

## 2015-03-10 NOTE — Progress Notes (Signed)
[facility] [place] HISTORY AND PHYSICAL -PAD  History of Present Illness Hunter [name] is a 65 y.o. male patient of Dr. 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 He is unable to walk and has been wheelchair bound.   Pt last saw Dr. [name] on 02/18/14. At that time bilateral ABI's were 0.6, unchanged dating back as far as 2009. Known underlying peripheral arterial disease stable.  Cellulitis of left lower extremity had resolved.   He returns today for follow up of PAOD and chronic venous insufficiency.    He does not ambulate, therefore cannot report claudication, but he does use his legs to move his wheelchair, and the pain in his leg lower leg is no worse when using his legs.  He denies rest pain in right leg, but states left lower leg "feels like it's on fire almost all the time", this has gradually worsened in the last year from 3/10 to 5/10.   He also reports that his last A1C was 7.2%, improved from over 8.5.  Gabapentin helps some of his left leg pain.  He denies non-healing wounds.  He denies history of stroke or TIA symptoms.  He did have veins harvested from both legs for his 4 vessel CABG in 2005, had swelling in both LE's since then, left lower leg more so.  He does wear compression hose on legs.  He reports sciatic pain in left leg, "like a hot poker"; had 6 low back surgeries and 2 c-spine surgeries.  He had sleep studies recently, is using CPAP, and feels more rested, no more daytime fatigue.  Pt reports New Medical or Surgical History: none.  He had a right cataract extraction with IOL Sept., 2014. Had bilateral blepharoplasty. Had injections in shoulders for pain.   Pt smoker: former smoker, quit in 2011 Pt meds include:  Statin :Yes  ASA: Yes  Other anticoagulants/antiplatelets: Plavix      Past Medical History  Diagnosis Date   . CAD (coronary artery disease)     a. Remote MI with stent in 2005;  b. CABG x 5 in 2005;  c. 09/2010 Cath: LM nl, LAD 70p, 90-63m, D1 80p, LCX 61m, OM1 100, OM2 90ost, OM3 80, RCA 75m, VG->PDA->RPL nl, VG->Diag nl, VG->OM1 nl, LIMA->LAD nl, EF 60%-->Med Rx.  [name] COPD (chronic obstructive pulmonary disease) ([place])   . Diabetes mellitus   . PAD (peripheral artery disease) ([place])     followed by Dr. [name]  . Hypertension   . Tobacco dependence in remission     a. quit 2013.  . Increased liver enzymes     due to alcohol use  . Arthritis   . 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 H/O spinal cord injury     a. more or less wheelchair bound.  . Nocturia   . Cellulitis of left lower extremity     Social History Social History  Substance Use Topics  . Smoking status: Former Smoker -- 0.50 packs/day for 40 years    Types: Cigarettes    Quit date: 01/01/2009  . Smokeless tobacco: Never Used  . Alcohol Use: 0.0 oz/week    0 Standard drinks or equivalent per week     Comment: rare    Family History Family History  Problem Relation Age of Onset  . Other Father     WORK ACCIDENT  . Heart  disease Father     Heart Disease before age 76  . Heart attack Mother   . Heart disease Mother   . Cancer Mother   . Diabetes Mother   . Other Brother     SUICIDE  . Heart disease Brother   . Diabetes Brother   . Heart attack Brother   . Coronary artery disease Sister     CABG  . Heart disease Sister   . Cancer Sister     Lung  . Diabetes Sister   . Heart attack Sister   . Coronary artery disease Brother   . COPD Sister     Past Surgical History  Procedure Laterality Date  . Coronary artery bypass graft  2005    LIMA to LAD, SVG to RCA & PD, SVG to OM 1, SVG to 1st DX; Dr. [name]  . Epispadius correction    . Femur surgery      RIGHT  . Back surgery      FUSIONS  . Spinal tumor    . Laminectomy    . Hand surgery      Left   . Eye surgery      BILATERAL  TEAR DUCT  . Eye surgery Right Sept. 2014    Cataract    Allergies  Allergen Reactions  . Codeine Nausea And Vomiting  . Lisinopril Other (See Comments)    cough  . Ramipril Other (See Comments)    cough  . Tape Other (See Comments) and Tinitus    Only plastic tape--can use paper tape OK Blisters    Current Outpatient Prescriptions  Medication Sig Dispense Refill  . amLODipine (NORVASC) 5 MG tablet Take 1 tablet (5 mg total) by mouth daily. 30 tablet 6  . aspirin 81 MG tablet Take 81 mg by mouth daily.      [name] atorvastatin (LIPITOR) 10 MG tablet Take 5 tablets by mouth 2 (two) times daily.     . Canagliflozin (INVOKANA) 100 MG TABS Take 100 mg by mouth daily.     . cetirizine (ZYRTEC) 10 MG tablet Take 10 mg by mouth daily.    . chlorpheniramine (CHLOR-TABLETS) 4 MG tablet Take 4 mg by mouth daily.      . clopidogrel (PLAVIX) 75 MG tablet TAKE 1 TABLET BY MOUTH DAILY 30 tablet 5  . Docusate Calcium (STOOL SOFTENER PO) Take 100 mg by mouth daily.     [name] doxazosin (CARDURA) 2 MG tablet Take 2 mg by mouth daily.      [name] gabapentin (NEURONTIN) 300 MG capsule Take 300 mg by mouth 2 (two) times daily.     [name] HUMALOG KWIKPEN 100 UNIT/ML SOPN Inject 16 Units into the skin 3 (three) times daily with meals.     [name] ipratropium (ATROVENT) 0.06 % nasal spray Place 2 sprays into both nostrils 4 (four) times daily as needed for rhinitis.    [name] isosorbide mononitrate (IMDUR) 30 MG 24 hr tablet Take 1 tablet (30 mg total) by mouth daily. 30 tablet 6  . LEVEMIR FLEXPEN 100 UNIT/ML SOPN Inject 30 Units into the skin daily.     . metoprolol (LOPRESSOR) 50 MG tablet TAKE 1 TABLET (50 MG TOTAL) BY MOUTH 2 (TWO) TIMES DAILY. 60 tablet 5  . mometasone (NASONEX) 50 MCG/ACT nasal spray Place 2 sprays into the nose daily as needed (for nasal congestion).     . nitroGLYCERIN (NITROSTAT) 0.4 MG SL tablet Place 0.4 mg under the tongue every 5 (five) minutes  as needed for chest pain (chest pain).    . NOVOFINE 32G X  6 MM MISC     . omeprazole (PRILOSEC) 40 MG capsule Take 40 mg by mouth 2 (two) times daily.    . ONE TOUCH ULTRA TEST test strip 1 each daily.     [name] DELICA LANCETS MISC daily.    [name] oxybutynin (DITROPAN XL) 15 MG 24 hr tablet Take 15 mg by mouth daily.      [name] triamterene-hydrochlorothiazide (MAXZIDE-25) 37.5-25 MG per tablet Take 1 tablet by mouth daily.     [name] ZETIA 10 MG tablet TAKE 1 TABLET (10 MG TOTAL) BY MOUTH DAILY. 30 tablet 1  . oxyCODONE-acetaminophen (PERCOCET/ROXICET) 5-325 MG per tablet Take 1-2 tablets by mouth every 4 (four) hours as needed for moderate pain or severe pain. (Patient not taking: Reported on 03/10/2015) 12 tablet 0   No current facility-administered medications for this visit.    ROS: See HPI for pertinent positives and negatives.   Physical Examination  Filed Vitals:   03/10/15 1025 03/10/15 1029  BP: 159/72 146/75  Pulse: 59   Height: 5\' 6"  (1.676 m)   Weight: 212 lb (96.163 kg)   SpO2: 98%    Body mass index is 34.23 kg/(m^2).  General: A&O x 3, obese male.  Gait: in wheelchair  Eyes: PERRLA,  Pulmonary: CTAB, without wheezes , rales or rhonchi  Cardiac: regular rhythm, no detected murmur   Carotid Bruits  Left  Right    Negative  Negative    Aorta: is not palpable  Radial pulses: 2+ palpable and =   VASCULAR EXAM:  Extremities without ischemic changes  without Gangrene; without open wounds. Trace pitting edema at dorsum of left foot. No edema in left left lower leg. Trace pitting edema in right lower leg. Large skin tear anterior aspect of left lower leg has healed.  LE Pulses  LEFT  RIGHT   FEMORAL Not palpable Not palpable  POPLITEAL  not palpable  not palpable   POSTERIOR TIBIAL  not palpable  not palpable   DORSALIS PEDIS  ANTERIOR TIBIAL  not palpable  not palpable    Abdomen: soft, NT, no palpable masses.  Skin: no rashes, no  ulcers.  Musculoskeletal: moderate muscle wasting/ atrophy in legs.  Neurologic: A&O X 3; Appropriate Affect, MOTOR FUNCTION: moving all extremities, motor strength 5/5 in UE's, 3/5 in LE's. Speech is fluent/normal. CN 2-12 intact.                      Non-Invasive Vascular Imaging: DATE: 03/10/2015   ASSESSMENT: Hunter [name] is a 65 y.o. male who presents with stable and continued bilateral lower extremity moderate arterial occlusive disease. He has had chronic venous insufficiency since veins harvested from both legs for CABG, more edema in left LE. Cellulitis of left foot resolved with Keflex.  Venous stasis ulcers have healed with Unna boot therapy, use of knee high compression hose during the day, and elevation of legs as much as possible   PAD remains stable with daily exercising of legs. ABI's indicate stable bilateral moderate arterial occlusive disease with all monophasic waveforms.     PLAN:  Based on the patient's vascular studies and examination, pt will return to clinic in 6 months with ABI's. Continue daily exercising and use of knee high compression hose. I advised pt to notify us if he develops non healing wounds or any concerns re the circulation in his legs.  I discussed in depth with the patient the nature of atherosclerosis, and emphasized the importance of maximal medical management including strict control of blood pressure, blood glucose, and lipid levels, obtaining regular exercise, and continued cessation of smoking.  The patient is aware that without maximal medical management the underlying atherosclerotic disease process will progress, limiting the benefit of any interventions.  The patient was given information about PAD including signs, symptoms, treatment, what symptoms should prompt the patient to seek immediate medical care, and risk reduction measures to take.  Clemon Chambers, RN, MSN, FNP-C [facility]  [employer] Phone: [phone]  Clinic MD: [facility] [place]  03/10/2015 10:26 AM

## 2015-03-10 NOTE — Patient Instructions (Signed)
Peripheral Vascular Disease  Peripheral vascular disease (PVD) is a disease of the blood vessels that are not part of your heart and brain. A simple term for PVD is poor circulation. In most cases, PVD narrows the blood vessels that carry blood from your heart to the rest of your body. This can result in a decreased supply of blood to your arms, legs, and internal organs, like your stomach or kidneys. However, it most often affects a person's lower legs and feet.  There are two types of PVD.  · Organic PVD. This is the more common type. It is caused by damage to the structure of blood vessels.  · Functional PVD. This is caused by conditions that make blood vessels contract and tighten (spasm).  Without treatment, PVD tends to get worse over time.  PVD can also lead to acute ischemic limb. This is when an arm or limb suddenly has trouble getting enough blood. This is a medical emergency.  CAUSES  Each type of PVD has many different causes. The most common cause of PVD is buildup of a fatty material (plaque) inside of your arteries (atherosclerosis). Small amounts of plaque can break off from the walls of the blood vessels and become lodged in a smaller artery. This blocks blood flow and can cause acute ischemic limb.  Other common causes of PVD include:  · Blood clots that form inside of blood vessels.  · Injuries to blood vessels.  · Diseases that cause inflammation of blood vessels or cause blood vessel spasms.  · Health behaviors and health history that increase your risk of developing PVD.  RISK FACTORS   You may have a greater risk of PVD if you:  · Have a family history of PVD.  · Have certain medical conditions, including:    High cholesterol.    Diabetes.    High blood pressure (hypertension).    Coronary heart disease.    Past problems with blood clots.    Past injury, such as burns or a broken bone. These may have damaged blood vessels in your limbs.    Buerger disease. This is caused by inflamed blood  vessels in your hands and feet.    Some forms of arthritis.    Rare birth defects that affect the arteries in your legs.  · Use tobacco.  · Do not get enough exercise.  · Are obese.  · Are age 50 or older.  SIGNS AND SYMPTOMS   PVD may cause many different symptoms. Your symptoms depend on what part of your body is not getting enough blood. Some common signs and symptoms include:  · Cramps in your lower legs. This may be a symptom of poor leg circulation (claudication).  · Pain and weakness in your legs while you are physically active that goes away when you rest (intermittent claudication).  · Leg pain when at rest.  · Leg numbness, tingling, or weakness.  · Coldness in a leg or foot, especially when compared with the other leg.  · Skin or hair changes. These can include:    Hair loss.    Shiny skin.    Pale or bluish skin.    Thick toenails.  · Inability to get or maintain an erection (erectile dysfunction).  People with PVD are more prone to developing ulcers and sores on their toes, feet, or legs. These may take longer than normal to heal.  DIAGNOSIS  Your health care provider may diagnose PVD from your signs and symptoms.   The health care provider will also do a physical exam. You may have tests to find out what is causing your PVD and determine its severity. Tests may include:  · Blood pressure recordings from your arms and legs and measurements of the strength of your pulses (pulse volume recordings).  · Imaging studies using sound waves to take pictures of the blood flow through your blood vessels (Doppler ultrasound).  · Injecting a dye into your blood vessels before having imaging studies using:    X-rays (angiogram or arteriogram).    Computer-generated X-rays (CT angiogram).    A powerful electromagnetic field and a computer (magnetic resonance angiogram or MRA).  TREATMENT  Treatment for PVD depends on the cause of your condition and the severity of your symptoms. It also depends on your age. Underlying  causes need to be treated and controlled. These include long-lasting (chronic) conditions, such as diabetes, high cholesterol, and high blood pressure. You may need to first try making lifestyle changes and taking medicines. Surgery may be needed if these do not work.  Lifestyle changes may include:  · Quitting smoking.  · Exercising regularly.  · Following a low-fat, low-cholesterol diet.  Medicines may include:  · Blood thinners to prevent blood clots.  · Medicines to improve blood flow.  · Medicines to improve your blood cholesterol levels.  Surgical procedures may include:  · A procedure that uses an inflated balloon to open a blocked artery and improve blood flow (angioplasty).  · A procedure to put in a tube (stent) to keep a blocked artery open (stent implant).  · Surgery to reroute blood flow around a blocked artery (peripheral bypass surgery).  · Surgery to remove dead tissue from an infected wound on the affected limb.  · Amputation. This is surgical removal of the affected limb. This may be necessary in cases of acute ischemic limb that are not improved through medical or surgical treatments.  HOME CARE INSTRUCTIONS  · Take medicines only as directed by your health care provider.  · Do not use any tobacco products, including cigarettes, chewing tobacco, or electronic cigarettes.  If you need help quitting, ask your health care provider.  · Lose weight if you are overweight, and maintain a healthy weight as directed by your health care provider.  · Eat a diet that is low in fat and cholesterol. If you need help, ask your health care provider.  · Exercise regularly. Ask your health care provider to suggest some good activities for you.  · Use compression stockings or other mechanical devices as directed by your health care provider.  · Take good care of your feet.    Wear comfortable shoes that fit well.    Check your feet often for any cuts or sores.  SEEK MEDICAL CARE IF:  · You have cramps in your legs  while walking.  · You have leg pain when you are at rest.  · You have coldness in a leg or foot.  · Your skin changes.  · You have erectile dysfunction.  · You have cuts or sores on your feet that are not healing.  SEEK IMMEDIATE MEDICAL CARE IF:  · Your arm or leg turns cold and blue.  · Your arms or legs become red, warm, swollen, painful, or numb.  · You have chest pain or trouble breathing.  · You suddenly have weakness in your face, arm, or leg.  · You become very confused or lose the ability to speak.  ·   You suddenly have a very bad headache or lose your vision.     This information is not intended to replace advice given to you by your health care provider. Make sure you discuss any questions you have with your health care provider.     Document Released: 01/26/2004 Document Revised: 01/08/2014 Document Reviewed: 05/28/2013  Elsevier Interactive Patient Education ©2016 [employer]    Venous Stasis or Chronic Venous Insufficiency  Chronic venous insufficiency, also called venous stasis, is a condition that affects the veins in the legs. The condition prevents blood from being pumped through these veins effectively. Blood may no longer be pumped effectively from the legs back to the heart. This condition can range from mild to severe. With proper treatment, you should be able to continue with an active life.  CAUSES   Chronic venous insufficiency occurs when the vein walls become stretched, weakened, or damaged or when valves within the vein are damaged. Some common causes of this include:  · High blood pressure inside the veins (venous hypertension).  · Increased blood pressure in the leg veins from long periods of sitting or standing.  · A blood clot that blocks blood flow in a vein (deep vein thrombosis).  · Inflammation of a superficial vein (phlebitis) that causes a blood clot to form.  RISK FACTORS  Various things can make you more likely to develop chronic venous insufficiency, including:  · Family  history of this condition.  · Obesity.  · Pregnancy.  · Sedentary lifestyle.  · Smoking.  · Jobs requiring long periods of standing or sitting in one place.  · Being a certain age. Women in their 40s and 50s and men in their 70s are more likely to develop this condition.  SIGNS AND SYMPTOMS   Symptoms may include:   · Varicose veins.  · Skin breakdown or ulcers.  · Reddened or discolored skin on the leg.  · Brown, smooth, tight, and painful skin just above the ankle, usually on the inside surface (lipodermatosclerosis).  · Swelling.  DIAGNOSIS   To diagnose this condition, your health care provider will take a medical history and do a physical exam. The following tests may be ordered to confirm the diagnosis:  · Duplex ultrasound--A procedure that produces a picture of a blood vessel and nearby organs and also provides information on blood flow through the blood vessel.  · Plethysmography--A procedure that tests blood flow.  · A venogram, or venography--A procedure used to look at the veins using X-ray and dye.  TREATMENT  The goals of treatment are to help you return to an active life and to minimize pain or disability. Treatment will depend on the severity of the condition. Medical procedures may be needed for severe cases. Treatment options may include:   · Use of compression stockings. These can help with symptoms and lower the chances of the problem getting worse, but they do not cure the problem.  · Sclerotherapy--A procedure involving an injection of a material that "dissolves" the damaged veins. Other veins in the network of blood vessels take over the function of the damaged veins.  · Surgery to remove the vein or cut off blood flow through the vein (vein stripping or laser ablation surgery).  · Surgery to repair a valve.  HOME CARE INSTRUCTIONS   · Wear compression stockings as directed by your health care provider.  · Only take over-the-counter or prescription medicines for pain, discomfort, or fever as  directed by your health   care provider.  · Follow up with your health care provider as directed.  SEEK MEDICAL CARE IF:   · You have redness, swelling, or increasing pain in the affected area.  · You see a red streak or line that extends up or down from the affected area.  · You have a breakdown or loss of skin in the affected area, even if the breakdown is small.  · You have an injury to the affected area.  SEEK IMMEDIATE MEDICAL CARE IF:   · You have an injury and open wound in the affected area.  · Your pain is severe and does not improve with medicine.  · You have sudden numbness or weakness in the foot or ankle below the affected area, or you have trouble moving your foot or ankle.  · You have a fever or persistent symptoms for more than 2-3 days.  · You have a fever and your symptoms suddenly get worse.  MAKE SURE YOU:   · Understand these instructions.  · Will watch your condition.  · Will get help right away if you are not doing well or get worse.     This information is not intended to replace advice given to you by your health care provider. Make sure you discuss any questions you have with your health care provider.     Document Released: 04/23/2006 Document Revised: 10/08/2012 Document Reviewed: 08/25/2012  Elsevier Interactive Patient Education ©2016 [employer]

## 2015-05-03 ENCOUNTER — Encounter: Payer: Self-pay | Admitting: Cardiology

## 2015-05-03 ENCOUNTER — Ambulatory Visit (INDEPENDENT_AMBULATORY_CARE_PROVIDER_SITE_OTHER): Payer: Medicare Other | Admitting: Cardiology

## 2015-05-03 VITALS — BP 128/72 | HR 60 | Ht 66.0 in | Wt 215.0 lb

## 2015-05-03 DIAGNOSIS — I10 Essential (primary) hypertension: Secondary | ICD-10-CM | POA: Diagnosis not present

## 2015-05-03 DIAGNOSIS — E669 Obesity, unspecified: Secondary | ICD-10-CM

## 2015-05-03 DIAGNOSIS — Z87891 Personal history of nicotine dependence: Secondary | ICD-10-CM

## 2015-05-03 DIAGNOSIS — I25709 Atherosclerosis of coronary artery bypass graft(s), unspecified, with unspecified angina pectoris: Secondary | ICD-10-CM

## 2015-05-03 MED ORDER — ATORVASTATIN CALCIUM 40 MG PO TABS: 40.0000 mg | ORAL_TABLET | Freq: Every day | ORAL | Status: DC

## 2015-05-03 NOTE — Progress Notes (Signed)
Cardiology Office Note    Date:  05/03/2015   ID:  Hunter [name], DOB 24-Mar-1950, MRN [id number]  PCP:  [name], MD  Cardiologist:   [name], MD     History of Present Illness:  Hunter [name] is a 66 y.o. male former patient of Dr. [name] with coronary artery disease status post CABG with most recent heart catheterization in 2012 resulting in continued medical management, COPD, diabetes, peripheral vascular disease, Dr. [name] here for follow-up.  Back in 2005 he had a myocardial infarction with stent placement. CABG was also in 2005. Cardiac catheterization by Dr. [name] 09/12/10 showed normal then trigger function and all grafts were patent. Occasionally he will have anginal symptoms but rarely takes nitroglycerin.  He quit smoking a few years ago. Wheelchair bound. No regular exercise.  In April 2016 had increasing symptoms of angina and dyspnea. Echo at that time shouldn't ejection fraction of 50-55% with grade 2 diastolic dysfunction. Myoview stress test on 04/27/14 showed no ischemia, EF 50%. Weight gain. Nitroglycerin does not seem to help. Isosorbide was administered but unable to take because of severe headache.  Worsening peripheral edema likely related to wheelchair status, obesity. Support hose helps.  Needs left shoulder surgery. Foot drop. Several spine surgery. This is why he is in a wheelchair. He has not had any exertional anginal symptoms. Tobacco cessation. Recent stress test as above low risk.   Past Medical History  Diagnosis Date  . CAD (coronary artery disease)     a. Remote MI with stent in 2005;  b. CABG x 5 in 2005;  c. 09/2010 Cath: LM nl, LAD 70p, 90-51m, D1 80p, LCX 41m, OM1 100, OM2 90ost, OM3 80, RCA 44m, VG->PDA->RPL nl, VG->Diag nl, VG->OM1 nl, LIMA->LAD nl, EF 60%-->Med Rx.  [name] COPD (chronic obstructive pulmonary disease) ([place])   . Diabetes mellitus   . PAD (peripheral artery disease) ([place])     followed by Dr. [name]  .  Hypertension   . Tobacco dependence in remission     a. quit 2013.  . Increased liver enzymes     due to alcohol use  . Arthritis   . Occlusive disease, arterial ([place])     lower extremity  . H/O spinal cord injury     a. more or less wheelchair bound.  . Nocturia   . Cellulitis of left lower extremity     Past Surgical History  Procedure Laterality Date  . Coronary artery bypass graft  2005    LIMA to LAD, SVG to RCA & PD, SVG to OM 1, SVG to 1st DX; Dr. [name]  . Epispadius correction    . Femur surgery      RIGHT  . Back surgery      FUSIONS  . Spinal tumor    . Laminectomy    . Hand surgery      Left   . Eye surgery      BILATERAL TEAR DUCT  . Eye surgery Right Sept. 2014    Cataract    Current Medications: Outpatient Prescriptions Prior to Visit  Medication Sig Dispense Refill  . amLODipine (NORVASC) 5 MG tablet Take 1 tablet (5 mg total) by mouth daily. 30 tablet 6  . aspirin 81 MG tablet Take 81 mg by mouth daily.      . Canagliflozin (INVOKANA) 100 MG TABS Take 100 mg by mouth daily.     . cetirizine (ZYRTEC) 10 MG tablet Take 10 mg by mouth daily.    [name]  chlorpheniramine (CHLOR-TABLETS) 4 MG tablet Take 4 mg by mouth daily.      . clopidogrel (PLAVIX) 75 MG tablet TAKE 1 TABLET BY MOUTH DAILY 30 tablet 5  . Docusate Calcium (STOOL SOFTENER PO) Take 100 mg by mouth daily.     [name] doxazosin (CARDURA) 2 MG tablet Take 2 mg by mouth daily.      [name] gabapentin (NEURONTIN) 300 MG capsule Take 300 mg by mouth 2 (two) times daily.     [name] HUMALOG KWIKPEN 100 UNIT/ML SOPN Inject 16 Units into the skin 3 (three) times daily with meals.     [name] ipratropium (ATROVENT) 0.06 % nasal spray Place 2 sprays into both nostrils 4 (four) times daily as needed for rhinitis.    [name] LEVEMIR FLEXPEN 100 UNIT/ML SOPN Inject 30 Units into the skin daily.     . metoprolol (LOPRESSOR) 50 MG tablet TAKE 1 TABLET (50 MG TOTAL) BY MOUTH 2 (TWO) TIMES DAILY. 60 tablet 5  . mometasone (NASONEX) 50  MCG/ACT nasal spray Place 2 sprays into the nose daily as needed (for nasal congestion).     . nitroGLYCERIN (NITROSTAT) 0.4 MG SL tablet Place 0.4 mg under the tongue every 5 (five) minutes as needed for chest pain (chest pain).    . NOVOFINE 32G X 6 MM MISC     . omeprazole (PRILOSEC) 40 MG capsule Take 40 mg by mouth 2 (two) times daily.    . ONE TOUCH ULTRA TEST test strip 1 each daily.     [name] DELICA LANCETS MISC daily.    [name] oxybutynin (DITROPAN XL) 15 MG 24 hr tablet Take 15 mg by mouth daily.      [name] triamterene-hydrochlorothiazide (MAXZIDE-25) 37.5-25 MG per tablet Take 1 tablet by mouth daily.     [name] atorvastatin (LIPITOR) 10 MG tablet Take 5 tablets by mouth 2 (two) times daily.     . isosorbide mononitrate (IMDUR) 30 MG 24 hr tablet Take 1 tablet (30 mg total) by mouth daily. 30 tablet 6  . ZETIA 10 MG tablet TAKE 1 TABLET (10 MG TOTAL) BY MOUTH DAILY. 30 tablet 1  . oxyCODONE-acetaminophen (PERCOCET/ROXICET) 5-325 MG per tablet Take 1-2 tablets by mouth every 4 (four) hours as needed for moderate pain or severe pain. (Patient not taking: Reported on 03/10/2015) 12 tablet 0   No facility-administered medications prior to visit.     Allergies:   Codeine; Lisinopril; Ramipril; and Tape   Social History   Social History  . Marital Status: Divorced    Spouse Name: N/A  . Number of Children: 2  . Years of Education: N/A   Occupational History  . disabled    Social History Main Topics  . Smoking status: Former Smoker -- 0.50 packs/day for 40 years    Types: Cigarettes    Quit date: 01/01/2009  . Smokeless tobacco: Never Used  . Alcohol Use: 0.0 oz/week    0 Standard drinks or equivalent per week     Comment: rare  . Drug Use: No  . Sexual Activity: Not Asked   Other Topics Concern  . None   Social History Narrative     Family History:  The patient's family history includes COPD in his sister; Cancer in his mother and sister; Coronary artery disease in his brother  and sister; Diabetes in his brother, mother, and sister; Heart attack in his brother, mother, and sister; Heart disease in his brother, father, mother, and sister; Other in his brother  and father.   ROS:   Please see the history of present illness.   Foot drop, back pain, leg weakness, denies syncope orthopnea PND ROS All other systems reviewed and are negative.   PHYSICAL EXAM:   VS:  BP 128/72 mmHg  Pulse 60  Ht 5\' 6"  (1.676 m)  Wt 215 lb (97.523 kg)  BMI 34.72 kg/m2   GEN: Well nourished, well developed, in no acute distressWheelchair HEENT: normal Neck: no JVD, carotid bruits, or masses Cardiac: RRR; no murmurs, rubs, or gallops,no edema CABG scar Respiratory:  clear to auscultation bilaterally, normal work of breathing GI: soft, nontender, nondistended, + BS MS: no deformity or atrophy Skin: warm and dry, no rash, left leg compression stocking, chronic edema Neuro:  Alert and Oriented x 3, Strength and sensation are intact Psych: euthymic mood, full affect  Wt Readings from Last 3 Encounters:  05/03/15 215 lb (97.523 kg)  03/10/15 212 lb (96.163 kg)  01/20/15 212 lb (96.163 kg)      Studies/Labs Reviewed:   EKG:  EKG is not ordered today.  EKG 01/20/15-sinus rhythm, T-wave inversion inferior lateral personally viewed  Recent Labs: No results found for requested labs within last 365 days.   Lipid Panel    Component Value Date/Time   CHOL 105 03/31/2010 0910   TRIG 97.0 03/31/2010 0910   HDL 26.00* 03/31/2010 0910   CHOLHDL 4 03/31/2010 0910   VLDL 19.4 03/31/2010 0910   LDLCALC 60 03/31/2010 0910    Additional studies/ records that were reviewed today include:   Echocardiogram 04/27/14:  - Left ventricle: The cavity size was normal. Wall thickness was  increased in a pattern of mild LVH. Systolic function was normal.  The estimated ejection fraction was in the range of 50% to 55%.  Wall motion was normal; there were no regional wall motion   abnormalities. Features are consistent with a pseudonormal left  ventricular filling pattern, with concomitant abnormal relaxation  and increased filling pressure (grade 2 diastolic dysfunction). - Pulmonary arteries: PA peak pressure: 33 mm Hg (S).  Impressions:  - Low normal LV function; grade 2 diastolic dysfunction; trace MR  and TR.  Nuclear stress test 04/28/14: The nuclear stress test was normal. No ischemia. Ejection fraction 50%  ASSESSMENT:    1. Coronary artery disease involving coronary bypass graft of native heart with unspecified angina pectoris   2. Essential hypertension   3. Obesity   4. Former smoker      PLAN:  In order of problems listed above:  Preoperative risk assessment prior to left shoulder surgery -He may proceed with low to moderate cardiovascular risk secondary to his prior bypass. He is recently completed a nuclear stress test that was low risk with no ischemia. -He may hold his Plavix and aspirin for 5 days prior to procedure. Resume following.  Coronary artery disease status post bypass -Occasional chest discomfort. He has not tolerated isosorbide secondary to headaches. Very rarely will ever use nitroglycerin. Overall good medical management. He is unable to exert himself because of wheelchair status for the most part.  Obesity -Weight loss, decrease carbohydrates.  Essential hypertension -Continue with current medications excellent control  Hyperlipidemia -I will go ahead and discontinue Zetia. -We will increase his atorvastatin from 10 mg to 40 mg. He has not had a problem in the past tolerating this he states. -Last LDL less than 70.  Former smoker -Encouraged continued tobacco cessation, no issue      Medication Adjustments/Labs and Tests  Ordered: Current medicines are reviewed at length with the patient today.  Concerns regarding medicines are outlined above.  Medication changes, Labs and Tests ordered today are listed in  the Patient Instructions below. Patient Instructions  Medication Instructions:  Please stop Isosorbide and Zetia.  Increase Atorvastatin to 40 mg a day. Continue all other medications as listed.  Follow-Up: Follow up in 1 year with Dr. [name].  You will receive a letter in the mail 2 months before you are due.  Please call us when you receive this letter to schedule your follow up appointment.  If you need a refill on your cardiac medications before your next appointment, please call your pharmacy.  Thank you for choosing [facility]!!          Signed, [name], MD  05/03/2015 8:53 AM    [facility] [place], [place], [place]  [address] Phone: [phone]; Fax: [phone]

## 2015-05-03 NOTE — Patient Instructions (Signed)
Medication Instructions:  Please stop Isosorbide and Zetia.  Increase Atorvastatin to 40 mg a day. Continue all other medications as listed.  Follow-Up: Follow up in 1 year with Dr. [name].  You will receive a letter in the mail 2 months before you are due.  Please call us when you receive this letter to schedule your follow up appointment.  If you need a refill on your cardiac medications before your next appointment, please call your pharmacy.  Thank you for choosing [facility]!!

## 2015-05-04 ENCOUNTER — Other Ambulatory Visit: Payer: Self-pay

## 2015-05-04 MED ORDER — METOPROLOL TARTRATE 50 MG PO TABS: ORAL_TABLET | ORAL | Status: DC

## 2015-05-04 NOTE — Telephone Encounter (Signed)
Jerline Pain, MD at 05/03/2015 8:32 AM  metoprolol (LOPRESSOR) 50 MG tabletTAKE 1 TABLET (50 MG TOTAL) BY MOUTH 2 (TWO) TIMES DAILY Medication Adjustments/Labs and Tests Ordered: Current medicines are reviewed at length with the patient today. Concerns regarding medicines are outlined above. Medication changes, Labs and Tests ordered today are listed in the Patient Instructions below. Patient Instructions  Medication Instructions:  Please stop Isosorbide and Zetia. Increase Atorvastatin to 40 mg a day. Continue all other medications as listed.

## 2015-05-11 ENCOUNTER — Other Ambulatory Visit: Payer: Self-pay | Admitting: Orthopedic Surgery

## 2015-05-20 ENCOUNTER — Other Ambulatory Visit: Payer: Self-pay | Admitting: Orthopedic Surgery

## 2015-05-31 ENCOUNTER — Telehealth: Payer: Self-pay | Admitting: *Deleted

## 2015-05-31 ENCOUNTER — Other Ambulatory Visit: Payer: Self-pay | Admitting: *Deleted

## 2015-05-31 DIAGNOSIS — K625 Hemorrhage of anus and rectum: Secondary | ICD-10-CM | POA: Insufficient documentation

## 2015-05-31 MED ORDER — CLOPIDOGREL BISULFATE 75 MG PO TABS: 75.0000 mg | ORAL_TABLET | Freq: Every day | ORAL | Status: DC

## 2015-05-31 NOTE — Telephone Encounter (Signed)
[facility] pharmacy called and stated that they will not refill the clopidogrel until they are sure that Dr [name] is aware that the patient is also on omeprazole 40mg  bid from Dr Philip Aspen. Please advise. Thanks, MI

## 2015-05-31 NOTE — Telephone Encounter (Signed)
Spoke with pharmacist at [facility] and made her aware of note below.

## 2015-05-31 NOTE — Telephone Encounter (Signed)
Both Dr [name] and Dr [name] are and have been aware pt is on both of these medications and has been for many years now.  If they feel one should be changed or d/ced they should contact Dr Philip Aspen as pt will need to continue Plavix.

## 2015-06-01 ENCOUNTER — Encounter: Payer: Self-pay | Admitting: Gastroenterology

## 2015-06-09 ENCOUNTER — Encounter (HOSPITAL_BASED_OUTPATIENT_CLINIC_OR_DEPARTMENT_OTHER): Payer: Self-pay | Admitting: *Deleted

## 2015-06-09 NOTE — Progress Notes (Addendum)
NPO AFTER MN WITH EXCEPTION CLEAR LIQUIDS UNTIL 0700 (NO CREAM [place] PRODUCTS).  ARRIVE AT 1100.  NEEDS ISTAT 8.  CURRENT EKG IN CHART AND EPIC . ALSO CARDIAC CLEARANCE W/ CHART.  WILL TAKE PRILOSEC, METOPROLOL, AND GABAPENTIN AM DOS W/ SIPS OF WATER AND IF NEEDED TAKE TRAMADOL.  WILL BRING CPAP. PT IS WHEELCHAIR BOUND BUT CAN TRANSFER SELF.  REVIEWED CHART W/ DR Lyndle Herrlich MDA,  OK TO PROCEED.

## 2015-06-13 ENCOUNTER — Telehealth: Payer: Self-pay | Admitting: Cardiology

## 2015-06-13 NOTE — Telephone Encounter (Signed)
Received faxed hardcopy of this request and took it to Dr [name].  He reviewed and gave written approval for pt to hold Plavix and ASA X 7 days.  Took paperwork to MR to be faxed.

## 2015-06-13 NOTE — Telephone Encounter (Signed)
New Message:  Pt surgery had to be postponed for 2 days.  Request for surgical clearance:  1. What type of surgery is being performed? Left Shoulder Arthroscopy   2. When is this surgery scheduled? 06-16-15   3. Are there any medications that need to be held prior to surgery and how long?He needs to hold his Plavix and Aspirin for 2 more days- had 5 days cleared,now needs 2 more days   4. Name of physician performing surgery? Dr R. Hart Robinsons   5. What is your office phone and fax number? [phone] and fax number is [phone]  6.

## 2015-06-13 NOTE — Telephone Encounter (Signed)
Will verify with Dr [name] that it is ok for pt to hold ASA and Plavix for a total of 7 days for surgery.

## 2015-06-13 NOTE — Progress Notes (Signed)
Patient aware of change of day of procedure.To [facility] 06/16/15-at 1100-prior instructions reviewed,stated understands.Istat 8 on arrival.

## 2015-06-14 ENCOUNTER — Telehealth: Payer: Self-pay | Admitting: Cardiology

## 2015-06-14 NOTE — Telephone Encounter (Signed)
Clearance refax to # listed.

## 2015-06-14 NOTE — Telephone Encounter (Signed)
Spoke with Len Blalock to let him know the paperwork was signed and completed yesterday.  It was taken to MR to be faxed and I will have it refaxed.  He is aware Dr [name] saids OK to hold X 7 days and restart after.

## 2015-06-14 NOTE — Telephone Encounter (Signed)
New message    The pt is going to operated on tomorrow and the PA Byrson needs to speak with nurse and the pt's blood thinner, the pt has been off his blood thinner for 7 days since the surgery is tomorrow it that ok or does the pt need a shot of Lovenox or the orthopedic doctor need to start the blood thinner back post-op.    The Pa is asking to speak with the nurse today the PA states the office has faxed over the paper work a couple of times and no-one has gotten back in touch with them. This is for Dr. Harrietta Guardian office the pt is having shoulder surgery tomorrow.

## 2015-06-16 ENCOUNTER — Observation Stay (HOSPITAL_BASED_OUTPATIENT_CLINIC_OR_DEPARTMENT_OTHER)
Admission: RE | Admit: 2015-06-16 | Discharge: 2015-06-17 | Disposition: A | Discharge status: Home / Self Care | Payer: Medicare Other | Source: Ambulatory Visit | Attending: Specialist | Admitting: Specialist

## 2015-06-16 ENCOUNTER — Encounter (HOSPITAL_BASED_OUTPATIENT_CLINIC_OR_DEPARTMENT_OTHER): Payer: Self-pay | Admitting: *Deleted

## 2015-06-16 ENCOUNTER — Ambulatory Visit (HOSPITAL_BASED_OUTPATIENT_CLINIC_OR_DEPARTMENT_OTHER): Payer: Medicare Other | Admitting: Anesthesiology

## 2015-06-16 ENCOUNTER — Encounter (HOSPITAL_COMMUNITY)
Admission: RE | Disposition: A | Discharge status: Home / Self Care | Payer: Self-pay | Source: Ambulatory Visit | Attending: Specialist

## 2015-06-16 DIAGNOSIS — I252 Old myocardial infarction: Secondary | ICD-10-CM | POA: Diagnosis not present

## 2015-06-16 DIAGNOSIS — Z789 Other specified health status: Secondary | ICD-10-CM

## 2015-06-16 DIAGNOSIS — I2510 Atherosclerotic heart disease of native coronary artery without angina pectoris: Secondary | ICD-10-CM | POA: Diagnosis not present

## 2015-06-16 DIAGNOSIS — Z87891 Personal history of nicotine dependence: Secondary | ICD-10-CM | POA: Insufficient documentation

## 2015-06-16 DIAGNOSIS — K219 Gastro-esophageal reflux disease without esophagitis: Secondary | ICD-10-CM | POA: Diagnosis not present

## 2015-06-16 DIAGNOSIS — Z79899 Other long term (current) drug therapy: Secondary | ICD-10-CM | POA: Insufficient documentation

## 2015-06-16 DIAGNOSIS — I10 Essential (primary) hypertension: Secondary | ICD-10-CM | POA: Diagnosis not present

## 2015-06-16 DIAGNOSIS — Z9889 Other specified postprocedural states: Secondary | ICD-10-CM

## 2015-06-16 DIAGNOSIS — M19012 Primary osteoarthritis, left shoulder: Secondary | ICD-10-CM | POA: Insufficient documentation

## 2015-06-16 DIAGNOSIS — J449 Chronic obstructive pulmonary disease, unspecified: Secondary | ICD-10-CM | POA: Diagnosis not present

## 2015-06-16 DIAGNOSIS — E1151 Type 2 diabetes mellitus with diabetic peripheral angiopathy without gangrene: Secondary | ICD-10-CM | POA: Insufficient documentation

## 2015-06-16 DIAGNOSIS — Z9989 Dependence on other enabling machines and devices: Secondary | ICD-10-CM | POA: Insufficient documentation

## 2015-06-16 DIAGNOSIS — Z951 Presence of aortocoronary bypass graft: Secondary | ICD-10-CM | POA: Diagnosis not present

## 2015-06-16 DIAGNOSIS — Z794 Long term (current) use of insulin: Secondary | ICD-10-CM | POA: Insufficient documentation

## 2015-06-16 DIAGNOSIS — G4733 Obstructive sleep apnea (adult) (pediatric): Secondary | ICD-10-CM | POA: Diagnosis not present

## 2015-06-16 DIAGNOSIS — Z955 Presence of coronary angioplasty implant and graft: Secondary | ICD-10-CM | POA: Diagnosis not present

## 2015-06-16 DIAGNOSIS — M25512 Pain in left shoulder: Secondary | ICD-10-CM | POA: Diagnosis present

## 2015-06-16 DIAGNOSIS — Z7982 Long term (current) use of aspirin: Secondary | ICD-10-CM | POA: Insufficient documentation

## 2015-06-16 DIAGNOSIS — Z993 Dependence on wheelchair: Secondary | ICD-10-CM | POA: Insufficient documentation

## 2015-06-16 DIAGNOSIS — I872 Venous insufficiency (chronic) (peripheral): Secondary | ICD-10-CM | POA: Diagnosis not present

## 2015-06-16 DIAGNOSIS — M75122 Complete rotator cuff tear or rupture of left shoulder, not specified as traumatic: Secondary | ICD-10-CM | POA: Diagnosis not present

## 2015-06-16 HISTORY — DX: Other specified health status: Z78.9

## 2015-06-16 HISTORY — DX: Disorder of arteries and arterioles, unspecified: I77.9

## 2015-06-16 HISTORY — DX: Dependence on other enabling machines and devices: Z99.89

## 2015-06-16 HISTORY — DX: Old myocardial infarction: I25.2

## 2015-06-16 HISTORY — DX: Overactive bladder: N32.81

## 2015-06-16 HISTORY — DX: Foot drop, left foot: M21.372

## 2015-06-16 HISTORY — DX: Foot drop, right foot: M21.371

## 2015-06-16 HISTORY — DX: Male erectile dysfunction, unspecified: N52.9

## 2015-06-16 HISTORY — DX: Localized edema: R60.0

## 2015-06-16 HISTORY — PX: SHOULDER ARTHROSCOPY WITH ROTATOR CUFF REPAIR AND SUBACROMIAL DECOMPRESSION: SHX5686

## 2015-06-16 HISTORY — DX: Obstructive sleep apnea (adult) (pediatric): G47.33

## 2015-06-16 HISTORY — DX: Urge incontinence: N39.41

## 2015-06-16 HISTORY — DX: Type 2 diabetes mellitus without complications: E11.9

## 2015-06-16 HISTORY — DX: Venous insufficiency (chronic) (peripheral): I87.2

## 2015-06-16 HISTORY — DX: Presence of aortocoronary bypass graft: Z95.1

## 2015-06-16 HISTORY — DX: Personal history of other benign neoplasm: Z86.018

## 2015-06-16 HISTORY — DX: Personal history of diseases of the skin and subcutaneous tissue: Z87.2

## 2015-06-16 HISTORY — DX: Unspecified osteoarthritis, unspecified site: M19.90

## 2015-06-16 HISTORY — DX: Dependence on wheelchair: Z99.3

## 2015-06-16 HISTORY — DX: Gastro-esophageal reflux disease without esophagitis: K21.9

## 2015-06-16 HISTORY — DX: Unspecified rotator cuff tear or rupture of left shoulder, not specified as traumatic: M75.102

## 2015-06-16 LAB — POCT I-STAT, CHEM 8
BUN: 18 mg/dL (ref 6–20)
CALCIUM ION: 1.21 mmol/L (ref 1.13–1.30)
CREATININE: 0.9 mg/dL (ref 0.61–1.24)
Chloride: 103 mmol/L (ref 101–111)
GLUCOSE: 119 mg/dL — AB (ref 65–99)
HCT: 47 % (ref 39.0–52.0)
HEMOGLOBIN: 16 g/dL (ref 13.0–17.0)
Potassium: 4.2 mmol/L (ref 3.5–5.1)
Sodium: 142 mmol/L (ref 135–145)
TCO2: 29 mmol/L (ref 0–100)

## 2015-06-16 LAB — GLUCOSE, CAPILLARY
GLUCOSE-CAPILLARY: 125 mg/dL — AB (ref 65–99)
GLUCOSE-CAPILLARY: 121 mg/dL — AB (ref 65–99)

## 2015-06-16 SURGERY — SHOULDER ARTHROSCOPY WITH ROTATOR CUFF REPAIR AND SUBACROMIAL DECOMPRESSION
Anesthesia: Regional | Site: Shoulder | Laterality: Left

## 2015-06-16 MED ORDER — ONDANSETRON HCL 4 MG/2ML IJ SOLN
INTRAMUSCULAR | Status: AC
  Filled 2015-06-16: qty 2

## 2015-06-16 MED ORDER — SODIUM CHLORIDE 0.9 % IJ SOLN
INTRAMUSCULAR | Status: DC | PRN
  Administered 2015-06-16: 13:00:00

## 2015-06-16 MED ORDER — NITROGLYCERIN 0.4 MG SL SUBL
0.4000 mg | SUBLINGUAL_TABLET | SUBLINGUAL | Status: DC | PRN
  Filled 2015-06-16: qty 25

## 2015-06-16 MED ORDER — OXYCODONE HCL 5 MG PO TABS: 5.0000 mg | ORAL_TABLET | ORAL | Status: DC | PRN

## 2015-06-16 MED ORDER — ACETAMINOPHEN 325 MG PO TABS: 650.0000 mg | ORAL_TABLET | Freq: Four times a day (QID) | ORAL | Status: DC | PRN

## 2015-06-16 MED ORDER — FENTANYL CITRATE (PF) 100 MCG/2ML IJ SOLN
INTRAMUSCULAR | Status: AC
  Filled 2015-06-16: qty 2

## 2015-06-16 MED ORDER — GABAPENTIN 300 MG PO CAPS
300.0000 mg | ORAL_CAPSULE | Freq: Two times a day (BID) | ORAL | Status: DC
  Administered 2015-06-16 – 2015-06-17 (×2): 300 mg via ORAL
  Filled 2015-06-16 (×4): qty 1

## 2015-06-16 MED ORDER — PANTOPRAZOLE SODIUM 40 MG PO TBEC
40.0000 mg | DELAYED_RELEASE_TABLET | Freq: Every day | ORAL | Status: DC
  Administered 2015-06-16 – 2015-06-17 (×2): 40 mg via ORAL
  Filled 2015-06-16 (×4): qty 1

## 2015-06-16 MED ORDER — MIDAZOLAM HCL 2 MG/2ML IJ SOLN
INTRAMUSCULAR | Status: AC
  Filled 2015-06-16: qty 2

## 2015-06-16 MED ORDER — LIDOCAINE HCL (CARDIAC) 20 MG/ML IV SOLN
INTRAVENOUS | Status: DC | PRN
  Administered 2015-06-16: 60 mg via INTRAVENOUS

## 2015-06-16 MED ORDER — POVIDONE-IODINE 7.5 % EX SOLN
Freq: Once | CUTANEOUS | Status: DC
  Filled 2015-06-16: qty 118

## 2015-06-16 MED ORDER — ACETAMINOPHEN 650 MG RE SUPP: 650.0000 mg | Freq: Four times a day (QID) | RECTAL | Status: DC | PRN

## 2015-06-16 MED ORDER — BUPIVACAINE HCL (PF) 0.25 % IJ SOLN
INTRAMUSCULAR | Status: DC | PRN
  Administered 2015-06-16: 20 mL

## 2015-06-16 MED ORDER — FLUTICASONE PROPIONATE 50 MCG/ACT NA SUSP
2.0000 | Freq: Every day | NASAL | Status: DC
  Filled 2015-06-16 (×2): qty 16

## 2015-06-16 MED ORDER — FENTANYL CITRATE (PF) 100 MCG/2ML IJ SOLN
INTRAMUSCULAR | Status: DC | PRN
  Administered 2015-06-16 (×3): 25 ug via INTRAVENOUS
  Administered 2015-06-16: 50 ug via INTRAVENOUS
  Administered 2015-06-16: 25 ug via INTRAVENOUS

## 2015-06-16 MED ORDER — INSULIN ASPART 100 UNIT/ML ~~LOC~~ SOLN
0.0000 [IU] | SUBCUTANEOUS | Status: DC
  Administered 2015-06-16 – 2015-06-17 (×3): 2 [IU] via SUBCUTANEOUS
  Filled 2015-06-16: qty 0.15

## 2015-06-16 MED ORDER — ONDANSETRON HCL 4 MG/2ML IJ SOLN
4.0000 mg | Freq: Four times a day (QID) | INTRAMUSCULAR | Status: DC | PRN
  Administered 2015-06-16: 4 mg via INTRAVENOUS
  Filled 2015-06-16: qty 2

## 2015-06-16 MED ORDER — PROPOFOL 10 MG/ML IV BOLUS
INTRAVENOUS | Status: AC
  Filled 2015-06-16: qty 20

## 2015-06-16 MED ORDER — CANAGLIFLOZIN 100 MG PO TABS
100.0000 mg | ORAL_TABLET | Freq: Every day | ORAL | Status: DC
  Administered 2015-06-17: 100 mg via ORAL
  Filled 2015-06-16 (×3): qty 1

## 2015-06-16 MED ORDER — OXYBUTYNIN CHLORIDE ER 15 MG PO TB24
15.0000 mg | ORAL_TABLET | Freq: Every day | ORAL | Status: DC
  Administered 2015-06-16 – 2015-06-17 (×2): 15 mg via ORAL
  Filled 2015-06-16 (×4): qty 1

## 2015-06-16 MED ORDER — EPHEDRINE SULFATE 50 MG/ML IJ SOLN
INTRAMUSCULAR | Status: DC | PRN
  Administered 2015-06-16 (×2): 10 mg via INTRAVENOUS
  Administered 2015-06-16: 5 mg via INTRAVENOUS

## 2015-06-16 MED ORDER — METHOCARBAMOL 500 MG PO TABS: 500.0000 mg | ORAL_TABLET | Freq: Four times a day (QID) | ORAL | Status: DC | PRN

## 2015-06-16 MED ORDER — ONDANSETRON HCL 4 MG/2ML IJ SOLN
INTRAMUSCULAR | Status: DC | PRN
Start: 2015-06-16 — End: 2015-06-16
  Administered 2015-06-16: 4 mg via INTRAVENOUS

## 2015-06-16 MED ORDER — TRAMADOL HCL 50 MG PO TABS
50.0000 mg | ORAL_TABLET | Freq: Four times a day (QID) | ORAL | Status: DC | PRN
  Filled 2015-06-16: qty 1

## 2015-06-16 MED ORDER — DIPHENHYDRAMINE HCL 25 MG PO TABS
25.0000 mg | ORAL_TABLET | Freq: Four times a day (QID) | ORAL | Status: DC
  Administered 2015-06-16 – 2015-06-17 (×2): 25 mg via ORAL
  Filled 2015-06-16 (×10): qty 1

## 2015-06-16 MED ORDER — BUPIVACAINE-EPINEPHRINE (PF) 0.5% -1:200000 IJ SOLN
INTRAMUSCULAR | Status: DC | PRN
  Administered 2015-06-16: 30 mL via PERINEURAL

## 2015-06-16 MED ORDER — ONDANSETRON HCL 4 MG PO TABS: 4.0000 mg | ORAL_TABLET | Freq: Four times a day (QID) | ORAL | Status: DC | PRN

## 2015-06-16 MED ORDER — SODIUM CHLORIDE 0.9 % IJ SOLN
INTRAMUSCULAR | Status: DC | PRN
  Administered 2015-06-16: 20 mL

## 2015-06-16 MED ORDER — METHOCARBAMOL 1000 MG/10ML IJ SOLN
500.0000 mg | Freq: Four times a day (QID) | INTRAVENOUS | Status: DC | PRN
  Filled 2015-06-16: qty 5

## 2015-06-16 MED ORDER — ASPIRIN EC 81 MG PO TBEC
81.0000 mg | DELAYED_RELEASE_TABLET | Freq: Every day | ORAL | Status: DC
  Administered 2015-06-17: 81 mg via ORAL
  Filled 2015-06-16 (×2): qty 1

## 2015-06-16 MED ORDER — DOXAZOSIN MESYLATE 2 MG PO TABS
2.0000 mg | ORAL_TABLET | Freq: Every day | ORAL | Status: DC
  Administered 2015-06-16 – 2015-06-17 (×2): 2 mg via ORAL
  Filled 2015-06-16 (×4): qty 1

## 2015-06-16 MED ORDER — METOCLOPRAMIDE HCL 10 MG PO TABS: 5.0000 mg | ORAL_TABLET | Freq: Three times a day (TID) | ORAL | Status: DC | PRN

## 2015-06-16 MED ORDER — DOCUSATE SODIUM 100 MG PO CAPS
100.0000 mg | ORAL_CAPSULE | Freq: Two times a day (BID) | ORAL | Status: DC
  Administered 2015-06-16 – 2015-06-17 (×2): 100 mg via ORAL
  Filled 2015-06-16 (×2): qty 1

## 2015-06-16 MED ORDER — CEFAZOLIN SODIUM-DEXTROSE 2-4 GM/100ML-% IV SOLN
2.0000 g | INTRAVENOUS | Status: DC
  Filled 2015-06-16: qty 100

## 2015-06-16 MED ORDER — FENTANYL CITRATE (PF) 100 MCG/2ML IJ SOLN
100.0000 ug | Freq: Once | INTRAMUSCULAR | Status: AC
  Administered 2015-06-16: 50 ug via INTRAVENOUS
  Filled 2015-06-16: qty 2

## 2015-06-16 MED ORDER — FENTANYL CITRATE (PF) 100 MCG/2ML IJ SOLN
INTRAMUSCULAR | Status: AC
Start: 2015-06-16 — End: 2015-06-16
  Filled 2015-06-16: qty 2

## 2015-06-16 MED ORDER — PHENOL 1.4 % MT LIQD
1.0000 | OROMUCOSAL | Status: DC | PRN
  Filled 2015-06-16: qty 177

## 2015-06-16 MED ORDER — CEFAZOLIN SODIUM-DEXTROSE 2-4 GM/100ML-% IV SOLN
INTRAVENOUS | Status: AC
  Filled 2015-06-16: qty 100

## 2015-06-16 MED ORDER — SODIUM CHLORIDE 0.9 % IR SOLN
Status: DC | PRN
  Administered 2015-06-16: 12000 mL
  Administered 2015-06-16 (×2): 3000 mL

## 2015-06-16 MED ORDER — CLOPIDOGREL BISULFATE 75 MG PO TABS
75.0000 mg | ORAL_TABLET | Freq: Every day | ORAL | Status: DC
  Administered 2015-06-17: 75 mg via ORAL
  Filled 2015-06-16 (×2): qty 1

## 2015-06-16 MED ORDER — METOPROLOL TARTRATE 50 MG PO TABS
50.0000 mg | ORAL_TABLET | Freq: Two times a day (BID) | ORAL | Status: DC
  Administered 2015-06-16 – 2015-06-17 (×2): 50 mg via ORAL
  Filled 2015-06-16 (×3): qty 1

## 2015-06-16 MED ORDER — PROPOFOL 10 MG/ML IV BOLUS
INTRAVENOUS | Status: DC | PRN
  Administered 2015-06-16: 180 mg via INTRAVENOUS

## 2015-06-16 MED ORDER — BUPIVACAINE-EPINEPHRINE (PF) 0.5% -1:200000 IJ SOLN
INTRAMUSCULAR | Status: AC
  Filled 2015-06-16: qty 30

## 2015-06-16 MED ORDER — SODIUM CHLORIDE 0.9 % IV SOLN
INTRAVENOUS | Status: DC
  Administered 2015-06-16: 19:00:00 via INTRAVENOUS

## 2015-06-16 MED ORDER — ALUM & MAG HYDROXIDE-SIMETH 200-200-20 MG/5ML PO SUSP: 30.0000 mL | ORAL | Status: DC | PRN

## 2015-06-16 MED ORDER — AMLODIPINE BESYLATE 5 MG PO TABS
5.0000 mg | ORAL_TABLET | Freq: Every day | ORAL | Status: DC
  Administered 2015-06-16 – 2015-06-17 (×2): 5 mg via ORAL
  Filled 2015-06-16 (×4): qty 1

## 2015-06-16 MED ORDER — GLYCOPYRROLATE 0.2 MG/ML IJ SOLN
INTRAMUSCULAR | Status: DC | PRN
  Administered 2015-06-16: 0.2 mg via INTRAVENOUS

## 2015-06-16 MED ORDER — POLYETHYLENE GLYCOL 3350 17 G PO PACK: 17.0000 g | PACK | Freq: Every day | ORAL | Status: DC | PRN

## 2015-06-16 MED ORDER — MENTHOL 3 MG MT LOZG
1.0000 | LOZENGE | OROMUCOSAL | Status: DC | PRN
  Filled 2015-06-16: qty 9

## 2015-06-16 MED ORDER — ZOLPIDEM TARTRATE 5 MG PO TABS: 5.0000 mg | ORAL_TABLET | Freq: Every evening | ORAL | Status: DC | PRN

## 2015-06-16 MED ORDER — CEFAZOLIN SODIUM-DEXTROSE 2-4 GM/100ML-% IV SOLN
2.0000 g | INTRAVENOUS | Status: AC
  Administered 2015-06-16: 2 g via INTRAVENOUS
  Filled 2015-06-16: qty 100

## 2015-06-16 MED ORDER — IPRATROPIUM BROMIDE 0.06 % NA SOLN
2.0000 | Freq: Four times a day (QID) | NASAL | Status: DC | PRN
  Filled 2015-06-16: qty 15

## 2015-06-16 MED ORDER — MIDAZOLAM HCL 2 MG/2ML IJ SOLN
2.0000 mg | Freq: Once | INTRAMUSCULAR | Status: AC
  Administered 2015-06-16: 2 mg via INTRAVENOUS
  Filled 2015-06-16: qty 2

## 2015-06-16 MED ORDER — DIPHENHYDRAMINE HCL 12.5 MG/5ML PO ELIX: 12.5000 mg | ORAL_SOLUTION | ORAL | Status: DC | PRN

## 2015-06-16 MED ORDER — OXYCODONE-ACETAMINOPHEN 5-325 MG PO TABS: 1.0000 | ORAL_TABLET | ORAL | Status: DC | PRN

## 2015-06-16 MED ORDER — METOCLOPRAMIDE HCL 5 MG/ML IJ SOLN: 5.0000 mg | Freq: Three times a day (TID) | INTRAMUSCULAR | Status: DC | PRN

## 2015-06-16 MED ORDER — TRIAMTERENE-HCTZ 37.5-25 MG PO TABS
1.0000 | ORAL_TABLET | Freq: Every day | ORAL | Status: DC
  Administered 2015-06-16 – 2015-06-17 (×2): 1 via ORAL
  Filled 2015-06-16 (×4): qty 1

## 2015-06-16 MED ORDER — LIDOCAINE HCL (CARDIAC) 20 MG/ML IV SOLN
INTRAVENOUS | Status: AC
  Filled 2015-06-16: qty 5

## 2015-06-16 MED ORDER — DOCUSATE SODIUM 100 MG PO CAPS
100.0000 mg | ORAL_CAPSULE | Freq: Every day | ORAL | Status: DC
  Filled 2015-06-16: qty 1

## 2015-06-16 MED ORDER — ATORVASTATIN CALCIUM 40 MG PO TABS
40.0000 mg | ORAL_TABLET | Freq: Every day | ORAL | Status: DC
  Administered 2015-06-16: 40 mg via ORAL
  Filled 2015-06-16 (×3): qty 1

## 2015-06-16 MED ORDER — LACTATED RINGERS IV SOLN
INTRAVENOUS | Status: DC
  Administered 2015-06-16: 11:00:00 via INTRAVENOUS
  Filled 2015-06-16: qty 1000

## 2015-06-16 MED ORDER — SUCCINYLCHOLINE CHLORIDE 20 MG/ML IJ SOLN
INTRAMUSCULAR | Status: DC | PRN
  Administered 2015-06-16: 100 mg via INTRAVENOUS

## 2015-06-16 MED ORDER — CEFAZOLIN SODIUM-DEXTROSE 2-4 GM/100ML-% IV SOLN
2.0000 g | Freq: Four times a day (QID) | INTRAVENOUS | Status: AC
  Administered 2015-06-16 – 2015-06-17 (×3): 2 g via INTRAVENOUS
  Filled 2015-06-16 (×3): qty 100

## 2015-06-16 MED ORDER — HYDROMORPHONE HCL 1 MG/ML IJ SOLN: 1.0000 mg | INTRAMUSCULAR | Status: DC | PRN

## 2015-06-16 SURGICAL SUPPLY — 83 items
ANCH SUT SWLK 19.1 CLS EYLT TL (Anchor) ×2 IMPLANT
ANCHOR SUT BIO SW 4.75 W/FIB (Anchor) ×2 IMPLANT
BLADE CUDA GRT WHITE 3.5 (BLADE) ×2 IMPLANT
BLADE CUTTER GATOR 3.5 (BLADE) IMPLANT
BLADE GREAT WHITE 4.2 (BLADE) ×2 IMPLANT
BLADE SURG 11 STRL SS (BLADE) ×2 IMPLANT
BLADE SURG 15 STRL LF DISP TIS (BLADE) ×1 IMPLANT
BLADE SURG 15 STRL SS (BLADE) ×2
BNDG COHESIVE 4X5 TAN STRL (GAUZE/BANDAGES/DRESSINGS) ×1 IMPLANT
BUR 3.5 LG SPHERICAL (BURR) IMPLANT
BUR OVAL 6.0 (BURR) ×2 IMPLANT
BURR 3.5 LG SPHERICAL (BURR)
CANISTER SUCTION 2500CC (MISCELLANEOUS) IMPLANT
CANNULA 5.75X7 CRYSTAL CLEAR (CANNULA) ×2 IMPLANT
CANNULA 5.75X71 LONG (CANNULA) IMPLANT
CANNULA TWIST IN 8.25X7CM (CANNULA) ×5 IMPLANT
COVER BACK TABLE 60X90IN (DRAPES) ×2 IMPLANT
COVER MAYO STAND STRL (DRAPES) ×2 IMPLANT
DRAPE LG THREE QUARTER DISP (DRAPES) ×1 IMPLANT
DRAPE ORTHO SPLIT 77X108 STRL (DRAPES) ×4
DRAPE POUCH INSTRU U-SHP 10X18 (DRAPES) ×2 IMPLANT
DRAPE STERI 35X30 U-POUCH (DRAPES) ×2 IMPLANT
DRAPE SURG 17X23 STRL (DRAPES) ×2 IMPLANT
DRAPE SURG ORHT 6 SPLT 77X108 (DRAPES) ×2 IMPLANT
DRAPE U-SHAPE 47X51 STRL (DRAPES) ×2 IMPLANT
DURAPREP 26ML APPLICATOR (WOUND CARE) ×2 IMPLANT
ELECT MENISCUS 165MM 90D (ELECTRODE) IMPLANT
ELECT REM PT RETURN 9FT ADLT (ELECTROSURGICAL) ×2
ELECTRODE REM PT RTRN 9FT ADLT (ELECTROSURGICAL) ×1 IMPLANT
FIBERSTICK 2 (SUTURE) IMPLANT
GAUZE XEROFORM 1X8 LF (GAUZE/BANDAGES/DRESSINGS) ×2 IMPLANT
GLOVE BIO SURGEON STRL SZ7.5 (GLOVE) ×2 IMPLANT
GLOVE BIO SURGEON STRL SZ8 (GLOVE) ×4 IMPLANT
GLOVE INDICATOR 8.0 STRL GRN (GLOVE) ×2 IMPLANT
GOWN STRL REUS W/ TWL LRG LVL3 (GOWN DISPOSABLE) ×1 IMPLANT
GOWN STRL REUS W/ TWL XL LVL3 (GOWN DISPOSABLE) ×2 IMPLANT
GOWN STRL REUS W/TWL LRG LVL3 (GOWN DISPOSABLE) ×2
GOWN STRL REUS W/TWL XL LVL3 (GOWN DISPOSABLE) ×4
IV NS IRRIG 3000ML ARTHROMATIC (IV SOLUTION) ×6 IMPLANT
KIT ROOM TURNOVER WOR (KITS) ×2 IMPLANT
LASSO SUT 90 DEGREE (SUTURE) IMPLANT
MANIFOLD NEPTUNE II (INSTRUMENTS) ×1 IMPLANT
NDL 1/2 CIR CATGUT .05X1.09 (NEEDLE) IMPLANT
NDL SCORPION MULTI FIRE (NEEDLE) IMPLANT
NDL SPNL 18GX3.5 QUINCKE PK (NEEDLE) ×1 IMPLANT
NEEDLE 1/2 CIR CATGUT .05X1.09 (NEEDLE) IMPLANT
NEEDLE HYPO 22GX1.5 SAFETY (NEEDLE) ×3 IMPLANT
NEEDLE SCORPION MULTI FIRE (NEEDLE) ×2 IMPLANT
NEEDLE SPNL 18GX3.5 QUINCKE PK (NEEDLE) ×2 IMPLANT
NS IRRIG 500ML POUR BTL (IV SOLUTION) IMPLANT
PACK BASIN DAY SURGERY FS (CUSTOM PROCEDURE TRAY) ×2 IMPLANT
PAD ABD 8X10 STRL (GAUZE/BANDAGES/DRESSINGS) ×4 IMPLANT
PAD ARMBOARD 7.5X6 YLW CONV (MISCELLANEOUS) ×3 IMPLANT
PENCIL BUTTON HOLSTER BLD 10FT (ELECTRODE) IMPLANT
SET ARTHROSCOPY TUBING (MISCELLANEOUS) ×2
SET ARTHROSCOPY TUBING PVC (MISCELLANEOUS) ×1 IMPLANT
SLEEVE ARM SUSPENSION SYSTEM (MISCELLANEOUS) ×2 IMPLANT
SLING ULTRA II AB L (ORTHOPEDIC SUPPLIES) ×1 IMPLANT
SLING ULTRA II AB S (ORTHOPEDIC SUPPLIES) IMPLANT
SPONGE GAUZE 4X4 12PLY (GAUZE/BANDAGES/DRESSINGS) ×2 IMPLANT
SPONGE LAP 4X18 X RAY DECT (DISPOSABLE) IMPLANT
STOCKINETTE IMPERVIOUS LG (DRAPES) ×1 IMPLANT
SUCTION FRAZIER HANDLE 10FR (MISCELLANEOUS)
SUCTION TUBE FRAZIER 10FR DISP (MISCELLANEOUS) IMPLANT
SUT 2 FIBERLOOP 20 STRT BLUE (SUTURE)
SUT ETHILON 3 0 PS 1 (SUTURE) ×2 IMPLANT
SUT FIBERWIRE #2 38 T-5 BLUE (SUTURE)
SUT LASSO 45 DEGREE LEFT (SUTURE) IMPLANT
SUT LASSO 45D RIGHT (SUTURE) IMPLANT
SUT PDS AB 0 CT1 36 (SUTURE) IMPLANT
SUT TIGER TAPE 7 IN WHITE (SUTURE) ×1 IMPLANT
SUT VIC AB 0 CT1 36 (SUTURE) IMPLANT
SUT VIC AB 2-0 CT1 27 (SUTURE)
SUT VIC AB 2-0 CT1 TAPERPNT 27 (SUTURE) IMPLANT
SUTURE 2 FIBERLOOP 20 STRT BLU (SUTURE) IMPLANT
SUTURE FIBERWR #2 38 T-5 BLUE (SUTURE) IMPLANT
SYR 20CC LL (SYRINGE) ×2 IMPLANT
SYR CONTROL 10ML LL (SYRINGE) ×1 IMPLANT
TOWEL OR 17X24 6PK STRL BLUE (TOWEL DISPOSABLE) ×2 IMPLANT
TUBE CONNECTING 12X1/4 (SUCTIONS) ×4 IMPLANT
WAND 90 DEG TURBOVAC W/CORD (SURGICAL WAND) ×2 IMPLANT
WATER STERILE IRR 500ML POUR (IV SOLUTION) ×2 IMPLANT
YANKAUER SUCT BULB TIP NO VENT (SUCTIONS) IMPLANT

## 2015-06-16 NOTE — Anesthesia Postprocedure Evaluation (Signed)
Anesthesia Post Note  Patient: Hunter [name]  Procedure(s) Performed: Procedure(s) (LRB): LEFT SHOULDER ARTHROSCOPY WITH LABRAL DECRIDEMENT, SUBACROMIAL DECOMPRESSION AND DISTAL CLAVICLE EXCISION, ROTATOR CUFF REPAIR (Left)  Patient location during evaluation: PACU Anesthesia Type: General Level of consciousness: awake and alert Pain management: pain level controlled Vital Signs Assessment: post-procedure vital signs reviewed and stable Respiratory status: spontaneous breathing, nonlabored ventilation, respiratory function stable and patient connected to nasal cannula oxygen Cardiovascular status: blood pressure returned to baseline and stable Postop Assessment: no signs of nausea or vomiting Anesthetic complications: no    Last Vitals:  Filed Vitals:   06/16/15 1315 06/16/15 1552  BP: 169/79 183/74  Pulse: 65 88  Temp:    Resp: 18 9    Last Pain:  Filed Vitals:   06/16/15 1557  PainSc: 0-No pain                 Gildo Crisco L

## 2015-06-16 NOTE — Interval H&P Note (Signed)
History and Physical Interval Note:  06/16/2015 1:45 PM  Hunter [name]  has presented today for surgery, with the diagnosis of LEFT SHOULDER ACROMICLAVICULAR OSTEOARTHITIS  ROTATOR CUFF TEAR   The various methods of treatment have been discussed with the patient and family. After consideration of risks, benefits and other options for treatment, the patient has consented to  Procedure(s): LEFT SHOULDER ARTHROSCOPY WITH DEBRIDEMENT SUBACROMIAL DECOMPRESSION AND DISTAL CLAVICLE RESECTION, ROTATOR CUFF REPAIR (Left) as a surgical intervention .  The patient's history has been reviewed, patient examined, no change in status, stable for surgery.  I have reviewed the patient's chart and labs.  Questions were answered to the patient's satisfaction.     Rechelle Niebla ANDREW

## 2015-06-16 NOTE — Transfer of Care (Signed)
Immediate Anesthesia Transfer of Care Note  Patient: Hunter [name]  Procedure(s) Performed: Procedure(s) (LRB): LEFT SHOULDER ARTHROSCOPY WITH LABRAL DECRIDEMENT, SUBACROMIAL DECOMPRESSION AND DISTAL CLAVICLE EXCISION, ROTATOR CUFF REPAIR (Left)  Patient Location: PACU  Anesthesia Type: General  Level of Consciousness: awake, sedated, patient cooperative and responds to stimulation  Airway & Oxygen Therapy: Patient Spontanous Breathing and Patient connected to face mask oxygen  Post-op Assessment: Report given to PACU RN, Post -op Vital signs reviewed and stable and Patient moving all extremities  Post vital signs: Reviewed and stable  Complications: No apparent anesthesia complications

## 2015-06-16 NOTE — Anesthesia Preprocedure Evaluation (Addendum)
Anesthesia Evaluation  Patient identified by MRN, date of birth, ID band Patient awake    Reviewed: Allergy & Precautions, NPO status , Patient's Chart, lab work & pertinent test results  Airway Mallampati: II  TM Distance: >3 FB Neck ROM: Full    Dental no notable dental hx.    Pulmonary sleep apnea , COPD, former smoker,    Pulmonary exam normal breath sounds clear to auscultation       Cardiovascular hypertension, Pt. on medications + CAD, + Past MI and + Peripheral Vascular Disease  Normal cardiovascular exam Rhythm:Regular Rate:Normal     Neuro/Psych PSYCHIATRIC DISORDERS Depression  Neuromuscular disease negative psych ROS   GI/Hepatic Neg liver ROS, GERD  ,  Endo/Other  diabetes, Type 2, Insulin Dependent  Renal/GU negative Renal ROS     Musculoskeletal  (+) Arthritis ,   Abdominal   Peds  Hematology negative hematology ROS (+)   Anesthesia Other Findings   Reproductive/Obstetrics negative OB ROS                            Anesthesia Physical Anesthesia Plan  ASA: III  Anesthesia Plan: General and Regional   Post-op Pain Management: GA combined w/ Regional for post-op pain   Induction: Intravenous  Airway Management Planned: Oral ETT  Additional Equipment:   Intra-op Plan:   Post-operative Plan: Extubation in OR  Informed Consent: I have reviewed the patients History and Physical, chart, labs and discussed the procedure including the risks, benefits and alternatives for the proposed anesthesia with the patient or authorized representative who has indicated his/her understanding and acceptance.   Dental advisory given  Plan Discussed with: CRNA  Anesthesia Plan Comments:         Anesthesia Quick Evaluation

## 2015-06-16 NOTE — H&P (View-Only) (Signed)
NPO AFTER MN WITH EXCEPTION CLEAR LIQUIDS UNTIL 0700 (NO CREAM [place] PRODUCTS).  ARRIVE AT 1100.  NEEDS ISTAT 8.  CURRENT EKG IN CHART AND EPIC . ALSO CARDIAC CLEARANCE W/ CHART.  WILL TAKE PRILOSEC, METOPROLOL, AND GABAPENTIN AM DOS W/ SIPS OF WATER AND IF NEEDED TAKE TRAMADOL.  WILL BRING CPAP. PT IS WHEELCHAIR BOUND BUT CAN TRANSFER SELF.  REVIEWED CHART W/ DR Lyndle Herrlich MDA,  OK TO PROCEED.

## 2015-06-16 NOTE — Anesthesia Procedure Notes (Addendum)
Anesthesia Regional Block:  Interscalene brachial plexus block  Pre-Anesthetic Checklist: ,, timeout performed, Correct Patient, Correct Site, Correct Laterality, Correct Procedure, Correct Position, site marked, Risks and benefits discussed, Surgical consent,  Pre-op evaluation,  Post-op pain management  Laterality: Left  Prep: chloraprep       Needles:  Injection technique: Single-shot  Needle Type: Stimulator Needle - 40     Needle Length: 9cm 9 cm Needle Gauge: 21 and 21 G    Additional Needles:  Procedures: ultrasound guided (picture in chart) and nerve stimulator Interscalene brachial plexus block Narrative:  Injection made incrementally with aspirations every 5 mL. Anesthesiologist: Nolon Nations  Additional Notes: BP cuff, EKG monitors applied. Sedation begun. Nerve location verified with U/S. Anesthetic injected incrementally, slowly , and after neg aspirations under direct u/s guidance. Very difficult block due to extremely large/obese neck. Needle depth ~7 cm and difficult needle visibility. Perineural spread observed. Tolerated well.   Procedure Name: Intubation Date/Time: 06/16/2015 2:03 PM Performed by: Denna Haggard D Pre-anesthesia Checklist: Patient identified, Emergency Drugs available, Suction available and Patient being monitored Patient Re-evaluated:Patient Re-evaluated prior to inductionOxygen Delivery Method: Circle system utilized Preoxygenation: Pre-oxygenation with 100% oxygen Intubation Type: IV induction Ventilation: Mask ventilation without difficulty Laryngoscope Size: Mac, 3 and Glidescope Grade View: Grade IV Tube type: Oral Tube size: 7.0 mm Number of attempts: 1 Airway Equipment and Method: Stylet and Oral airway Placement Confirmation: ETT inserted through vocal cords under direct vision,  positive ETCO2 and breath sounds checked- equal and bilateral Secured at: 23 cm Tube secured with: Tape Dental Injury: Teeth and Oropharynx as per  pre-operative assessment

## 2015-06-16 NOTE — Progress Notes (Signed)
Pt has CPAP from home and wishes to utilize tonight.  RT inspected machine and cords for damages or naked wires and none were present.  Machine appears to be working properly.  Pt placed on home CPAP machine via FFM, pt tolerating well at this time.  RT to monitor and assess as needed.

## 2015-06-16 NOTE — Op Note (Signed)
Dictated#[id number]

## 2015-06-16 NOTE — H&P (View-Only) (Signed)
Patient aware of change of day of procedure.To [facility] 06/16/15-at 1100-prior instructions reviewed,stated understands.Istat 8 on arrival.

## 2015-06-16 NOTE — Interval H&P Note (Signed)
History and Physical Interval Note:  06/16/2015 1:46 PM  Hunter [name]  has presented today for surgery, with the diagnosis of LEFT SHOULDER ACROMICLAVICULAR OSTEOARTHITIS  ROTATOR CUFF TEAR   The various methods of treatment have been discussed with the patient and family. After consideration of risks, benefits and other options for treatment, the patient has consented to  Procedure(s): LEFT SHOULDER ARTHROSCOPY WITH DEBRIDEMENT SUBACROMIAL DECOMPRESSION AND DISTAL CLAVICLE RESECTION, ROTATOR CUFF REPAIR (Left) as a surgical intervention .  The patient's history has been reviewed, patient examined, no change in status, stable for surgery.  I have reviewed the patient's chart and labs.  Questions were answered to the patient's satisfaction.     Dene Nazir ANDREW
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
## 2015-06-17 ENCOUNTER — Encounter (HOSPITAL_BASED_OUTPATIENT_CLINIC_OR_DEPARTMENT_OTHER): Payer: Self-pay | Admitting: Specialist

## 2015-06-17 DIAGNOSIS — M75122 Complete rotator cuff tear or rupture of left shoulder, not specified as traumatic: Secondary | ICD-10-CM | POA: Diagnosis not present

## 2015-06-17 LAB — GLUCOSE, CAPILLARY
GLUCOSE-CAPILLARY: 118 mg/dL — AB (ref 65–99)
Glucose-Capillary: 107 mg/dL — ABNORMAL HIGH (ref 65–99)
Glucose-Capillary: 136 mg/dL — ABNORMAL HIGH (ref 65–99)
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
## 2015-06-17 NOTE — Care Management Obs Status (Signed)
[place] NOTIFICATION   Patient Details  Name: Hunter [name] MRN: [id number] Date of Birth: 04/09/1950   Medicare Observation Status Notification Given:  Yes    MahabirJuliann Pulse, RN 06/17/2015, 10:33 AM

## 2015-06-17 NOTE — Discharge Summary (Signed)
Physician Discharge Summary  Patient ID: Hunter [name] MRN: [id number] DOB/AGE: September 20, 1950 65 y.o.  Admit date: 06/16/2015 Discharge date: 06/17/2015  Admission Diagnoses: Left rotator cuff tear  Discharge Diagnoses:  Active Problems:   S/P arthroscopy of shoulder   S/P rotator cuff repair   Discharged Condition: good  Hospital Course:  Hunter [name] is a 65 y.o. who was admitted to [facility]. They were brought to the operating room on 06/16/2015 and underwent Procedure(s): LEFT SHOULDER ARTHROSCOPY WITH LABRAL DECRIDEMENT, SUBACROMIAL DECOMPRESSION AND DISTAL CLAVICLE EXCISION, ROTATOR CUFF REPAIR.  Patient tolerated the procedure well and was later transferred to the recovery room and then to the orthopaedic floor for postoperative care.  They were given PO and IV analgesics for pain control following their surgery.  They were given 24 hours of postoperative antibiotics of  Anti-infectives    Start     Dose/Rate Route Frequency Ordered Stop   06/16/15 2000  ceFAZolin (ANCEF) IVPB 2g/100 mL premix     2 g 200 mL/hr over 30 Minutes Intravenous Every 6 hours 06/16/15 1705 06/17/15 1359   06/16/15 1056  ceFAZolin (ANCEF) IVPB 2g/100 mL premix  Status:  Discontinued     2 g 200 mL/hr over 30 Minutes Intravenous On call to O.R. 06/16/15 1057 06/16/15 1059   06/16/15 1056  ceFAZolin (ANCEF) IVPB 2g/100 mL premix     2 g 200 mL/hr over 30 Minutes Intravenous On call to O.R. 06/16/15 1056 06/16/15 1420       Discharge planing place.  Patient had a good night on the evening of surgery and started to get up OOB with therapy on day one.   Dressing was with normal limits.  The patient had progressed with therapy and meeting their goals. Patient was seen in rounds and was ready to go home.  Consults: n/a  Significant Diagnostic Studies:n/a  Treatments: n/a  Discharge Exam: Blood pressure 111/91, pulse 65, temperature 97.8 F (36.6 C), temperature source Oral, resp. rate  18, height 5\' 6"  (1.676 m), weight 97.523 kg (215 lb), SpO2 96 %. Alert and oriented x3. RRR, Lungs clear, BS x4. Left Calf soft and non tender. L shoulder dressing C/D/I. No DVT signs. No signs of infection or compartment syndrome. LUE grossly neurovascularly intact. Sling in place   Disposition: 01-Home or Self Care  Discharge Instructions    Call MD / Call 911    Complete by:  As directed   If you experience chest pain or shortness of breath, CALL 911 and be transported to the hospital emergency room.  If you develope a fever above 101 F, pus (white drainage) or increased drainage or redness at the wound, or calf pain, call your surgeon's office.     Constipation Prevention    Complete by:  As directed   Drink plenty of fluids.  Prune juice may be helpful.  You may use a stool softener, such as Colace (over the counter) 100 mg twice a day.  Use MiraLax (over the counter) for constipation as needed.     Diet - low sodium heart healthy    Complete by:  As directed      Discharge instructions    Complete by:  As directed   DISCHARGE INSTRUCTIONS: ________________________________________________________________________________  POST-OPERATIVE INSTRUCTIONS ROTATOR CUFF REPAIR OR BANKART/CAPSULORRHAPHY  Hart Robinsons, MD [facility] [address], [address] [place], [place]  [address] [phone]      You will be expected to have moderate pain in  the affected shoulder for a few weeks, however, the first two to four days will be the most severe pain.  Prescriptions have been provided for the pain.  The pain can be markedly reduced by using ice on your shoulder over the dressing. To prevent constipation while taking the pain medications, take Colace 100mg . twice a day while taking narcotics.  If you get constipated, use Miralax 17 gm once a day and drink plenty of fluids.  These medications can be gotten at the pharmacy without a prescription. Remove the bandage and  shower on day 3.  Apply Neosporin and band-aids to the sutures.  Keep your arm in the sling/immobilizer at all times except to shower.  When showering, keep your arm at your side.  No range of motion of your shoulder.  You may bend your elbow and wrist to keep them from getting stiff and swollen. Report to your doctor should any of the following situations occur: Swelling or inability to move your fingers Coldness, turning pale or blue of your fingers Loss of sensation, numbness or tingling of your fingers Unusual smell or odor from under the dressing Excessive bleeding or drainage from the surgical site Pain not relieved by medications your doctor has prescribed for you Call the office to schedule a follow-up appointment in 7-10 days.     Increase activity slowly as tolerated    Complete by:  As directed             Medication List    TAKE these medications        albuterol 108 (90 Base) MCG/ACT inhaler  Commonly known as:  PROVENTIL HFA;VENTOLIN HFA  Inhale 2 puffs into the lungs every 6 (six) hours as needed for wheezing or shortness of breath.     amLODipine 5 MG tablet  Commonly known as:  NORVASC  Take 1 tablet (5 mg total) by mouth daily.     aspirin 81 MG tablet  Take 81 mg by mouth daily.     atorvastatin 40 MG tablet  Commonly known as:  LIPITOR  Take 1 tablet (40 mg total) by mouth daily at 6 PM.     cetirizine 10 MG tablet  Commonly known as:  ZYRTEC  Take 10 mg by mouth daily.     CHANTIX 1 MG tablet  Generic drug:  varenicline  Take 1 mg by mouth 2 (two) times daily.     CHLOR-TABLETS 4 MG tablet  Generic drug:  chlorpheniramine  Take 4 mg by mouth daily.     clopidogrel 75 MG tablet  Commonly known as:  PLAVIX  Take 1 tablet (75 mg total) by mouth daily.     doxazosin 2 MG tablet  Commonly known as:  CARDURA  Take 2 mg by mouth daily.     gabapentin 300 MG capsule  Commonly known as:  NEURONTIN  Take 300 mg by mouth 2 (two) times daily.      HUMALOG KWIKPEN 100 UNIT/ML KiwkPen  Generic drug:  insulin lispro  Inject 20 Units into the skin 3 (three) times daily with meals.     INVOKANA 100 MG Tabs tablet  Generic drug:  canagliflozin  Take 100 mg by mouth daily before breakfast.     ipratropium 0.06 % nasal spray  Commonly known as:  ATROVENT  Place 2 sprays into both nostrils 4 (four) times daily as needed for rhinitis.     LEVEMIR FLEXPEN 100 UNIT/ML Pen  Generic drug:  Insulin Detemir  Inject 33 Units into the skin every morning.     methocarbamol 500 MG tablet  Commonly known as:  ROBAXIN  Take 1 tablet (500 mg total) by mouth every 6 (six) hours as needed.     metoprolol 50 MG tablet  Commonly known as:  LOPRESSOR  TAKE 1 TABLET (50 MG TOTAL) BY MOUTH 2 (TWO) TIMES DAILY.     mometasone 50 MCG/ACT nasal spray  Commonly known as:  NASONEX  Place 2 sprays into the nose daily as needed (for nasal congestion).     naproxen sodium 220 MG tablet  Commonly known as:  ANAPROX  Take 440 mg by mouth daily as needed (pain).     nitroGLYCERIN 0.4 MG SL tablet  Commonly known as:  NITROSTAT  Place 0.4 mg under the tongue every 5 (five) minutes as needed for chest pain (chest pain).     NOVOFINE 32G X 6 MM Misc  Generic drug:  Insulin Pen Needle     omeprazole 40 MG capsule  Commonly known as:  PRILOSEC  Take 40 mg by mouth 2 (two) times daily.     ONE TOUCH ULTRA TEST test strip  Generic drug:  glucose blood  1 each daily.     ONETOUCH DELICA LANCETS Misc  daily.     oxybutynin 15 MG 24 hr tablet  Commonly known as:  DITROPAN XL  Take 15 mg by mouth daily.     oxyCODONE-acetaminophen 5-325 MG tablet  Commonly known as:  ROXICET  Take 1-2 tablets by mouth every 4 (four) hours as needed.     STOOL SOFTENER PO  Take 100 mg by mouth daily.     traMADol 50 MG tablet  Commonly known as:  ULTRAM  Take 1 tablet by mouth 3 (three) times daily as needed. For pain     triamterene-hydrochlorothiazide 37.5-25 MG  tablet  Commonly known as:  MAXZIDE-25  Take 1 tablet by mouth daily.     Vitamin D (Ergocalciferol) 50000 units Caps capsule  Commonly known as:  DRISDOL  Take 50,000 Units by mouth once a week. [id number]         Signed: Lajean Manes 06/17/2015, 7:43 AM

## 2015-06-17 NOTE — Addendum Note (Signed)
Addendum  created 06/17/15 0916 by Suan Halter, CRNA   Modules edited: Anesthesia Responsible Staff, Charges VN

## 2015-06-17 NOTE — Progress Notes (Signed)
Completed D/C teaching/ Gave prescriptions. Answered questions. Pt will be D/C home with family in stable condition. 

## 2015-06-17 NOTE — Care Management Note (Signed)
Case Management Note  Patient Details  Name: DELMAS KALKOWSKI MRN: [id number] Date of Birth: 1950/06/03  Subjective/Objective:64 y/o m admitted w/L shoulder arthroscopy. From home.                    Action/Plan:d/c home no needs or orders.   Expected Discharge Date:                 Expected Discharge Plan:  Home/Self Care  In-House Referral:     Discharge planning Services  CM Consult  Post Acute Care Choice:    Choice offered to:     DME Arranged:    DME Agency:     HH Arranged:    [place] Agency:     Status of Service:  Completed, signed off  Medicare Important Message Given:    Date Medicare IM Given:    Medicare IM give by:    Date Additional Medicare IM Given:    Additional Medicare Important Message give by:     If discussed at [place] of Stay Meetings, dates discussed:    Additional Comments:  Dessa Phi, RN 06/17/2015, 10:33 AM

## 2015-06-17 NOTE — Op Note (Signed)
NAMEMarland [name]  DEMETRUIS, DEDO NO.:  [id number]  MEDICAL RECORD NO.:  [id number]  LOCATION:  23                         FACILITY:  [facility]  PHYSICIAN:  Cynda Familia, M.D.DATE OF BIRTH:  1950/12/14  DATE OF PROCEDURE: DATE OF DISCHARGE:                              OPERATIVE REPORT   PREOPERATIVE DIAGNOSIS:  Left shoulder rotator cuff tear acromioclavicular arthritis.  POSTOPERATIVE DIAGNOSES: 1. Left shoulder type 1 labral tearing. 2. High-grade partial rotator cuff tear, supraspinatus 90%. 3. Acromioclavicular arthritis.  PROCEDURE: 1. Right shoulder glenohumeral arthroscopy with labral debridement. 2. Arthroscopic subacromial decompression, acromioplasty, bursectomy,     coracoacromial ligament release. 3. Arthroscopic distal clavicle resection Mumford procedure. 4. Arthroscopic rotator cuff repair.  SURGEON:  Cynda Familia, MD  ASSISTED BY:  Wyatt Portela, PA-C  ANESTHESIA:  Interscalene block, general.  ESTIMATED BLOOD LOSS:  Minimal.  DRAINS:  None.  COMPLICATION:  None.  DISPOSITION:  PACU, stable.  OPERATIVE DETAILS:  The patient and family counseled in holding area. Correct site was marked and signed appropriately.  Chart reviewed and signed.  IV antibiotics given, taken to OR, placed on the supine position under general anesthesia, placed on the OR table, placed in right lateral decubitus position, properly padded and bumped.  Shoulder prepped with DuraPrep and draped in sterile fashion.  Sterile shoulder holder was utilized,  __________ 10 degrees forward flexion, 20 pounds longitudinal traction.  Again time-out had reconfirmed on the left side. Arthroscopic portal was established posteriorly.  Arthroscope placed on glenohumeral joint, diagnostic arthroscopy appeared to be intact synovial capsule at this point in time.  There was small roughing on the supraspinatus and the biceps insertion.  Access of entry portal was  made outside through rotator cuff interval.  Biceps was intact.  Biceps labral anchor was stable.  There was type 1 degenerative tearing of the glenoid labrum which was debrided with motorized shaver cautery system, to a smooth edge.  Glenohumeral ligaments were nice and were excellent.  Subacromial region __________ subacromial bursa.  Lateral portal established.  Neurovascular structures were protected and axillary nerve and subacromial bursectomy was performed.  Had a __________.  The bur was then utilized to form an anterior, inferior, and lateral acromioplasty, __________ type 1 acromial morphology, found to have a complete tear of the rotator cuff on the bursal side with retraction. It was mobilized as far as possible.  A small puncturing completed this distal complete tear with minimal effort.  A small puncture was made superiorly and Ar[employer]rex [employer] anchor was placed at appropriate angle and suture was docked to that.  __________ was placed to the rotator cuff and to the cannula without complication or problem.  Arm was then abducted and those sutures were placed into another anchor in the greater tuberosity which have had bleeding bone with a bur for a suture bridge double row technique.  __________ rotator cuff down to bleeding bone __________ excellent repair.  __________ scope was removed.  Taken out of traction.  Portal closed with nylon suture, another 15 mL Sensorcaine was placed through the skin and subacromion at the end of the surgery.  Satisfied, __________ awakened and taken from the operating room to the PACU in  stable condition.  He will be stabilized in PACU and discharged home.  To help the patient positioning, prepping, draping, technical and surgical assistance throughout entire case, wound closure application, dressing sling, Mr. Wyatt Portela PA-C assistance was needed.          ______________________________ Cynda Familia,  M.D.     RAC/MEDQ  D:  06/16/2015  T:  06/17/2015  Job:  [id number]

## 2015-06-30 NOTE — Brief Op Note (Signed)
NAMEMarland [name] [name], [name] NO.: [id number]  MEDICAL RECORD NO.: [id number]  LOCATION: 58 FACILITY: [facility]  PHYSICIAN: Cynda Familia, M.D.DATE OF BIRTH: 02/16/1950  DATE OF PROCEDURE: DATE OF DISCHARGE:   OPERATIVE REPORT   PREOPERATIVE DIAGNOSIS: Left shoulder rotator cuff tear acromioclavicular arthritis.  POSTOPERATIVE DIAGNOSES: 1. Left shoulder type 1 labral tearing. 2. High-grade partial rotator cuff tear, supraspinatus 90%. 3. Acromioclavicular arthritis.  PROCEDURE: 1. Right shoulder glenohumeral arthroscopy with labral debridement. 2. Arthroscopic subacromial decompression, acromioplasty, bursectomy,  coracoacromial ligament release. 3. Arthroscopic distal clavicle resection Mumford procedure. 4. Arthroscopic rotator cuff repair.  SURGEON: Cynda Familia, MD  ASSISTED BY: Wyatt Portela, PA-C  ANESTHESIA: Interscalene block, general.  ESTIMATED BLOOD LOSS: Minimal.  DRAINS: None.  COMPLICATION: None.  DISPOSITION: PACU, stable.  OPERATIVE DETAILS: The patient and family counseled in holding area. Correct site was marked and signed appropriately. Chart reviewed and signed. IV antibiotics given, taken to OR, placed on the supine position under general anesthesia, placed on the OR table, placed in right lateral decubitus position, properly padded and bumped. Shoulder prepped with DuraPrep and draped in sterile fashion. Sterile shoulder holder was utilized, Placed in  10 degrees forward flexion, 20 pounds longitudinal traction. Again time-out had reconfirmed on the left side. Arthroscopic portal was established posteriorly. Arthroscope placed on glenohumeral joint, diagnostic arthroscopy appeared to be intact synovial capsule at this point in time. There was small roughing on the supraspinatus and the biceps insertion. Access of entry  portal was made outside  through rotator cuff interval. Biceps was intact. Biceps labral anchor was stable. There was type 1 degenerative tearing of the glenoid labrum which was debrided with motorized shaver cautery system, to a smooth edge.The glenohumeral ligaments were excellent.  Subacromial region inspected subacromial bursa bursa was thickened ans debrided.. Lateral portal established. Neurovascular structures were protected and axillary nerve and subacromial bursectomy was performed. Had a type 2 acromion.. The bur was then utilized from an anterior, inferior, and lateral acromioplasty to convert to type 1. type 1 acromial morphology, found to have a complete tear of the rotator cuff on the bursal side with retraction. He was mobilized as far as possible, This completed this to a complete tear with minimal effort. A small puncture was made superiorly and Ar[employer] anchor was placed at appropriate angle and suture was docked to that. . Arm was then abducted and those sutures were placed into another anchor in the greater tuberosity for a suture bridge technique  technique.  This produced an excellent repair. Then equipment and  scope was removed. Taken out of traction. Portal closed with nylon suture another 15 mL Sensorcaine was placed, skin and Subacromial and thentaken from the operating room to the PACU in stable condition. He will be stabilized in PACU and discharged home.To help the patient positioning, prepping, draping, technical and surgical assistance throughout entire case, wound closure application, dressing sling Mr Morene Rankins was needed throughout the case.     ______________________________ Cynda Familia, M.D.

## 2015-08-01 ENCOUNTER — Ambulatory Visit: Payer: Medicare Other | Admitting: Gastroenterology

## 2015-09-16 ENCOUNTER — Encounter: Payer: Self-pay | Admitting: Family

## 2015-09-22 ENCOUNTER — Encounter: Payer: Self-pay | Admitting: Family

## 2015-09-22 ENCOUNTER — Ambulatory Visit (INDEPENDENT_AMBULATORY_CARE_PROVIDER_SITE_OTHER): Payer: Medicare Other | Admitting: Family

## 2015-09-22 ENCOUNTER — Ambulatory Visit (HOSPITAL_COMMUNITY)
Admission: RE | Admit: 2015-09-22 | Discharge: 2015-09-22 | Disposition: A | Discharge status: Home / Self Care | Payer: Medicare Other | Source: Ambulatory Visit | Attending: Family | Admitting: Family

## 2015-09-22 VITALS — BP 148/66 | HR 60 | Temp 96.7°F | Resp 20 | Ht 65.0 in | Wt 215.0 lb

## 2015-09-22 DIAGNOSIS — E669 Obesity, unspecified: Secondary | ICD-10-CM

## 2015-09-22 DIAGNOSIS — I739 Peripheral vascular disease, unspecified: Secondary | ICD-10-CM

## 2015-09-22 DIAGNOSIS — I872 Venous insufficiency (chronic) (peripheral): Secondary | ICD-10-CM | POA: Diagnosis not present

## 2015-09-22 DIAGNOSIS — Z87891 Personal history of nicotine dependence: Secondary | ICD-10-CM | POA: Diagnosis not present

## 2015-09-22 DIAGNOSIS — I779 Disorder of arteries and arterioles, unspecified: Secondary | ICD-10-CM

## 2015-09-22 DIAGNOSIS — E1151 Type 2 diabetes mellitus with diabetic peripheral angiopathy without gangrene: Secondary | ICD-10-CM

## 2015-09-22 NOTE — Progress Notes (Signed)
Vitals:   09/22/15 1117 09/22/15 1127  BP: (!) 152/69 (!) 148/66  Pulse: 60   Resp: 20   Temp: (!) 96.7 F (35.9 C)   TempSrc: Oral   Weight: 215 lb (97.5 kg)   Height: 5\' 5"  (1.651 m)

## 2015-09-22 NOTE — Patient Instructions (Signed)
Peripheral Vascular Disease  Peripheral vascular disease (PVD) is a disease of the blood vessels that are not part of your heart and brain. A simple term for PVD is poor circulation. In most cases, PVD narrows the blood vessels that carry blood from your heart to the rest of your body. This can result in a decreased supply of blood to your arms, legs, and internal organs, like your stomach or kidneys. However, it most often affects a person's lower legs and feet.  There are two types of PVD.  · Organic PVD. This is the more common type. It is caused by damage to the structure of blood vessels.  · Functional PVD. This is caused by conditions that make blood vessels contract and tighten (spasm).  Without treatment, PVD tends to get worse over time.  PVD can also lead to acute ischemic limb. This is when an arm or limb suddenly has trouble getting enough blood. This is a medical emergency.  CAUSES  Each type of PVD has many different causes. The most common cause of PVD is buildup of a fatty material (plaque) inside of your arteries (atherosclerosis). Small amounts of plaque can break off from the walls of the blood vessels and become lodged in a smaller artery. This blocks blood flow and can cause acute ischemic limb.  Other common causes of PVD include:  · Blood clots that form inside of blood vessels.  · Injuries to blood vessels.  · Diseases that cause inflammation of blood vessels or cause blood vessel spasms.  · Health behaviors and health history that increase your risk of developing PVD.  RISK FACTORS   You may have a greater risk of PVD if you:  · Have a family history of PVD.  · Have certain medical conditions, including:    High cholesterol.    Diabetes.    High blood pressure (hypertension).    Coronary heart disease.    Past problems with blood clots.    Past injury, such as burns or a broken bone. These may have damaged blood vessels in your limbs.    Buerger disease. This is caused by inflamed blood  vessels in your hands and feet.    Some forms of arthritis.    Rare birth defects that affect the arteries in your legs.  · Use tobacco.  · Do not get enough exercise.  · Are obese.  · Are age 50 or older.  SIGNS AND SYMPTOMS   PVD may cause many different symptoms. Your symptoms depend on what part of your body is not getting enough blood. Some common signs and symptoms include:  · Cramps in your lower legs. This may be a symptom of poor leg circulation (claudication).  · Pain and weakness in your legs while you are physically active that goes away when you rest (intermittent claudication).  · Leg pain when at rest.  · Leg numbness, tingling, or weakness.  · Coldness in a leg or foot, especially when compared with the other leg.  · Skin or hair changes. These can include:    Hair loss.    Shiny skin.    Pale or bluish skin.    Thick toenails.  · Inability to get or maintain an erection (erectile dysfunction).  People with PVD are more prone to developing ulcers and sores on their toes, feet, or legs. These may take longer than normal to heal.  DIAGNOSIS  Your health care provider may diagnose PVD from your signs and symptoms.   The health care provider will also do a physical exam. You may have tests to find out what is causing your PVD and determine its severity. Tests may include:  · Blood pressure recordings from your arms and legs and measurements of the strength of your pulses (pulse volume recordings).  · Imaging studies using sound waves to take pictures of the blood flow through your blood vessels (Doppler ultrasound).  · Injecting a dye into your blood vessels before having imaging studies using:    X-rays (angiogram or arteriogram).    Computer-generated X-rays (CT angiogram).    A powerful electromagnetic field and a computer (magnetic resonance angiogram or MRA).  TREATMENT  Treatment for PVD depends on the cause of your condition and the severity of your symptoms. It also depends on your age. Underlying  causes need to be treated and controlled. These include long-lasting (chronic) conditions, such as diabetes, high cholesterol, and high blood pressure. You may need to first try making lifestyle changes and taking medicines. Surgery may be needed if these do not work.  Lifestyle changes may include:  · Quitting smoking.  · Exercising regularly.  · Following a low-fat, low-cholesterol diet.  Medicines may include:  · Blood thinners to prevent blood clots.  · Medicines to improve blood flow.  · Medicines to improve your blood cholesterol levels.  Surgical procedures may include:  · A procedure that uses an inflated balloon to open a blocked artery and improve blood flow (angioplasty).  · A procedure to put in a tube (stent) to keep a blocked artery open (stent implant).  · Surgery to reroute blood flow around a blocked artery (peripheral bypass surgery).  · Surgery to remove dead tissue from an infected wound on the affected limb.  · Amputation. This is surgical removal of the affected limb. This may be necessary in cases of acute ischemic limb that are not improved through medical or surgical treatments.  HOME CARE INSTRUCTIONS  · Take medicines only as directed by your health care provider.  · Do not use any tobacco products, including cigarettes, chewing tobacco, or electronic cigarettes.  If you need help quitting, ask your health care provider.  · Lose weight if you are overweight, and maintain a healthy weight as directed by your health care provider.  · Eat a diet that is low in fat and cholesterol. If you need help, ask your health care provider.  · Exercise regularly. Ask your health care provider to suggest some good activities for you.  · Use compression stockings or other mechanical devices as directed by your health care provider.  · Take good care of your feet.    Wear comfortable shoes that fit well.    Check your feet often for any cuts or sores.  SEEK MEDICAL CARE IF:  · You have cramps in your legs  while walking.  · You have leg pain when you are at rest.  · You have coldness in a leg or foot.  · Your skin changes.  · You have erectile dysfunction.  · You have cuts or sores on your feet that are not healing.  SEEK IMMEDIATE MEDICAL CARE IF:  · Your arm or leg turns cold and blue.  · Your arms or legs become red, warm, swollen, painful, or numb.  · You have chest pain or trouble breathing.  · You suddenly have weakness in your face, arm, or leg.  · You become very confused or lose the ability to speak.  ·   You suddenly have a very bad headache or lose your vision.     This information is not intended to replace advice given to you by your health care provider. Make sure you discuss any questions you have with your health care provider.     Document Released: 01/26/2004 Document Revised: 01/08/2014 Document Reviewed: 05/28/2013  Elsevier Interactive Patient Education ©2016 [employer]    Venous Stasis or Chronic Venous Insufficiency  Chronic venous insufficiency, also called venous stasis, is a condition that affects the veins in the legs. The condition prevents blood from being pumped through these veins effectively. Blood may no longer be pumped effectively from the legs back to the heart. This condition can range from mild to severe. With proper treatment, you should be able to continue with an active life.  CAUSES   Chronic venous insufficiency occurs when the vein walls become stretched, weakened, or damaged or when valves within the vein are damaged. Some common causes of this include:  · High blood pressure inside the veins (venous hypertension).  · Increased blood pressure in the leg veins from long periods of sitting or standing.  · A blood clot that blocks blood flow in a vein (deep vein thrombosis).  · Inflammation of a superficial vein (phlebitis) that causes a blood clot to form.  RISK FACTORS  Various things can make you more likely to develop chronic venous insufficiency, including:  · Family  history of this condition.  · Obesity.  · Pregnancy.  · Sedentary lifestyle.  · Smoking.  · Jobs requiring long periods of standing or sitting in one place.  · Being a certain age. Women in their 40s and 50s and men in their 70s are more likely to develop this condition.  SIGNS AND SYMPTOMS   Symptoms may include:   · Varicose veins.  · Skin breakdown or ulcers.  · Reddened or discolored skin on the leg.  · Brown, smooth, tight, and painful skin just above the ankle, usually on the inside surface (lipodermatosclerosis).  · Swelling.  DIAGNOSIS   To diagnose this condition, your health care provider will take a medical history and do a physical exam. The following tests may be ordered to confirm the diagnosis:  · Duplex ultrasound--A procedure that produces a picture of a blood vessel and nearby organs and also provides information on blood flow through the blood vessel.  · Plethysmography--A procedure that tests blood flow.  · A venogram, or venography--A procedure used to look at the veins using X-ray and dye.  TREATMENT  The goals of treatment are to help you return to an active life and to minimize pain or disability. Treatment will depend on the severity of the condition. Medical procedures may be needed for severe cases. Treatment options may include:   · Use of compression stockings. These can help with symptoms and lower the chances of the problem getting worse, but they do not cure the problem.  · Sclerotherapy--A procedure involving an injection of a material that "dissolves" the damaged veins. Other veins in the network of blood vessels take over the function of the damaged veins.  · Surgery to remove the vein or cut off blood flow through the vein (vein stripping or laser ablation surgery).  · Surgery to repair a valve.  HOME CARE INSTRUCTIONS   · Wear compression stockings as directed by your health care provider.  · Only take over-the-counter or prescription medicines for pain, discomfort, or fever as  directed by your health   care provider.  · Follow up with your health care provider as directed.  SEEK MEDICAL CARE IF:   · You have redness, swelling, or increasing pain in the affected area.  · You see a red streak or line that extends up or down from the affected area.  · You have a breakdown or loss of skin in the affected area, even if the breakdown is small.  · You have an injury to the affected area.  SEEK IMMEDIATE MEDICAL CARE IF:   · You have an injury and open wound in the affected area.  · Your pain is severe and does not improve with medicine.  · You have sudden numbness or weakness in the foot or ankle below the affected area, or you have trouble moving your foot or ankle.  · You have a fever or persistent symptoms for more than 2-3 days.  · You have a fever and your symptoms suddenly get worse.  MAKE SURE YOU:   · Understand these instructions.  · Will watch your condition.  · Will get help right away if you are not doing well or get worse.     This information is not intended to replace advice given to you by your health care provider. Make sure you discuss any questions you have with your health care provider.     Document Released: 04/23/2006 Document Revised: 10/08/2012 Document Reviewed: 08/25/2012  Elsevier Interactive Patient Education ©2016 [employer]
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
## 2015-10-04 ENCOUNTER — Ambulatory Visit (INDEPENDENT_AMBULATORY_CARE_PROVIDER_SITE_OTHER): Payer: Medicare Other | Admitting: Gastroenterology

## 2015-10-04 ENCOUNTER — Encounter: Payer: Self-pay | Admitting: Gastroenterology

## 2015-10-04 ENCOUNTER — Telehealth: Payer: Self-pay

## 2015-10-04 VITALS — BP 130/62 | HR 64 | Ht 65.0 in | Wt 215.5 lb

## 2015-10-04 DIAGNOSIS — K625 Hemorrhage of anus and rectum: Secondary | ICD-10-CM

## 2015-10-04 DIAGNOSIS — J438 Other emphysema: Secondary | ICD-10-CM

## 2015-10-04 DIAGNOSIS — Z800 Family history of malignant neoplasm of digestive organs: Secondary | ICD-10-CM

## 2015-10-04 DIAGNOSIS — I259 Chronic ischemic heart disease, unspecified: Secondary | ICD-10-CM

## 2015-10-04 DIAGNOSIS — K5901 Slow transit constipation: Secondary | ICD-10-CM

## 2015-10-04 DIAGNOSIS — E1151 Type 2 diabetes mellitus with diabetic peripheral angiopathy without gangrene: Secondary | ICD-10-CM

## 2015-10-04 MED ORDER — NA SULFATE-K SULFATE-MG SULF 17.5-3.13-1.6 GM/177ML PO SOLN: 1.0000 | Freq: Once | ORAL | 0 refills | Status: AC

## 2015-10-04 NOTE — Telephone Encounter (Signed)
  RE: Hunter [name] DOB: 03-13-1950 MRN: [id number]   Dear Dr [name],    We have scheduled the above patient for an endoscopic procedure (colonoscopy). Our records show that he is on anticoagulation therapy.   Please advise as to how long the patient may come off his therapy of Plavix prior to the procedure, which is scheduled for 11-21-2015.  Please fax back/ or route the completed form to [phone].   Sincerely,    Christell Constant
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
## 2015-10-04 NOTE — Patient Instructions (Signed)
If you are age 65 or older, your body mass index should be between 23-30. Your Body mass index is 35.86 kg/m. If this is out of the aforementioned range listed, please consider follow up with your Primary Care Provider.  If you are age 37 or younger, your body mass index should be between 19-25. Your Body mass index is 35.86 kg/m. If this is out of the aformentioned range listed, please consider follow up with your Primary Care Provider.   You have been scheduled for a colonoscopy. Please follow written instructions given to you at your visit today.  Please pick up your prep supplies at the pharmacy within the next 1-3 days. If you use inhalers (even only as needed), please bring them with you on the day of your procedure. Your physician has requested that you go to www.startemmi.com and enter the access code given to you at your visit today. This web site gives a general overview about your procedure. However, you should still follow specific instructions given to you by our office regarding your preparation for the procedure.  Thank you for choosing [facility]  Dr Wilfrid Lund III

## 2015-10-05 NOTE — Telephone Encounter (Signed)
I spoke to patient and advised him to stop Plavix 5 days before procedure and resume 2 days after procedure, as per Dr [name] recommendation.  He repeated change and voiced understanding.

## 2015-10-05 NOTE — Telephone Encounter (Signed)
Please have him stop his Plavix 5 days prior to endoscopy. Resume 2 days following endoscopy. [name], MD

## 2015-11-01 ENCOUNTER — Ambulatory Visit (INDEPENDENT_AMBULATORY_CARE_PROVIDER_SITE_OTHER): Payer: Medicare Other | Admitting: Podiatry

## 2015-11-01 ENCOUNTER — Encounter: Payer: Self-pay | Admitting: Podiatry

## 2015-11-01 ENCOUNTER — Ambulatory Visit (INDEPENDENT_AMBULATORY_CARE_PROVIDER_SITE_OTHER): Payer: Medicare Other

## 2015-11-01 ENCOUNTER — Other Ambulatory Visit: Payer: Self-pay | Admitting: Cardiology

## 2015-11-01 VITALS — BP 167/89 | HR 58 | Resp 16 | Ht 66.0 in | Wt 213.0 lb

## 2015-11-01 DIAGNOSIS — M79673 Pain in unspecified foot: Secondary | ICD-10-CM

## 2015-11-01 DIAGNOSIS — E119 Type 2 diabetes mellitus without complications: Secondary | ICD-10-CM

## 2015-11-01 DIAGNOSIS — B351 Tinea unguium: Secondary | ICD-10-CM | POA: Diagnosis not present

## 2015-11-01 DIAGNOSIS — M79676 Pain in unspecified toe(s): Secondary | ICD-10-CM | POA: Diagnosis not present

## 2015-11-01 NOTE — Progress Notes (Signed)
   Subjective:    Patient ID: Hunter [name], male    DOB: 1950/05/03, 65 y.o.   MRN: [id number]  HPI: He presents today with chief complaint of redness and discoloration to the hallux nail of the right foot. Was told by his primary care provider that causes hurts and is swollen and red and he needs to be seen. He states that his hemoglobin A1c is 6.8 and his blood sugar runs about 150 on average. He states that he had peripheral vascular studies done recently. He is basically paralyzed with little ability to walk or stand due to a spinal cord lesion and subsequent surgery.    Review of Systems  Constitutional: Positive for fatigue.  HENT: Positive for tinnitus.   Respiratory: Positive for shortness of breath.   Cardiovascular: Positive for leg swelling.  Musculoskeletal: Positive for arthralgias, back pain and myalgias.  Neurological: Positive for weakness and numbness.  All other systems reviewed and are negative.      Objective:   Physical Exam: Eitel signs are stable is alert and oriented 3 pulses are nonpalpable. Feet are cool to the touch I am unable to find recent ABIs in the computer system was I did read one of the notes provided by the vascular department which states that 0.6 was his last ABI was holding steady. Hallux right demonstrates a thick mycotic nail as do the remainder of the nails. The hallux right does demonstrate some mild erythema no cellulitis drainage or odor and does not appear to be clinically infected. He has no significant muscle strength dorsiflexion plantar flexion inversion or eversion he has no reproducible tendon reflexes. No open lesions or wounds. Painful mycotic nails.        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  Plan: Debrided all reactive hyperkeratoses and debrided toenails 1 through 5.

## 2015-11-10 ENCOUNTER — Encounter (HOSPITAL_COMMUNITY): Payer: Self-pay | Admitting: *Deleted

## 2015-11-10 NOTE — Progress Notes (Signed)
Consulted Dr. Orene Desanctis about EKG from 01/20/2015, and Dr. Orene Desanctis states OK for procedure.

## 2015-11-21 ENCOUNTER — Encounter (HOSPITAL_COMMUNITY): Payer: Self-pay | Admitting: *Deleted

## 2015-11-21 ENCOUNTER — Ambulatory Visit (HOSPITAL_COMMUNITY): Payer: Medicare Other | Admitting: Anesthesiology

## 2015-11-21 ENCOUNTER — Ambulatory Visit (HOSPITAL_COMMUNITY)
Admission: RE | Admit: 2015-11-21 | Discharge: 2015-11-21 | Disposition: A | Discharge status: Home / Self Care | Payer: Medicare Other | Source: Ambulatory Visit | Attending: Gastroenterology | Admitting: Gastroenterology

## 2015-11-21 ENCOUNTER — Encounter (HOSPITAL_COMMUNITY)
Admission: RE | Disposition: A | Discharge status: Home / Self Care | Payer: Self-pay | Source: Ambulatory Visit | Attending: Gastroenterology

## 2015-11-21 DIAGNOSIS — Z951 Presence of aortocoronary bypass graft: Secondary | ICD-10-CM | POA: Insufficient documentation

## 2015-11-21 DIAGNOSIS — N3281 Overactive bladder: Secondary | ICD-10-CM | POA: Diagnosis not present

## 2015-11-21 DIAGNOSIS — Z955 Presence of coronary angioplasty implant and graft: Secondary | ICD-10-CM | POA: Insufficient documentation

## 2015-11-21 DIAGNOSIS — I2510 Atherosclerotic heart disease of native coronary artery without angina pectoris: Secondary | ICD-10-CM | POA: Diagnosis not present

## 2015-11-21 DIAGNOSIS — Z885 Allergy status to narcotic agent status: Secondary | ICD-10-CM | POA: Diagnosis not present

## 2015-11-21 DIAGNOSIS — M21372 Foot drop, left foot: Secondary | ICD-10-CM | POA: Insufficient documentation

## 2015-11-21 DIAGNOSIS — K6289 Other specified diseases of anus and rectum: Secondary | ICD-10-CM

## 2015-11-21 DIAGNOSIS — Z9889 Other specified postprocedural states: Secondary | ICD-10-CM | POA: Diagnosis not present

## 2015-11-21 DIAGNOSIS — Z981 Arthrodesis status: Secondary | ICD-10-CM | POA: Insufficient documentation

## 2015-11-21 DIAGNOSIS — Z9049 Acquired absence of other specified parts of digestive tract: Secondary | ICD-10-CM | POA: Insufficient documentation

## 2015-11-21 DIAGNOSIS — K598 Other specified functional intestinal disorders: Secondary | ICD-10-CM | POA: Diagnosis not present

## 2015-11-21 DIAGNOSIS — M21371 Foot drop, right foot: Secondary | ICD-10-CM | POA: Diagnosis not present

## 2015-11-21 DIAGNOSIS — K640 First degree hemorrhoids: Secondary | ICD-10-CM | POA: Insufficient documentation

## 2015-11-21 DIAGNOSIS — Z993 Dependence on wheelchair: Secondary | ICD-10-CM | POA: Insufficient documentation

## 2015-11-21 DIAGNOSIS — G4733 Obstructive sleep apnea (adult) (pediatric): Secondary | ICD-10-CM | POA: Diagnosis not present

## 2015-11-21 DIAGNOSIS — R351 Nocturia: Secondary | ICD-10-CM | POA: Insufficient documentation

## 2015-11-21 DIAGNOSIS — K625 Hemorrhage of anus and rectum: Secondary | ICD-10-CM

## 2015-11-21 DIAGNOSIS — N3941 Urge incontinence: Secondary | ICD-10-CM | POA: Diagnosis not present

## 2015-11-21 DIAGNOSIS — J449 Chronic obstructive pulmonary disease, unspecified: Secondary | ICD-10-CM | POA: Insufficient documentation

## 2015-11-21 DIAGNOSIS — Z91048 Other nonmedicinal substance allergy status: Secondary | ICD-10-CM | POA: Insufficient documentation

## 2015-11-21 DIAGNOSIS — K219 Gastro-esophageal reflux disease without esophagitis: Secondary | ICD-10-CM | POA: Insufficient documentation

## 2015-11-21 DIAGNOSIS — Z887 Allergy status to serum and vaccine status: Secondary | ICD-10-CM | POA: Insufficient documentation

## 2015-11-21 DIAGNOSIS — I10 Essential (primary) hypertension: Secondary | ICD-10-CM | POA: Insufficient documentation

## 2015-11-21 DIAGNOSIS — E1151 Type 2 diabetes mellitus with diabetic peripheral angiopathy without gangrene: Secondary | ICD-10-CM | POA: Diagnosis not present

## 2015-11-21 DIAGNOSIS — Z79899 Other long term (current) drug therapy: Secondary | ICD-10-CM | POA: Insufficient documentation

## 2015-11-21 DIAGNOSIS — I872 Venous insufficiency (chronic) (peripheral): Secondary | ICD-10-CM | POA: Insufficient documentation

## 2015-11-21 DIAGNOSIS — I252 Old myocardial infarction: Secondary | ICD-10-CM | POA: Diagnosis not present

## 2015-11-21 DIAGNOSIS — Z6834 Body mass index (BMI) 34.0-34.9, adult: Secondary | ICD-10-CM | POA: Insufficient documentation

## 2015-11-21 HISTORY — PX: COLONOSCOPY WITH PROPOFOL: SHX5780

## 2015-11-21 LAB — POCT I-STAT EG7
Acid-base deficit: 1 mmol/L (ref 0.0–2.0)
Bicarbonate: 24.3 mmol/L (ref 20.0–28.0)
CALCIUM ION: 1.17 mmol/L (ref 1.15–1.40)
HEMATOCRIT: 45 % (ref 39.0–52.0)
HEMOGLOBIN: 15.3 g/dL (ref 13.0–17.0)
O2 Saturation: 76 %
PCO2 VEN: 40.3 mmHg — AB (ref 44.0–60.0)
POTASSIUM: 3.6 mmol/L (ref 3.5–5.1)
SODIUM: 140 mmol/L (ref 135–145)
TCO2: 25 mmol/L (ref 0–100)
pH, Ven: 7.388 (ref 7.250–7.430)
pO2, Ven: 42 mmHg (ref 32.0–45.0)

## 2015-11-21 SURGERY — COLONOSCOPY WITH PROPOFOL
Anesthesia: General

## 2015-11-21 MED ORDER — LACTATED RINGERS IV SOLN
INTRAVENOUS | Status: DC
  Administered 2015-11-21: 08:00:00 via INTRAVENOUS

## 2015-11-21 MED ORDER — PROPOFOL 500 MG/50ML IV EMUL
INTRAVENOUS | Status: DC | PRN
  Administered 2015-11-21: 100 ug/kg/min via INTRAVENOUS

## 2015-11-21 MED ORDER — PROPOFOL 500 MG/50ML IV EMUL
INTRAVENOUS | Status: DC | PRN
  Administered 2015-11-21: 30 mg via INTRAVENOUS
  Administered 2015-11-21: 50 mg via INTRAVENOUS

## 2015-11-21 MED ORDER — SODIUM CHLORIDE 0.9 % IV SOLN: INTRAVENOUS | Status: DC

## 2015-11-21 MED ORDER — PROPOFOL 10 MG/ML IV BOLUS
INTRAVENOUS | Status: AC
  Filled 2015-11-21: qty 40

## 2015-11-21 SURGICAL SUPPLY — 22 items

## 2015-11-21 NOTE — Anesthesia Postprocedure Evaluation (Signed)
Anesthesia Post Note  Patient: Hunter [name]  Procedure(s) Performed: Procedure(s) (LRB): COLONOSCOPY WITH PROPOFOL (N/A)  Patient location during evaluation: Endoscopy Anesthesia Type: MAC Level of consciousness: awake and alert Pain management: pain level controlled Vital Signs Assessment: post-procedure vital signs reviewed and stable Respiratory status: spontaneous breathing, nonlabored ventilation, respiratory function stable and patient connected to nasal cannula oxygen Cardiovascular status: stable and blood pressure returned to baseline Anesthetic complications: no    Last Vitals:  Vitals:   11/21/15 0840 11/21/15 0850  BP: (!) 135/34 (!) 160/49  Pulse:    Resp:    Temp:      Last Pain:  Vitals:   11/21/15 0838  TempSrc: Oral                 [name],JAMES TERRILL

## 2015-11-21 NOTE — Op Note (Signed)
[facility] Patient Name: Hunter [name] Procedure Date: 11/21/2015 MRN: [id number] Attending MD: Estill Cotta. Loletha Carrow , MD Date of Birth: 04/26/1950 CSN: [id number] Age: 65 Admit Type: Outpatient Procedure:                Colonoscopy Indications:              Rectal bleeding Providers:                Mallie Mussel L. Loletha Carrow, MD, Hilma Favors, RN, Alfonso Patten,                            Technician, Arnoldo Hooker, CRNA Referring MD:              Medicines:                Monitored Anesthesia Care Complications:            No immediate complications. Estimated Blood Loss:     Estimated blood loss: none. Procedure:                Pre-Anesthesia Assessment:                           - Prior to the procedure, a History and Physical                            was performed, and patient medications and                            allergies were reviewed. The patient's tolerance of                            previous anesthesia was also reviewed. The risks                            and benefits of the procedure and the sedation                            options and risks were discussed with the patient.                            All questions were answered, and informed consent                            was obtained. Prior Anticoagulants: The patient has                            taken Plavix (clopidogrel), last dose was 5 days                            prior to procedure. ASA Grade Assessment: III - A                            patient with severe systemic disease. After  reviewing the risks and benefits, the patient was                            deemed in satisfactory condition to undergo the                            procedure.                           After obtaining informed consent, the colonoscope                            was passed under direct vision. Throughout the                            procedure, the patient's blood pressure, pulse, and                          oxygen saturations were monitored continuously. The                            EC-[id number]) scope was introduced through                            the anus and advanced to the the cecum, identified                            by appendiceal orifice and ileocecal valve. The                            colonoscopy was performed without difficulty. The                            patient tolerated the procedure well. The quality                            of the bowel preparation was excellent. The                            ileocecal valve, appendiceal orifice, and rectum                            were photographed. The quality of the bowel                            preparation was evaluated using the BBPS [facility]                            Bowel Preparation Scale) with scores of: Right                            Colon = 3, Transverse Colon = 3 and Left Colon = 3                            (  entire mucosa seen well with no residual staining,                            small fragments of stool or opaque liquid). The                            total BBPS score equals 9. The bowel preparation                            used was SUPREP. Scope In: 8:19:23 AM Scope Out: 8:31:04 AM Scope Withdrawal Time: 0 hours 8 minutes 17 seconds  Total Procedure Duration: 0 hours 11 minutes 41 seconds  Findings:      The digital rectal exam findings include decreased sphincter tone.      Internal hemorrhoids were found during retroflexion and during anoscopy.       The hemorrhoids were medium-sized and Grade I (internal hemorrhoids that       do not prolapse).      The exam was otherwise without abnormality on direct and retroflexion       views. Impression:               - Decreased sphincter tone found on digital rectal                            exam.                           - Internal hemorrhoids.                           - The examination was otherwise normal on direct                             and retroflexion views.                           - No specimens collected. Moderate Sedation:      MAC sedation used Recommendation:           - Patient has a contact number available for                            emergencies. The signs and symptoms of potential                            delayed complications were discussed with the                            patient. Return to normal activities tomorrow.                            Written discharge instructions were provided to the                            patient.                           -  Resume previous diet.                           - Resume Plavix (clopidogrel) at prior dose today.                           - Repeat colonoscopy in 10 years for screening                            purposes.                           - Preparation H suppository: Insert rectally as                            necessary. Procedure Code(s):        --- Professional ---                           [phone], Colonoscopy, flexible; diagnostic, including                            collection of specimen(s) by brushing or washing,                            when performed (separate procedure) Diagnosis Code(s):        --- Professional ---                           K62.89, Other specified diseases of anus and rectum                           K64.0, First degree hemorrhoids                           K62.5, Hemorrhage of anus and rectum CPT copyright 2016 American Medical Association. All rights reserved. The codes documented in this report are preliminary and upon coder review may  be revised to meet current compliance requirements. [name], MD 11/21/2015 8:35:59 AM This report has been signed electronically. Number of Addenda: 0
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
## 2015-11-21 NOTE — Transfer of Care (Signed)
Immediate Anesthesia Transfer of Care Note  Patient: Hunter [name]  Procedure(s) Performed: Procedure(s): COLONOSCOPY WITH PROPOFOL (N/A)  Patient Location: PACU  Anesthesia Type:MAC  Level of Consciousness:  sedated, patient cooperative and responds to stimulation  Airway & Oxygen Therapy:Patient Spontanous Breathing and Patient connected to face mask oxgen  Post-op Assessment:  Report given to PACU RN and Post -op Vital signs reviewed and stable  Post vital signs:  Reviewed and stable  Last Vitals:  Vitals:   11/21/15 0811  BP: (!) 178/63  Pulse: 67  Resp: 14  Temp: [id number] C    Complications: No apparent anesthesia complications

## 2015-11-21 NOTE — Anesthesia Preprocedure Evaluation (Signed)
Anesthesia Evaluation  Patient identified by MRN, date of birth, ID band Patient awake    Reviewed: Allergy & Precautions, NPO status , Patient's Chart, lab work & pertinent test results  Airway Mallampati: III   Neck ROM: Limited  Mouth opening: Limited Mouth Opening  Dental  (+) Teeth Intact, Dental Advisory Given   Pulmonary sleep apnea , COPD, former smoker,    breath sounds clear to auscultation       Cardiovascular hypertension, + CAD, + Past MI and + Peripheral Vascular Disease   Rhythm:Regular Rate:Normal     Neuro/Psych  Neuromuscular disease    GI/Hepatic GERD  ,  Endo/Other  diabetesMorbid obesity  Renal/GU      Musculoskeletal   Abdominal (+) + obese,   Peds  Hematology   Anesthesia Other Findings   Reproductive/Obstetrics                             Anesthesia Physical Anesthesia Plan  ASA: III  Anesthesia Plan: General   Post-op Pain Management:    Induction: Intravenous  Airway Management Planned: Natural Airway and Simple Face Mask  Additional Equipment:   Intra-op Plan:   Post-operative Plan: Extubation in OR  Informed Consent: I have reviewed the patients History and Physical, chart, labs and discussed the procedure including the risks, benefits and alternatives for the proposed anesthesia with the patient or authorized representative who has indicated his/her understanding and acceptance.   Dental advisory given  Plan Discussed with: CRNA  Anesthesia Plan Comments:         Anesthesia Quick Evaluation

## 2015-11-21 NOTE — Interval H&P Note (Signed)
History and Physical Interval Note:  11/21/2015 8:09 AM  Hunter [name]  has presented today for surgery, with the diagnosis of rectal bleeding  The various methods of treatment have been discussed with the patient and family. After consideration of risks, benefits and other options for treatment, the patient has consented to  Procedure(s): COLONOSCOPY WITH PROPOFOL (N/A) as a surgical intervention .  The patient's history has been reviewed, patient examined, no change in status, stable for surgery.  I have reviewed the patient's chart and labs.  Questions were answered to the patient's satisfaction.     Nelida Meuse III

## 2015-11-21 NOTE — Discharge Instructions (Signed)

## 2015-11-22 ENCOUNTER — Encounter (HOSPITAL_COMMUNITY): Payer: Self-pay | Admitting: Gastroenterology

## 2015-12-06 NOTE — Addendum Note (Signed)
Addended by: Lianne Cure A on: 12/06/2015 09:10 AM   Modules accepted: Orders

## 2015-12-07 ENCOUNTER — Telehealth: Payer: Self-pay | Admitting: Cardiology

## 2015-12-07 NOTE — Telephone Encounter (Signed)
I have attempted to call the # listed multiple times without success.  Will continue to attempt to reach.

## 2015-12-07 NOTE — Telephone Encounter (Signed)
New message  Please return call to [facility] w/[employer]  Calling to see if we have received a document that needs to be signed and returned

## 2015-12-12 NOTE — Telephone Encounter (Signed)
Spoke with person at [facility]) as listed in telephone note.  She does not know why she called other than to determine if we by chance had received paperwork from send for Dr [name] to sign.  Advised I have seen no paperwork from [facility] re: this patient.  She will f/u or call back if necessary.

## 2015-12-21 ENCOUNTER — Telehealth: Payer: Self-pay | Admitting: Cardiology

## 2015-12-21 NOTE — Telephone Encounter (Signed)
New Message  Hunter [name] voiced there was a referral sent to Hunter [name] and they're needing form filled out and faxed back. Ref#: [id number]  Please f/u

## 2016-01-31 ENCOUNTER — Ambulatory Visit: Payer: Medicare Other | Admitting: Podiatry

## 2016-02-09 ENCOUNTER — Ambulatory Visit (INDEPENDENT_AMBULATORY_CARE_PROVIDER_SITE_OTHER): Payer: Medicare Other | Admitting: Podiatry

## 2016-02-09 ENCOUNTER — Encounter: Payer: Self-pay | Admitting: Podiatry

## 2016-02-09 DIAGNOSIS — B351 Tinea unguium: Secondary | ICD-10-CM | POA: Diagnosis not present

## 2016-02-09 DIAGNOSIS — M79676 Pain in unspecified toe(s): Secondary | ICD-10-CM

## 2016-02-09 DIAGNOSIS — E1151 Type 2 diabetes mellitus with diabetic peripheral angiopathy without gangrene: Secondary | ICD-10-CM

## 2016-02-12 NOTE — Progress Notes (Signed)
Presents today chief complaint of painful elongated toenails.  Objective: Vital signs are stable oriented 3 pulses are minimally palpable. Neurologic sensorium is slightly diminished. Toenails are long thick yellow dystrophic with mycotic no open lesions or wounds are noted.  Assessment: Pain in limb secondary to onychomycosis.  Plan: Debridement tenderness 1 through 5 bilateral. Follow up with [place] in 3 months.

## 2016-03-23 ENCOUNTER — Encounter: Payer: Self-pay | Admitting: Family

## 2016-03-29 ENCOUNTER — Ambulatory Visit (INDEPENDENT_AMBULATORY_CARE_PROVIDER_SITE_OTHER): Payer: Medicare Other | Admitting: Family

## 2016-03-29 ENCOUNTER — Ambulatory Visit (HOSPITAL_COMMUNITY)
Admission: RE | Admit: 2016-03-29 | Discharge: 2016-03-29 | Disposition: A | Discharge status: Home / Self Care | Payer: Medicare Other | Source: Ambulatory Visit | Attending: Family | Admitting: Family

## 2016-03-29 ENCOUNTER — Encounter: Payer: Self-pay | Admitting: Family

## 2016-03-29 VITALS — BP 149/67 | HR 64 | Temp 99.2°F | Resp 20 | Ht 66.0 in | Wt 222.0 lb

## 2016-03-29 DIAGNOSIS — E1151 Type 2 diabetes mellitus with diabetic peripheral angiopathy without gangrene: Secondary | ICD-10-CM

## 2016-03-29 DIAGNOSIS — I872 Venous insufficiency (chronic) (peripheral): Secondary | ICD-10-CM

## 2016-03-29 DIAGNOSIS — Z87891 Personal history of nicotine dependence: Secondary | ICD-10-CM

## 2016-03-29 DIAGNOSIS — I779 Disorder of arteries and arterioles, unspecified: Secondary | ICD-10-CM | POA: Diagnosis present

## 2016-03-29 DIAGNOSIS — E669 Obesity, unspecified: Secondary | ICD-10-CM | POA: Diagnosis not present

## 2016-03-29 DIAGNOSIS — I739 Peripheral vascular disease, unspecified: Secondary | ICD-10-CM | POA: Insufficient documentation

## 2016-03-29 NOTE — Patient Instructions (Addendum)
Peripheral Vascular Disease Peripheral vascular disease (PVD) is a disease of the blood vessels that are not part of your heart and brain. A simple term for PVD is poor circulation. In most cases, PVD narrows the blood vessels that carry blood from your heart to the rest of your body. This can result in a decreased supply of blood to your arms, legs, and internal organs, like your stomach or kidneys. However, it most often affects a person's lower legs and feet. There are two types of PVD.  Organic PVD. This is the more common type. It is caused by damage to the structure of blood vessels.  Functional PVD. This is caused by conditions that make blood vessels contract and tighten (spasm).  Without treatment, PVD tends to get worse over time. PVD can also lead to acute ischemic limb. This is when an arm or limb suddenly has trouble getting enough blood. This is a medical emergency. Follow these instructions at home:  Take medicines only as told by your doctor.  Do not use any tobacco products, including cigarettes, chewing tobacco, or electronic cigarettes. If you need help quitting, ask your doctor.  Lose weight if you are overweight, and maintain a healthy weight as told by your doctor.  Eat a diet that is low in fat and cholesterol. If you need help, ask your doctor.  Exercise regularly. Ask your doctor for some good activities for you.  Take good care of your feet. ? Wear comfortable shoes that fit well. ? Check your feet often for any cuts or sores. Contact a doctor if:  You have cramps in your legs while walking.  You have leg pain when you are at rest.  You have coldness in a leg or foot.  Your skin changes.  You are unable to get or have an erection (erectile dysfunction).  You have cuts or sores on your feet that are not healing. Get help right away if:  Your arm or leg turns cold and blue.  Your arms or legs become red, warm, swollen, painful, or numb.  You have  chest pain or trouble breathing.  You suddenly have weakness in your face, arm, or leg.  You become very confused or you cannot speak.  You suddenly have a very bad headache.  You suddenly cannot see. This information is not intended to replace advice given to you by your health care provider. Make sure you discuss any questions you have with your health care provider. Document Released: 03/14/2009 Document Revised: 05/26/2015 Document Reviewed: 05/28/2013 Elsevier Interactive Patient Education  2017 [employer].      Chronic Venous Insufficiency Chronic venous insufficiency, also called venous stasis, is a condition that prevents blood from being pumped effectively through the veins in your legs. Blood may no longer be pumped effectively from the legs back to the heart. This condition can range from mild to severe. With proper treatment, you should be able to continue with an active life. What are the causes? Chronic venous insufficiency occurs when the vein walls become stretched, weakened, or damaged, or when valves within the vein are damaged. Some common causes of this include:  High blood pressure inside the veins (venous hypertension).  Increased blood pressure in the leg veins from long periods of sitting or standing.  A blood clot that blocks blood flow in a vein (deep vein thrombosis, DVT).  Inflammation of a vein (phlebitis) that causes a blood clot to form.  Tumors in the pelvis that cause blood   to back up.  What increases the risk? The following factors may make you more likely to develop this condition:  Having a family history of this condition.  Obesity.  Pregnancy.  Living without enough physical activity or exercise (sedentary lifestyle).  Smoking.  Having a job that requires long periods of standing or sitting in one place.  Being a certain age. Women in their 40s and 50s and men in their 70s are more likely to develop this condition.  What are  the signs or symptoms? Symptoms of this condition include:  Veins that are enlarged, bulging, or twisted (varicose veins).  Skin breakdown or ulcers.  Reddened or discolored skin on the front of the leg.  Brown, smooth, tight, and painful skin just above the ankle, usually on the inside of the leg (lipodermatosclerosis).  Swelling.  How is this diagnosed? This condition may be diagnosed based on:  Your medical history.  A physical exam.  Tests, such as: ? A procedure that creates an image of a blood vessel and nearby organs and provides information about blood flow through the blood vessel (duplex ultrasound). ? A procedure that tests blood flow (plethysmography). ? A procedure to look at the veins using X-ray and dye (venogram).  How is this treated? The goals of treatment are to help you return to an active life and to minimize pain or disability. Treatment depends on the severity of your condition, and it may include:  Wearing compression stockings. These can help relieve symptoms and help prevent your condition from getting worse. However, they do not cure the condition.  Sclerotherapy. This is a procedure involving an injection of a material that "dissolves" damaged veins.  Surgery. This may involve: ? Removing a diseased vein (vein stripping). ? Cutting off blood flow through the vein (laser ablation surgery). ? Repairing a valve.  Follow these instructions at home:  Wear compression stockings as told by your health care provider. These stockings help to prevent blood clots and reduce swelling in your legs.  Take over-the-counter and prescription medicines only as told by your health care provider.  Stay active by exercising, walking, or doing different activities. Ask your health care provider what activities are safe for you and how much exercise you need.  Drink enough fluid to keep your urine clear or pale yellow.  Do not use any products that contain nicotine  or tobacco, such as cigarettes and e-cigarettes. If you need help quitting, ask your health care provider.  Keep all follow-up visits as told by your health care provider. This is important. Contact a health care provider if:  You have redness, swelling, or more pain in the affected area.  You see a red streak or line that extends up or down from the affected area.  You have skin breakdown or a loss of skin in the affected area, even if the breakdown is small.  You get an injury in the affected area. Get help right away if:  You get an injury and an open wound in the affected area.  You have severe pain that does not get better with medicine.  You have sudden numbness or weakness in the foot or ankle below the affected area, or you have trouble moving your foot or ankle.  You have a fever and you have worse or persistent symptoms.  You have chest pain.  You have shortness of breath. Summary  Chronic venous insufficiency, also called venous stasis, is a condition that prevents blood from being   pumped effectively through the veins in your legs.  Chronic venous insufficiency occurs when the vein walls become stretched, weakened, or damaged, or when valves within the vein are damaged.  Treatment for this condition depends on how severe your condition is, and it may involve wearing compression stockings or having a procedure.  Make sure you stay active by exercising, walking, or doing different activities. Ask your health care provider what activities are safe for you and how much exercise you need. This information is not intended to replace advice given to you by your health care provider. Make sure you discuss any questions you have with your health care provider. Document Released: 04/23/2006 Document Revised: 11/07/2015 Document Reviewed: 11/07/2015 Elsevier Interactive Patient Education  2017 [employer].  
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
## 2016-04-29 ENCOUNTER — Other Ambulatory Visit: Payer: Self-pay | Admitting: Cardiology

## 2016-05-08 ENCOUNTER — Ambulatory Visit (INDEPENDENT_AMBULATORY_CARE_PROVIDER_SITE_OTHER): Payer: Medicare Other | Admitting: Podiatry

## 2016-05-08 ENCOUNTER — Encounter: Payer: Self-pay | Admitting: Podiatry

## 2016-05-08 DIAGNOSIS — E1151 Type 2 diabetes mellitus with diabetic peripheral angiopathy without gangrene: Secondary | ICD-10-CM | POA: Diagnosis not present

## 2016-05-08 DIAGNOSIS — B351 Tinea unguium: Secondary | ICD-10-CM

## 2016-05-08 DIAGNOSIS — M79676 Pain in unspecified toe(s): Secondary | ICD-10-CM

## 2016-05-08 NOTE — Progress Notes (Signed)
He presents today for chief complaint of painful elongated toenails.  Objective: Nails are long thick yellow dystrophic onychomycotic no open lesions or wounds are noted. Pulses are nonpalpable bilateral capillary fill time is immediate. Neurologic sensorium is diminished.  Assessment: Peripheral vascular disease with peripheral neuropathy painful elongated toenails bilateral.  Plan: Debridement of toenails bilateral.

## 2016-05-29 ENCOUNTER — Other Ambulatory Visit: Payer: Self-pay | Admitting: Cardiology

## 2016-05-30 ENCOUNTER — Other Ambulatory Visit: Payer: Self-pay | Admitting: Cardiology

## 2016-07-02 ENCOUNTER — Other Ambulatory Visit: Payer: Self-pay | Admitting: Cardiology

## 2016-07-06 DIAGNOSIS — G8220 Paraplegia, unspecified: Secondary | ICD-10-CM | POA: Insufficient documentation

## 2016-07-19 ENCOUNTER — Other Ambulatory Visit: Payer: Self-pay | Admitting: Cardiology

## 2016-07-30 ENCOUNTER — Ambulatory Visit (INDEPENDENT_AMBULATORY_CARE_PROVIDER_SITE_OTHER): Payer: Medicare Other | Admitting: Cardiology

## 2016-07-30 ENCOUNTER — Encounter (INDEPENDENT_AMBULATORY_CARE_PROVIDER_SITE_OTHER): Payer: Self-pay

## 2016-07-30 ENCOUNTER — Encounter: Payer: Self-pay | Admitting: Cardiology

## 2016-07-30 VITALS — BP 110/60 | HR 59 | Ht 66.0 in | Wt 216.0 lb

## 2016-07-30 DIAGNOSIS — I739 Peripheral vascular disease, unspecified: Secondary | ICD-10-CM | POA: Diagnosis not present

## 2016-07-30 DIAGNOSIS — I259 Chronic ischemic heart disease, unspecified: Secondary | ICD-10-CM

## 2016-07-30 DIAGNOSIS — J438 Other emphysema: Secondary | ICD-10-CM | POA: Diagnosis not present

## 2016-07-30 DIAGNOSIS — E1151 Type 2 diabetes mellitus with diabetic peripheral angiopathy without gangrene: Secondary | ICD-10-CM

## 2016-07-30 DIAGNOSIS — Z9889 Other specified postprocedural states: Secondary | ICD-10-CM

## 2016-07-30 MED ORDER — NITROGLYCERIN 0.4 MG SL SUBL: 0.4000 mg | SUBLINGUAL_TABLET | SUBLINGUAL | 99 refills | Status: DC | PRN

## 2016-07-30 MED ORDER — ATORVASTATIN CALCIUM 40 MG PO TABS: ORAL_TABLET | ORAL | 11 refills | Status: DC

## 2016-07-30 MED ORDER — DOXAZOSIN MESYLATE 2 MG PO TABS: 2.0000 mg | ORAL_TABLET | Freq: Every day | ORAL | 11 refills | Status: DC

## 2016-07-30 MED ORDER — CLOPIDOGREL BISULFATE 75 MG PO TABS: 75.0000 mg | ORAL_TABLET | Freq: Every day | ORAL | 11 refills | Status: DC

## 2016-07-30 MED ORDER — METOPROLOL TARTRATE 50 MG PO TABS: 50.0000 mg | ORAL_TABLET | Freq: Two times a day (BID) | ORAL | 11 refills | Status: DC

## 2016-07-30 NOTE — Patient Instructions (Signed)
Medication Instructions:  Stay off Atorvastatin for another week and then restart if no improvement in memory is noted. The current medical regimen is effective;  continue present plan and medications.  Follow-Up: Follow up in 1 year with Dr. [name].  You will receive a letter in the mail 2 months before you are due.  Please call us when you receive this letter to schedule your follow up appointment.  If you need a refill on your cardiac medications before your next appointment, please call your pharmacy.  Thank you for choosing [facility]!!
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
## 2016-07-31 ENCOUNTER — Telehealth: Payer: Self-pay | Admitting: Gastroenterology

## 2016-07-31 NOTE — Telephone Encounter (Signed)
Spoke to patient let him know that Dr. Loletha Carrow did review the routine stool test results and of his recommendations re: not needing colonoscopy at this time. Patient reassured and feels comfortable with this plan given that recent colonoscopy showed no polyps.

## 2016-07-31 NOTE — Telephone Encounter (Signed)
I rec'd a fax from his PCP that a routine stool test was positive for blood, and I assume they told him that.  He just had a colonoscopy with me last November, at which time the preparation was excellent and no polyps were seen. So I think this is most likely a false positive stool test, which is common in patients on both aspirin and plavix.  He does not need to have another colonoscopy at this time.  Really, there is no need for him to have a routine annual stool test to check for blood until he is at least 5 years out from the last colonoscopy.

## 2016-08-07 ENCOUNTER — Encounter: Payer: Self-pay | Admitting: Podiatry

## 2016-08-07 ENCOUNTER — Ambulatory Visit (INDEPENDENT_AMBULATORY_CARE_PROVIDER_SITE_OTHER): Payer: Medicare Other | Admitting: Podiatry

## 2016-08-07 DIAGNOSIS — H6120 Impacted cerumen, unspecified ear: Secondary | ICD-10-CM | POA: Insufficient documentation

## 2016-08-07 DIAGNOSIS — M79676 Pain in unspecified toe(s): Secondary | ICD-10-CM | POA: Diagnosis not present

## 2016-08-07 DIAGNOSIS — R760 Raised antibody titer: Secondary | ICD-10-CM | POA: Insufficient documentation

## 2016-08-07 DIAGNOSIS — B351 Tinea unguium: Secondary | ICD-10-CM

## 2016-08-07 DIAGNOSIS — E1151 Type 2 diabetes mellitus with diabetic peripheral angiopathy without gangrene: Secondary | ICD-10-CM | POA: Diagnosis not present

## 2016-08-07 NOTE — Progress Notes (Signed)
He presents today for follow-up of his toenails his diabetes is under control he says that he had 2 blisters to formal the top of his foot which she applied Neosporin to and they resolved.  Objective: Vital signs are stable he is alert and oriented 3 pulses are minimally palpable bilateral. Capillary fill time is immediate. No open wounds are noted. Toenails are long thick yellow dystrophic and clinically mycotic.  Assessment: Pain in limb secondary to onychomycosis and peripheral neuropathy.  Plan: Debridement of toenails 1 through 5 bilateral.

## 2016-09-11 IMAGING — CT CT NECK W/ CM
4 of 5 series · 16 of 33 positions shown, 18 images · IV contrast (omnipaque)
Comparison: None.

CLINICAL DATA: Left-sided neck pain with swelling, worse with
swallowing or movement for 4 days.

EXAM:
CT NECK WITH CONTRAST
TECHNIQUE: Multidetector CT imaging of the neck was performed using the
standard protocol following the bolus administration of intravenous
contrast.
CONTRAST:  75mL OMNIPAQUE IOHEXOL 300 MG/ML  SOLN

[Series 3: neck 2.0 i31s 3 · axial · 0.49mm/px · z∈[-308,-170]mm · 4 of 116 slices shown, 5 images]
[im 24/116  soft-tissue]
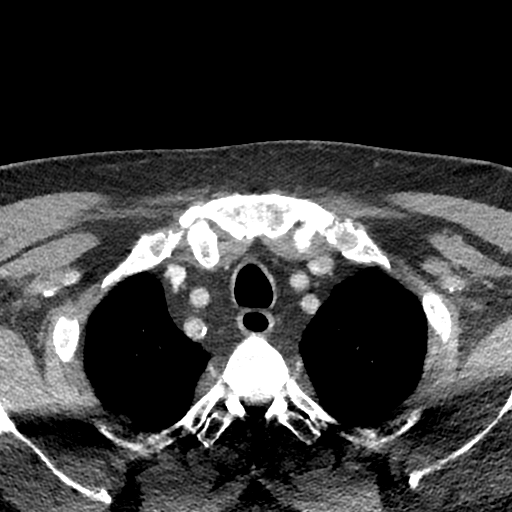
[im 24/116  bone]
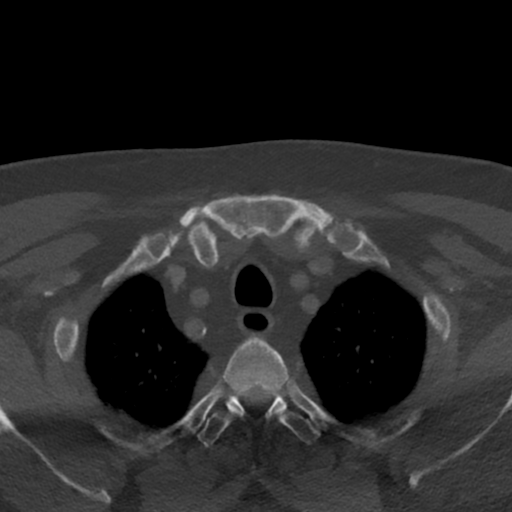
[im 47/116  bone]
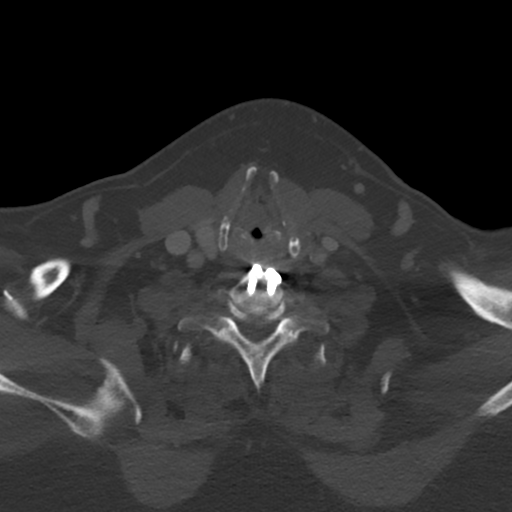
[im 70/116  bone]
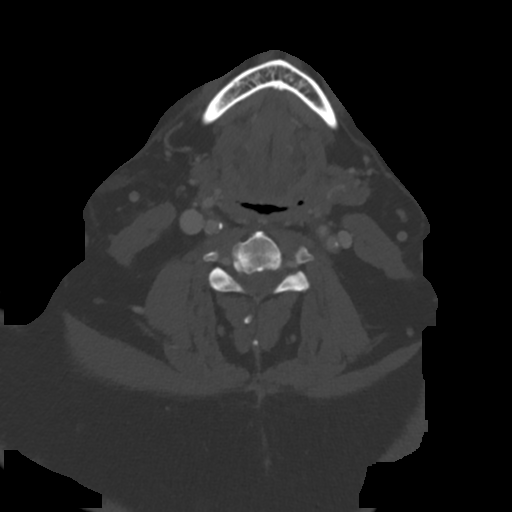
[im 93/116  bone]
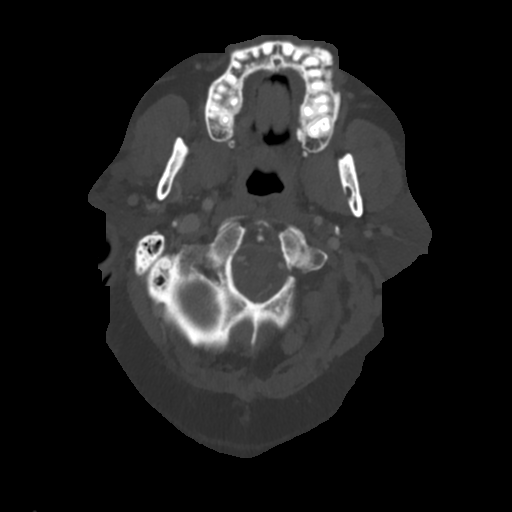

[Series 6: coronal st · coronal · 0.50mm/px · 3 of 101 slices shown]
[im 27/101  bone]
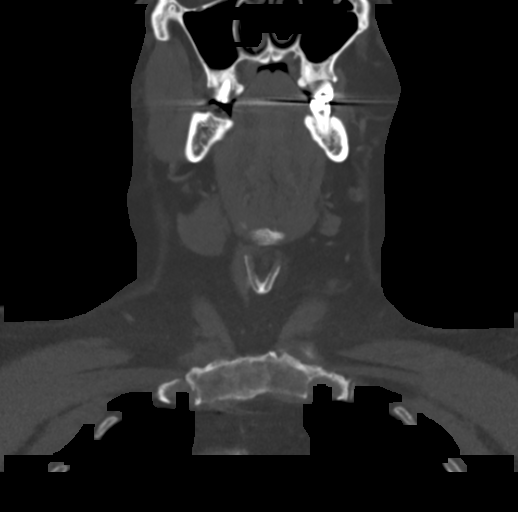
[im 43/101  bone]
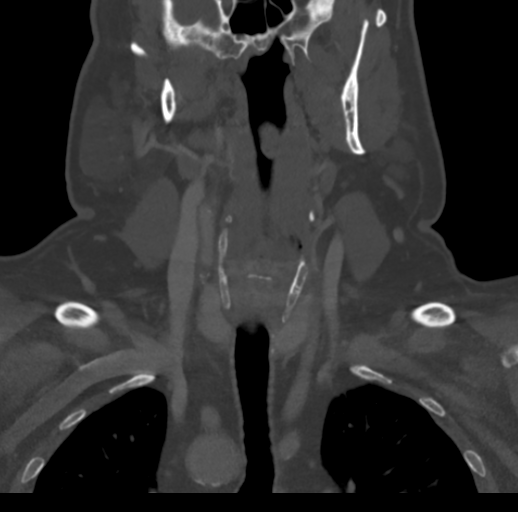
[im 58/101  bone]
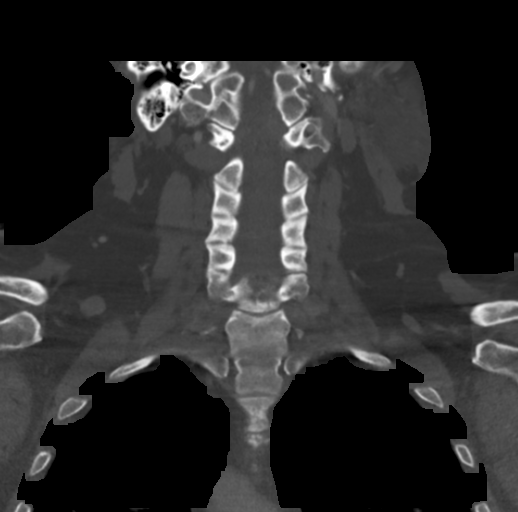

[Series 7: sagittal st · sagittal · 0.45mm/px · 5 of 116 slices shown, 6 images]
[im 39/116  bone]
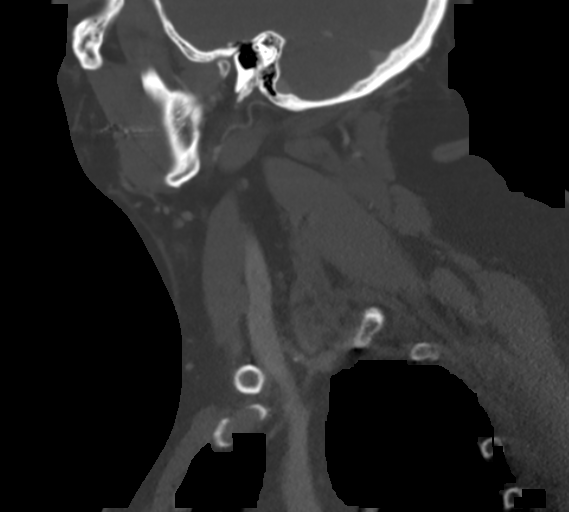
[im 48/116  bone]
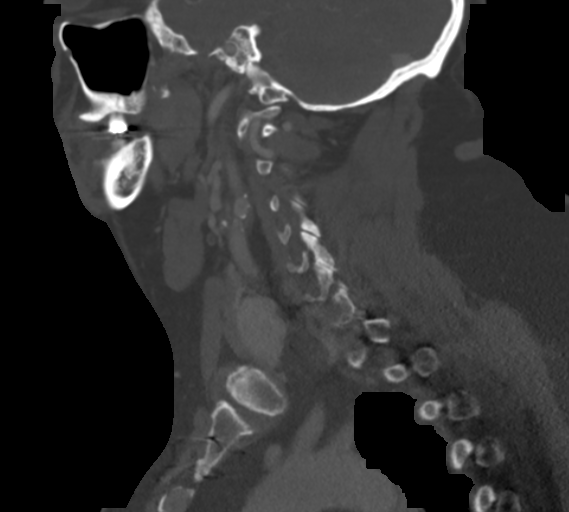
[im 58/116  soft-tissue]
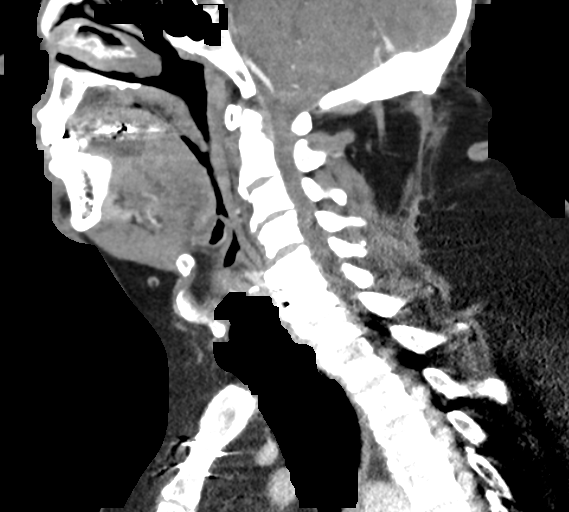
[im 58/116  bone]
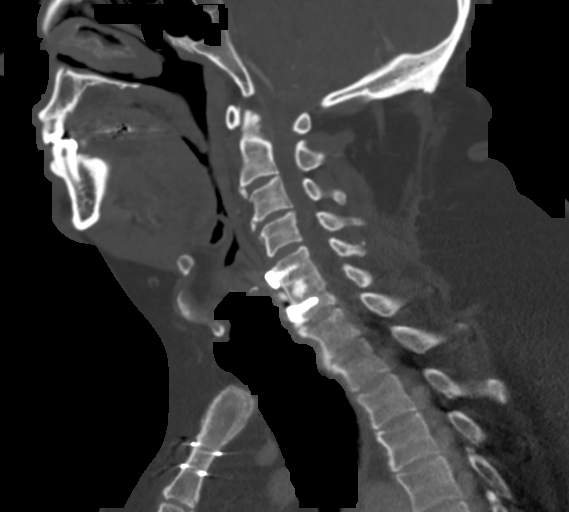
[im 68/116  bone]
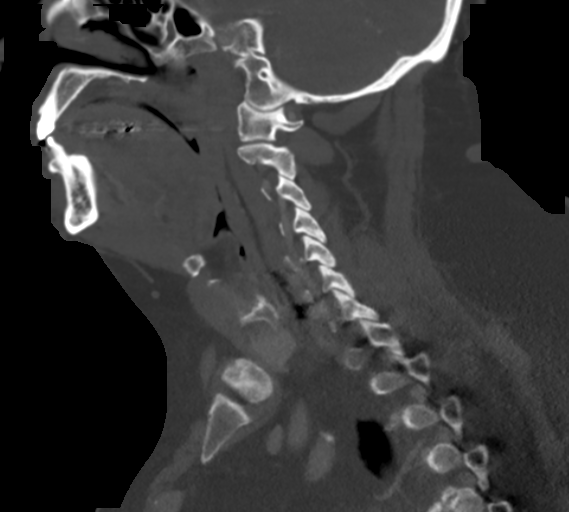
[im 77/116  bone]
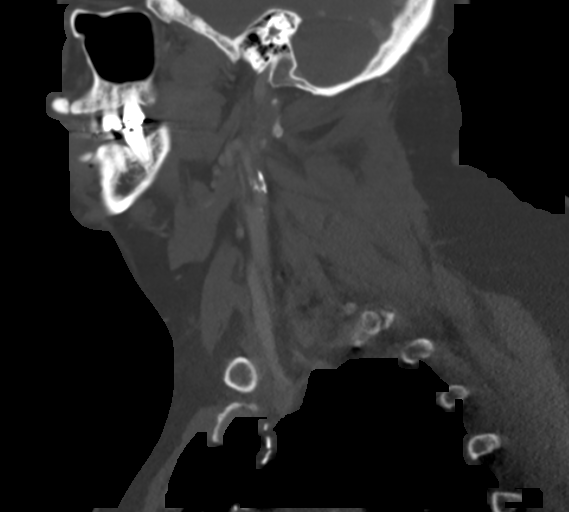

[Series 8: orthogonal st · axial · 0.39mm/px · z∈[-345,-201]mm · 4 of 131 slices shown]
[im 27/131  bone]
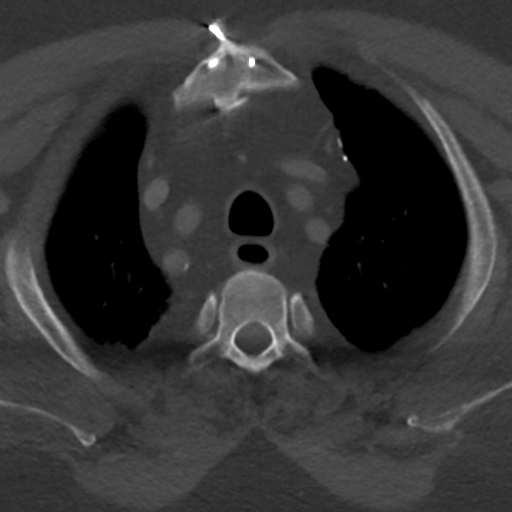
[im 53/131  bone]
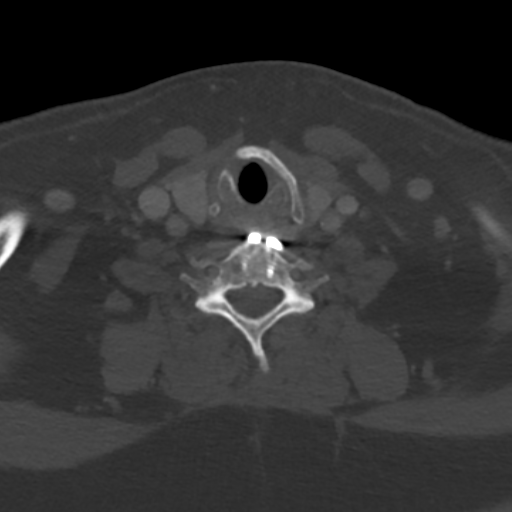
[im 79/131  bone]
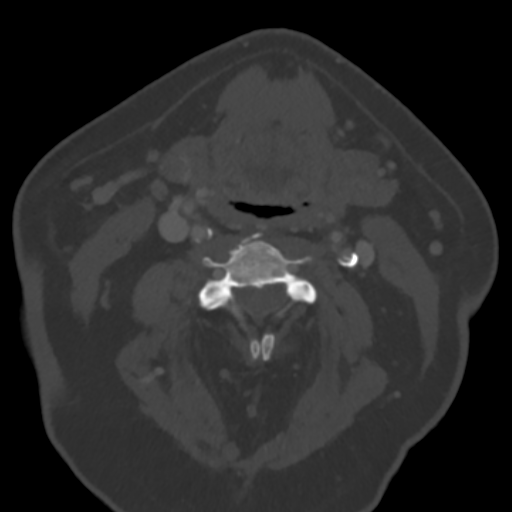
[im 105/131  bone]
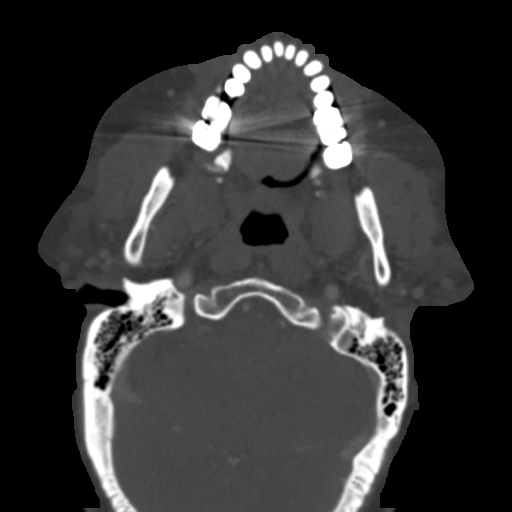

[16 of 33 positions shown; findings below may reference images not displayed]

FINDINGS: No soft tissue mass, fluid collection, or infiltrative changes
identified. Mucosal and prevertebral spaces appear intact. Fat
planes are distinct. No displacement of the cervical carotid or
jugular vessels. Laryngeal structures appear intact. Normal size and
enhancement of the thyroid gland. Cervical lymph nodes are not
pathologically enlarged. Salivary glands appear symmetrical.
Prominent calcification demonstrated in the carotid and internal
carotid arteries bilaterally with probable significant stenosis
suggested bilaterally, greater on the left. Visualize vessels appear
patent. Lung apices are clear. Incidental note of a right-sided
aortic arch.
IMPRESSION: No mass, lymphadenopathy, or abscess demonstrated in the neck.

## 2016-09-27 ENCOUNTER — Other Ambulatory Visit (HOSPITAL_COMMUNITY): Payer: Self-pay | Admitting: Nurse Practitioner

## 2016-09-27 DIAGNOSIS — B182 Chronic viral hepatitis C: Secondary | ICD-10-CM

## 2016-10-10 ENCOUNTER — Other Ambulatory Visit: Payer: Self-pay | Admitting: General Surgery

## 2016-10-10 ENCOUNTER — Other Ambulatory Visit: Payer: Self-pay | Admitting: Radiology

## 2016-10-11 ENCOUNTER — Ambulatory Visit (HOSPITAL_COMMUNITY)
Admission: RE | Admit: 2016-10-11 | Discharge: 2016-10-11 | Disposition: A | Discharge status: Home / Self Care | Payer: Medicare Other | Source: Ambulatory Visit | Attending: Nurse Practitioner | Admitting: Nurse Practitioner

## 2016-10-11 ENCOUNTER — Encounter (HOSPITAL_COMMUNITY): Payer: Self-pay

## 2016-10-11 DIAGNOSIS — Z794 Long term (current) use of insulin: Secondary | ICD-10-CM | POA: Insufficient documentation

## 2016-10-11 DIAGNOSIS — M19012 Primary osteoarthritis, left shoulder: Secondary | ICD-10-CM | POA: Insufficient documentation

## 2016-10-11 DIAGNOSIS — M21371 Foot drop, right foot: Secondary | ICD-10-CM | POA: Diagnosis not present

## 2016-10-11 DIAGNOSIS — G4733 Obstructive sleep apnea (adult) (pediatric): Secondary | ICD-10-CM | POA: Insufficient documentation

## 2016-10-11 DIAGNOSIS — B182 Chronic viral hepatitis C: Secondary | ICD-10-CM | POA: Diagnosis present

## 2016-10-11 DIAGNOSIS — Z951 Presence of aortocoronary bypass graft: Secondary | ICD-10-CM | POA: Insufficient documentation

## 2016-10-11 DIAGNOSIS — N529 Male erectile dysfunction, unspecified: Secondary | ICD-10-CM | POA: Insufficient documentation

## 2016-10-11 DIAGNOSIS — Z888 Allergy status to other drugs, medicaments and biological substances status: Secondary | ICD-10-CM | POA: Insufficient documentation

## 2016-10-11 DIAGNOSIS — Z955 Presence of coronary angioplasty implant and graft: Secondary | ICD-10-CM | POA: Diagnosis not present

## 2016-10-11 DIAGNOSIS — Z981 Arthrodesis status: Secondary | ICD-10-CM | POA: Diagnosis not present

## 2016-10-11 DIAGNOSIS — I872 Venous insufficiency (chronic) (peripheral): Secondary | ICD-10-CM | POA: Insufficient documentation

## 2016-10-11 DIAGNOSIS — K740 Hepatic fibrosis: Secondary | ICD-10-CM | POA: Insufficient documentation

## 2016-10-11 DIAGNOSIS — M21372 Foot drop, left foot: Secondary | ICD-10-CM | POA: Diagnosis not present

## 2016-10-11 DIAGNOSIS — Z79899 Other long term (current) drug therapy: Secondary | ICD-10-CM | POA: Insufficient documentation

## 2016-10-11 DIAGNOSIS — J449 Chronic obstructive pulmonary disease, unspecified: Secondary | ICD-10-CM | POA: Diagnosis not present

## 2016-10-11 DIAGNOSIS — Z7902 Long term (current) use of antithrombotics/antiplatelets: Secondary | ICD-10-CM | POA: Insufficient documentation

## 2016-10-11 DIAGNOSIS — N3941 Urge incontinence: Secondary | ICD-10-CM | POA: Diagnosis not present

## 2016-10-11 DIAGNOSIS — I2510 Atherosclerotic heart disease of native coronary artery without angina pectoris: Secondary | ICD-10-CM | POA: Diagnosis not present

## 2016-10-11 DIAGNOSIS — Z7982 Long term (current) use of aspirin: Secondary | ICD-10-CM | POA: Insufficient documentation

## 2016-10-11 DIAGNOSIS — I10 Essential (primary) hypertension: Secondary | ICD-10-CM | POA: Insufficient documentation

## 2016-10-11 DIAGNOSIS — K219 Gastro-esophageal reflux disease without esophagitis: Secondary | ICD-10-CM | POA: Insufficient documentation

## 2016-10-11 DIAGNOSIS — I252 Old myocardial infarction: Secondary | ICD-10-CM | POA: Diagnosis not present

## 2016-10-11 DIAGNOSIS — Z885 Allergy status to narcotic agent status: Secondary | ICD-10-CM | POA: Insufficient documentation

## 2016-10-11 DIAGNOSIS — E1151 Type 2 diabetes mellitus with diabetic peripheral angiopathy without gangrene: Secondary | ICD-10-CM | POA: Insufficient documentation

## 2016-10-11 DIAGNOSIS — Z87891 Personal history of nicotine dependence: Secondary | ICD-10-CM | POA: Insufficient documentation

## 2016-10-11 DIAGNOSIS — Z800 Family history of malignant neoplasm of digestive organs: Secondary | ICD-10-CM | POA: Insufficient documentation

## 2016-10-11 DIAGNOSIS — Z993 Dependence on wheelchair: Secondary | ICD-10-CM | POA: Insufficient documentation

## 2016-10-11 DIAGNOSIS — Z801 Family history of malignant neoplasm of trachea, bronchus and lung: Secondary | ICD-10-CM | POA: Insufficient documentation

## 2016-10-11 DIAGNOSIS — Z825 Family history of asthma and other chronic lower respiratory diseases: Secondary | ICD-10-CM | POA: Insufficient documentation

## 2016-10-11 DIAGNOSIS — Z8249 Family history of ischemic heart disease and other diseases of the circulatory system: Secondary | ICD-10-CM | POA: Insufficient documentation

## 2016-10-11 DIAGNOSIS — Z833 Family history of diabetes mellitus: Secondary | ICD-10-CM | POA: Insufficient documentation

## 2016-10-11 LAB — CBC
HCT: 48.6 % (ref 39.0–52.0)
Hemoglobin: 15.6 g/dL (ref 13.0–17.0)
MCH: 30.5 pg (ref 26.0–34.0)
MCHC: 32.1 g/dL (ref 30.0–36.0)
MCV: 95.1 fL (ref 78.0–100.0)
Platelets: 163 10*3/uL (ref 150–400)
RBC: 5.11 MIL/uL (ref 4.22–5.81)
RDW: 13.8 % (ref 11.5–15.5)
WBC: 6.1 10*3/uL (ref 4.0–10.5)

## 2016-10-11 LAB — PROTIME-INR
INR: 0.93
Prothrombin Time: 12.3 seconds (ref 11.4–15.2)

## 2016-10-11 LAB — APTT: aPTT: 27 seconds (ref 24–36)

## 2016-10-11 LAB — GLUCOSE, CAPILLARY: GLUCOSE-CAPILLARY: 80 mg/dL (ref 65–99)

## 2016-10-11 MED ORDER — FENTANYL CITRATE (PF) 100 MCG/2ML IJ SOLN
INTRAMUSCULAR | Status: AC | PRN
  Administered 2016-10-11: 50 ug via INTRAVENOUS
  Administered 2016-10-11: 25 ug via INTRAVENOUS

## 2016-10-11 MED ORDER — LIDOCAINE HCL (PF) 1 % IJ SOLN
INTRAMUSCULAR | Status: AC
  Filled 2016-10-11: qty 30

## 2016-10-11 MED ORDER — SODIUM CHLORIDE 0.9 % IV SOLN: INTRAVENOUS | Status: DC

## 2016-10-11 MED ORDER — FENTANYL CITRATE (PF) 100 MCG/2ML IJ SOLN
INTRAMUSCULAR | Status: AC
  Filled 2016-10-11: qty 4

## 2016-10-11 MED ORDER — MIDAZOLAM HCL 2 MG/2ML IJ SOLN
INTRAMUSCULAR | Status: AC | PRN
  Administered 2016-10-11 (×2): 1 mg via INTRAVENOUS

## 2016-10-11 MED ORDER — MIDAZOLAM HCL 2 MG/2ML IJ SOLN
INTRAMUSCULAR | Status: AC
  Filled 2016-10-11: qty 4

## 2016-10-11 MED ORDER — GELATIN ABSORBABLE 12-7 MM EX MISC
CUTANEOUS | Status: AC
  Filled 2016-10-11: qty 1

## 2016-10-11 MED ORDER — TRAMADOL HCL 50 MG PO TABS: 50.0000 mg | ORAL_TABLET | Freq: Four times a day (QID) | ORAL | Status: DC | PRN
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
## 2016-10-11 NOTE — Procedures (Signed)
US guided core biopsies from right hepatic lobe. 3 cores obtained.  Minimal blood loss and no immediate complication.

## 2016-10-11 NOTE — Sedation Documentation (Signed)
Patient is resting comfortably. 

## 2016-10-11 NOTE — Discharge Instructions (Signed)
Liver Biopsy, Care After °These instructions give you information on caring for yourself after your procedure. Your doctor may also give you more specific instructions. Call your doctor if you have any problems or questions after your procedure. °Follow these instructions at home: °· Rest at home for 1-2 days or as told by your doctor. °· Have someone stay with you for at least 24 hours. °· Do not do these things in the first 24 hours: °? Drive. °? Use machinery. °? Take care of other people. °? Sign legal documents. °? Take a bath or shower. °· There are many different ways to close and cover a cut (incision). For example, a cut can be closed with stitches, skin glue, or adhesive strips. Follow your doctor's instructions on: °? Taking care of your cut. °? Changing and removing your bandage (dressing). °? Removing whatever was used to close your cut. °· Do not drink alcohol in the first week. °· Do not lift more than 5 pounds or play contact sports for the first 2 weeks. °· Take medicines only as told by your doctor. For 1 week, do not take medicine that has aspirin in it or medicines like ibuprofen. °· Get your test results. °Contact a doctor if: °· A cut bleeds and leaves more than just a small spot of blood. °· A cut is red, puffs up (swells), or hurts more than before. °· Fluid or something else comes from a cut. °· A cut smells bad. °· You have a fever or chills. °Get help right away if: °· You have swelling, bloating, or pain in your belly (abdomen). °· You get dizzy or faint. °· You have a rash. °· You feel sick to your stomach (nauseous) or throw up (vomit). °· You have trouble breathing, feel short of breath, or feel faint. °· Your chest hurts. °· You have problems talking or seeing. °· You have trouble balancing or moving your arms or legs. °This information is not intended to replace advice given to you by your health care provider. Make sure you discuss any questions you have with your health care  provider. °Document Released: 09/27/2007 Document Revised: 05/26/2015 Document Reviewed: 02/13/2013 °Elsevier Interactive Patient Education © 2018 [employer] ° °

## 2016-10-11 NOTE — Sedation Documentation (Signed)
Bandaid R flank  

## 2016-11-04 ENCOUNTER — Emergency Department (HOSPITAL_COMMUNITY): Payer: Medicare Other

## 2016-11-04 ENCOUNTER — Inpatient Hospital Stay (HOSPITAL_COMMUNITY)
Admission: EM | Admit: 2016-11-04 | Discharge: 2016-11-07 | DRG: 988 | Disposition: A | Discharge status: Home Health | Payer: Medicare Other | Attending: Internal Medicine | Admitting: Internal Medicine

## 2016-11-04 ENCOUNTER — Encounter (HOSPITAL_COMMUNITY): Payer: Self-pay | Admitting: Emergency Medicine

## 2016-11-04 DIAGNOSIS — E669 Obesity, unspecified: Secondary | ICD-10-CM | POA: Diagnosis present

## 2016-11-04 DIAGNOSIS — I872 Venous insufficiency (chronic) (peripheral): Secondary | ICD-10-CM | POA: Diagnosis present

## 2016-11-04 DIAGNOSIS — T8361XA Infection and inflammatory reaction due to implanted penile prosthesis, initial encounter: Secondary | ICD-10-CM | POA: Diagnosis present

## 2016-11-04 DIAGNOSIS — I2510 Atherosclerotic heart disease of native coronary artery without angina pectoris: Secondary | ICD-10-CM | POA: Diagnosis present

## 2016-11-04 DIAGNOSIS — Z801 Family history of malignant neoplasm of trachea, bronchus and lung: Secondary | ICD-10-CM

## 2016-11-04 DIAGNOSIS — I252 Old myocardial infarction: Secondary | ICD-10-CM

## 2016-11-04 DIAGNOSIS — Z955 Presence of coronary angioplasty implant and graft: Secondary | ICD-10-CM

## 2016-11-04 DIAGNOSIS — Z993 Dependence on wheelchair: Secondary | ICD-10-CM

## 2016-11-04 DIAGNOSIS — Z794 Long term (current) use of insulin: Secondary | ICD-10-CM

## 2016-11-04 DIAGNOSIS — N3941 Urge incontinence: Secondary | ICD-10-CM | POA: Diagnosis present

## 2016-11-04 DIAGNOSIS — Z951 Presence of aortocoronary bypass graft: Secondary | ICD-10-CM

## 2016-11-04 DIAGNOSIS — K651 Peritoneal abscess: Principal | ICD-10-CM | POA: Diagnosis present

## 2016-11-04 DIAGNOSIS — Z91048 Other nonmedicinal substance allergy status: Secondary | ICD-10-CM

## 2016-11-04 DIAGNOSIS — R140 Abdominal distension (gaseous): Secondary | ICD-10-CM | POA: Diagnosis not present

## 2016-11-04 DIAGNOSIS — Z9842 Cataract extraction status, left eye: Secondary | ICD-10-CM

## 2016-11-04 DIAGNOSIS — I259 Chronic ischemic heart disease, unspecified: Secondary | ICD-10-CM | POA: Diagnosis present

## 2016-11-04 DIAGNOSIS — Z888 Allergy status to other drugs, medicaments and biological substances status: Secondary | ICD-10-CM

## 2016-11-04 DIAGNOSIS — E860 Dehydration: Secondary | ICD-10-CM | POA: Diagnosis present

## 2016-11-04 DIAGNOSIS — K650 Generalized (acute) peritonitis: Secondary | ICD-10-CM | POA: Diagnosis present

## 2016-11-04 DIAGNOSIS — I10 Essential (primary) hypertension: Secondary | ICD-10-CM | POA: Diagnosis present

## 2016-11-04 DIAGNOSIS — Z23 Encounter for immunization: Secondary | ICD-10-CM

## 2016-11-04 DIAGNOSIS — Z7982 Long term (current) use of aspirin: Secondary | ICD-10-CM

## 2016-11-04 DIAGNOSIS — Z6833 Body mass index (BMI) 33.0-33.9, adult: Secondary | ICD-10-CM

## 2016-11-04 DIAGNOSIS — Z8249 Family history of ischemic heart disease and other diseases of the circulatory system: Secondary | ICD-10-CM

## 2016-11-04 DIAGNOSIS — G4733 Obstructive sleep apnea (adult) (pediatric): Secondary | ICD-10-CM | POA: Diagnosis present

## 2016-11-04 DIAGNOSIS — Z833 Family history of diabetes mellitus: Secondary | ICD-10-CM

## 2016-11-04 DIAGNOSIS — Z800 Family history of malignant neoplasm of digestive organs: Secondary | ICD-10-CM

## 2016-11-04 DIAGNOSIS — Z825 Family history of asthma and other chronic lower respiratory diseases: Secondary | ICD-10-CM

## 2016-11-04 DIAGNOSIS — J449 Chronic obstructive pulmonary disease, unspecified: Secondary | ICD-10-CM | POA: Diagnosis present

## 2016-11-04 DIAGNOSIS — Z961 Presence of intraocular lens: Secondary | ICD-10-CM | POA: Diagnosis present

## 2016-11-04 DIAGNOSIS — Z981 Arthrodesis status: Secondary | ICD-10-CM

## 2016-11-04 DIAGNOSIS — Z9049 Acquired absence of other specified parts of digestive tract: Secondary | ICD-10-CM

## 2016-11-04 DIAGNOSIS — Z9841 Cataract extraction status, right eye: Secondary | ICD-10-CM

## 2016-11-04 DIAGNOSIS — N179 Acute kidney failure, unspecified: Secondary | ICD-10-CM | POA: Diagnosis present

## 2016-11-04 DIAGNOSIS — Z87891 Personal history of nicotine dependence: Secondary | ICD-10-CM

## 2016-11-04 DIAGNOSIS — Z8781 Personal history of (healed) traumatic fracture: Secondary | ICD-10-CM

## 2016-11-04 DIAGNOSIS — K219 Gastro-esophageal reflux disease without esophagitis: Secondary | ICD-10-CM | POA: Diagnosis present

## 2016-11-04 DIAGNOSIS — Z885 Allergy status to narcotic agent status: Secondary | ICD-10-CM

## 2016-11-04 DIAGNOSIS — N401 Enlarged prostate with lower urinary tract symptoms: Secondary | ICD-10-CM | POA: Diagnosis present

## 2016-11-04 DIAGNOSIS — E1151 Type 2 diabetes mellitus with diabetic peripheral angiopathy without gangrene: Secondary | ICD-10-CM | POA: Diagnosis present

## 2016-11-04 DIAGNOSIS — Z7902 Long term (current) use of antithrombotics/antiplatelets: Secondary | ICD-10-CM

## 2016-11-04 LAB — COMPREHENSIVE METABOLIC PANEL
ALBUMIN: 3.2 g/dL — AB (ref 3.5–5.0)
ALK PHOS: 44 U/L (ref 38–126)
ALT: 25 U/L (ref 17–63)
ANION GAP: 11 (ref 5–15)
AST: 20 U/L (ref 15–41)
BUN: 18 mg/dL (ref 6–20)
CALCIUM: 8.9 mg/dL (ref 8.9–10.3)
CO2: 22 mmol/L (ref 22–32)
Chloride: 102 mmol/L (ref 101–111)
Creatinine, Ser: 1.31 mg/dL — ABNORMAL HIGH (ref 0.61–1.24)
GFR calc Af Amer: >60 mL/min (ref >60)
GFR calc non Af Amer: 55 mL/min — ABNORMAL LOW (ref >60)
GLUCOSE: 151 mg/dL — AB (ref 65–99)
Potassium: 3.4 mmol/L — ABNORMAL LOW (ref 3.5–5.1)
SODIUM: 135 mmol/L (ref 135–145)
Total Bilirubin: 0.7 mg/dL (ref 0.3–1.2)
Total Protein: 7.5 g/dL (ref 6.5–8.1)

## 2016-11-04 LAB — CBC
HCT: 44.9 % (ref 39.0–52.0)
HEMOGLOBIN: 14.8 g/dL (ref 13.0–17.0)
MCH: 31 pg (ref 26.0–34.0)
MCHC: 33 g/dL (ref 30.0–36.0)
MCV: 94.1 fL (ref 78.0–100.0)
Platelets: 180 10*3/uL (ref 150–400)
RBC: 4.77 MIL/uL (ref 4.22–5.81)
RDW: 13 % (ref 11.5–15.5)
WBC: 8.1 10*3/uL (ref 4.0–10.5)

## 2016-11-04 LAB — LIPASE, BLOOD: Lipase: 20 U/L (ref 11–51)

## 2016-11-04 LAB — I-STAT CG4 LACTIC ACID, ED: Lactic Acid, Venous: 1.11 mmol/L (ref 0.5–1.9)

## 2016-11-04 MED ORDER — FAMOTIDINE IN NACL 20-0.9 MG/50ML-% IV SOLN
20.0000 mg | Freq: Once | INTRAVENOUS | Status: AC
  Administered 2016-11-04: 20 mg via INTRAVENOUS
  Filled 2016-11-04: qty 50

## 2016-11-04 MED ORDER — SODIUM CHLORIDE 0.9 % IV BOLUS (SEPSIS)
1000.0000 mL | Freq: Once | INTRAVENOUS | Status: AC
  Administered 2016-11-04: 1000 mL via INTRAVENOUS

## 2016-11-04 MED ORDER — HYDROMORPHONE HCL 1 MG/ML IJ SOLN
1.0000 mg | Freq: Once | INTRAMUSCULAR | Status: AC
  Administered 2016-11-04: 1 mg via INTRAVENOUS
  Filled 2016-11-04: qty 1

## 2016-11-04 MED ORDER — PIPERACILLIN-TAZOBACTAM 3.375 G IVPB 30 MIN
3.3750 g | Freq: Once | INTRAVENOUS | Status: AC
  Administered 2016-11-04: 3.375 g via INTRAVENOUS
  Filled 2016-11-04: qty 50

## 2016-11-04 MED ORDER — IOPAMIDOL (ISOVUE-300) INJECTION 61%
INTRAVENOUS | Status: AC
  Administered 2016-11-04: 100 mL
  Filled 2016-11-04: qty 100

## 2016-11-04 MED ORDER — ONDANSETRON HCL 4 MG/2ML IJ SOLN
4.0000 mg | Freq: Once | INTRAMUSCULAR | Status: AC
  Administered 2016-11-04: 4 mg via INTRAVENOUS
  Filled 2016-11-04: qty 2

## 2016-11-04 NOTE — ED Triage Notes (Signed)
Pt presents with lower abd pain that he described as a ripping sensation; pt states also hx of inguinal hernia and no BM since pain began 2-3 days ago; pt also reports subjective fever and chills

## 2016-11-04 NOTE — ED Notes (Signed)
Taken to xray at this time. 
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
## 2016-11-05 ENCOUNTER — Other Ambulatory Visit: Payer: Self-pay

## 2016-11-05 ENCOUNTER — Encounter (HOSPITAL_COMMUNITY)
Admission: EM | Disposition: A | Discharge status: Home Health | Payer: Self-pay | Source: Home / Self Care | Attending: Internal Medicine

## 2016-11-05 ENCOUNTER — Inpatient Hospital Stay (HOSPITAL_COMMUNITY): Payer: Medicare Other | Admitting: Certified Registered Nurse Anesthetist

## 2016-11-05 ENCOUNTER — Encounter (HOSPITAL_COMMUNITY): Payer: Self-pay | Admitting: Anesthesiology

## 2016-11-05 DIAGNOSIS — K651 Peritoneal abscess: Secondary | ICD-10-CM | POA: Diagnosis not present

## 2016-11-05 DIAGNOSIS — K219 Gastro-esophageal reflux disease without esophagitis: Secondary | ICD-10-CM | POA: Diagnosis not present

## 2016-11-05 DIAGNOSIS — I10 Essential (primary) hypertension: Secondary | ICD-10-CM | POA: Diagnosis not present

## 2016-11-05 DIAGNOSIS — E669 Obesity, unspecified: Secondary | ICD-10-CM | POA: Diagnosis not present

## 2016-11-05 DIAGNOSIS — I872 Venous insufficiency (chronic) (peripheral): Secondary | ICD-10-CM | POA: Diagnosis present

## 2016-11-05 DIAGNOSIS — N400 Enlarged prostate without lower urinary tract symptoms: Secondary | ICD-10-CM | POA: Diagnosis not present

## 2016-11-05 DIAGNOSIS — N179 Acute kidney failure, unspecified: Secondary | ICD-10-CM | POA: Diagnosis not present

## 2016-11-05 DIAGNOSIS — I259 Chronic ischemic heart disease, unspecified: Secondary | ICD-10-CM | POA: Diagnosis not present

## 2016-11-05 DIAGNOSIS — Z23 Encounter for immunization: Secondary | ICD-10-CM | POA: Diagnosis not present

## 2016-11-05 DIAGNOSIS — Z9842 Cataract extraction status, left eye: Secondary | ICD-10-CM | POA: Diagnosis not present

## 2016-11-05 DIAGNOSIS — E1151 Type 2 diabetes mellitus with diabetic peripheral angiopathy without gangrene: Secondary | ICD-10-CM | POA: Diagnosis not present

## 2016-11-05 DIAGNOSIS — E860 Dehydration: Secondary | ICD-10-CM | POA: Diagnosis not present

## 2016-11-05 DIAGNOSIS — Z9841 Cataract extraction status, right eye: Secondary | ICD-10-CM | POA: Diagnosis not present

## 2016-11-05 DIAGNOSIS — Z8781 Personal history of (healed) traumatic fracture: Secondary | ICD-10-CM | POA: Diagnosis not present

## 2016-11-05 DIAGNOSIS — Z961 Presence of intraocular lens: Secondary | ICD-10-CM | POA: Diagnosis present

## 2016-11-05 DIAGNOSIS — T8361XA Infection and inflammatory reaction due to implanted penile prosthesis, initial encounter: Secondary | ICD-10-CM | POA: Diagnosis not present

## 2016-11-05 DIAGNOSIS — R140 Abdominal distension (gaseous): Secondary | ICD-10-CM | POA: Diagnosis present

## 2016-11-05 DIAGNOSIS — Z993 Dependence on wheelchair: Secondary | ICD-10-CM | POA: Diagnosis not present

## 2016-11-05 DIAGNOSIS — I2510 Atherosclerotic heart disease of native coronary artery without angina pectoris: Secondary | ICD-10-CM | POA: Diagnosis not present

## 2016-11-05 DIAGNOSIS — J449 Chronic obstructive pulmonary disease, unspecified: Secondary | ICD-10-CM | POA: Diagnosis not present

## 2016-11-05 DIAGNOSIS — Z955 Presence of coronary angioplasty implant and graft: Secondary | ICD-10-CM | POA: Diagnosis not present

## 2016-11-05 DIAGNOSIS — G4733 Obstructive sleep apnea (adult) (pediatric): Secondary | ICD-10-CM | POA: Diagnosis not present

## 2016-11-05 DIAGNOSIS — I252 Old myocardial infarction: Secondary | ICD-10-CM | POA: Diagnosis not present

## 2016-11-05 DIAGNOSIS — K650 Generalized (acute) peritonitis: Secondary | ICD-10-CM | POA: Diagnosis not present

## 2016-11-05 DIAGNOSIS — Z951 Presence of aortocoronary bypass graft: Secondary | ICD-10-CM | POA: Diagnosis not present

## 2016-11-05 DIAGNOSIS — N401 Enlarged prostate with lower urinary tract symptoms: Secondary | ICD-10-CM | POA: Diagnosis present

## 2016-11-05 DIAGNOSIS — N3941 Urge incontinence: Secondary | ICD-10-CM | POA: Diagnosis present

## 2016-11-05 DIAGNOSIS — Z6833 Body mass index (BMI) 33.0-33.9, adult: Secondary | ICD-10-CM | POA: Diagnosis not present

## 2016-11-05 HISTORY — PX: PENILE PROSTHESIS IMPLANT: SHX240

## 2016-11-05 LAB — URINALYSIS, ROUTINE W REFLEX MICROSCOPIC
BACTERIA UA: NONE SEEN
Bilirubin Urine: NEGATIVE
Glucose, UA: >=500 mg/dL — AB
KETONES UR: NEGATIVE mg/dL
Leukocytes, UA: NEGATIVE
Nitrite: NEGATIVE
PROTEIN: NEGATIVE mg/dL
Specific Gravity, Urine: 1.042 — ABNORMAL HIGH (ref 1.005–1.030)
Squamous Epithelial / LPF: NONE SEEN
pH: 5 (ref 5.0–8.0)

## 2016-11-05 LAB — GLUCOSE, CAPILLARY
GLUCOSE-CAPILLARY: 108 mg/dL — AB (ref 65–99)
GLUCOSE-CAPILLARY: 103 mg/dL — AB (ref 65–99)
GLUCOSE-CAPILLARY: 86 mg/dL (ref 65–99)
GLUCOSE-CAPILLARY: 134 mg/dL — AB (ref 65–99)
Glucose-Capillary: 94 mg/dL (ref 65–99)
Glucose-Capillary: 95 mg/dL (ref 65–99)
Glucose-Capillary: 133 mg/dL — ABNORMAL HIGH (ref 65–99)

## 2016-11-05 SURGERY — INSERTION, PENILE PROSTHESIS
Anesthesia: General

## 2016-11-05 MED ORDER — PIPERACILLIN-TAZOBACTAM 3.375 G IVPB
3.3750 g | Freq: Three times a day (TID) | INTRAVENOUS | Status: DC
  Administered 2016-11-05 – 2016-11-07 (×8): 3.375 g via INTRAVENOUS
  Filled 2016-11-05 (×7): qty 50

## 2016-11-05 MED ORDER — HYDROMORPHONE HCL 1 MG/ML IJ SOLN: 0.5000 mg | INTRAMUSCULAR | Status: DC | PRN

## 2016-11-05 MED ORDER — ONDANSETRON HCL 4 MG/2ML IJ SOLN
4.0000 mg | Freq: Four times a day (QID) | INTRAMUSCULAR | Status: DC | PRN
  Administered 2016-11-05: 4 mg via INTRAVENOUS

## 2016-11-05 MED ORDER — SODIUM CHLORIDE 0.9 % IV SOLN
INTRAVENOUS | Status: DC | PRN
  Administered 2016-11-05: 500 mL

## 2016-11-05 MED ORDER — VANCOMYCIN HCL 10 G IV SOLR
1500.0000 mg | INTRAVENOUS | Status: DC
  Administered 2016-11-05 – 2016-11-07 (×3): 1500 mg via INTRAVENOUS
  Filled 2016-11-05 (×3): qty 1500

## 2016-11-05 MED ORDER — LIDOCAINE 2% (20 MG/ML) 5 ML SYRINGE
INTRAMUSCULAR | Status: DC | PRN
  Administered 2016-11-05: 100 mg via INTRAVENOUS

## 2016-11-05 MED ORDER — FENTANYL CITRATE (PF) 100 MCG/2ML IJ SOLN: 25.0000 ug | INTRAMUSCULAR | Status: DC | PRN

## 2016-11-05 MED ORDER — GABAPENTIN 400 MG PO CAPS
400.0000 mg | ORAL_CAPSULE | Freq: Three times a day (TID) | ORAL | Status: DC
  Administered 2016-11-05 – 2016-11-07 (×7): 400 mg via ORAL
  Filled 2016-11-05 (×8): qty 1

## 2016-11-05 MED ORDER — IBUPROFEN 200 MG PO TABS: 400.0000 mg | ORAL_TABLET | Freq: Four times a day (QID) | ORAL | Status: DC | PRN

## 2016-11-05 MED ORDER — ACETAMINOPHEN 650 MG RE SUPP: 650.0000 mg | Freq: Four times a day (QID) | RECTAL | Status: DC | PRN

## 2016-11-05 MED ORDER — DOXAZOSIN MESYLATE 2 MG PO TABS
2.0000 mg | ORAL_TABLET | Freq: Two times a day (BID) | ORAL | Status: DC
  Administered 2016-11-05 – 2016-11-07 (×5): 2 mg via ORAL
  Filled 2016-11-05 (×5): qty 1

## 2016-11-05 MED ORDER — UMECLIDINIUM-VILANTEROL 62.5-25 MCG/INH IN AEPB
1.0000 | INHALATION_SPRAY | Freq: Every day | RESPIRATORY_TRACT | Status: DC
  Administered 2016-11-05 – 2016-11-07 (×3): 1 via RESPIRATORY_TRACT
  Filled 2016-11-05: qty 14

## 2016-11-05 MED ORDER — SUGAMMADEX SODIUM 200 MG/2ML IV SOLN
INTRAVENOUS | Status: AC
  Filled 2016-11-05: qty 2

## 2016-11-05 MED ORDER — VARENICLINE TARTRATE 1 MG PO TABS
1.0000 mg | ORAL_TABLET | Freq: Two times a day (BID) | ORAL | Status: DC
  Administered 2016-11-05 – 2016-11-07 (×4): 1 mg via ORAL
  Filled 2016-11-05 (×6): qty 1

## 2016-11-05 MED ORDER — VITAMIN B-12 1000 MCG PO TABS
1000.0000 ug | ORAL_TABLET | Freq: Every day | ORAL | Status: DC
  Administered 2016-11-05 – 2016-11-07 (×3): 1000 ug via ORAL
  Filled 2016-11-05 (×3): qty 1

## 2016-11-05 MED ORDER — ONDANSETRON HCL 4 MG/2ML IJ SOLN: 4.0000 mg | Freq: Once | INTRAMUSCULAR | Status: DC | PRN

## 2016-11-05 MED ORDER — ACETAMINOPHEN 325 MG PO TABS: 650.0000 mg | ORAL_TABLET | Freq: Four times a day (QID) | ORAL | Status: DC | PRN

## 2016-11-05 MED ORDER — ATORVASTATIN CALCIUM 40 MG PO TABS
40.0000 mg | ORAL_TABLET | Freq: Every day | ORAL | Status: DC
  Administered 2016-11-05 – 2016-11-06 (×2): 40 mg via ORAL
  Filled 2016-11-05 (×2): qty 1

## 2016-11-05 MED ORDER — FLUTICASONE PROPIONATE 50 MCG/ACT NA SUSP
1.0000 | Freq: Two times a day (BID) | NASAL | Status: DC
  Administered 2016-11-05 – 2016-11-07 (×4): 1 via NASAL
  Filled 2016-11-05: qty 16

## 2016-11-05 MED ORDER — ONDANSETRON HCL 4 MG PO TABS: 4.0000 mg | ORAL_TABLET | Freq: Four times a day (QID) | ORAL | Status: DC | PRN

## 2016-11-05 MED ORDER — SODIUM CHLORIDE 0.9 % IV SOLN
INTRAVENOUS | Status: AC
  Filled 2016-11-05: qty 500000

## 2016-11-05 MED ORDER — VITAMIN D (ERGOCALCIFEROL) 1.25 MG (50000 UNIT) PO CAPS
50000.0000 [IU] | ORAL_CAPSULE | ORAL | Status: DC
  Administered 2016-11-07: 50000 [IU] via ORAL
  Filled 2016-11-05: qty 1

## 2016-11-05 MED ORDER — METOPROLOL TARTRATE 50 MG PO TABS
50.0000 mg | ORAL_TABLET | Freq: Two times a day (BID) | ORAL | Status: DC
  Administered 2016-11-05 – 2016-11-07 (×4): 50 mg via ORAL
  Filled 2016-11-05 (×5): qty 1

## 2016-11-05 MED ORDER — LACTATED RINGERS IV SOLN
INTRAVENOUS | Status: DC
  Administered 2016-11-05 (×2): via INTRAVENOUS

## 2016-11-05 MED ORDER — PNEUMOCOCCAL VAC POLYVALENT 25 MCG/0.5ML IJ INJ
0.5000 mL | INJECTION | INTRAMUSCULAR | Status: AC
  Administered 2016-11-06: 0.5 mL via INTRAMUSCULAR
  Filled 2016-11-05: qty 0.5

## 2016-11-05 MED ORDER — FENTANYL CITRATE (PF) 100 MCG/2ML IJ SOLN
INTRAMUSCULAR | Status: AC
  Filled 2016-11-05: qty 2

## 2016-11-05 MED ORDER — FENTANYL CITRATE (PF) 100 MCG/2ML IJ SOLN
INTRAMUSCULAR | Status: DC | PRN
  Administered 2016-11-05: 100 ug via INTRAVENOUS

## 2016-11-05 MED ORDER — ROCURONIUM BROMIDE 10 MG/ML (PF) SYRINGE
PREFILLED_SYRINGE | INTRAVENOUS | Status: DC | PRN
  Administered 2016-11-05: 50 mg via INTRAVENOUS

## 2016-11-05 MED ORDER — DEXAMETHASONE SODIUM PHOSPHATE 4 MG/ML IJ SOLN
INTRAMUSCULAR | Status: DC | PRN
  Administered 2016-11-05: 5 mg via INTRAVENOUS

## 2016-11-05 MED ORDER — DOCUSATE SODIUM 100 MG PO CAPS
100.0000 mg | ORAL_CAPSULE | Freq: Every evening | ORAL | Status: DC
  Administered 2016-11-05: 100 mg via ORAL
  Filled 2016-11-05 (×2): qty 1

## 2016-11-05 MED ORDER — SODIUM CHLORIDE 0.9 % IV SOLN
INTRAVENOUS | Status: DC
  Administered 2016-11-05 – 2016-11-06 (×4): via INTRAVENOUS

## 2016-11-05 MED ORDER — PANTOPRAZOLE SODIUM 40 MG PO TBEC
80.0000 mg | DELAYED_RELEASE_TABLET | Freq: Two times a day (BID) | ORAL | Status: DC
  Administered 2016-11-05 – 2016-11-07 (×6): 80 mg via ORAL
  Filled 2016-11-05 (×7): qty 2

## 2016-11-05 MED ORDER — SUGAMMADEX SODIUM 200 MG/2ML IV SOLN
INTRAVENOUS | Status: DC | PRN
  Administered 2016-11-05: 200 mg via INTRAVENOUS

## 2016-11-05 MED ORDER — PROPOFOL 10 MG/ML IV BOLUS
INTRAVENOUS | Status: DC | PRN
  Administered 2016-11-05: 150 mg via INTRAVENOUS

## 2016-11-05 MED ORDER — TAMSULOSIN HCL 0.4 MG PO CAPS: 0.4000 mg | ORAL_CAPSULE | Freq: Every day | ORAL | Status: DC

## 2016-11-05 MED ORDER — INSULIN DETEMIR 100 UNIT/ML ~~LOC~~ SOLN
17.0000 [IU] | Freq: Every day | SUBCUTANEOUS | Status: DC
  Administered 2016-11-05 – 2016-11-07 (×3): 17 [IU] via SUBCUTANEOUS
  Filled 2016-11-05 (×3): qty 0.17

## 2016-11-05 MED ORDER — MIDAZOLAM HCL 2 MG/2ML IJ SOLN
INTRAMUSCULAR | Status: AC
  Filled 2016-11-05: qty 2

## 2016-11-05 MED ORDER — INSULIN ASPART 100 UNIT/ML ~~LOC~~ SOLN
0.0000 [IU] | SUBCUTANEOUS | Status: DC
  Administered 2016-11-05 – 2016-11-06 (×4): 2 [IU] via SUBCUTANEOUS
  Administered 2016-11-06: 3 [IU] via SUBCUTANEOUS
  Administered 2016-11-06 – 2016-11-07 (×2): 2 [IU] via SUBCUTANEOUS

## 2016-11-05 MED ORDER — MIDAZOLAM HCL 5 MG/5ML IJ SOLN
INTRAMUSCULAR | Status: DC | PRN
  Administered 2016-11-05: 2 mg via INTRAVENOUS

## 2016-11-05 MED ORDER — OXYBUTYNIN CHLORIDE ER 5 MG PO TB24
15.0000 mg | ORAL_TABLET | Freq: Every day | ORAL | Status: DC
  Administered 2016-11-05 – 2016-11-06 (×2): 15 mg via ORAL
  Filled 2016-11-05 (×3): qty 3

## 2016-11-05 SURGICAL SUPPLY — 30 items
APL SKNCLS STERI-STRIP NONHPOA (GAUZE/BANDAGES/DRESSINGS) ×1
BAG URINE DRAINAGE (UROLOGICAL SUPPLIES) ×1 IMPLANT
BANDAGE COBAN STERILE 2 (GAUZE/BANDAGES/DRESSINGS) IMPLANT
BENZOIN TINCTURE PRP APPL 2/3 (GAUZE/BANDAGES/DRESSINGS) ×2 IMPLANT
BLADE HEX COATED 2.75 (ELECTRODE) ×2 IMPLANT
BNDG GAUZE ELAST 4 BULKY (GAUZE/BANDAGES/DRESSINGS) ×1 IMPLANT
CATH FOLEY 2WAY SLVR  5CC 16FR (CATHETERS)
CATH FOLEY 2WAY SLVR 5CC 16FR (CATHETERS) ×1 IMPLANT
COVER MAYO STAND STRL (DRAPES) ×2 IMPLANT
COVER SURGICAL LIGHT HANDLE (MISCELLANEOUS) ×2 IMPLANT
DISSECTOR ROUND CHERRY 3/8 STR (MISCELLANEOUS) ×2 IMPLANT
DRAPE LAPAROTOMY T 102X78X121 (DRAPES) ×2 IMPLANT
DRSG TEGADERM 4X4.75 (GAUZE/BANDAGES/DRESSINGS) ×2 IMPLANT
GAUZE SPONGE 4X4 12PLY STRL (GAUZE/BANDAGES/DRESSINGS) ×1 IMPLANT
GLOVE BIOGEL M STRL SZ7.5 (GLOVE) ×2 IMPLANT
GOWN STRL REUS W/TWL LRG LVL3 (GOWN DISPOSABLE) ×2 IMPLANT
KIT BASIN OR (CUSTOM PROCEDURE TRAY) ×2 IMPLANT
PACK GENERAL/GYN (CUSTOM PROCEDURE TRAY) ×2 IMPLANT
PAD ABD 8X10 STRL (GAUZE/BANDAGES/DRESSINGS) ×1 IMPLANT
PLUG CATH AND CAP STER (CATHETERS) ×1 IMPLANT
RETRACTOR WILSON SYSTEM (INSTRUMENTS) IMPLANT
SOL PREP POV-IOD 4OZ 10% (MISCELLANEOUS) ×2 IMPLANT
SOL PREP PROV IODINE SCRUB 4OZ (MISCELLANEOUS) ×2 IMPLANT
SPONGE LAP 4X18 X RAY DECT (DISPOSABLE) IMPLANT
STRIP CLOSURE SKIN 1/2X4 (GAUZE/BANDAGES/DRESSINGS) ×2 IMPLANT
SUPPORT SCROTAL LG STRP (MISCELLANEOUS) ×2 IMPLANT
SUT VIC AB 2-0 UR5 27 (SUTURE) ×12 IMPLANT
SYR 20CC LL (SYRINGE) ×2 IMPLANT
SYR 50ML LL SCALE MARK (SYRINGE) ×3 IMPLANT
TOWEL OR 17X26 10 PK STRL BLUE (TOWEL DISPOSABLE) ×3 IMPLANT

## 2016-11-05 NOTE — Progress Notes (Signed)
Patient seen and examined. Admitted after midnight secondary to pelvic abscess tracking along spermatic cord and possibly involving penile implant. Patient seen by urology service and will be taken to OR later today for I&D. hemodynamically stable, afebrile and with normal WBC's. Will continue IV antibiotics and supportive care. Please refer to H&P written by Dr. Alcario Drought for further info/details on admission.  Barton Dubois MD [phone]

## 2016-11-05 NOTE — Anesthesia Preprocedure Evaluation (Addendum)
Anesthesia Evaluation  Patient identified by MRN, date of birth, ID band Patient awake    Reviewed: Allergy & Precautions, NPO status , Patient's Chart, lab work & pertinent test results, reviewed documented beta blocker date and time   Airway Mallampati: IV  TM Distance: >3 FB Neck ROM: Full    Dental  (+) Teeth Intact, Dental Advisory Given   Pulmonary sleep apnea and Continuous Positive Airway Pressure Ventilation , COPD,  COPD inhaler, former smoker,    Pulmonary exam normal breath sounds clear to auscultation       Cardiovascular hypertension, Pt. on home beta blockers and Pt. on medications (-) angina+ CAD, + Past MI, + CABG and + Peripheral Vascular Disease  Normal cardiovascular exam Rhythm:Regular Rate:Normal  Echo 04/27/14: Study Conclusions  - Left ventricle: The cavity size was normal. Wall thickness was increased in a pattern of mild LVH. Systolic function was normal. The estimated ejection fraction was in the range of 50% to 55%. Wall motion was normal; there were no regional wall motion abnormalities. Features are consistent with a pseudonormal left  ventricular filling pattern, with concomitant abnormal relaxation and increased filling pressure (grade 2 diastolic dysfunction). - Pulmonary arteries: PA peak pressure: 33 mm Hg (S).   Neuro/Psych PSYCHIATRIC DISORDERS Depression Wheelchair-bound  Neuromuscular disease    GI/Hepatic GERD  Medicated,(+)     substance abuse  alcohol use,   Endo/Other  diabetes, Type 2, Insulin Dependent, Oral Hypoglycemic AgentsObesity   Renal/GU negative Renal ROS   Possible infected penile prosthesis     Musculoskeletal  (+) Arthritis , Osteoarthritis,    Abdominal   Peds  Hematology  (+) Blood dyscrasia (Plavix), ,   Anesthesia Other Findings Day of surgery medications reviewed with the patient.  Reproductive/Obstetrics                             Anesthesia Physical Anesthesia Plan  ASA: III  Anesthesia Plan: General   Post-op Pain Management:    Induction: Intravenous  PONV Risk Score and Plan: 2 and Ondansetron, Midazolam and Dexamethasone  Airway Management Planned: Oral ETT and Video Laryngoscope Planned  Additional Equipment:   Intra-op Plan:   Post-operative Plan: Extubation in OR  Informed Consent: I have reviewed the patients History and Physical, chart, labs and discussed the procedure including the risks, benefits and alternatives for the proposed anesthesia with the patient or authorized representative who has indicated his/her understanding and acceptance.   Dental advisory given  Plan Discussed with: CRNA  Anesthesia Plan Comments: (Risks/benefits of general anesthesia discussed with patient including risk of damage to teeth, lips, gum, and tongue, nausea/vomiting, allergic reactions to medications, and the possibility of heart attack, stroke and death.  All patient questions answered.  Patient wishes to proceed.)       Anesthesia Quick Evaluation

## 2016-11-05 NOTE — ED Notes (Addendum)
Call report to [facility] at [place] am
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
## 2016-11-05 NOTE — Anesthesia Postprocedure Evaluation (Signed)
Anesthesia Post Note  Patient: IZSAK MEIR  Procedure(s) Performed: IRRIGATION AND [place] OF SUPRAPUBIC ABCESS (N/A )     Patient location during evaluation: PACU Anesthesia Type: General Level of consciousness: awake and alert Pain management: pain level controlled Vital Signs Assessment: post-procedure vital signs reviewed and stable Respiratory status: spontaneous breathing, nonlabored ventilation, respiratory function stable and patient connected to nasal cannula oxygen Cardiovascular status: blood pressure returned to baseline and stable Postop Assessment: no apparent nausea or vomiting Anesthetic complications: no    Last Vitals:  Vitals:   11/05/16 1639 11/05/16 1649  BP: (!) 161/65 (!) 158/61  Pulse: 72 71  Resp: (!) 23 20  Temp: 36.7 C 36.7 C  SpO2: 100% 100%    Last Pain:  Vitals:   11/05/16 1630  TempSrc:   PainSc: 0-No pain                 Catalina Gravel

## 2016-11-05 NOTE — Interval H&P Note (Signed)
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 After consideration of risks, benefits and other options for treatment, the patient has consented to  Procedure(s): IRRIGATION AND DEBRIDIMENT OF ABCESS, POSSIBLE EXPLANTATION OF INFLATABLE PENILE PROSTHESIS (N/A) as a surgical intervention .  The patient's history has been reviewed, patient examined, no change in status, stable for surgery.  I have reviewed the patient's chart and labs.  Questions were answered to the patient's satisfaction.     Marton Redwood, III

## 2016-11-05 NOTE — Op Note (Addendum)
Operative Note  Preoperative diagnosis:  1.  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  Anterior pelvic wall subcutaneous abscess approximately 3.5 cm  Procedure(s): 1.  Incision and drainage of anterior wall subcutaneous abscess approximately 3.5 cm  Surgeon: Link Snuffer, MD  Assistants: None  Anesthesia: General  Complications: None immediate  EBL: Minimal  Specimens: 1.  Culture  Drains/Catheters: 1.  None  Intraoperative findings: Large pocket of pus was evacuated.  The penile prosthesis tubing leading to the reservoir was palpated and was separated from the cavity and was not free within the cavity.  It was not obviously involved with the abscess  Indication: 66 year old male who had a penile prosthesis placed several years ago.  He experienced fevers and suprapubic pain over the weekend.  CT scan revealed a 3 cm abscess that was superficial but possibly involving the prosthesis specifically the reservoir component.  Description of procedure:  Patient was identified and consent was obtained.  He was taken back to the operating room and placed in the supine position.  He was prepped and draped in standard sterile fashion.  Timeout was performed.  He was already on antibiotics including vancomycin and Zosyn.  A 4 cm transverse incision was made superior to the penis and carried down with Bovie electrocautery until the abscess cavity was encountered and a large amount of pus was evacuated.  Culture was obtained.  Loculations were broken up digitally and all pus was drained.  This was copiously irrigated with antibiotic solution.  I palpated the cavity and I could feel the underlying tubing which did not feel like it was within the abscess cavity and it felt like it was uninvolved.  I then packed the cavity with saline soaked Kerlix and applied a dressing.  The patient tolerated procedure well and was stable  postoperatively.  Plan: Continue wet-to-dry dressings.  Consult wound care.  Though this is a small wound, it is relatively deep.  He may benefit from a small wound VAC placement.  Continue antibiotics and observe for any change that may necessitate penile prosthesis explant.

## 2016-11-05 NOTE — Anesthesia Procedure Notes (Signed)
Procedure Name: Intubation Date/Time: 11/05/2016 2:47 PM Performed by: Claudia Desanctis, CRNA Pre-anesthesia Checklist: Emergency Drugs available, Suction available, Patient identified and Patient being monitored Patient Re-evaluated:Patient Re-evaluated prior to induction Oxygen Delivery Method: Circle system utilized Preoxygenation: Pre-oxygenation with 100% oxygen Induction Type: IV induction Ventilation: Mask ventilation without difficulty Laryngoscope Size: Glidescope and 3 Grade View: Grade I Tube type: Oral Tube size: 7.5 mm Number of attempts: 1 Airway Equipment and Method: Video-laryngoscopy Placement Confirmation: ETT inserted through vocal cords under direct vision,  positive ETCO2 and breath sounds checked- equal and bilateral Secured at: 23 cm Tube secured with: Tape Dental Injury: Teeth and Oropharynx as per pre-operative assessment

## 2016-11-05 NOTE — Progress Notes (Signed)
Pharmacy Antibiotic Note  Hunter [name] is a 66 y.o. male admitted on 11/04/2016 with cellulitis and intra-abdominal infection. Pharmacy has been consulted for vancomycin and zosyn dosing.  Tmax 99.2, WBC 8.1, and LA 1.11. SCr 1.31 for estimated nCrCl ~ 55-60 mL/min.   Plan: Vancomycin 1.5g IV q24 hr Zosyn 3.375g IV q8hr  Vancomycin trough at SS and PRN (goal 10-15 mcg/mL) Monitor renal function, clinical picture, and culture data F/u length of therapy, de-escalation   Height: 5\' 6"  (167.6 cm) Weight: 213 lb (96.6 kg) IBW/kg (Calculated) : 63.8  Temp (24hrs), Avg:99.2 F (37.3 C), Min:99.2 F (37.3 C), Max:99.2 F (37.3 C)  Recent Labs  Lab 11/04/16 1933 11/04/16 2139  WBC 8.1  --   CREATININE 1.31*  --   LATICACIDVEN  --  1.11    Estimated Creatinine Clearance: 60.3 mL/min (A) (by C-G formula based on SCr of 1.31 mg/dL (H)).    Allergies  Allergen Reactions  . Codeine Nausea And Vomiting and Other (See Comments)    Severe stomach cramps  . Lisinopril Other (See Comments)    cough  . Ramipril Other (See Comments)    cough  . Tape Other (See Comments) and Tinitus    Only plastic tape--can use paper tape OK Blisters    Antimicrobials this admission: 11/4 Zosyn >>  11/5 Vanc >>   Microbiology results: pending   Lavonda Jumbo, PharmD Clinical Pharmacist 11/05/16 12:45 AM

## 2016-11-05 NOTE — ED Notes (Signed)
[facility] contacted for tx to [facility] @ [id number]; Alcario Drought accepting
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
## 2016-11-05 NOTE — Transfer of Care (Signed)
Immediate Anesthesia Transfer of Care Note  Patient: Hunter [name]  Procedure(s) Performed: IRRIGATION AND DEBRIDIMENT OF SUPRAPUBIC ABCESS (N/A )  Patient Location: PACU  Anesthesia Type:General  Level of Consciousness: awake, alert , oriented and patient cooperative  Airway & Oxygen Therapy: Patient Spontanous Breathing and Patient connected to face mask  Post-op Assessment: Report given to RN and Post -op Vital signs reviewed and stable  Post vital signs: Reviewed and stable  Last Vitals:  Vitals:   11/05/16 0431 11/05/16 0753  BP: (!) 147/66   Pulse: 63   Resp: 18   Temp: 37 C   SpO2: 100% 97%    Last Pain:  Vitals:   11/05/16 1227  TempSrc:   PainSc: 2       Patients Stated Pain Goal: 3 (86/48/47 2072)  Complications: No apparent anesthesia complications

## 2016-11-05 NOTE — ED Notes (Signed)
Consent for transfer signed by patient and witnessed by this RN

## 2016-11-06 DIAGNOSIS — K651 Peritoneal abscess: Principal | ICD-10-CM

## 2016-11-06 DIAGNOSIS — N179 Acute kidney failure, unspecified: Secondary | ICD-10-CM

## 2016-11-06 DIAGNOSIS — N400 Enlarged prostate without lower urinary tract symptoms: Secondary | ICD-10-CM

## 2016-11-06 DIAGNOSIS — I259 Chronic ischemic heart disease, unspecified: Secondary | ICD-10-CM

## 2016-11-06 DIAGNOSIS — I10 Essential (primary) hypertension: Secondary | ICD-10-CM

## 2016-11-06 DIAGNOSIS — E1151 Type 2 diabetes mellitus with diabetic peripheral angiopathy without gangrene: Secondary | ICD-10-CM

## 2016-11-06 LAB — BASIC METABOLIC PANEL
ANION GAP: 9 (ref 5–15)
BUN: 16 mg/dL (ref 6–20)
CHLORIDE: 110 mmol/L (ref 101–111)
CO2: 19 mmol/L — AB (ref 22–32)
CREATININE: 0.98 mg/dL (ref 0.61–1.24)
Calcium: 7.8 mg/dL — ABNORMAL LOW (ref 8.9–10.3)
GFR calc non Af Amer: >60 mL/min (ref >60)
Glucose, Bld: 134 mg/dL — ABNORMAL HIGH (ref 65–99)
POTASSIUM: 4.1 mmol/L (ref 3.5–5.1)
SODIUM: 138 mmol/L (ref 135–145)

## 2016-11-06 LAB — CBC WITH DIFFERENTIAL/PLATELET
BASOS PCT: 0 %
Basophils Absolute: 0 10*3/uL (ref 0.0–0.1)
EOS ABS: 0 10*3/uL (ref 0.0–0.7)
Eosinophils Relative: 0 %
HEMATOCRIT: 39.3 % (ref 39.0–52.0)
HEMOGLOBIN: 12.9 g/dL — AB (ref 13.0–17.0)
Lymphocytes Relative: 6 %
Lymphs Abs: 0.4 10*3/uL — ABNORMAL LOW (ref 0.7–4.0)
MCH: 30.9 pg (ref 26.0–34.0)
MCHC: 32.8 g/dL (ref 30.0–36.0)
MCV: 94 fL (ref 78.0–100.0)
MONOS PCT: 10 %
Monocytes Absolute: 0.7 10*3/uL (ref 0.1–1.0)
NEUTROS ABS: 5.8 10*3/uL (ref 1.7–7.7)
NEUTROS PCT: 84 %
Platelets: 171 10*3/uL (ref 150–400)
RBC: 4.18 MIL/uL — AB (ref 4.22–5.81)
RDW: 12.9 % (ref 11.5–15.5)
WBC: 6.9 10*3/uL (ref 4.0–10.5)

## 2016-11-06 LAB — GLUCOSE, CAPILLARY
GLUCOSE-CAPILLARY: 119 mg/dL — AB (ref 65–99)
GLUCOSE-CAPILLARY: 130 mg/dL — AB (ref 65–99)
GLUCOSE-CAPILLARY: 138 mg/dL — AB (ref 65–99)
GLUCOSE-CAPILLARY: 150 mg/dL — AB (ref 65–99)
GLUCOSE-CAPILLARY: 186 mg/dL — AB (ref 65–99)
Glucose-Capillary: 130 mg/dL — ABNORMAL HIGH (ref 65–99)

## 2016-11-06 LAB — CREATININE, SERUM
Creatinine, Ser: 1.02 mg/dL (ref 0.61–1.24)
GFR calc non Af Amer: >60 mL/min (ref >60)

## 2016-11-06 MED ORDER — SACCHAROMYCES BOULARDII 250 MG PO CAPS
250.0000 mg | ORAL_CAPSULE | Freq: Two times a day (BID) | ORAL | Status: DC
  Administered 2016-11-06 – 2016-11-07 (×3): 250 mg via ORAL
  Filled 2016-11-06 (×3): qty 1

## 2016-11-06 NOTE — Consult Note (Signed)
Urology Consult Note   1 Day Post-Op s/p ID of suprapubic abscess. IPP and tubing not involved. AF, VSS. No pain to palpation of IPP.   Subjective: NAEON.   Objective: Vital signs in last 24 hours: Temp:  [97.6 F (36.4 C)-98.8 F (37.1 C)] 97.6 F (36.4 C) (11/06 0417) Pulse Rate:  [62-73] 62 (11/06 0417) Resp:  [12-23] 20 (11/06 0417) BP: (126-161)/(48-78) 136/58 (11/06 0417) SpO2:  [99 %-100 %] 100 % (11/06 0417)  Intake/Output from previous day: 11/05 0701 - 11/06 0700 In: 4640 [P.O.:240; I.V.:3800; IV Piggyback:600] Out: 310 [Urine:300; Blood:10] Intake/Output this shift: No intake/output data recorded.  Physical Exam:  General: Alert and oriented CV: RRR Lungs: Clear Abdomen: Soft, appropriately tender. Incisions c/d/i. JP SS GU: Suprapubic incision with no purulence or drainage. No pain to palpation of IPP components. Resolving erythema. Much improved induration.  Packing changed at bedside.  Ext: NT, No erythema  Lab Results: Recent Labs    11/04/16 1933 11/06/16 0756  HGB 14.8 12.9*  HCT 44.9 39.3   BMET Recent Labs    11/04/16 1933 11/06/16 0449  NA 135  --   K 3.4*  --   CL 102  --   CO2 22  --   GLUCOSE 151*  --   BUN 18  --   CREATININE 1.31* 1.02  CALCIUM 8.9  --      Studies/Results: Ct Abdomen Pelvis W Contrast  Result Date: 11/04/2016 CLINICAL DATA:  Lower abdominal pain. No bowel movement for 3 days. History of inguinal hernia and repair. Fever and chills. EXAM: CT ABDOMEN AND PELVIS WITH CONTRAST TECHNIQUE: Multidetector CT imaging of the abdomen and pelvis was performed using the standard protocol following bolus administration of intravenous contrast. CONTRAST:  162mL ISOVUE-300 IOPAMIDOL (ISOVUE-300) INJECTION 61% COMPARISON:  Acute abdominal series November 04, 2016 at 2157 hours and CT abdomen and pelvis March 28, 2012 FINDINGS: LOWER CHEST: Lung bases are clear. Included heart size is normal. No pericardial effusion. HEPATOBILIARY:  Status post cholecystectomy.  Normal liver. PANCREAS: Normal. SPLEEN: Normal.  Central splenic fat containing cleft. ADRENALS/URINARY TRACT: Kidneys are orthotopic, demonstrating symmetric enhancement. Dense 6 x 9 mm nodule exophytic from upper pole of LEFT kidney was 8 x 12 mm. No nephrolithiasis, hydronephrosis or solid renal masses. The unopacified ureters are normal in course and caliber. Delayed imaging through the kidneys demonstrates symmetric prompt contrast excretion within the proximal urinary collecting system. Urinary bladder is well distended and unremarkable. Subcentimeter bilateral adrenal gland fat containing nodules. STOMACH/BOWEL: Cecum is tethered toward the RIGHT inguinal ring. The stomach, small bowel are normal in course and caliber without inflammatory changes. Normal appendix superior to the cecum. VASCULAR/LYMPHATIC: Aortoiliac vessels are normal in course and caliber. Severe calcific atherosclerosis with possible renal artery stenosis, unchanged. No lymphadenopathy by CT size criteria. REPRODUCTIVE: Normal. Penile implant, intact reservoir RIGHT pelvis. OTHER: No intraperitoneal free fluid or free air. 2.5 x 3.3 cm rim enhancing fluid collection RIGHT anterior pelvis subcutaneous fat tracking along the inguinal canal with thickened and RIGHT pelvic wall fascia. Collection is greater than 2 cm anterior to penile catheter. No subcutaneous gas or radiopaque foreign bodies. Surrounding soft tissue swelling with subcutaneous fat stranding. Stable small fat containing LEFT inguinal hernia. RIGHT anterior abdominal wall subcutaneous fat stranding at the level of the umbilicus. MUSCULOSKELETAL: Nonacute. Status post median sternotomy. RIGHT femur rod tract. Old RIGHT superior and inferior pubic rami fractures. Old RIGHT acetabulum fracture. L5-S1 laminectomy, disc prosthesis. Degenerative change of the lumbar  spine superimposed on congenital canal narrowing. IMPRESSION: 1. Multifocal RIGHT anterior  abdominopelvic wall cellulitis and RIGHT anterior pelvic wall 2.5 x 3.3 cm abscess. Cellulitis/phlegmon tracking along spermatic cord. No intraperitoneal extension. 2. Cecum tethered to the inguinal ring compatible with adhesion. Penile implant, intact reservoir RIGHT pelvis. 3. No acute intra-abdominal or pelvic process. Aortic Atherosclerosis (ICD10-I70.0). Electronically Signed   By: Elon Alas M.D.   On: 11/04/2016 23:56   Dg Abd 2 Views  Result Date: 11/04/2016 CLINICAL DATA:  Abdominal distension. RIGHT inguinal pain for 2 days with mass. Remote history of inguinal hernia. EXAM: ABDOMEN - 2 VIEW COMPARISON:  None CT abdomen and pelvis March 28, 2012. FINDINGS: Included lung bases demonstrate cardiomegaly, s/p CABG for CHF. Mild interstitial prominence. Bowel gas pattern is nondilated and nonobstructive. No intra-abdominal mass effect or pathologic calcifications. Surgical clips in the included right abdomen compatible with cholecystectomy. L5-S1 disc prosthesis. Penile implant. Large body habitus. Moderate degenerative change of the hips. No acute osseous process. IMPRESSION: Normal bowel gas pattern. Electronically Signed   By: Elon Alas M.D.   On: 11/04/2016 22:56    Assessment/Plan:  66 y.o. male s/p I/D of suprapubic abcess.  Overall doing well post-op. Wound c/s for evaluation of possible wound vac given depth of wound. Recommend continued broad spectrum abx with plan for narrowing based on wound culture data.   - Wound care c/s for possible vac vs wound care recommendations  - Continue IV abx, narrow based on data  - Please continue to hold plavix  - Will continue to exam serially and determine ultimate fate of IPP, at this time hopefull that explant will not be required.    Dispo: per primary team   LOS: 1 day   Alla Feeling, MD 11/06/2016, 8:25 AM

## 2016-11-06 NOTE — Progress Notes (Signed)
Pt noted for hx of OSA.  Initially, he brought in his home cpap machine but told his wife to take it back home today.  Pt was offered a hospital-provided cpap to wear tonight, but he declined.  Pt stated he prefers to wear oxygen instead.  Pt placed on 2lnc.  RN aware, in room.  Pt was encouraged to call should he change his mind about cpap tonight.

## 2016-11-06 NOTE — Progress Notes (Signed)
[facility] PROGRESS NOTE  Hunter [name] OZH:[id number] DOB: 1950/11/04 DOA: 11/04/2016 PCP: [name], MD  Interim summary history of present illness 66 y.o. 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  No recent surgeries in past year. Patient presents to the ED with c/o severe pain in R groin.  Associated swelling, redness, fever up to 102 at home.  No N/V.  Is passing gas but no BM in 3 days Patient admitted secondary to pelvic abscess tracking along the spermatic cord and possibly involving penile implant.  Assessment/Plan: 1-abscess of the suprapubic area and pevic wall -Status post I&D by urologist -Recommendation is to continue current IV broad-spectrum antibiotics until cultures is back. -Continue supportive care -Patient afebrile and with normal WBCs. -Follow-up recommendations by urologist for further wound care and needs of further interventions.  2-history of coronary artery disease -No chest pain, no shortness of breath -Continue holding on aspirin until clear by urology to resume it -Will continue statins  3-type 2 diabetes -Continue sliding scale and Levemir -Follow CBGs and adjust hypoglycemic regimen as needed  4-acute kidney injury -Most likely associated with infection, some NSAIDs use at home for pain, continue use of nephrotoxic agents (diuretic) and mild dehydration. -Improve/resolve with fluid resuscitation. -repeat BMET intermittently to follow renal function trend -Cr 0.98 currently  5-hypertension -Stable and well-controlled -Will continue the use of metoprolol -planning to resume diuretics if BP and renal functions remained stable in the next 24 hours.  6-BPH -continue cardura  Code Status: Full code Family Communication: No family at bedside Disposition Plan: Home at discharge most likely with home health services; continue current  IV antibiotics while awaiting culture report.  Will follow recommendations by urology service regarding further wound care and needs.   Consultants:  Urology  Procedures:  I&D of suprapubic abscess 11/5  Antibiotics:  Vancomycin and Zosyn 11/4  HPI/Subjective: Afebrile, no chest pain, no shortness of breath, no nausea, no vomiting.  Patient reported mild pressure/tenderness on his suprapubic area.  Objective: Vitals:   11/06/16 1431 11/06/16 1548  BP: (!) 126/44   Pulse: (!) 48   Resp:    Temp:  (!) 97.3 F (36.3 C)  SpO2: 100%     Intake/Output Summary (Last 24 hours) at 11/06/2016 1712 Last data filed at 11/06/2016 1431 Gross per 24 hour  Intake 5080 ml  Output 304 ml  Net 4776 ml   Filed Weights   11/04/16 1925 11/05/16 0335  Weight: 96.6 kg (213 lb) 93.4 kg (206 lb)    Exam:   General: Afebrile, no chest pain, no shortness of breath, no nausea, no vomiting.  Patient feel a lot better and reporting just some mild pressure/tenderness in suprapubic area.  Tolerated I&D of suprapubic abscess by urologist on 11/5.  Cardiovascular: S1 and S2, no rubs, no gallops, no JVD appreciated on exam.  Respiratory: Good air movement bilaterally, no wheezing, no crackles.  Normal respiratory effort.  Abdomen: Obese, soft, nontender, positive bowel sounds.   Musculoskeletal: No edema, no cyanosis, no clubbing.  Skin: Patient with clean dressings on his suprapubic area were a wound of 2 cm x 5 cm x 4.5 cm has been made in attempt to drain a suprapubic abscess.  There is a light yellowish discharge without any significant surrounding erythema.  Data Reviewed: Basic Metabolic Panel: Recent Labs  Lab 11/04/16 1933 11/06/16 0449 11/06/16 0756  NA 135  --  138  K 3.4*  --  4.1  CL 102  --  110  CO2 22  --  19*  GLUCOSE 151*  --  134*  BUN 18  --  16  CREATININE 1.31* 1.02 0.98  CALCIUM 8.9  --  7.8*   Liver Function Tests: Recent Labs  Lab 11/04/16 1933  AST 20   ALT 25  ALKPHOS 44  BILITOT 0.7  PROT 7.5  ALBUMIN 3.2*   Recent Labs  Lab 11/04/16 1933  LIPASE 20   CBC: Recent Labs  Lab 11/04/16 1933 11/06/16 0756  WBC 8.1 6.9  NEUTROABS  --  5.8  HGB 14.8 12.9*  HCT 44.9 39.3  MCV 94.1 94.0  PLT 180 171   CBG: Recent Labs  Lab 11/06/16 0004 11/06/16 0406 11/06/16 0811 11/06/16 1200 11/06/16 1650  GLUCAP 186* 130* 119* 150* 138*    Recent Results (from the past 240 hour(s))  Aerobic/Anaerobic Culture (surgical/deep wound)     Status: None (Preliminary result)   Collection Time: 11/05/16  3:26 PM  Result Value Ref Range Status   Specimen Description ABSCESS SUPRAPUBIC  Final   Special Requests NONE  Final   Gram Stain   Final    ABUNDANT WBC PRESENT,BOTH PMN AND MONONUCLEAR NO ORGANISMS SEEN    Culture   Final    NO GROWTH < 24 HOURS Performed at [facility], [place] [address] [place], [place] [address]    Report Status PENDING  Incomplete     Studies: Ct Abdomen Pelvis W Contrast  Result Date: 11/04/2016 CLINICAL DATA:  Lower abdominal pain. No bowel movement for 3 days. History of inguinal hernia and repair. Fever and chills. EXAM: CT ABDOMEN AND PELVIS WITH CONTRAST TECHNIQUE: Multidetector CT imaging of the abdomen and pelvis was performed using the standard protocol following bolus administration of intravenous contrast. CONTRAST:  169mL ISOVUE-300 IOPAMIDOL (ISOVUE-300) INJECTION 61% COMPARISON:  Acute abdominal series November 04, 2016 at 2157 hours and CT abdomen and pelvis March 28, 2012 FINDINGS: LOWER CHEST: Lung bases are clear. Included heart size is normal. No pericardial effusion. HEPATOBILIARY: Status post cholecystectomy.  Normal liver. PANCREAS: Normal. SPLEEN: Normal.  Central splenic fat containing cleft. ADRENALS/URINARY TRACT: Kidneys are orthotopic, demonstrating symmetric enhancement. Dense 6 x 9 mm nodule exophytic from upper pole of LEFT kidney was 8 x 12 mm. No nephrolithiasis, hydronephrosis  or solid renal masses. The unopacified ureters are normal in course and caliber. Delayed imaging through the kidneys demonstrates symmetric prompt contrast excretion within the proximal urinary collecting system. Urinary bladder is well distended and unremarkable. Subcentimeter bilateral adrenal gland fat containing nodules. STOMACH/BOWEL: Cecum is tethered toward the RIGHT inguinal ring. The stomach, small bowel are normal in course and caliber without inflammatory changes. Normal appendix superior to the cecum. VASCULAR/LYMPHATIC: Aortoiliac vessels are normal in course and caliber. Severe calcific atherosclerosis with possible renal artery stenosis, unchanged. No lymphadenopathy by CT size criteria. REPRODUCTIVE: Normal. Penile implant, intact reservoir RIGHT pelvis. OTHER: No intraperitoneal free fluid or free air. 2.5 x 3.3 cm rim enhancing fluid collection RIGHT anterior pelvis subcutaneous fat tracking along the inguinal canal with thickened and RIGHT pelvic wall fascia. Collection is greater than 2 cm anterior to penile catheter. No subcutaneous gas or radiopaque foreign bodies. Surrounding soft tissue swelling with subcutaneous fat stranding. Stable small fat containing LEFT inguinal hernia. RIGHT anterior abdominal wall subcutaneous fat stranding at the level of the umbilicus. MUSCULOSKELETAL: Nonacute. Status post median sternotomy. RIGHT femur rod tract.  Old RIGHT superior and inferior pubic rami fractures. Old RIGHT acetabulum fracture. L5-S1 laminectomy, disc prosthesis. 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 IMPRESSION: 1. Multifocal RIGHT anterior abdominopelvic wall cellulitis and RIGHT anterior pelvic wall 2.5 x 3.3 cm abscess. Cellulitis/phlegmon tracking along spermatic cord. No intraperitoneal extension. 2. Cecum tethered to the inguinal ring compatible with adhesion. Penile implant, intact reservoir RIGHT pelvis. 3. No acute intra-abdominal or pelvic  process. Aortic Atherosclerosis (ICD10-I70.0). Electronically Signed   By: Elon Alas M.D.   On: 11/04/2016 23:56   Dg Abd 2 Views  Result Date: 11/04/2016 CLINICAL DATA:  Abdominal distension. RIGHT inguinal pain for 2 days with mass. Remote history of inguinal hernia. EXAM: ABDOMEN - 2 VIEW COMPARISON:  None CT abdomen and pelvis March 28, 2012. FINDINGS: Included lung bases demonstrate cardiomegaly, s/p CABG for CHF. Mild interstitial prominence. Bowel gas pattern is nondilated and nonobstructive. No intra-abdominal mass effect or pathologic calcifications. Surgical clips in the included right abdomen compatible with cholecystectomy. L5-S1 disc prosthesis. Penile implant. Large body habitus. Moderate degenerative change of the hips. No acute osseous process. IMPRESSION: Normal bowel gas pattern. Electronically Signed   By: Elon Alas M.D.   On: 11/04/2016 22:56    Scheduled Meds: . atorvastatin  40 mg Oral q1800  . docusate sodium  100 mg Oral QPM  . doxazosin  2 mg Oral BID  . fluticasone  1 spray Each Nare BID  . gabapentin  400 mg Oral TID  . insulin aspart  0-15 Units Subcutaneous Q4H  . insulin detemir  17 Units Subcutaneous Daily  . metoprolol tartrate  50 mg Oral BID  . oxybutynin  15 mg Oral QHS  . pantoprazole  80 mg Oral BID  . saccharomyces boulardii  250 mg Oral BID  . umeclidinium-vilanterol  1 puff Inhalation Daily  . varenicline  1 mg Oral BID  . vitamin B-12  1,000 mcg Oral Daily  . [START ON 11/07/2016] Vitamin D (Ergocalciferol)  50,000 Units Oral Q Wed   Continuous Infusions: . sodium chloride 125 mL/hr at 11/06/16 0136  . piperacillin-tazobactam (ZOSYN)  IV 3.375 g (11/06/16 1448)  . vancomycin Stopped (11/06/16 0500)    Time spent: 30 minutes   Barton Dubois  [facility] Pager [phone]. If 7PM-7AM, please contact night-coverage at [email or web address], password [facility] 11/06/2016, 5:12 PM  LOS: 1 day

## 2016-11-06 NOTE — Consult Note (Signed)
[facility] Nurse wound consult note Reason for Consult: full thickness wound on pubis.  S/P I&D Wound type:infectious Pressure Injury POA: N/A Measurement:2cm x 5cm x 4.5cm Wound bed:red. moist Drainage (amount, consistency, odor) serous to light yellow Periwound:intact, firm Dressing procedure/placement/frequency: I will provide Nursing with guidance via the Orders for three times daily wound care using saline moistened gauze and securing with mesh briefs rather than tape. At home, patient should shower using an antibacterial soap.  Nursing to instruct a family member in performing wound care as we near discharge. Patient to follow up with PCP after discharge.  If you agree, please order/arrange. If no family member to perform wound care, suggest [facility] who can perform for a few days while family locates someone to instruct. Consult Care Management if that is required. [facility] nursing team will not follow, but will remain available to this patient, the nursing and medical teams.  Please re-consult if needed. Thanks, Maudie Flakes, MSN, RN, [place], Ar[facility] Abbott  Pager# [phone]

## 2016-11-07 DIAGNOSIS — T8361XA Infection and inflammatory reaction due to implanted penile prosthesis, initial encounter: Secondary | ICD-10-CM

## 2016-11-07 LAB — GLUCOSE, CAPILLARY
GLUCOSE-CAPILLARY: 74 mg/dL (ref 65–99)
Glucose-Capillary: 116 mg/dL — ABNORMAL HIGH (ref 65–99)
Glucose-Capillary: 145 mg/dL — ABNORMAL HIGH (ref 65–99)
Glucose-Capillary: 96 mg/dL (ref 65–99)
Glucose-Capillary: 99 mg/dL (ref 65–99)

## 2016-11-07 LAB — CREATININE, SERUM
CREATININE: 0.92 mg/dL (ref 0.61–1.24)
GFR calc Af Amer: >60 mL/min (ref >60)
GFR calc non Af Amer: >60 mL/min (ref >60)

## 2016-11-07 MED ORDER — LOPERAMIDE HCL 2 MG PO CAPS
2.0000 mg | ORAL_CAPSULE | Freq: Once | ORAL | Status: AC
  Administered 2016-11-07: 2 mg via ORAL
  Filled 2016-11-07: qty 1

## 2016-11-07 MED ORDER — SULFAMETHOXAZOLE-TRIMETHOPRIM 800-160 MG PO TABS
1.0000 | ORAL_TABLET | Freq: Two times a day (BID) | ORAL | Status: DC
  Administered 2016-11-07: 1 via ORAL
  Filled 2016-11-07: qty 1

## 2016-11-07 MED ORDER — SULFAMETHOXAZOLE-TRIMETHOPRIM 800-160 MG PO TABS: 1.0000 | ORAL_TABLET | Freq: Two times a day (BID) | ORAL | 0 refills | Status: DC

## 2016-11-07 MED ORDER — CLOPIDOGREL BISULFATE 75 MG PO TABS
75.0000 mg | ORAL_TABLET | Freq: Every day | ORAL | Status: DC
  Administered 2016-11-07: 75 mg via ORAL
  Filled 2016-11-07: qty 1

## 2016-11-07 MED ORDER — TRAMADOL HCL 50 MG PO TABS: 50.0000 mg | ORAL_TABLET | Freq: Three times a day (TID) | ORAL | 4 refills | Status: DC | PRN

## 2016-11-07 MED ORDER — SACCHAROMYCES BOULARDII 250 MG PO CAPS: 250.0000 mg | ORAL_CAPSULE | Freq: Two times a day (BID) | ORAL | 0 refills | Status: DC

## 2016-11-07 NOTE — Discharge Summary (Signed)
Physician Discharge Summary  Hunter [name] QIW:[id number] DOB: 05/17/1950 DOA: 11/04/2016  PCP: [name], MD  Admit date: 11/04/2016 Discharge date: 11/07/2016  Recommendations for Outpatient Follow-up:  1. Pt will need to follow up with PCP in 1-2 weeks post discharge 2. Please obtain BMP to evaluate electrolytes and kidney function, K level  3. Pt was discharged on ABX Bactrim to complete therapy, per urology recommendations  4. Please check HR and if HR < 55, may need to adjust the dose of Metoprolol   Discharge Diagnoses:  Principal Problem:   Infected penile implant, initial encounter ([place]) Active Problems:   Chronic ischemic heart disease   Diabetes mellitus with peripheral artery disease ([place])  Discharge Condition: Stable  Diet recommendation: Heart healthy diet discussed in details   History of present illness:  Pt is 66 yo male with DM type II, penile implant prosthesis put in many years ago, presented to ED with severe R groin pain associated with fevers, chills, swelling and redness.   Patient admitted secondary to pelvic abscess tracking along the spermatic cord and possibly involving penile implant.  Assessment/Plan: Abscess of the suprapubic area and pevic wall - Status post I&D by urologist - has been on IV ABX and per urology, plan to transition to oral Bactrim on discharge  - pt wants to home, urology cleared for discharge - pt will follow up in an outpatient setting   History of coronary artery disease - no anginal concerns  - continue statin  - continue aspirin and plavix   Type 2 diabetes - resume home medical regimen insulin and jardiance  Acute kidney injury - pre renal etiology - resolved   Hypertension - resume home medical regimen   BPH - continue cardura  Code Status: Full code Family Communication: pt at bedside  Disposition Plan: pt wants to go home   Consultants:  Urology  Procedures:  I&D of suprapubic  abscess 11/5  Antibiotics:  Vancomycin and Zosyn 11/4, changed to Bactrim on discharge   Procedures/Studies: Ct Abdomen Pelvis W Contrast  Result Date: 11/04/2016 CLINICAL DATA:  Lower abdominal pain. No bowel movement for 3 days. History of inguinal hernia and repair. Fever and chills. EXAM: CT ABDOMEN AND PELVIS WITH CONTRAST TECHNIQUE: Multidetector CT imaging of the abdomen and pelvis was performed using the standard protocol following bolus administration of intravenous contrast. CONTRAST:  197mL ISOVUE-300 IOPAMIDOL (ISOVUE-300) INJECTION 61% COMPARISON:  Acute abdominal series November 04, 2016 at 2157 hours and CT abdomen and pelvis March 28, 2012 FINDINGS: LOWER CHEST: Lung bases are clear. Included heart size is normal. No pericardial effusion. HEPATOBILIARY: Status post cholecystectomy.  Normal liver. PANCREAS: Normal. SPLEEN: Normal.  Central splenic fat containing cleft. ADRENALS/URINARY TRACT: Kidneys are orthotopic, demonstrating symmetric enhancement. Dense 6 x 9 mm nodule exophytic from upper pole of LEFT kidney was 8 x 12 mm. No nephrolithiasis, hydronephrosis or solid renal masses. The unopacified ureters are normal in course and caliber. Delayed imaging through the kidneys demonstrates symmetric prompt contrast excretion within the proximal urinary collecting system. Urinary bladder is well distended and unremarkable. Subcentimeter bilateral adrenal gland fat containing nodules. STOMACH/BOWEL: Cecum is tethered toward the RIGHT inguinal ring. The stomach, small bowel are normal in course and caliber without inflammatory changes. Normal appendix superior to the cecum. VASCULAR/LYMPHATIC: Aortoiliac vessels are normal in course and caliber. Severe calcific atherosclerosis with possible renal artery stenosis, unchanged. No lymphadenopathy by CT size criteria. REPRODUCTIVE: Normal. Penile implant, intact reservoir RIGHT pelvis. OTHER: No intraperitoneal  free fluid or free air. 2.5 x 3.3 cm  rim enhancing fluid collection RIGHT anterior pelvis subcutaneous fat tracking along the inguinal canal with thickened and RIGHT pelvic wall fascia. Collection is greater than 2 cm anterior to penile catheter. No subcutaneous gas or radiopaque foreign bodies. Surrounding soft tissue swelling with subcutaneous fat stranding. Stable small fat containing LEFT inguinal hernia. RIGHT anterior abdominal wall subcutaneous fat stranding at the level of the umbilicus. MUSCULOSKELETAL: Nonacute. Status post median sternotomy. RIGHT femur rod tract. Old RIGHT superior and inferior pubic rami fractures. Old RIGHT acetabulum fracture. L5-S1 laminectomy, disc prosthesis. 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 IMPRESSION: 1. Multifocal RIGHT anterior abdominopelvic wall cellulitis and RIGHT anterior pelvic wall 2.5 x 3.3 cm abscess. Cellulitis/phlegmon tracking along spermatic cord. No intraperitoneal extension. 2. Cecum tethered to the inguinal ring compatible with adhesion. Penile implant, intact reservoir RIGHT pelvis. 3. No acute intra-abdominal or pelvic process. Aortic Atherosclerosis (ICD10-I70.0). Electronically Signed   By: Elon Alas M.D.   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 EXAM: ULTRASOUND-GUIDED RANDOM LIVER BIOPSY MEDICATIONS: None. ANESTHESIA/SEDATION: Fentanyl 2.0 mcg IV; Versed 75 mg IV Moderate Sedation Time:  15 minutes The patient was continuously monitored during the procedure by the interventional radiology nurse under my direct supervision. FLUOROSCOPY TIME:  None COMPLICATIONS: None immediate. PROCEDURE: Informed written consent was obtained from the patient after a thorough discussion of the procedural risks, benefits and alternatives. All questions were addressed. A timeout was performed prior to the initiation of the procedure. The liver was  evaluated with ultrasound. The right hepatic lobe was selected for biopsy. The right side of the abdomen was prepped with chlorhexidine and sterile field was created. Skin and soft tissues were anesthetized with 1% lidocaine. 69 gauge coaxial needle directed into the right hepatic lobe with ultrasound guidance. Three core biopsies obtained with an 18 gauge device. Specimens placed in formalin. 17 gauge needle was removed without complication. Bandage placed over the puncture site. FINDINGS: Liver is mildly heterogeneous. Core biopsies obtained from the right hepatic lobe. No bleeding or hematoma formation following the core biopsies. IMPRESSION: Successful ultrasound-guided core biopsies of the right hepatic lobe. Electronically Signed   By: Markus Daft M.D.   On: 10/11/2016 14:35   Dg Abd 2 Views  Result Date: 11/04/2016 CLINICAL DATA:  Abdominal distension. RIGHT inguinal pain for 2 days with mass. Remote history of inguinal hernia. EXAM: ABDOMEN - 2 VIEW COMPARISON:  None CT abdomen and pelvis March 28, 2012. FINDINGS: Included lung bases demonstrate cardiomegaly, s/p CABG for CHF. Mild interstitial prominence. Bowel gas pattern is nondilated and nonobstructive. No intra-abdominal mass effect or pathologic calcifications. Surgical clips in the included right abdomen compatible with cholecystectomy. L5-S1 disc prosthesis. Penile implant. Large body habitus. Moderate degenerative change of the hips. No acute osseous process. IMPRESSION: Normal bowel gas pattern. Electronically Signed   By: Elon Alas M.D.   On: 11/04/2016 22:56     Discharge Exam: Vitals:   11/07/16 0918 11/07/16 0959  BP:  (!) 147/54  Pulse:    Resp:    Temp:    SpO2: 98%    Vitals:   11/06/16 2020 11/07/16 0434 11/07/16 0918 11/07/16 0959  BP: 122/60 138/66  (!) 147/54  Pulse: (!) 47 (!) 57    Resp: 20 20    Temp: (!) 96.1 F (35.6 C) 98.1 F (36.7 C)  TempSrc: Oral Axillary    SpO2: 99% 100% 98%   Weight:       Height:        General: Pt is alert, follows commands appropriately, not in acute distress Cardiovascular: bradycardic, S1/S2 +, no murmurs, no rubs, no gallops Respiratory: Clear to auscultation bilaterally, no wheezing, no crackles, no rhonchi Abdominal: Soft, non tender, non distended, bowel sounds +, no guarding Extremities: no edema, no cyanosis, pulses palpable bilaterally DP and PT Neuro: Grossly nonfocal  Discharge Instructions  Discharge Instructions    Diet - low sodium heart healthy   Complete by:  As directed    Increase activity slowly   Complete by:  As directed      Allergies as of 11/07/2016      Reactions   Codeine Nausea And Vomiting, Other (See Comments)   Severe stomach cramps   Lisinopril Other (See Comments)   cough   Ramipril Other (See Comments)   cough   Tape Other (See Comments), Tinitus   Only plastic tape--can use paper tape OK Blisters      Medication List    TAKE these medications   ANORO ELLIPTA 62.5-25 MCG/INH Aepb Generic drug:  umeclidinium-vilanterol Inhale 1 puff into the lungs daily.   aspirin 81 MG tablet Take 81 mg by mouth every evening.   atorvastatin 40 MG tablet Commonly known as:  LIPITOR TAKE 1 TABLET EVERY DAY AT 6 PM What changed:    how much to take  how to take this  when to take this  additional instructions   cetirizine 10 MG tablet Commonly known as:  ZYRTEC Take 10 mg by mouth daily.   clopidogrel 75 MG tablet Commonly known as:  PLAVIX Take 1 tablet (75 mg total) by mouth daily.   doxazosin 2 MG tablet Commonly known as:  CARDURA Take 1 tablet (2 mg total) by mouth at bedtime. What changed:  when to take this   fluticasone 50 MCG/ACT nasal spray Commonly known as:  FLONASE Place 1 spray into both nostrils 2 (two) times daily.   gabapentin 400 MG capsule Commonly known as:  NEURONTIN Take 400 mg by mouth 3 (three) times daily.   HUMALOG KWIKPEN 100 UNIT/ML KiwkPen Generic drug:  insulin  lispro Inject 24 Units into the skin 2 (two) times daily.   ibuprofen 200 MG tablet Commonly known as:  ADVIL,MOTRIN Take 400 mg by mouth every 6 (six) hours as needed for headache or moderate pain.   ipratropium 0.06 % nasal spray Commonly known as:  ATROVENT Place 1-3 sprays into both nostrils 3 (three) times daily as needed for rhinitis.   JARDIANCE 25 MG Tabs tablet Generic drug:  empagliflozin Take 25 mg by mouth daily.   ketotifen 0.025 % ophthalmic solution Commonly known as:  ZADITOR Place 2 drops as needed into both eyes (for allergies).   LEVEMIR FLEXPEN 100 UNIT/ML Pen Generic drug:  Insulin Detemir Inject 34 Units into the skin every morning.   metoprolol tartrate 50 MG tablet Commonly known as:  LOPRESSOR Take 1 tablet (50 mg total) by mouth 2 (two) times daily.   nitroGLYCERIN 0.4 MG SL tablet Commonly known as:  NITROSTAT Place 1 tablet (0.4 mg total) under the tongue every 5 (five) minutes as needed for chest pain (chest pain).   omeprazole 40 MG capsule Commonly known as:  PRILOSEC Take 40 mg by mouth 2 (two) times daily.   oxybutynin 15 MG 24 hr tablet Commonly known as:  DITROPAN XL  Take 15 mg by mouth at bedtime.   saccharomyces boulardii 250 MG capsule Commonly known as:  FLORASTOR Take 1 capsule (250 mg total) 2 (two) times daily by mouth.   STOOL SOFTENER PO Take 100 mg by mouth every evening.   sulfamethoxazole-trimethoprim 800-160 MG tablet Commonly known as:  BACTRIM DS,SEPTRA DS Take 1 tablet every 12 (twelve) hours by mouth.   tamsulosin 0.4 MG Caps capsule Commonly known as:  FLOMAX Take 0.4 mg by mouth daily.   traMADol 50 MG tablet Commonly known as:  ULTRAM Take 1 tablet (50 mg total) 3 (three) times daily as needed by mouth for moderate pain.   triamterene-hydrochlorothiazide 37.5-25 MG tablet Commonly known as:  MAXZIDE-25 Take 1 tablet by mouth daily.   varenicline 1 MG tablet Commonly known as:  CHANTIX Take 1 mg by  mouth 2 (two) times daily.   vitamin B-12 1000 MCG tablet Commonly known as:  CYANOCOBALAMIN Take 1,000 mcg by mouth daily.   Vitamin D (Ergocalciferol) 50000 units Caps capsule Commonly known as:  DRISDOL Take 50,000 Units by mouth every Wednesday.        Follow-up Information    [name], MD Follow up.   Specialty:  Internal Medicine Contact information: [place] [address] [phone]        Lucas Mallow, MD. Schedule an appointment as soon as possible for a visit.   Specialty:  Urology Contact information: [place] [address] [phone]        Theodis Blaze, MD Follow up.   Specialty:  Internal Medicine Why:  call me with questions [phone] Contact information: [address] [place] [address] [phone]          The results of significant diagnostics from this hospitalization (including imaging, microbiology, ancillary and laboratory) are listed below for reference.    Microbiology: Recent Results (from the past 240 hour(s))  Aerobic/Anaerobic Culture (surgical/deep wound)     Status: None (Preliminary result)   Collection Time: 11/05/16  3:26 PM  Result Value Ref Range Status   Specimen Description ABSCESS SUPRAPUBIC  Final   Special Requests NONE  Final   Gram Stain   Final    ABUNDANT WBC PRESENT,BOTH PMN AND MONONUCLEAR NO ORGANISMS SEEN    Culture   Final    NO GROWTH < 24 HOURS Performed at [facility], [address], [place], [place] [address]    Report Status PENDING  Incomplete     Labs: Basic Metabolic Panel: Recent Labs  Lab 11/04/16 1933 11/06/16 0449 11/06/16 0756 11/07/16 0652  NA 135  --  138  --   K 3.4*  --  4.1  --   CL 102  --  110  --   CO2 22  --  19*  --   GLUCOSE 151*  --  134*  --   [name] 18  --  16  --   CREATININE 1.31* 1.02 0.98 0.92  CALCIUM 8.9  --  7.8*  --    Liver Function Tests: Recent Labs  Lab  11/04/16 1933  AST 20  ALT 25  ALKPHOS 44  BILITOT 0.7  PROT 7.5  ALBUMIN 3.2*   Recent Labs  Lab 11/04/16 1933  LIPASE 20   CBC: Recent Labs  Lab 11/04/16 1933 11/06/16 0756  WBC 8.1 6.9  NEUTROABS  --  5.8  HGB 14.8 12.9*  HCT 44.9 39.3  MCV 94.1  94.0  PLT 180 171    CBG: Recent Labs  Lab 11/06/16 2058 11/07/16 0024 11/07/16 0438 11/07/16 0537 11/07/16 0724  GLUCAP 130* 96 74 116* 99   SIGNED: Time coordinating discharge: 60 minutes  Iskra Magick-Myers, MD  [facility] 11/07/2016, 11:10 AM Pager [phone]  If 7PM-7AM, please contact night-coverage [email or web address] Password [email or web address]

## 2016-11-07 NOTE — Consult Note (Signed)
Urology Consult Note   2 Days Post-Op s/p ID of suprapubic abscess. IPP and tubing not involved.   Subjective: AF, VSS. Packing of suprapubic wound going well. No purulence noted. OR cx NGx24 hours.   Exam much improved with resolved erythema around suprapubic region and induration all but gone.   Objective: Vital signs in last 24 hours: Temp:  [96.1 F (35.6 C)-98.1 F (36.7 C)] 98.1 F (36.7 C) (11/07 0434) Pulse Rate:  [47-57] 57 (11/07 0434) Resp:  [19-20] 20 (11/07 0434) BP: (122-145)/(44-70) 138/66 (11/07 0434) SpO2:  [98 %-100 %] 100 % (11/07 0434)  Intake/Output from previous day: 11/06 0701 - 11/07 0700 In: 3890 [P.O.:240; I.V.:3000; IV Piggyback:650] Out: 8 [Urine:4; Stool:4] Intake/Output this shift: No intake/output data recorded.  Physical Exam:  General: Alert and oriented CV: RRR Lungs: Clear Abdomen: Soft, non tender GU: Suprapubic incision with no purulence or drainage. No pain to palpation of IPP components.No erythema   Packing changed at bedside, WtD Ext: NT, No erythema  Lab Results: Recent Labs    11/04/16 1933 11/06/16 0756  HGB 14.8 12.9*  HCT 44.9 39.3   BMET Recent Labs    11/04/16 1933  11/06/16 0756 11/07/16 0652  NA 135  --  138  --   K 3.4*  --  4.1  --   CL 102  --  110  --   CO2 22  --  19*  --   GLUCOSE 151*  --  134*  --   BUN 18  --  16  --   CREATININE 1.31*   < > 0.98 0.92  CALCIUM 8.9  --  7.8*  --    < > = values in this interval not displayed.     Studies/Results: No results found.  Assessment/Plan:  66 y.o. male s/p I/D of suprapubic abcess.  Overall doing well post-op. WOCN recommend TID WtD dressing changes. The patient wife has performed this before.   No fevers and exam much improved. Wound appears to be healing, although given its depth this may time some time.   Recommend transition patient to oral abx and ensuring that his family can per  - Continue local wound care per WOCN instructions  - Please  ensure patients family can perform wound care - Recommend transition to oral abx - Bactrim - OK to restart plavix vs DVT prophylaxis  - Patient will follow up with urology in near future.   Dispo: per primary team   LOS: 2 days   Alla Feeling, MD 11/07/2016, 7:30 AM

## 2016-11-07 NOTE — Care Management Note (Signed)
Case Management Note  Patient Details  Name: Hunter [name] MRN: [id number] Date of Birth: 1950/07/05  Subjective/Objective:    Pt admitted with Infected penile implant                 Action/Plan: Plan to discharge home with no [place] at home for dressing changes and wife.   Expected Discharge Date:  11/07/16               Expected Discharge Plan:  [place]  In-House Referral:     Discharge planning Services  CM Consult  Post Acute Care Choice:    Choice offered to:  Patient  DME Arranged:    DME Agency:     HH Arranged:  RN [place] Agency:  [facility] (now [facility])  Status of Service:  In process, will continue to follow  If discussed at Long Length of Stay Meetings, dates discussed:    Additional CommentsPurcell Mouton, RN 11/07/2016, 12:02 PM

## 2016-11-07 NOTE — Discharge Instructions (Signed)
Skin Abscess A skin abscess is an infected area on or under your skin that contains pus and other material. An abscess can happen almost anywhere on your body. Some abscesses break open (rupture) on their own. Most continue to get worse unless they are treated. The infection can spread deeper into the body and into your blood, which can make you feel sick. Treatment usually involves draining the abscess. Follow these instructions at home: Abscess Care  If you have an abscess that has not drained, place a warm, clean, wet washcloth over the abscess several times a day. Do this as told by your doctor.  Follow instructions from your doctor about how to take care of your abscess. Make sure you: ? Cover the abscess with a bandage (dressing). ? Change your bandage or gauze as told by your doctor. ? Wash your hands with soap and water before you change the bandage or gauze. If you cannot use soap and water, use hand sanitizer.  Check your abscess every day for signs that the infection is getting worse. Check for: ? More redness, swelling, or pain. ? More fluid or blood. ? Warmth. ? More pus or a bad smell. Medicines   Take over-the-counter and prescription medicines only as told by your doctor.  If you were prescribed an antibiotic medicine, take it as told by your doctor. Do not stop taking the antibiotic even if you start to feel better. General instructions  To avoid spreading the infection: ? Do not share personal care items, towels, or hot tubs with others. ? Avoid making skin-to-skin contact with other people.  Keep all follow-up visits as told by your doctor. This is important. Contact a doctor if:  You have more redness, swelling, or pain around your abscess.  You have more fluid or blood coming from your abscess.  Your abscess feels warm when you touch it.  You have more pus or a bad smell coming from your abscess.  You have a fever.  Your muscles ache.  You have  chills.  You feel sick. Get help right away if:  You have very bad (severe) pain.  You see red streaks on your skin spreading away from the abscess. This information is not intended to replace advice given to you by your health care provider. Make sure you discuss any questions you have with your health care provider. Document Released: 06/06/2007 Document Revised: 08/14/2015 Document Reviewed: 10/27/2014 Elsevier Interactive Patient Education  2018 [employer]  

## 2016-11-08 ENCOUNTER — Ambulatory Visit: Payer: Medicare Other | Admitting: Podiatry

## 2016-11-08 LAB — AEROBIC/ANAEROBIC CULTURE W GRAM STAIN (SURGICAL/DEEP WOUND)

## 2016-11-08 LAB — AEROBIC/ANAEROBIC CULTURE (SURGICAL/DEEP WOUND)

## 2017-01-29 DIAGNOSIS — Z794 Long term (current) use of insulin: Secondary | ICD-10-CM | POA: Insufficient documentation

## 2017-01-31 ENCOUNTER — Other Ambulatory Visit: Payer: Self-pay

## 2017-01-31 ENCOUNTER — Emergency Department (HOSPITAL_COMMUNITY)
Admission: EM | Admit: 2017-01-31 | Discharge: 2017-01-31 | Disposition: A | Discharge status: Home / Self Care | Payer: Medicare Other | Attending: Emergency Medicine | Admitting: Emergency Medicine

## 2017-01-31 ENCOUNTER — Encounter (HOSPITAL_COMMUNITY): Payer: Self-pay | Admitting: Emergency Medicine

## 2017-01-31 DIAGNOSIS — Z5321 Procedure and treatment not carried out due to patient leaving prior to being seen by health care provider: Secondary | ICD-10-CM | POA: Insufficient documentation

## 2017-01-31 DIAGNOSIS — R509 Fever, unspecified: Secondary | ICD-10-CM | POA: Diagnosis present

## 2017-01-31 NOTE — ED Notes (Signed)
All of the triage questions/documentation was entered by Hanley Seamen, RN

## 2017-01-31 NOTE — ED Notes (Signed)
Told pt ans his wife they would be moving back to the waiting room until we could get them a room in the back. Pts wife questioned what would be done for the pt once he was placed in a room. Explained to pt and wife that I am unable to determine what the physician will see necessary. Pt and wife decided to go home. Pt stated "I can go home to drink water and eat soup. I already feel better than I did when I came in and I don't feel dizzy."

## 2017-01-31 NOTE — ED Triage Notes (Signed)
Per pt, states congestion, fever, SOB due to congestion-wears CPAP at night-states symptoms started last night-took Tylenol for a fever of 103-no pain at this time

## 2017-02-05 DIAGNOSIS — M79672 Pain in left foot: Secondary | ICD-10-CM | POA: Insufficient documentation

## 2017-02-08 ENCOUNTER — Ambulatory Visit (HOSPITAL_COMMUNITY)
Admission: RE | Admit: 2017-02-08 | Discharge: 2017-02-08 | Disposition: A | Discharge status: Home / Self Care | Payer: Medicare Other | Source: Ambulatory Visit | Attending: Vascular Surgery | Admitting: Vascular Surgery

## 2017-02-08 ENCOUNTER — Other Ambulatory Visit (HOSPITAL_COMMUNITY): Payer: Self-pay | Admitting: Internal Medicine

## 2017-02-08 DIAGNOSIS — M79672 Pain in left foot: Secondary | ICD-10-CM | POA: Diagnosis not present

## 2017-02-08 DIAGNOSIS — R600 Localized edema: Secondary | ICD-10-CM | POA: Insufficient documentation

## 2017-02-12 ENCOUNTER — Ambulatory Visit: Payer: Medicare Other | Admitting: Podiatry

## 2017-02-19 ENCOUNTER — Ambulatory Visit: Payer: Medicare Other | Admitting: Podiatry

## 2017-02-26 DIAGNOSIS — L03116 Cellulitis of left lower limb: Secondary | ICD-10-CM | POA: Insufficient documentation

## 2017-02-28 ENCOUNTER — Ambulatory Visit: Payer: Medicare Other | Admitting: Podiatry

## 2017-02-28 ENCOUNTER — Encounter: Payer: Self-pay | Admitting: Podiatry

## 2017-02-28 DIAGNOSIS — D689 Coagulation defect, unspecified: Secondary | ICD-10-CM

## 2017-02-28 DIAGNOSIS — E1151 Type 2 diabetes mellitus with diabetic peripheral angiopathy without gangrene: Secondary | ICD-10-CM

## 2017-02-28 DIAGNOSIS — B351 Tinea unguium: Secondary | ICD-10-CM | POA: Diagnosis not present

## 2017-02-28 DIAGNOSIS — M79676 Pain in unspecified toe(s): Secondary | ICD-10-CM | POA: Diagnosis not present

## 2017-02-28 NOTE — Progress Notes (Signed)
He presents today chief complaint of painful elongated toenails 1 through 5 bilateral.  Objective: Nails are thick yellow dystrophic-like mycotic painful palpation.  Nonpalpable pulses bilateral no open lesions or wounds.  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  Toenails clinically mycotic.  Plan: Debridement of toenails 1 through 5 bilateral cover service.

## 2017-05-30 ENCOUNTER — Encounter: Payer: Self-pay | Admitting: Podiatry

## 2017-05-30 ENCOUNTER — Ambulatory Visit: Payer: Medicare Other | Admitting: Podiatry

## 2017-05-30 DIAGNOSIS — M79676 Pain in unspecified toe(s): Secondary | ICD-10-CM | POA: Diagnosis not present

## 2017-05-30 DIAGNOSIS — D689 Coagulation defect, unspecified: Secondary | ICD-10-CM

## 2017-05-30 DIAGNOSIS — B351 Tinea unguium: Secondary | ICD-10-CM | POA: Diagnosis not present

## 2017-05-30 DIAGNOSIS — E1151 Type 2 diabetes mellitus with diabetic peripheral angiopathy without gangrene: Secondary | ICD-10-CM | POA: Diagnosis not present

## 2017-05-30 NOTE — Progress Notes (Signed)
He presents today chief complaint of painful elongated toenails.  Objective: Vital signs are stable he is alert and oriented x3 pulses are palpable.  Considerable edema bilateral lower extremity.  Toenails are long thick yellow dystrophic-like mycotic and painful palpation.  Assessment: Pain in limb secondary to onychomycosis.  Plan: Debridement of toenails 1 through 5 bilateral.  Follow-up with him on an as-needed basis.

## 2017-06-04 ENCOUNTER — Telehealth: Payer: Self-pay | Admitting: Cardiology

## 2017-06-04 DIAGNOSIS — I259 Chronic ischemic heart disease, unspecified: Secondary | ICD-10-CM

## 2017-06-04 DIAGNOSIS — R609 Edema, unspecified: Secondary | ICD-10-CM | POA: Insufficient documentation

## 2017-06-04 DIAGNOSIS — M7989 Other specified soft tissue disorders: Secondary | ICD-10-CM

## 2017-06-04 MED ORDER — POTASSIUM CHLORIDE CRYS ER 20 MEQ PO TBCR: EXTENDED_RELEASE_TABLET | ORAL | 0 refills | Status: DC

## 2017-06-04 MED ORDER — FUROSEMIDE 40 MG PO TABS: ORAL_TABLET | ORAL | 0 refills | Status: DC

## 2017-06-04 NOTE — Telephone Encounter (Signed)
New Message    Pt c/o swelling: STAT is pt has developed SOB within 24 hours  1) How much weight have you gained and in what time span? 15lb in 6 weeks  2) If swelling, where is the swelling located? Face, hands, feet and legs    3) Are you currently taking a fluid pill? yes  4) Are you currently SOB? Yes, can hear it   5) Do you have a log of your daily weights (if so, list)? No   6) Have you gained 3 pounds in a day or 5 pounds in a week? no  7) Have you traveled recently? no

## 2017-06-04 NOTE — Telephone Encounter (Signed)
Pt c/o SOB and swelling over the last 2 months.  States he has gained about 15lbs in 6 weeks.  Wt today was 234lbs, last weight he remembers was 225lbs about 6-8 weeks ago.  Pt currently on Maxzide 37.5/25mg  QD.  Has swelling in face, hands, legs, ankles and feet.  Pt audibly SOB on the phone to where he couldn't finish a sentence without stopping to take a breath.  Seen by diabetes dietician today and she told him to contact cardiology because he is retaining fluid.  Denies changes in diet.  Couldn't remember exact BP today but thinks it was around 152/80.  Does remember dietician telling him BP was more elevated than usual.  Denies CP, lightheadedness or dizziness.  Denies increased salt in diet.  Advised I will call back once discussed with Dr. [name]. Spoke with Dr. [name] and he said to have pt take Furosemide 40mg  BID x 3 days along with K+ 4mEq QD x 3 days and come for lab work in about a week.  Pt and wife verbalized understanding and were in agreement with plan.  Pt will have labs on 6/12.

## 2017-06-12 ENCOUNTER — Other Ambulatory Visit: Payer: Medicare Other | Admitting: *Deleted

## 2017-06-12 DIAGNOSIS — M7989 Other specified soft tissue disorders: Secondary | ICD-10-CM

## 2017-06-12 DIAGNOSIS — I259 Chronic ischemic heart disease, unspecified: Secondary | ICD-10-CM

## 2017-06-13 LAB — BASIC METABOLIC PANEL
BUN/Creatinine Ratio: 21 (ref 10–24)
BUN: 22 mg/dL (ref 8–27)
CALCIUM: 9.3 mg/dL (ref 8.6–10.2)
CO2: 24 mmol/L (ref 20–29)
CREATININE: 1.06 mg/dL (ref 0.76–1.27)
Chloride: 100 mmol/L (ref 96–106)
GFR calc Af Amer: 84 mL/min/{1.73_m2} (ref >59)
GFR, EST NON AFRICAN AMERICAN: 73 mL/min/{1.73_m2} (ref >59)
GLUCOSE: 99 mg/dL (ref 65–99)
Potassium: 4.7 mmol/L (ref 3.5–5.2)
Sodium: 139 mmol/L (ref 134–144)

## 2017-07-11 ENCOUNTER — Encounter: Payer: Self-pay | Admitting: Cardiology

## 2017-07-30 ENCOUNTER — Ambulatory Visit: Payer: Medicare Other | Admitting: Cardiology

## 2017-07-30 ENCOUNTER — Encounter: Payer: Self-pay | Admitting: Cardiology

## 2017-07-30 VITALS — BP 130/76 | HR 69 | Ht 66.0 in | Wt 236.0 lb

## 2017-07-30 DIAGNOSIS — I5033 Acute on chronic diastolic (congestive) heart failure: Secondary | ICD-10-CM

## 2017-07-30 MED ORDER — FUROSEMIDE 40 MG PO TABS: 40.0000 mg | ORAL_TABLET | Freq: Two times a day (BID) | ORAL | 3 refills | Status: DC

## 2017-07-30 MED ORDER — POTASSIUM CHLORIDE CRYS ER 20 MEQ PO TBCR: 20.0000 meq | EXTENDED_RELEASE_TABLET | Freq: Every day | ORAL | 3 refills | Status: DC
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
## 2017-07-30 NOTE — Patient Instructions (Signed)
Medication Instructions:  Start taking Lasix 40 mg, twice a day, by mouth  Labwork: Labs in the next week with appointment  Follow-Up: Appointment: 08/07/17 3:20    If you need a refill on your cardiac medications before your next appointment, please call your pharmacy.

## 2017-08-03 ENCOUNTER — Other Ambulatory Visit: Payer: Self-pay | Admitting: Cardiology

## 2017-08-07 ENCOUNTER — Ambulatory Visit: Payer: Medicare Other | Admitting: Cardiology

## 2017-08-07 ENCOUNTER — Encounter: Payer: Self-pay | Admitting: Cardiology

## 2017-08-07 VITALS — BP 130/50 | HR 65 | Ht 66.0 in | Wt 230.8 lb

## 2017-08-07 DIAGNOSIS — Z79899 Other long term (current) drug therapy: Secondary | ICD-10-CM

## 2017-08-07 DIAGNOSIS — I779 Disorder of arteries and arterioles, unspecified: Secondary | ICD-10-CM | POA: Diagnosis not present

## 2017-08-07 DIAGNOSIS — R600 Localized edema: Secondary | ICD-10-CM

## 2017-08-07 DIAGNOSIS — I5033 Acute on chronic diastolic (congestive) heart failure: Secondary | ICD-10-CM | POA: Diagnosis not present

## 2017-08-07 NOTE — Patient Instructions (Addendum)
Medication Instructions:  Your physician recommends that you continue on your current medications as directed. Please refer to the Current Medication list given to you today.   Labwork: Today: BMET  Testing/Procedures: None  Follow-Up: Appointment with Dr. [name]: 08/22/17 10:45   If you need a refill on your cardiac medications before your next appointment, please call your pharmacy.
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
## 2017-08-08 LAB — BASIC METABOLIC PANEL
BUN / CREAT RATIO: 23 (ref 10–24)
BUN: 28 mg/dL — ABNORMAL HIGH (ref 8–27)
CALCIUM: 9.2 mg/dL (ref 8.6–10.2)
CHLORIDE: 93 mmol/L — AB (ref 96–106)
CO2: 25 mmol/L (ref 20–29)
Creatinine, Ser: 1.22 mg/dL (ref 0.76–1.27)
GFR calc Af Amer: 71 mL/min/{1.73_m2} (ref >59)
GFR calc non Af Amer: 61 mL/min/{1.73_m2} (ref >59)
GLUCOSE: 173 mg/dL — AB (ref 65–99)
Potassium: 3.5 mmol/L (ref 3.5–5.2)
Sodium: 136 mmol/L (ref 134–144)

## 2017-08-22 ENCOUNTER — Ambulatory Visit: Payer: Medicare Other | Admitting: Cardiology

## 2017-08-22 ENCOUNTER — Encounter: Payer: Self-pay | Admitting: *Deleted

## 2017-08-22 ENCOUNTER — Encounter: Payer: Self-pay | Admitting: Cardiology

## 2017-08-22 VITALS — BP 150/64 | HR 71 | Ht 66.0 in | Wt 231.0 lb

## 2017-08-22 DIAGNOSIS — I259 Chronic ischemic heart disease, unspecified: Secondary | ICD-10-CM

## 2017-08-22 DIAGNOSIS — I5033 Acute on chronic diastolic (congestive) heart failure: Secondary | ICD-10-CM

## 2017-08-22 DIAGNOSIS — R0602 Shortness of breath: Secondary | ICD-10-CM

## 2017-08-22 DIAGNOSIS — Z01812 Encounter for preprocedural laboratory examination: Secondary | ICD-10-CM | POA: Diagnosis not present
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
## 2017-08-22 NOTE — Patient Instructions (Signed)
Medication Instructions:  The current medical regimen is effective;  continue present plan and medications.  Labwork: Please have blood work today (proBNP,BMP and CBC)  Testing/Procedures: Your physician has requested that you have an echocardiogram. Echocardiography is a painless test that uses sound waves to create images of your heart. It provides your doctor with information about the size and shape of your heart and how well your heart's chambers and valves are working. This procedure takes approximately one hour. There are no restrictions for this procedure.  Your physician has requested that you have a cardiac catheterization. Cardiac catheterization is used to diagnose and/or treat various heart conditions. Doctors may recommend this procedure for a number of different reasons. The most common reason is to evaluate chest pain. Chest pain can be a symptom of coronary artery disease (CAD), and cardiac catheterization can show whether plaque is narrowing or blocking your heart's arteries. This procedure is also used to evaluate the valves, as well as measure the blood flow and oxygen levels in different parts of your heart. For further information please visit [email or web address]. Please follow instruction sheet, as given.  Follow-Up: Follow up 2 to 3 weeks after your cardiac cath.  If you need a refill on your cardiac medications before your next appointment, please call your pharmacy.  Thank you for choosing [facility]!!
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
## 2017-08-23 LAB — CBC
HEMATOCRIT: 39 % (ref 37.5–51.0)
Hemoglobin: 13.1 g/dL (ref 13.0–17.7)
MCH: 30.7 pg (ref 26.6–33.0)
MCHC: 33.6 g/dL (ref 31.5–35.7)
MCV: 91 fL (ref 79–97)
Platelets: 223 10*3/uL (ref 150–450)
RBC: 4.27 x10E6/uL (ref 4.14–5.80)
RDW: 13.3 % (ref 12.3–15.4)
WBC: 5.9 10*3/uL (ref 3.4–10.8)

## 2017-08-23 LAB — BASIC METABOLIC PANEL
BUN/Creatinine Ratio: 21 (ref 10–24)
BUN: 25 mg/dL (ref 8–27)
CALCIUM: 10.5 mg/dL — AB (ref 8.6–10.2)
CO2: 28 mmol/L (ref 20–29)
CREATININE: 1.18 mg/dL (ref 0.76–1.27)
Chloride: 95 mmol/L — ABNORMAL LOW (ref 96–106)
GFR calc Af Amer: 73 mL/min/{1.73_m2} (ref >59)
GFR, EST NON AFRICAN AMERICAN: 63 mL/min/{1.73_m2} (ref >59)
Glucose: 211 mg/dL — ABNORMAL HIGH (ref 65–99)
Potassium: 4 mmol/L (ref 3.5–5.2)
Sodium: 142 mmol/L (ref 134–144)

## 2017-08-23 LAB — PRO B NATRIURETIC PEPTIDE: NT-Pro BNP: 299 pg/mL (ref 0–376)

## 2017-08-27 ENCOUNTER — Other Ambulatory Visit: Payer: Self-pay

## 2017-08-27 ENCOUNTER — Ambulatory Visit (HOSPITAL_COMMUNITY): Payer: Medicare Other | Attending: Cardiovascular Disease

## 2017-08-27 DIAGNOSIS — J449 Chronic obstructive pulmonary disease, unspecified: Secondary | ICD-10-CM | POA: Diagnosis not present

## 2017-08-27 DIAGNOSIS — Z951 Presence of aortocoronary bypass graft: Secondary | ICD-10-CM | POA: Diagnosis not present

## 2017-08-27 DIAGNOSIS — I10 Essential (primary) hypertension: Secondary | ICD-10-CM | POA: Diagnosis not present

## 2017-08-27 DIAGNOSIS — I252 Old myocardial infarction: Secondary | ICD-10-CM | POA: Diagnosis not present

## 2017-08-27 DIAGNOSIS — I2510 Atherosclerotic heart disease of native coronary artery without angina pectoris: Secondary | ICD-10-CM | POA: Insufficient documentation

## 2017-08-27 DIAGNOSIS — R0602 Shortness of breath: Secondary | ICD-10-CM | POA: Diagnosis not present

## 2017-08-29 NOTE — Progress Notes (Signed)
Pt has been made aware of normal result and verbalized understanding.  jw 08/29/17

## 2017-09-05 ENCOUNTER — Ambulatory Visit: Payer: Medicare Other | Admitting: Podiatry

## 2017-09-05 ENCOUNTER — Encounter: Payer: Self-pay | Admitting: Podiatry

## 2017-09-05 DIAGNOSIS — E1151 Type 2 diabetes mellitus with diabetic peripheral angiopathy without gangrene: Secondary | ICD-10-CM

## 2017-09-05 DIAGNOSIS — M79676 Pain in unspecified toe(s): Secondary | ICD-10-CM | POA: Diagnosis not present

## 2017-09-05 DIAGNOSIS — B351 Tinea unguium: Secondary | ICD-10-CM

## 2017-09-05 DIAGNOSIS — D689 Coagulation defect, unspecified: Secondary | ICD-10-CM

## 2017-09-06 ENCOUNTER — Ambulatory Visit (HOSPITAL_COMMUNITY)
Admission: RE | Admit: 2017-09-06 | Discharge: 2017-09-06 | Disposition: A | Discharge status: Home / Self Care | Payer: Medicare Other | Source: Ambulatory Visit | Attending: Cardiology | Admitting: Cardiology

## 2017-09-06 ENCOUNTER — Encounter (HOSPITAL_COMMUNITY)
Admission: RE | Disposition: A | Discharge status: Home / Self Care | Payer: Self-pay | Source: Ambulatory Visit | Attending: Cardiology

## 2017-09-06 ENCOUNTER — Other Ambulatory Visit: Payer: Self-pay

## 2017-09-06 ENCOUNTER — Encounter (HOSPITAL_COMMUNITY): Payer: Self-pay | Admitting: Cardiology

## 2017-09-06 DIAGNOSIS — R0609 Other forms of dyspnea: Secondary | ICD-10-CM | POA: Diagnosis present

## 2017-09-06 DIAGNOSIS — I252 Old myocardial infarction: Secondary | ICD-10-CM | POA: Diagnosis not present

## 2017-09-06 DIAGNOSIS — I2582 Chronic total occlusion of coronary artery: Secondary | ICD-10-CM | POA: Diagnosis not present

## 2017-09-06 DIAGNOSIS — K219 Gastro-esophageal reflux disease without esophagitis: Secondary | ICD-10-CM | POA: Insufficient documentation

## 2017-09-06 DIAGNOSIS — I2510 Atherosclerotic heart disease of native coronary artery without angina pectoris: Secondary | ICD-10-CM | POA: Diagnosis not present

## 2017-09-06 DIAGNOSIS — I209 Angina pectoris, unspecified: Secondary | ICD-10-CM | POA: Diagnosis present

## 2017-09-06 DIAGNOSIS — Z7982 Long term (current) use of aspirin: Secondary | ICD-10-CM | POA: Diagnosis not present

## 2017-09-06 DIAGNOSIS — I25709 Atherosclerosis of coronary artery bypass graft(s), unspecified, with unspecified angina pectoris: Secondary | ICD-10-CM | POA: Diagnosis present

## 2017-09-06 DIAGNOSIS — Z87891 Personal history of nicotine dependence: Secondary | ICD-10-CM | POA: Insufficient documentation

## 2017-09-06 DIAGNOSIS — G4733 Obstructive sleep apnea (adult) (pediatric): Secondary | ICD-10-CM | POA: Diagnosis not present

## 2017-09-06 DIAGNOSIS — I110 Hypertensive heart disease with heart failure: Secondary | ICD-10-CM | POA: Insufficient documentation

## 2017-09-06 DIAGNOSIS — Z993 Dependence on wheelchair: Secondary | ICD-10-CM | POA: Diagnosis not present

## 2017-09-06 DIAGNOSIS — Z885 Allergy status to narcotic agent status: Secondary | ICD-10-CM | POA: Diagnosis not present

## 2017-09-06 DIAGNOSIS — Z7902 Long term (current) use of antithrombotics/antiplatelets: Secondary | ICD-10-CM | POA: Insufficient documentation

## 2017-09-06 DIAGNOSIS — I25810 Atherosclerosis of coronary artery bypass graft(s) without angina pectoris: Secondary | ICD-10-CM | POA: Diagnosis present

## 2017-09-06 DIAGNOSIS — I779 Disorder of arteries and arterioles, unspecified: Secondary | ICD-10-CM | POA: Diagnosis present

## 2017-09-06 DIAGNOSIS — Z951 Presence of aortocoronary bypass graft: Secondary | ICD-10-CM | POA: Diagnosis not present

## 2017-09-06 DIAGNOSIS — Z955 Presence of coronary angioplasty implant and graft: Secondary | ICD-10-CM | POA: Insufficient documentation

## 2017-09-06 DIAGNOSIS — Z6837 Body mass index (BMI) 37.0-37.9, adult: Secondary | ICD-10-CM | POA: Insufficient documentation

## 2017-09-06 DIAGNOSIS — I272 Pulmonary hypertension, unspecified: Secondary | ICD-10-CM | POA: Diagnosis not present

## 2017-09-06 DIAGNOSIS — J449 Chronic obstructive pulmonary disease, unspecified: Secondary | ICD-10-CM | POA: Insufficient documentation

## 2017-09-06 DIAGNOSIS — M1990 Unspecified osteoarthritis, unspecified site: Secondary | ICD-10-CM | POA: Insufficient documentation

## 2017-09-06 DIAGNOSIS — I5033 Acute on chronic diastolic (congestive) heart failure: Secondary | ICD-10-CM | POA: Insufficient documentation

## 2017-09-06 DIAGNOSIS — E669 Obesity, unspecified: Secondary | ICD-10-CM | POA: Diagnosis not present

## 2017-09-06 DIAGNOSIS — I872 Venous insufficiency (chronic) (peripheral): Secondary | ICD-10-CM | POA: Diagnosis not present

## 2017-09-06 DIAGNOSIS — E785 Hyperlipidemia, unspecified: Secondary | ICD-10-CM | POA: Diagnosis not present

## 2017-09-06 DIAGNOSIS — E1151 Type 2 diabetes mellitus with diabetic peripheral angiopathy without gangrene: Secondary | ICD-10-CM | POA: Insufficient documentation

## 2017-09-06 DIAGNOSIS — Z7951 Long term (current) use of inhaled steroids: Secondary | ICD-10-CM | POA: Insufficient documentation

## 2017-09-06 HISTORY — PX: RIGHT/LEFT HEART CATH AND CORONARY/GRAFT ANGIOGRAPHY: CATH118267

## 2017-09-06 LAB — POCT I-STAT 3, ART BLOOD GAS (G3+)
ACID-BASE DEFICIT: 1 mmol/L (ref 0.0–2.0)
Bicarbonate: 24.7 mmol/L (ref 20.0–28.0)
O2 SAT: 98 %
TCO2: 26 mmol/L (ref 22–32)
pCO2 arterial: 42.9 mmHg (ref 32.0–48.0)
pH, Arterial: 7.367 (ref 7.350–7.450)
pO2, Arterial: 115 mmHg — ABNORMAL HIGH (ref 83.0–108.0)

## 2017-09-06 LAB — POCT I-STAT 3, VENOUS BLOOD GAS (G3P V)
BICARBONATE: 26.7 mmol/L (ref 20.0–28.0)
O2 Saturation: 76 %
PO2 VEN: 44 mmHg (ref 32.0–45.0)
TCO2: 28 mmol/L (ref 22–32)
pCO2, Ven: 50 mmHg (ref 44.0–60.0)
pH, Ven: 7.335 (ref 7.250–7.430)

## 2017-09-06 LAB — GLUCOSE, CAPILLARY
GLUCOSE-CAPILLARY: 124 mg/dL — AB (ref 70–99)
Glucose-Capillary: 145 mg/dL — ABNORMAL HIGH (ref 70–99)

## 2017-09-06 SURGERY — RIGHT/LEFT HEART CATH AND CORONARY/GRAFT ANGIOGRAPHY
Anesthesia: LOCAL

## 2017-09-06 MED ORDER — VERAPAMIL HCL 2.5 MG/ML IV SOLN
INTRAVENOUS | Status: AC
  Filled 2017-09-06: qty 2

## 2017-09-06 MED ORDER — HEPARIN (PORCINE) IN NACL 1000-0.9 UT/500ML-% IV SOLN
INTRAVENOUS | Status: DC | PRN
  Administered 2017-09-06 (×2): 500 mL

## 2017-09-06 MED ORDER — FENTANYL CITRATE (PF) 100 MCG/2ML IJ SOLN
INTRAMUSCULAR | Status: DC | PRN
  Administered 2017-09-06 (×2): 25 ug via INTRAVENOUS

## 2017-09-06 MED ORDER — IOPAMIDOL (ISOVUE-370) INJECTION 76%
INTRAVENOUS | Status: DC | PRN
  Administered 2017-09-06: 100 mL via INTRA_ARTERIAL

## 2017-09-06 MED ORDER — SODIUM CHLORIDE 0.9% FLUSH: 3.0000 mL | INTRAVENOUS | Status: DC | PRN

## 2017-09-06 MED ORDER — LIDOCAINE HCL (PF) 1 % IJ SOLN
INTRAMUSCULAR | Status: AC
  Filled 2017-09-06: qty 30

## 2017-09-06 MED ORDER — SODIUM CHLORIDE 0.9 % IV SOLN: 250.0000 mL | INTRAVENOUS | Status: DC | PRN

## 2017-09-06 MED ORDER — ASPIRIN 81 MG PO CHEW
81.0000 mg | CHEWABLE_TABLET | ORAL | Status: AC
  Administered 2017-09-06: 81 mg via ORAL
  Filled 2017-09-06: qty 1

## 2017-09-06 MED ORDER — SODIUM CHLORIDE 0.9% FLUSH: 3.0000 mL | Freq: Two times a day (BID) | INTRAVENOUS | Status: DC

## 2017-09-06 MED ORDER — ACETAMINOPHEN 325 MG PO TABS: 650.0000 mg | ORAL_TABLET | ORAL | Status: DC | PRN

## 2017-09-06 MED ORDER — MIDAZOLAM HCL 2 MG/2ML IJ SOLN
INTRAMUSCULAR | Status: DC | PRN
  Administered 2017-09-06 (×2): 1 mg via INTRAVENOUS

## 2017-09-06 MED ORDER — SODIUM CHLORIDE 0.9 % WEIGHT BASED INFUSION
3.0000 mL/kg/h | INTRAVENOUS | Status: AC
  Administered 2017-09-06: 3 mL/kg/h via INTRAVENOUS

## 2017-09-06 MED ORDER — HEPARIN SODIUM (PORCINE) 1000 UNIT/ML IJ SOLN
INTRAMUSCULAR | Status: AC
  Filled 2017-09-06: qty 1

## 2017-09-06 MED ORDER — IOPAMIDOL (ISOVUE-370) INJECTION 76%
INTRAVENOUS | Status: AC
  Filled 2017-09-06: qty 125

## 2017-09-06 MED ORDER — LIDOCAINE HCL (PF) 1 % IJ SOLN
INTRAMUSCULAR | Status: DC | PRN
  Administered 2017-09-06 (×2): 2 mL

## 2017-09-06 MED ORDER — HEPARIN (PORCINE) IN NACL 1000-0.9 UT/500ML-% IV SOLN
INTRAVENOUS | Status: AC
  Filled 2017-09-06: qty 500

## 2017-09-06 MED ORDER — ONDANSETRON HCL 4 MG/2ML IJ SOLN: 4.0000 mg | Freq: Four times a day (QID) | INTRAMUSCULAR | Status: DC | PRN

## 2017-09-06 MED ORDER — MIDAZOLAM HCL 2 MG/2ML IJ SOLN
INTRAMUSCULAR | Status: AC
  Filled 2017-09-06: qty 2

## 2017-09-06 MED ORDER — SODIUM CHLORIDE 0.9 % WEIGHT BASED INFUSION: 1.0000 mL/kg/h | INTRAVENOUS | Status: AC

## 2017-09-06 MED ORDER — VERAPAMIL HCL 2.5 MG/ML IV SOLN
INTRAVENOUS | Status: DC | PRN
  Administered 2017-09-06: 08:00:00 via INTRA_ARTERIAL

## 2017-09-06 MED ORDER — FENTANYL CITRATE (PF) 100 MCG/2ML IJ SOLN
INTRAMUSCULAR | Status: AC
  Filled 2017-09-06: qty 2

## 2017-09-06 MED ORDER — SODIUM CHLORIDE 0.9 % WEIGHT BASED INFUSION: 1.0000 mL/kg/h | INTRAVENOUS | Status: DC

## 2017-09-06 MED ORDER — HEPARIN SODIUM (PORCINE) 1000 UNIT/ML IJ SOLN
INTRAMUSCULAR | Status: DC | PRN
  Administered 2017-09-06: 5000 [IU] via INTRAVENOUS

## 2017-09-06 SURGICAL SUPPLY — 14 items
CATH BALLN WEDGE 5F 110CM (CATHETERS) ×1 IMPLANT
CATH INFINITI 5 FR IM (CATHETERS) ×1 IMPLANT
CATH INFINITI 5FR AL1 (CATHETERS) ×1 IMPLANT
CATH INFINITI 5FR MULTPACK ANG (CATHETERS) ×1 IMPLANT
DEVICE RAD COMP TR BAND LRG (VASCULAR PRODUCTS) ×1 IMPLANT
GLIDESHEATH SLEND SS 6F .021 (SHEATH) ×1 IMPLANT
GUIDEWIRE INQWIRE 1.5J.035X260 (WIRE) IMPLANT
INQWIRE 1.5J .035X260CM (WIRE) ×2
KIT HEART LEFT (KITS) ×2 IMPLANT
PACK CARDIAC CATHETERIZATION (CUSTOM PROCEDURE TRAY) ×2 IMPLANT
SHEATH GLIDE SLENDER 4/5FR (SHEATH) ×1 IMPLANT
SHEATH PROBE COVER 6X72 (BAG) ×1 IMPLANT
TRANSDUCER W/STOPCOCK (MISCELLANEOUS) ×2 IMPLANT
TUBING CIL FLEX 10 FLL-RA (TUBING) ×2 IMPLANT

## 2017-09-06 NOTE — Discharge Instructions (Signed)

## 2017-09-06 NOTE — Interval H&P Note (Signed)
History and Physical Interval Note:  09/06/2017 7:11 AM  [name]  has presented today for surgery, with the diagnosis of hf - cad  The various methods of treatment have been discussed with the patient and family. After consideration of risks, benefits and other options for treatment, the patient has consented to  Procedure(s): RIGHT/LEFT HEART CATH AND CORONARY/GRAFT ANGIOGRAPHY (N/A) as a surgical intervention .  The patient's history has been reviewed, patient examined, no change in status, stable for surgery.  I have reviewed the patient's chart and labs.  Questions were answered to the patient's satisfaction.     [facility] 09/06/2017 7:11 AM Cath Lab Visit (complete for each Cath Lab visit)  Clinical Evaluation Leading to the Procedure:   ACS: No.  Non-ACS:    Anginal Classification: CCS III  Anti-ischemic medical therapy: Minimal Therapy (1 class of medications)  Non-Invasive Test Results: No non-invasive testing performed  Prior CABG: Previous CABG

## 2017-09-07 NOTE — Progress Notes (Signed)
He presents today complaining of painful elongated toenails.  Has a history of type 2 diabetes with peripheral circulatory disorder.  Objective: Vital signs are stable he is alert and oriented x3 pulses are barely palpable bilateral capillary fill time is immediate.  Toenails are long thick yellow dystrophic-like mycotic no open lesions or wounds are noted.  Assessment: Pain in limb secondary to onychomycosis and peripheral vascular disease.  Plan: Debridement of all toenails 1 through 5 bilateral.

## 2017-09-25 ENCOUNTER — Encounter: Payer: Self-pay | Admitting: Medical

## 2017-09-25 ENCOUNTER — Ambulatory Visit: Payer: Medicare Other | Admitting: Medical

## 2017-09-25 VITALS — BP 142/60 | HR 72 | Ht 66.0 in | Wt 226.0 lb

## 2017-09-25 DIAGNOSIS — I25810 Atherosclerosis of coronary artery bypass graft(s) without angina pectoris: Secondary | ICD-10-CM | POA: Diagnosis not present

## 2017-09-25 DIAGNOSIS — I739 Peripheral vascular disease, unspecified: Secondary | ICD-10-CM

## 2017-09-25 DIAGNOSIS — I10 Essential (primary) hypertension: Secondary | ICD-10-CM

## 2017-09-25 DIAGNOSIS — I5032 Chronic diastolic (congestive) heart failure: Secondary | ICD-10-CM | POA: Diagnosis not present

## 2017-09-25 DIAGNOSIS — R0602 Shortness of breath: Secondary | ICD-10-CM

## 2017-09-25 DIAGNOSIS — E785 Hyperlipidemia, unspecified: Secondary | ICD-10-CM

## 2017-09-25 LAB — BASIC METABOLIC PANEL
BUN / CREAT RATIO: 16 (ref 10–24)
BUN: 22 mg/dL (ref 8–27)
CO2: 22 mmol/L (ref 20–29)
CREATININE: 1.36 mg/dL — AB (ref 0.76–1.27)
Calcium: 8.9 mg/dL (ref 8.6–10.2)
Chloride: 96 mmol/L (ref 96–106)
GFR, EST AFRICAN AMERICAN: 62 mL/min/{1.73_m2} (ref >59)
GFR, EST NON AFRICAN AMERICAN: 53 mL/min/{1.73_m2} — AB (ref >59)
Glucose: 136 mg/dL — ABNORMAL HIGH (ref 65–99)
Potassium: 4 mmol/L (ref 3.5–5.2)
SODIUM: 137 mmol/L (ref 134–144)

## 2017-09-25 MED ORDER — FUROSEMIDE 40 MG PO TABS: 40.0000 mg | ORAL_TABLET | Freq: Every day | ORAL | 3 refills | Status: DC

## 2017-09-25 NOTE — Patient Instructions (Signed)
We will be checking the following labs today - BMET   Medication Instructions:    Decrease your lasix to 40mg  daily given improvement in swelling. You can take an additional 40mg  of lasix if you develop weight gain or worsening swelling.   Continue with the remainder of your current medicines.     Testing/Procedures To Be Arranged:  N/A  Follow-Up:   See Dr. [name] in 6 months    Other Special Instructions:   Continue to monitor your weight daily. If you notice that you've gained 2-3lbs in 1 day or 5lbs in 1 week you can take an additional 40mg  of lasix for 3 days.   Continue to limit your salt intake to <2g per day.   Please follow-up with pulmonary outpatient to further evaluate your shortness of breath    If you need a refill on your cardiac medications before your next appointment, please call your pharmacy.   Call the [place] office at [phone] if you have any questions, problems or concerns.
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
## 2017-09-26 ENCOUNTER — Other Ambulatory Visit: Payer: Self-pay | Admitting: Medical

## 2017-09-26 DIAGNOSIS — I259 Chronic ischemic heart disease, unspecified: Secondary | ICD-10-CM

## 2017-10-02 ENCOUNTER — Other Ambulatory Visit: Payer: Self-pay | Admitting: Cardiology

## 2017-10-03 ENCOUNTER — Telehealth: Payer: Self-pay

## 2017-10-03 ENCOUNTER — Encounter (HOSPITAL_COMMUNITY): Payer: Medicare Other

## 2017-10-03 ENCOUNTER — Other Ambulatory Visit: Payer: Medicare Other | Admitting: *Deleted

## 2017-10-03 ENCOUNTER — Ambulatory Visit: Payer: Medicare Other | Admitting: Family

## 2017-10-03 DIAGNOSIS — I259 Chronic ischemic heart disease, unspecified: Secondary | ICD-10-CM

## 2017-10-03 LAB — BASIC METABOLIC PANEL
BUN/Creatinine Ratio: 23 (ref 10–24)
BUN: 30 mg/dL — AB (ref 8–27)
CALCIUM: 9.2 mg/dL (ref 8.6–10.2)
CHLORIDE: 98 mmol/L (ref 96–106)
CO2: 21 mmol/L (ref 20–29)
CREATININE: 1.31 mg/dL — AB (ref 0.76–1.27)
GFR calc Af Amer: 65 mL/min/{1.73_m2} (ref >59)
GFR calc non Af Amer: 56 mL/min/{1.73_m2} — ABNORMAL LOW (ref >59)
GLUCOSE: 154 mg/dL — AB (ref 65–99)
Potassium: 4.3 mmol/L (ref 3.5–5.2)
Sodium: 138 mmol/L (ref 134–144)

## 2017-10-03 NOTE — Telephone Encounter (Signed)
Notes recorded by Frederik Schmidt, RN on 10/03/2017 at 4:02 PM EDT Informed patient of results/recommendations. He verbalized understanding.

## 2017-10-03 NOTE — Telephone Encounter (Signed)
-----   Message from Abigail Butts, PA-C sent at 10/03/2017  3:54 PM EDT ----- Please notify the patient that his labs look stable from last week. He can continue his current medications. Thanks!

## 2017-10-18 DIAGNOSIS — I272 Pulmonary hypertension, unspecified: Secondary | ICD-10-CM | POA: Insufficient documentation

## 2017-10-24 ENCOUNTER — Ambulatory Visit: Payer: Medicare Other | Admitting: Family

## 2017-10-24 ENCOUNTER — Ambulatory Visit (HOSPITAL_COMMUNITY)
Admission: RE | Admit: 2017-10-24 | Discharge: 2017-10-24 | Disposition: A | Discharge status: Home / Self Care | Payer: Medicare Other | Source: Ambulatory Visit | Attending: Family | Admitting: Family

## 2017-10-24 ENCOUNTER — Encounter: Payer: Self-pay | Admitting: Family

## 2017-10-24 VITALS — BP 147/63 | HR 53 | Temp 97.7°F | Resp 16 | Ht 66.0 in | Wt 220.0 lb

## 2017-10-24 DIAGNOSIS — I779 Disorder of arteries and arterioles, unspecified: Secondary | ICD-10-CM | POA: Diagnosis not present

## 2017-10-24 DIAGNOSIS — I872 Venous insufficiency (chronic) (peripheral): Secondary | ICD-10-CM | POA: Diagnosis not present

## 2017-10-24 DIAGNOSIS — Z87891 Personal history of nicotine dependence: Secondary | ICD-10-CM | POA: Diagnosis not present

## 2017-10-24 NOTE — Patient Instructions (Addendum)
To decrease swelling in your feet and legs: Elevate feet above slightly bent knees, feet above heart, overnight and 3-4 times per day for 20 minutes.    Chronic Venous Insufficiency Chronic venous insufficiency, also called venous stasis, is a condition that prevents blood from being pumped effectively through the veins in your legs. Blood may no longer be pumped effectively from the legs back to the heart. This condition can range from mild to severe. With proper treatment, you should be able to continue with an active life. What are the causes? Chronic venous insufficiency occurs when the vein walls become stretched, weakened, or damaged, or when valves within the vein are damaged. Some common causes of this include:  High blood pressure inside the veins (venous hypertension).  Increased blood pressure in the leg veins from long periods of sitting or standing.  A blood clot that blocks blood flow in a vein (deep vein thrombosis, DVT).  Inflammation of a vein (phlebitis) that causes a blood clot to form.  Tumors in the pelvis that cause blood to back up.  What increases the risk? The following factors may make you more likely to develop this condition:  Having a family history of this condition.  Obesity.  Pregnancy.  Living without enough physical activity or exercise (sedentary lifestyle).  Smoking.  Having a job that requires long periods of standing or sitting in one place.  Being a certain age. Women in their 40s and 50s and men in their 70s are more likely to develop this condition.  What are the signs or symptoms? Symptoms of this condition include:  Veins that are enlarged, bulging, or twisted (varicose veins).  Skin breakdown or ulcers.  Reddened or discolored skin on the front of the leg.  Brown, smooth, tight, and painful skin just above the ankle, usually on the inside of the leg (lipodermatosclerosis).  Swelling.  How is this diagnosed? This  condition may be diagnosed based on:  Your medical history.  A physical exam.  Tests, such as: ? A procedure that creates an image of a blood vessel and nearby organs and provides information about blood flow through the blood vessel (duplex ultrasound). ? A procedure that tests blood flow (plethysmography). ? A procedure to look at the veins using X-ray and dye (venogram).  How is this treated? The goals of treatment are to help you return to an active life and to minimize pain or disability. Treatment depends on the severity of your condition, and it may include:  Wearing compression stockings. These can help relieve symptoms and help prevent your condition from getting worse. However, they do not cure the condition.  Sclerotherapy. This is a procedure involving an injection of a material that "dissolves" damaged veins.  Surgery. This may involve: ? Removing a diseased vein (vein stripping). ? Cutting off blood flow through the vein (laser ablation surgery). ? Repairing a valve.  Follow these instructions at home:  Wear compression stockings as told by your health care provider. These stockings help to prevent blood clots and reduce swelling in your legs.  Take over-the-counter and prescription medicines only as told by your health care provider.  Stay active by exercising, walking, or doing different activities. Ask your health care provider what activities are safe for you and how much exercise you need.  Drink enough fluid to keep your urine clear or pale yellow.  Do not use any products that contain nicotine or tobacco, such as cigarettes and e-cigarettes. If you need help   quitting, ask your health care provider.  Keep all follow-up visits as told by your health care provider. This is important. Contact a health care provider if:  You have redness, swelling, or more pain in the affected area.  You see a red streak or line that extends up or down from the affected  area.  You have skin breakdown or a loss of skin in the affected area, even if the breakdown is small.  You get an injury in the affected area. Get help right away if:  You get an injury and an open wound in the affected area.  You have severe pain that does not get better with medicine.  You have sudden numbness or weakness in the foot or ankle below the affected area, or you have trouble moving your foot or ankle.  You have a fever and you have worse or persistent symptoms.  You have chest pain.  You have shortness of breath. Summary  Chronic venous insufficiency, also called venous stasis, is a condition that prevents blood from being pumped effectively through the veins in your legs.  Chronic venous insufficiency occurs when the vein walls become stretched, weakened, or damaged, or when valves within the vein are damaged.  Treatment for this condition depends on how severe your condition is, and it may involve wearing compression stockings or having a procedure.  Make sure you stay active by exercising, walking, or doing different activities. Ask your health care provider what activities are safe for you and how much exercise you need. This information is not intended to replace advice given to you by your health care provider. Make sure you discuss any questions you have with your health care provider. Document Released: 04/23/2006 Document Revised: 11/07/2015 Document Reviewed: 11/07/2015 Elsevier Interactive Patient Education  2017 [employer]   Peripheral Vascular Disease Peripheral vascular disease (PVD) is a disease of the blood vessels that are not part of your heart and brain. A simple term for PVD is poor circulation. In most cases, PVD narrows the blood vessels that carry blood from your heart to the rest of your body. This can result in a decreased supply of blood to your arms, legs, and internal organs, like your stomach or kidneys. However, it most often affects a  person's lower legs and feet. There are two types of PVD.  Organic PVD. This is the more common type. It is caused by damage to the structure of blood vessels.  Functional PVD. This is caused by conditions that make blood vessels contract and tighten (spasm).  Without treatment, PVD tends to get worse over time. PVD can also lead to acute ischemic limb. This is when an arm or limb suddenly has trouble getting enough blood. This is a medical emergency. Follow these instructions at home:  Take medicines only as told by your doctor.  Do not use any tobacco products, including cigarettes, chewing tobacco, or electronic cigarettes. If you need help quitting, ask your doctor.  Lose weight if you are overweight, and maintain a healthy weight as told by your doctor.  Eat a diet that is low in fat and cholesterol. If you need help, ask your doctor.  Exercise regularly. Ask your doctor for some good activities for you.  Take good care of your feet. ? Wear comfortable shoes that fit well. ? Check your feet often for any cuts or sores. Contact a doctor if:  You have cramps in your legs while walking.  You have leg pain when   you are at rest.  You have coldness in a leg or foot.  Your skin changes.  You are unable to get or have an erection (erectile dysfunction).  You have cuts or sores on your feet that are not healing. Get help right away if:  Your arm or leg turns cold and blue.  Your arms or legs become red, warm, swollen, painful, or numb.  You have chest pain or trouble breathing.  You suddenly have weakness in your face, arm, or leg.  You become very confused or you cannot speak.  You suddenly have a very bad headache.  You suddenly cannot see. This information is not intended to replace advice given to you by your health care provider. Make sure you discuss any questions you have with your health care provider. Document Released: 03/14/2009 Document Revised: 05/26/2015  Document Reviewed: 05/28/2013 Elsevier Interactive Patient Education  2017 [employer].  
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
## 2017-11-04 ENCOUNTER — Encounter: Payer: Self-pay | Admitting: Pulmonary Disease

## 2017-11-04 ENCOUNTER — Ambulatory Visit: Payer: Medicare Other | Admitting: Pulmonary Disease

## 2017-11-04 VITALS — BP 130/64 | HR 67 | Ht 64.57 in | Wt 230.0 lb

## 2017-11-04 DIAGNOSIS — K219 Gastro-esophageal reflux disease without esophagitis: Secondary | ICD-10-CM | POA: Diagnosis not present

## 2017-11-04 DIAGNOSIS — R0609 Other forms of dyspnea: Secondary | ICD-10-CM | POA: Diagnosis not present

## 2017-11-04 DIAGNOSIS — J441 Chronic obstructive pulmonary disease with (acute) exacerbation: Secondary | ICD-10-CM | POA: Diagnosis not present

## 2017-11-04 DIAGNOSIS — J3089 Other allergic rhinitis: Secondary | ICD-10-CM

## 2017-11-04 MED ORDER — ALBUTEROL SULFATE (2.5 MG/3ML) 0.083% IN NEBU: 2.5000 mg | INHALATION_SOLUTION | Freq: Four times a day (QID) | RESPIRATORY_TRACT | 11 refills | Status: DC | PRN

## 2017-11-04 MED ORDER — PREDNISONE 10 MG PO TABS: ORAL_TABLET | ORAL | 0 refills | Status: DC

## 2017-11-04 MED ORDER — FLUTICASONE-UMECLIDIN-VILANT 100-62.5-25 MCG/INH IN AEPB: 1.0000 | INHALATION_SPRAY | Freq: Every day | RESPIRATORY_TRACT | 0 refills | Status: DC

## 2017-11-04 MED ORDER — BENZONATATE 200 MG PO CAPS: 200.0000 mg | ORAL_CAPSULE | Freq: Three times a day (TID) | ORAL | 1 refills | Status: DC | PRN

## 2017-11-04 MED ORDER — MONTELUKAST SODIUM 10 MG PO TABS: 10.0000 mg | ORAL_TABLET | Freq: Every day | ORAL | 2 refills | Status: DC

## 2017-11-04 MED ORDER — DOXYCYCLINE HYCLATE 100 MG PO TABS: 100.0000 mg | ORAL_TABLET | Freq: Two times a day (BID) | ORAL | 0 refills | Status: AC

## 2017-11-04 NOTE — Progress Notes (Signed)
Synopsis: Referred in November 2019 for cough.  He has a history of CAD, spinal cord injury.  He had bypass surgery.  Subjective:   PATIENT ID: Hunter [name] GENDER: male DOB: 1950-10-21, MRN: [id number]   HPI  Chief Complaint  Patient presents with  . Consult    former [facility] patient, non-productive chronic cough, increased fatigue and shortness of breath, recently finished prednisone     Hunter [name] is here to see me for dyspnea.  He says that he has been dealing with chest congestion a lot in the last few months.  He says that just standing form his wheelchair is difficult.  He had a history of major spinal cord injury and tries to remain as active a spossible as he can. He tries to exercise with physical exertion in the yard cleaning up sticks etc.  He says that he can't walk due ot his spinal cor injury.  He was previously seen by my partner for possible COPD but he was told he didn't have this.  He was recently put on lasix due to "fluid in his lung" and he has been taking 20mg  daily of that with potassium.  He says that this helped a little but he still has a lot of congestion I nhis chest.  The cough is worse when he lies flat.  He feels like there is mucus in his throat and mid chest.  He says that the mucus is yellow form time to time.  He has been taking Anoro and albuterol which help some.  He used to smoke 1 ppd for 40 years, quit in 2014.    He had six sisters, they are all dead. They all had various forms of cancer.  Some had lung cancer.  His mohter had colon cancer.   GERD he says that this is poorly controlled unless he takes his antacids which control it well.  Allergic rhinitis he says that he has near continuous postnasal drip despite taking cetirizine and Flonase daily.  He is  Past Medical History:  Diagnosis Date  . Bilateral lower extremity edema   . CAD (coronary artery disease) cardiologist-  dr [name]   a. Remote MI with stent in 2005;  b. CABG x 5  in 2005;  c. 09/2010 Cath: LM nl, LAD 70p, 90-72m, D1 80p, LCX 88m, OM1 100, OM2 90ost, OM3 80, RCA 1m, VG->PDA->RPL nl, VG->Diag nl, VG->OM1 nl, LIMA->LAD nl, EF 60%-->Med Rx.  . Chronic venous insufficiency    post vein harvest for CABG  . COPD (chronic obstructive pulmonary disease) ([place])   . Diabetes mellitus type 2, controlled ([place])   . Difficult intravenous access 06-16-2015   multiple attempt IV starts until use of scanner  . ED (erectile dysfunction) of organic origin   . Foot drop, bilateral   . GERD (gastroesophageal reflux disease)   . H/O spinal cord injury    a. more or less wheelchair bound. post mulitple back surgery's including resection spinal tumor  . History of acute myocardial infarction of inferior wall    02/ 2005 inferoposterior  w/ stenting to RCA  . 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 History of cellulitis    left lower leg-- now resolved  . History of ulcer of lower limb    venous statis  ---  resolved  . Hypertension   . Increased liver enzymes    due to  alcohol use  . Left rotator cuff tear   . Myocardial infarction [facility]) 2005   with stent placement  . Nocturia   . OA (osteoarthritis)    left shoulder  . OAB (overactive bladder)   . OSA on CPAP   . PAD (peripheral artery disease) ([place])    followed by Dr. [name]  . Peripheral arterial occlusive disease ([place])    followed by dr fields  . 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 Type 2 diabetes mellitus ([place])   . Urinary incontinence, urge   . Wheelchair bound      Family History  Problem Relation Age of Onset  . Other Father        WORK ACCIDENT  . Heart disease Father        Heart Disease before age 67  . Heart attack Mother   . Heart disease Mother        before age 75  . Diabetes Mother   . Colon cancer Mother   . Heart disease Brother        before age 106  . Diabetes Brother   . Heart attack Brother   . Suicidality Brother     . Coronary artery disease Sister        CABG  . Heart disease Sister   . Diabetes Sister   . Heart attack Sister   . Lung cancer Sister   . COPD Sister   . Coronary artery disease Brother      Social History   Socioeconomic History  . Marital status: Divorced    Spouse name: Not on file  . Number of children: 2  . Years of education: Not on file  . Highest education level: Not on file  Occupational History  . Occupation: disabled  Social Needs  . Financial resource strain: Not on file  . Food insecurity:    Worry: Not on file    Inability: Not on file  . Transportation needs:    Medical: Not on file    Non-medical: Not on file  Tobacco Use  . Smoking status: Former Smoker    Packs/day: 0.50    Years: 40.00    Pack years: 20.00    Types: Cigarettes    Last attempt to quit: 01/01/2009    Years since quitting: 8.8  . Smokeless tobacco: Never Used  . Tobacco comment: as of 06-09-2015 per pt last month lite smoker  Substance and Sexual Activity  . Alcohol use: No    Alcohol/week: 0.0 standard drinks  . Drug use: No  . Sexual activity: Not on file  Lifestyle  . Physical activity:    Days per week: Not on file    Minutes per session: Not on file  . Stress: Not on file  Relationships  . Social connections:    Talks on phone: Not on file    Gets together: Not on file    Attends religious service: Not on file    Active member of club or organization: Not on file    Attends meetings of clubs or organizations: Not on file    Relationship status: Not on file  . Intimate partner violence:    Fear of current or ex partner: Not on file    Emotionally abused: Not on file    Physically abused: Not on file    Forced sexual activity: Not on file  Other Topics Concern  . Not on file  Social History Narrative  .  Not on file     Allergies  Allergen Reactions  . Codeine Nausea And Vomiting and Other (See Comments)    Severe stomach cramps  . Lisinopril Cough  .  Ramipril Cough  . Tape Other (See Comments) and Tinitus    Only plastic tape--can use paper tape OK. Blisters     Outpatient Medications Prior to Visit  Medication Sig Dispense Refill  . aspirin 81 MG tablet Take 81 mg by mouth at bedtime.     . cetirizine (ZYRTEC) 10 MG tablet Take 10 mg by mouth daily.    . clopidogrel (PLAVIX) 75 MG tablet Take 75 mg by mouth daily.  12  . Docusate Calcium (STOOL SOFTENER PO) Take 100 mg by mouth every evening.     [name] doxazosin (CARDURA) 2 MG tablet TAKE 1 TABLET (2 MG TOTAL) BY MOUTH AT BEDTIME. 30 tablet 12  . fluticasone (FLONASE) 50 MCG/ACT nasal spray Place 1 spray into both nostrils at bedtime as needed for allergies.     . furosemide (LASIX) 40 MG tablet Take 1 tablet (40 mg total) by mouth daily. 90 tablet 3  . gabapentin (NEURONTIN) 400 MG capsule Take 400 mg by mouth 2 (two) times daily.     [name] HUMALOG KWIKPEN 100 UNIT/ML SOPN Inject 24 Units into the skin 3 (three) times daily.     [name] ibuprofen (ADVIL,MOTRIN) 200 MG tablet Take 400 mg by mouth every 6 (six) hours as needed for headache or moderate pain.    [name] ketotifen (ZADITOR) 0.025 % ophthalmic solution Place 2 drops as needed into both eyes (for allergies).     . metoprolol tartrate (LOPRESSOR) 50 MG tablet Take 1 tablet (50 mg total) by mouth 2 (two) times daily. 180 tablet 3  . nitroGLYCERIN (NITROSTAT) 0.4 MG SL tablet Place 1 tablet (0.4 mg total) under the tongue every 5 (five) minutes as needed for chest pain (chest pain). 25 tablet prn  . omeprazole (PRILOSEC) 40 MG capsule Take 40 mg by mouth 2 (two) times daily.    [name] oxybutynin (DITROPAN XL) 15 MG 24 hr tablet Take 15 mg by mouth at bedtime.     . potassium chloride SA (K-DUR,KLOR-CON) 20 MEQ tablet Take 1 tablet (20 mEq total) by mouth daily. 90 tablet 3  . pravastatin (PRAVACHOL) 40 MG tablet Take 40 mg by mouth daily.  2  . PROAIR HFA 108 (90 Base) MCG/ACT inhaler INHALE 2-4 PUFFS UP TO 4 TIMES DAILY AS NEEDED FOR WHEEZING  3  .  tamsulosin (FLOMAX) 0.4 MG CAPS capsule Take 0.4 mg by mouth daily.    . traMADol (ULTRAM) 50 MG tablet Take 1 tablet (50 mg total) 3 (three) times daily as needed by mouth for moderate pain. 30 tablet 4  . TRESIBA FLEXTOUCH 200 UNIT/ML SOPN Inject 46 Units into the skin daily.  6  . triamterene-hydrochlorothiazide (MAXZIDE-25) 37.5-25 MG per tablet Take 1 tablet by mouth daily.     [name] umeclidinium-vilanterol (ANORO ELLIPTA) 62.5-25 MCG/INH AEPB Inhale 1 puff into the lungs daily.    . Vitamin D, Ergocalciferol, (DRISDOL) 50000 units CAPS capsule Take 50,000 Units by mouth every Wednesday.   3  . predniSONE (DELTASONE) 20 MG tablet Take 20 mg by mouth daily.  0  . doxycycline (VIBRA-TABS) 100 MG tablet TAKE 1 TABLET BY MOUTH TWICE A DAY FOR 7 DAYS  0   No facility-administered medications prior to visit.     Review of Systems  Constitutional: Negative for fever and  weight loss.  HENT: Positive for congestion. Negative for ear pain, nosebleeds and sore throat.   Eyes: Negative for redness.  Respiratory: Positive for cough, shortness of breath and wheezing.   Cardiovascular: Positive for leg swelling. Negative for palpitations and PND.  Gastrointestinal: Negative for nausea and vomiting.  Genitourinary: Positive for dysuria and frequency.  Skin: Negative for rash.  Neurological: Negative for headaches.  Endo/Heme/Allergies: Bruises/bleeds easily.  Psychiatric/Behavioral: Negative for depression. The patient is not nervous/anxious.       Objective:  Physical Exam   Vitals:   11/04/17 1108  BP: 130/64  Pulse: 67  SpO2: 99%  Weight: 230 lb (104.3 kg)  Height: 5' 4.57" (1.64 m)    Gen: obese, chronically ill appearing, in wheelchair, no acute distress HENT: NCAT, OP clear, neck supple without masses Eyes: PERRL, EOMi Lymph: no cervical lymphadenopathy PULM: wheezing bilaterally  CV: RRR, no mgr, no JVD GI: BS+, soft, nontender, no hsm Derm: no rash or skin breakdown MSK:  diminished muscle bulk and tone in legs Neuro: A&Ox4, CN II-XII intact, can move legs but has weakness, Psyche: normal mood and affect   CBC    Component Value Date/Time   WBC 5.9 08/22/2017 1159   WBC 6.9 11/06/2016 0756   RBC 4.27 08/22/2017 1159   RBC 4.18 (L) 11/06/2016 0756   HGB 13.1 08/22/2017 1159   HCT 39.0 08/22/2017 1159   PLT 223 08/22/2017 1159   MCV 91 08/22/2017 1159   MCH 30.7 08/22/2017 1159   MCH 30.9 11/06/2016 0756   MCHC 33.6 08/22/2017 1159   MCHC 32.8 11/06/2016 0756   RDW 13.3 08/22/2017 1159   LYMPHSABS 0.4 (L) 11/06/2016 0756   MONOABS 0.7 11/06/2016 0756   EOSABS 0.0 11/06/2016 0756   BASOSABS 0.0 11/06/2016 0756     Chest imaging:  PFT: 2010 pulmonary function test at our office showed a ratio of 69%, FEV1 1.65 L 57% predicted total lung capacity 4.4 L 80% predicted, DLCO 23 mL 60% predicted November 2019 simple spirometry ratio 69%, FEV1 1.3 L 48% predicted, FVC 1.7 L 48% predicted  Labs:  Path:  Echo: August 2019 echocardiogram showed normal LVEF, normal RV size and function without evidence of pulmonary hypertension  Heart Catheterization: September 2019 left heart catheterization showed three-vessel disease, patent grafts, pulmonary capillary wedge pressure was 22, mean PA pressure was 28      Assessment & Plan:   COPD with acute exacerbation (HCC) - Plan: Spirometry with Graph, Ambulatory Referral for DME  Dyspnea on exertion  Seasonal allergic rhinitis due to fungal spores  Gastroesophageal reflux disease, esophagitis presence not specified  Discussion: This is a pleasant 67 year old male who comes to my clinic today for evaluation of a prolonged COPD exacerbation.  He is still wheezing on physical exam.  Lung function testing in February 2010 showed evidence of reversible airflow obstruction which was at the time labeled as asthma.  I think considering his ongoing smoking history after that test and his ongoing dyspnea that  we can safely say that he has COPD.  We will repeat lung function testing today.  I believe that he needs a more prolonged course of prednisone and antibiotics.  Plan: COPD with active exacerbation: Take prednisone 40 mg daily x3 days, 30 mg daily x3 days, 20 mg daily x3 days Take doxycycline 100 mg twice a day x5 days Take Tessalon as needed for cough Use over-the-counter guaifenesin to help with mucus production Start using albuterol nebulized 2-3 times a  day as needed for chest tightness wheezing or shortness of breath Stop Anoro Take the Trelegy sample we gave you today 1 puff a day We will request records for the chest x-ray performed by [facility]  Allergic rhinitis: Continue cetirizine Continue fluticasone Start taking montelukast  Gastroesophageal reflux disease: Keep taking antacid therapy daily as you are doing  Cough: Use Tessalon as needed for the cough  We will see you back in 1 week to make sure things are going in the right direction, nurse practitioner visit    Current Outpatient Medications:  .  aspirin 81 MG tablet, Take 81 mg by mouth at bedtime. , Disp: , Rfl:  .  cetirizine (ZYRTEC) 10 MG tablet, Take 10 mg by mouth daily., Disp: , Rfl:  .  clopidogrel (PLAVIX) 75 MG tablet, Take 75 mg by mouth daily., Disp: , Rfl: 12 .  Docusate Calcium (STOOL SOFTENER PO), Take 100 mg by mouth every evening. , Disp: , Rfl:  .  doxazosin (CARDURA) 2 MG tablet, TAKE 1 TABLET (2 MG TOTAL) BY MOUTH AT BEDTIME., Disp: 30 tablet, Rfl: 12 .  fluticasone (FLONASE) 50 MCG/ACT nasal spray, Place 1 spray into both nostrils at bedtime as needed for allergies. , Disp: , Rfl:  .  furosemide (LASIX) 40 MG tablet, Take 1 tablet (40 mg total) by mouth daily., Disp: 90 tablet, Rfl: 3 .  gabapentin (NEURONTIN) 400 MG capsule, Take 400 mg by mouth 2 (two) times daily. , Disp: , Rfl:  .  HUMALOG KWIKPEN 100 UNIT/ML SOPN, Inject 24 Units into the skin 3 (three) times daily. , Disp: ,  Rfl:  .  ibuprofen (ADVIL,MOTRIN) 200 MG tablet, Take 400 mg by mouth every 6 (six) hours as needed for headache or moderate pain., Disp: , Rfl:  .  ketotifen (ZADITOR) 0.025 % ophthalmic solution, Place 2 drops as needed into both eyes (for allergies). , Disp: , Rfl:  .  metoprolol tartrate (LOPRESSOR) 50 MG tablet, Take 1 tablet (50 mg total) by mouth 2 (two) times daily., Disp: 180 tablet, Rfl: 3 .  nitroGLYCERIN (NITROSTAT) 0.4 MG SL tablet, Place 1 tablet (0.4 mg total) under the tongue every 5 (five) minutes as needed for chest pain (chest pain)., Disp: 25 tablet, Rfl: prn .  omeprazole (PRILOSEC) 40 MG capsule, Take 40 mg by mouth 2 (two) times daily., Disp: , Rfl:  .  oxybutynin (DITROPAN XL) 15 MG 24 hr tablet, Take 15 mg by mouth at bedtime. , Disp: , Rfl:  .  potassium chloride SA (K-DUR,KLOR-CON) 20 MEQ tablet, Take 1 tablet (20 mEq total) by mouth daily., Disp: 90 tablet, Rfl: 3 .  pravastatin (PRAVACHOL) 40 MG tablet, Take 40 mg by mouth daily., Disp: , Rfl: 2 .  PROAIR HFA 108 (90 Base) MCG/ACT inhaler, INHALE 2-4 PUFFS UP TO 4 TIMES DAILY AS NEEDED FOR WHEEZING, Disp: , Rfl: 3 .  tamsulosin (FLOMAX) 0.4 MG CAPS capsule, Take 0.4 mg by mouth daily., Disp: , Rfl:  .  traMADol (ULTRAM) 50 MG tablet, Take 1 tablet (50 mg total) 3 (three) times daily as needed by mouth for moderate pain., Disp: 30 tablet, Rfl: 4 .  TRESIBA FLEXTOUCH 200 UNIT/ML SOPN, Inject 46 Units into the skin daily., Disp: , Rfl: 6 .  triamterene-hydrochlorothiazide (MAXZIDE-25) 37.5-25 MG per tablet, Take 1 tablet by mouth daily. , Disp: , Rfl:  .  umeclidinium-vilanterol (ANORO ELLIPTA) 62.5-25 MCG/INH AEPB, Inhale 1 puff into the lungs daily., Disp: , Rfl:  .  Vitamin D, Ergocalciferol, (DRISDOL) 50000 units CAPS capsule, Take 50,000 Units by mouth every Wednesday. , Disp: , Rfl: 3 .  albuterol (PROVENTIL) (2.5 MG/3ML) 0.083% nebulizer solution, Take 3 mLs (2.5 mg total) by nebulization every 6 (six) hours as needed  for wheezing or shortness of breath., Disp: 75 mL, Rfl: 11 .  benzonatate (TESSALON) 200 MG capsule, Take 1 capsule (200 mg total) by mouth every 8 (eight) hours as needed for cough., Disp: 45 capsule, Rfl: 1 .  doxycycline (VIBRA-TABS) 100 MG tablet, TAKE 1 TABLET BY MOUTH TWICE A DAY FOR 7 DAYS, Disp: , Rfl: 0 .  doxycycline (VIBRA-TABS) 100 MG tablet, Take 1 tablet (100 mg total) by mouth 2 (two) times daily for 5 days., Disp: 10 tablet, Rfl: 0 .  Fluticasone-Umeclidin-Vilant (TRELEGY ELLIPTA) 100-62.5-25 MCG/INH AEPB, Inhale 1 puff into the lungs daily., Disp: 1 each, Rfl: 0 .  montelukast (SINGULAIR) 10 MG tablet, Take 1 tablet (10 mg total) by mouth at bedtime., Disp: 30 tablet, Rfl: 2 .  predniSONE (DELTASONE) 10 MG tablet, 40 mg X 3 days, 30mg  X 3 days, 20mg  X 3 days, 10mg  X 3 days, then stop, Disp: 30 tablet, Rfl: 0

## 2017-11-04 NOTE — Patient Instructions (Signed)
COPD with active exacerbation: Take prednisone 40 mg daily x3 days, 30 mg daily x3 days, 20 mg daily x3 days Take doxycycline 100 mg twice a day x5 days Take Tessalon as needed for cough Use over-the-counter guaifenesin to help with mucus production Start using albuterol nebulized 2-3 times a day as needed for chest tightness wheezing or shortness of breath Stop Anoro Take the Trelegy sample we gave you today 1 puff a day We will request records for the chest x-ray performed by [facility]  Allergic rhinitis: Continue cetirizine Continue fluticasone Start taking montelukast  Gastroesophageal reflux disease: Keep taking antacid therapy daily as you are doing  Cough: Use Tessalon as needed for the cough  We will see you back in 1 week to make sure things are going in the right direction, nurse practitioner visit

## 2017-11-06 ENCOUNTER — Telehealth: Payer: Self-pay | Admitting: Pulmonary Disease

## 2017-11-06 NOTE — Telephone Encounter (Signed)
Called patient he stated that he did not receive the albuterol solution to go into the nebulizer machine. Advised patient that we have sent the order and if he has not heard anything by Friday to let us know. Patient verbalized understanding. Nothing further needed.

## 2017-11-12 ENCOUNTER — Telehealth: Payer: Self-pay | Admitting: Pulmonary Disease

## 2017-11-12 MED ORDER — ALBUTEROL SULFATE (2.5 MG/3ML) 0.083% IN NEBU: INHALATION_SOLUTION | RESPIRATORY_TRACT | 11 refills | Status: DC

## 2017-11-12 NOTE — Telephone Encounter (Signed)
Patient called back stating [facility] Lisabeth Devoid [phone] him they have the order but is waiting on certification from [facility] before it can be shipped. Once certification is received the nebulizer solution will be shipped overnight; pt contact # [phone]

## 2017-11-12 NOTE — Telephone Encounter (Signed)
Spoke with Hunter [name], and she states the Rx needed to say Take 3 mLs (2.5 mg total) by nebulization every 6 (six) hours and as needed for wheezing or shortness of breath. The Rx cannot just say as needed. I fixed the rx and re-sent the Rx to [facility] again and advised the pt that this was done. Nothing further is needed.

## 2017-11-13 ENCOUNTER — Telehealth: Payer: Self-pay | Admitting: Pulmonary Disease

## 2017-11-13 MED ORDER — ALBUTEROL SULFATE (2.5 MG/3ML) 0.083% IN NEBU: INHALATION_SOLUTION | RESPIRATORY_TRACT | 11 refills | Status: DC

## 2017-11-13 NOTE — Telephone Encounter (Signed)
Called and spoke with Tiffany, she stated that the [facility] and in the quantity prescribed line it needs to say 120 instead of 75. The pharmacy will reject it if not. This needs to be sent back to them in order to provide the patient with the medication. Re sent. Nothing further needed.

## 2017-11-14 ENCOUNTER — Other Ambulatory Visit (INDEPENDENT_AMBULATORY_CARE_PROVIDER_SITE_OTHER): Payer: Medicare Other

## 2017-11-14 ENCOUNTER — Encounter: Payer: Self-pay | Admitting: Primary Care

## 2017-11-14 ENCOUNTER — Ambulatory Visit: Payer: Medicare Other | Admitting: Primary Care

## 2017-11-14 ENCOUNTER — Ambulatory Visit (INDEPENDENT_AMBULATORY_CARE_PROVIDER_SITE_OTHER)
Admission: RE | Admit: 2017-11-14 | Discharge: 2017-11-14 | Disposition: A | Discharge status: Home / Self Care | Payer: Medicare Other | Source: Ambulatory Visit | Attending: Primary Care | Admitting: Primary Care

## 2017-11-14 VITALS — BP 126/66 | HR 58 | Ht 66.0 in | Wt 231.0 lb

## 2017-11-14 DIAGNOSIS — R0609 Other forms of dyspnea: Secondary | ICD-10-CM

## 2017-11-14 DIAGNOSIS — R05 Cough: Secondary | ICD-10-CM | POA: Diagnosis not present

## 2017-11-14 DIAGNOSIS — J449 Chronic obstructive pulmonary disease, unspecified: Secondary | ICD-10-CM | POA: Diagnosis not present

## 2017-11-14 DIAGNOSIS — R053 Chronic cough: Secondary | ICD-10-CM

## 2017-11-14 DIAGNOSIS — Z23 Encounter for immunization: Secondary | ICD-10-CM | POA: Diagnosis not present

## 2017-11-14 DIAGNOSIS — I5032 Chronic diastolic (congestive) heart failure: Secondary | ICD-10-CM

## 2017-11-14 LAB — BRAIN NATRIURETIC PEPTIDE: PRO B NATRI PEPTIDE: 149 pg/mL — AB (ref 0.0–100.0)

## 2017-11-14 MED ORDER — FLUTTER DEVI: 0 refills | Status: AC

## 2017-11-14 MED ORDER — IPRATROPIUM-ALBUTEROL 0.5-2.5 (3) MG/3ML IN SOLN
3.0000 mL | Freq: Once | RESPIRATORY_TRACT | Status: AC
  Administered 2017-11-14: 3 mL via RESPIRATORY_TRACT

## 2017-11-14 NOTE — Patient Instructions (Addendum)
COPD: - Continue Trelegy, take 1 puff daily (every day) - Albuterol rescue inhaler 2 puffs every 4-6 hours as needed for sob/wheeze - If rescue inhaler is not effective then start nebulizer treatment every 4-6 hours as needed for sob/wheeze - Continue mucinex twice a day for congestion  Allergic rhinitis: Continue Zyrtec, flonase and Singulair   GERD: Antacid therapy daily as you are doing  Rx: Flutter valve - use three times or more for chest congestion  Office treatment: Duoneb x1   Orders:  Xray today  Labs- BNP   Follow-up: 4-6 weeks with Dr. Lake Bells or NP

## 2017-11-14 NOTE — Progress Notes (Signed)
@Patient  ID: Hunter [name], male    DOB: 22-Feb-1950, 67 y.o.   MRN: [id number]  Chief Complaint  Patient presents with  . Follow-up    1 wk rov.  pt states that he still has chest congestion, still has not received albuterol neb med, but has neb. notes some improvement since last week.     Referring provider: [name], MD  HPI: 67 year old male, former smoker quit in 2011. PMH COPD, allergic rhinitis, chronic cough. Patient of Dr. Lake Bells, last seen 11/04/17. Previously seen by Dr. Gwenette Greet back in 2014.   11/15/2017  Patient presents today for 2 week follow-up visit. During last visit he was treated for COPD exacerbation with Doxycyline and Prednisone taper. Started on Trelegy and given Albuterol neb treatments.   He is feeling better than he was, continues taking Trelegy inhaler every day. Hasn't seen a different yet with new inhaler but also knows he hasn't used it long enough to tell. Continues to have some nasal and chest congestion. Having difficulty getting mucus up. Congestion is worse late in the afternoon and at night. Taking Mucinex with no improvement, feels dry. Take lasix 40mg  daily. Feels its working.  Continues Singulair and nasal spray. Patient spoke with [facility] and he should be getting nebulizer medication today. Used rescue inhaler this morning.     Allergies  Allergen Reactions  . Codeine Nausea And Vomiting and Other (See Comments)    Severe stomach cramps  . Lisinopril Cough  . Ramipril Cough  . Tape Other (See Comments) and Tinitus    Only plastic tape--can use paper tape OK. Blisters    Immunization History  Administered Date(s) Administered  . Influenza Split 09/03/2011  . Influenza, High Dose Seasonal PF 10/09/2016, 11/14/2017  . Pneumococcal Conjugate-13 12/03/2011  . Pneumococcal Polysaccharide-23 11/06/2016    Past Medical History:  Diagnosis Date  . Bilateral lower extremity edema   . CAD (coronary artery disease) cardiologist-  dr  [name]   a. Remote MI with stent in 2005;  b. CABG x 5 in 2005;  c. 09/2010 Cath: LM nl, LAD 70p, 90-29m, D1 80p, LCX 61m, OM1 100, OM2 90ost, OM3 80, RCA 50m, VG->PDA->RPL nl, VG->Diag nl, VG->OM1 nl, LIMA->LAD nl, EF 60%-->Med Rx.  . Chronic venous insufficiency    post vein harvest for CABG  . COPD (chronic obstructive pulmonary disease) ([place])   . Diabetes mellitus type 2, controlled ([place])   . Difficult intravenous access 06-16-2015   multiple attempt IV starts until use of scanner  . ED (erectile dysfunction) of organic origin   . Foot drop, bilateral   . GERD (gastroesophageal reflux disease)   . H/O spinal cord injury    a. more or less wheelchair bound. post mulitple back surgery's including resection spinal tumor  . History of acute myocardial infarction of inferior wall    02/ 2005 inferoposterior  w/ stenting to RCA  . 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 History of cellulitis    left lower leg-- now resolved  . History of ulcer of lower limb    venous statis  ---  resolved  . Hypertension   . Increased liver enzymes    due to alcohol use  . Left rotator cuff tear   . Myocardial infarction [facility]) 2005   with stent placement  . Nocturia   . OA (osteoarthritis)    left shoulder  .  OAB (overactive bladder)   . OSA on CPAP   . PAD (peripheral artery disease) ([place])    followed by Dr. [name]  . Peripheral arterial occlusive disease ([place])    followed by dr fields  . 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 Type 2 diabetes mellitus ([place])   . Urinary incontinence, urge   . Wheelchair bound     Tobacco History: Social History   Tobacco Use  Smoking Status Former Smoker  . Packs/day: 0.50  . Years: 40.00  . Pack years: 20.00  . Types: Cigarettes  . Last attempt to quit: 01/01/2009  . Years since quitting: 8.8  Smokeless Tobacco Never Used  Tobacco Comment   as of 06-09-2015 per pt last month lite smoker    Counseling given: Not Answered Comment: as of 06-09-2015 per pt last month lite smoker   Outpatient Medications Prior to Visit  Medication Sig Dispense Refill  . aspirin 81 MG tablet Take 81 mg by mouth at bedtime.     . benzonatate (TESSALON) 200 MG capsule Take 1 capsule (200 mg total) by mouth every 8 (eight) hours as needed for cough. 45 capsule 1  . cetirizine (ZYRTEC) 10 MG tablet Take 10 mg by mouth daily.    . clopidogrel (PLAVIX) 75 MG tablet Take 75 mg by mouth daily.  12  . Docusate Calcium (STOOL SOFTENER PO) Take 100 mg by mouth every evening.     [name] doxazosin (CARDURA) 2 MG tablet TAKE 1 TABLET (2 MG TOTAL) BY MOUTH AT BEDTIME. 30 tablet 12  . fluticasone (FLONASE) 50 MCG/ACT nasal spray Place 1 spray into both nostrils at bedtime as needed for allergies.     . Fluticasone-Umeclidin-Vilant (TRELEGY ELLIPTA) 100-62.5-25 MCG/INH AEPB Inhale 1 puff into the lungs daily. 1 each 0  . furosemide (LASIX) 40 MG tablet Take 1 tablet (40 mg total) by mouth daily. 90 tablet 3  . gabapentin (NEURONTIN) 400 MG capsule Take 400 mg by mouth 2 (two) times daily.     [name] HUMALOG KWIKPEN 100 UNIT/ML SOPN Inject 24 Units into the skin 3 (three) times daily.     [name] ibuprofen (ADVIL,MOTRIN) 200 MG tablet Take 400 mg by mouth every 6 (six) hours as needed for headache or moderate pain.    [name] ketotifen (ZADITOR) 0.025 % ophthalmic solution Place 2 drops as needed into both eyes (for allergies).     . metoprolol tartrate (LOPRESSOR) 50 MG tablet Take 1 tablet (50 mg total) by mouth 2 (two) times daily. 180 tablet 3  . montelukast (SINGULAIR) 10 MG tablet Take 1 tablet (10 mg total) by mouth at bedtime. 30 tablet 2  . nitroGLYCERIN (NITROSTAT) 0.4 MG SL tablet Place 1 tablet (0.4 mg total) under the tongue every 5 (five) minutes as needed for chest pain (chest pain). 25 tablet prn  . omeprazole (PRILOSEC) 40 MG capsule Take 40 mg by mouth 2 (two) times daily.    [name] oxybutynin (DITROPAN XL) 15 MG 24 hr  tablet Take 15 mg by mouth at bedtime.     . potassium chloride SA (K-DUR,KLOR-CON) 20 MEQ tablet Take 1 tablet (20 mEq total) by mouth daily. 90 tablet 3  . pravastatin (PRAVACHOL) 40 MG tablet Take 40 mg by mouth daily.  2  . predniSONE (DELTASONE) 10 MG tablet 40 mg X 3 days, 30mg  X 3 days, 20mg  X 3 days, 10mg  X 3 days, then stop 30 tablet 0  . PROAIR HFA 108 (90 Base)  MCG/ACT inhaler INHALE 2-4 PUFFS UP TO 4 TIMES DAILY AS NEEDED FOR WHEEZING  3  . tamsulosin (FLOMAX) 0.4 MG CAPS capsule Take 0.4 mg by mouth daily.    . traMADol (ULTRAM) 50 MG tablet Take 1 tablet (50 mg total) 3 (three) times daily as needed by mouth for moderate pain. 30 tablet 4  . TRESIBA FLEXTOUCH 200 UNIT/ML SOPN Inject 46 Units into the skin daily.  6  . triamterene-hydrochlorothiazide (MAXZIDE-25) 37.5-25 MG per tablet Take 1 tablet by mouth daily.     . Vitamin D, Ergocalciferol, (DRISDOL) 50000 units CAPS capsule Take 50,000 Units by mouth every Wednesday.   3  . albuterol (PROVENTIL) (2.5 MG/3ML) 0.083% nebulizer solution Take 3 mLs (2.5 mg total) by nebulization every 6 (six) hours and as needed for wheezing or shortness of breath. (Patient not taking: Reported on 11/14/2017) 120 mL 11  . doxycycline (VIBRA-TABS) 100 MG tablet TAKE 1 TABLET BY MOUTH TWICE A DAY FOR 7 DAYS  0  . umeclidinium-vilanterol (ANORO ELLIPTA) 62.5-25 MCG/INH AEPB Inhale 1 puff into the lungs daily.     No facility-administered medications prior to visit.     Review of Systems  Review of Systems  Respiratory: Positive for cough.     Physical Exam  BP 126/66 (BP Location: Right Arm, Cuff Size: Normal)   Pulse (!) 58   Ht 5\' 6"  (1.676 m)   Wt 231 lb (104.8 kg)   SpO2 99%   BMI 37.28 kg/m  Physical Exam  Constitutional: He is oriented to person, place, and time. He appears well-developed and well-nourished. No distress.  HENT:  Head: Normocephalic and atraumatic.  Eyes: Pupils are equal, round, and reactive to light. EOM are  normal.  Neck: Normal range of motion. Neck supple.  Cardiovascular: Normal rate and regular rhythm.  Pulmonary/Chest: Effort normal. No respiratory distress. He has no wheezes.  Scattered rhonchi. NO wheeze.  Musculoskeletal: Normal range of motion.  Neurological: He is alert and oriented to person, place, and time.  Skin: Skin is warm.     Lab Results:  CBC    Component Value Date/Time   WBC 5.9 08/22/2017 1159   WBC 6.9 11/06/2016 0756   RBC 4.27 08/22/2017 1159   RBC 4.18 (L) 11/06/2016 0756   HGB 13.1 08/22/2017 1159   HCT 39.0 08/22/2017 1159   PLT 223 08/22/2017 1159   MCV 91 08/22/2017 1159   MCH 30.7 08/22/2017 1159   MCH 30.9 11/06/2016 0756   MCHC 33.6 08/22/2017 1159   MCHC 32.8 11/06/2016 0756   RDW 13.3 08/22/2017 1159   LYMPHSABS 0.4 (L) 11/06/2016 0756   MONOABS 0.7 11/06/2016 0756   EOSABS 0.0 11/06/2016 0756   BASOSABS 0.0 11/06/2016 0756    BMET    Component Value Date/Time   NA 138 10/03/2017 1127   K 4.3 10/03/2017 1127   CL 98 10/03/2017 1127   CO2 21 10/03/2017 1127   GLUCOSE 154 (H) 10/03/2017 1127   GLUCOSE 134 (H) 11/06/2016 0756   BUN 30 (H) 10/03/2017 1127   CREATININE 1.31 (H) 10/03/2017 1127   CALCIUM 9.2 10/03/2017 1127   GFRNONAA 56 (L) 10/03/2017 1127   GFRAA 65 10/03/2017 1127    BNP No results found for: BNP  ProBNP    Component Value Date/Time   PROBNP 149.0 (H) 11/14/2017 1103    Imaging: Dg Chest 2 View  Result Date: 11/14/2017 CLINICAL DATA:  Cough and congestion. EXAM: CHEST - 2 VIEW COMPARISON:  Chest  x-ray 09/07/2010. FINDINGS: Mediastinum and hilar structures normal are stable. Slight prominence of the mediastinum most likely prominent great vessels. This finding is stable. Prior CABG. Cardiomegaly. No pulmonary venous congestion. No focal infiltrate. No pleural effusion or pneumothorax. Degenerative change thoracic spine. Prior cervical spine fusion. IMPRESSION: 1.  Prior CABG.  Cardiomegaly.  No pulmonary  venous congestion. 2.  No focal infiltrate.  Chest is stable from prior exam. Electronically Signed   By: Marcello Moores  Register   On: 11/14/2017 15:55   Vas [place] Burnard Bunting With/wo Tbi  Result Date: 10/24/2017 LOWER EXTREMITY DOPPLER STUDY Indications: Peripheral artery disease. High Risk Factors: Hypertension, hyperlipidemia, Diabetes, past history of                    smoking, coronary artery disease.  Performing Technologist: Burley Saver RVT  Examination Guidelines: A complete evaluation includes at minimum, Doppler waveform signals and systolic blood pressure reading at the level of bilateral brachial, anterior tibial, and posterior tibial arteries, when vessel segments are accessible. Bilateral testing is considered an integral part of a complete examination. Photoelectric Plethysmograph (PPG) waveforms and toe systolic pressure readings are included as required and additional duplex testing as needed. Limited examinations for reoccurring indications may be performed as noted.  ABI Findings: +---------+------------------+-----+----------+--------+ Right    Rt Pressure (mmHg)IndexWaveform  Comment  +---------+------------------+-----+----------+--------+ Brachial 158                                       +---------+------------------+-----+----------+--------+ PTA      110               0.70 monophasic         +---------+------------------+-----+----------+--------+ DP       97                0.61 monophasic         +---------+------------------+-----+----------+--------+ Great Toe76                0.48                    +---------+------------------+-----+----------+--------+ +---------+------------------+-----+----------+-------+ Left     Lt Pressure (mmHg)IndexWaveform  Comment +---------+------------------+-----+----------+-------+ Brachial 157                                      +---------+------------------+-----+----------+-------+ PTA      75                0.47  monophasic        +---------+------------------+-----+----------+-------+ DP       87                0.55 monophasic        +---------+------------------+-----+----------+-------+ Great Toe72                0.46                   +---------+------------------+-----+----------+-------+ +-------+-----------+-----------+------------+------------+ ABI/TBIToday's ABIToday's TBIPrevious ABIPrevious TBI +-------+-----------+-----------+------------+------------+ Right  0.70       0.48       0.52        0.45         +-------+-----------+-----------+------------+------------+ Left   0.55       0.46       0.51        0.34         +-------+-----------+-----------+------------+------------+  Right ABIs appear increased compared to prior study on 03/29/2016. Left ABIs appear essentially unchanged compared to prior study on 03/29/2016. Right TBIs appear essentially unchanged, and left TBIs appear slightly increased when compared to previous study.  Summary: Right: Resting right ankle-brachial index indicates moderate right lower extremity arterial disease. The right toe-brachial index is abnormal. RT great toe pressure = 76 mmHg. Left: Resting left ankle-brachial index indicates moderate left lower extremity arterial disease. The left toe-brachial index is abnormal. LT Great toe pressure = 72 mmHg.  *See table(s) above for measurements and observations.  Electronically signed by Ruta Hinds MD on 10/24/2017 at 2:55:30 PM.    Final      Assessment & Plan:   COPD (chronic obstructive pulmonary disease) Some improvement with Doxycyline course and prednisone taper. Still complains of congestion. Awaiting Albuterol nebulizer medication. Given Duoneb in office x1 and performed CPT. Marked improvement in lung sounds. CXR showed no evidence of focal infiltrate, pulmonary venous congestion or pleural effusion. Needs flutter valve. FU in 4-6 weeks   Chronic diastolic heart failure ([place]) ECHO  08/2017 with EF 55-60%, G1DD BNP elevated 149, takes lasix 40mg  daily (feels it's working and reports feeling dry). Needs to follow-up with PCP or cardiology for management    Martyn Ehrich, NP 11/15/2017

## 2017-11-15 ENCOUNTER — Telehealth: Payer: Self-pay | Admitting: Cardiology

## 2017-11-15 DIAGNOSIS — I5032 Chronic diastolic (congestive) heart failure: Secondary | ICD-10-CM | POA: Insufficient documentation

## 2017-11-15 NOTE — Progress Notes (Signed)
Reviewed, agree 

## 2017-11-15 NOTE — Assessment & Plan Note (Signed)
ECHO 08/2017 with EF 55-60%, G1DD BNP elevated 149, takes lasix 40mg  daily (feels it's working and reports feeling dry). Needs to follow-up with PCP or cardiology for management

## 2017-11-15 NOTE — Telephone Encounter (Signed)
New message   Pt c/o medication issue:  1. Name of Medication: furosemide (LASIX) 40 MG tablet  2. How are you currently taking this medication (dosage and times per day)?1 time daily  3. Are you having a reaction (difficulty breathing--STAT)?no   4. What is your medication issue? Patient had blood work on yesterday at [facility] and states BNT level was elevated. Patient wants to know if lasix dosage needs to be changed? Please advise.

## 2017-11-15 NOTE — Assessment & Plan Note (Addendum)
Some improvement with Doxycyline course and prednisone taper. Still complains of congestion. Awaiting Albuterol nebulizer medication. Given Duoneb in office x1 and performed CPT. Marked improvement in lung sounds. CXR showed no evidence of focal infiltrate, pulmonary venous congestion or pleural effusion. Needs flutter valve. FU in 4-6 weeks

## 2017-11-15 NOTE — Telephone Encounter (Signed)
Lm to call back ./cy 

## 2017-11-15 NOTE — Telephone Encounter (Signed)
Called pt  re message was told by pulmonary to call cardiology re BNP of 149 to see if lasix needed to be adjusted Per pt feels fine no swelling or SOB Informed pt this value is good should not need to adjust med Will forward to Dr [name] for review .Adonis Housekeeper

## 2017-11-19 ENCOUNTER — Other Ambulatory Visit (INDEPENDENT_AMBULATORY_CARE_PROVIDER_SITE_OTHER): Payer: Medicare Other

## 2017-11-19 ENCOUNTER — Ambulatory Visit: Payer: Medicare Other | Admitting: Gastroenterology

## 2017-11-19 ENCOUNTER — Encounter: Payer: Self-pay | Admitting: Gastroenterology

## 2017-11-19 VITALS — BP 152/60 | HR 76 | Ht 66.0 in | Wt 233.0 lb

## 2017-11-19 DIAGNOSIS — K625 Hemorrhage of anus and rectum: Secondary | ICD-10-CM

## 2017-11-19 DIAGNOSIS — K648 Other hemorrhoids: Secondary | ICD-10-CM | POA: Diagnosis not present

## 2017-11-19 LAB — CBC WITH DIFFERENTIAL/PLATELET
BASOS PCT: 0.6 % (ref 0.0–3.0)
Basophils Absolute: 0.1 10*3/uL (ref 0.0–0.1)
Eosinophils Absolute: 0.1 10*3/uL (ref 0.0–0.7)
Eosinophils Relative: 1.3 % (ref 0.0–5.0)
HEMATOCRIT: 40.4 % (ref 39.0–52.0)
HEMOGLOBIN: 13.4 g/dL (ref 13.0–17.0)
Lymphocytes Relative: 16.4 % (ref 12.0–46.0)
Lymphs Abs: 1.5 10*3/uL (ref 0.7–4.0)
MCHC: 33.2 g/dL (ref 30.0–36.0)
MCV: 94.6 fl (ref 78.0–100.0)
MONO ABS: 0.8 10*3/uL (ref 0.1–1.0)
Monocytes Relative: 9.5 % (ref 3.0–12.0)
Neutro Abs: 6.4 10*3/uL (ref 1.4–7.7)
Neutrophils Relative %: 72.2 % (ref 43.0–77.0)
Platelets: 171 10*3/uL (ref 150.0–400.0)
RBC: 4.27 Mil/uL (ref 4.22–5.81)
RDW: 14.1 % (ref 11.5–15.5)
WBC: 8.9 10*3/uL (ref 4.0–10.5)

## 2017-11-19 NOTE — Progress Notes (Signed)
[facility] Progress Note  Chief Complaint: Rectal bleeding  Subjective  History:  Hunter [name] was sent by primary care for reevaluation of ongoing rectal bleeding. I saw him in October 2017 for constipation and rectal bleeding.  On 11/21/2015, he underwent colonoscopy at the hospital endoscopy lab.  It was a complete exam to the cecum with excellent preparation.  Internal hemorrhoids were found.  He reports having continued episodic rectal bleeding, more so in the last several months.  He will have some crampy lower abdominal pain with urgency for bowel movements, and has been having bleeding with nearly every bowel movement.  He has had prior final cord injury and altered sensation below the waist. He stopped his Plavix of his own accord sometime in the last few weeks due to this bleeding.  ROS: Cardiovascular:  no chest pain Respiratory: Chronic cough and dyspnea from COPD  The patient's Past Medical, Family and Social History were reviewed and are on file in the EMR.  Objective:  Med list reviewed  Current Outpatient Medications:  .  albuterol (PROVENTIL) (2.5 MG/3ML) 0.083% nebulizer solution, Take 3 mLs (2.5 mg total) by nebulization every 6 (six) hours and as needed for wheezing or shortness of breath., Disp: 120 mL, Rfl: 11 .  aspirin 81 MG tablet, Take 81 mg by mouth at bedtime. , Disp: , Rfl:  .  benzonatate (TESSALON) 200 MG capsule, Take 1 capsule (200 mg total) by mouth every 8 (eight) hours as needed for cough., Disp: 45 capsule, Rfl: 1 .  cetirizine (ZYRTEC) 10 MG tablet, Take 10 mg by mouth daily., Disp: , Rfl:  .  Docusate Calcium (STOOL SOFTENER PO), Take 100 mg by mouth every evening. , Disp: , Rfl:  .  doxazosin (CARDURA) 2 MG tablet, TAKE 1 TABLET (2 MG TOTAL) BY MOUTH AT BEDTIME., Disp: 30 tablet, Rfl: 12 .  fluticasone (FLONASE) 50 MCG/ACT nasal spray, Place 1 spray into both nostrils at bedtime as needed for allergies. , Disp: , Rfl:  .   Fluticasone-Umeclidin-Vilant (TRELEGY ELLIPTA) 100-62.5-25 MCG/INH AEPB, Inhale 1 puff into the lungs daily., Disp: 1 each, Rfl: 0 .  furosemide (LASIX) 40 MG tablet, Take 1 tablet (40 mg total) by mouth daily., Disp: 90 tablet, Rfl: 3 .  gabapentin (NEURONTIN) 400 MG capsule, Take 400 mg by mouth 2 (two) times daily. , Disp: , Rfl:  .  HUMALOG KWIKPEN 100 UNIT/ML SOPN, Inject 24 Units into the skin 3 (three) times daily. , Disp: , Rfl:  .  ibuprofen (ADVIL,MOTRIN) 200 MG tablet, Take 400 mg by mouth every 6 (six) hours as needed for headache or moderate pain., Disp: , Rfl:  .  ketotifen (ZADITOR) 0.025 % ophthalmic solution, Place 2 drops as needed into both eyes (for allergies). , Disp: , Rfl:  .  metoprolol tartrate (LOPRESSOR) 50 MG tablet, Take 1 tablet (50 mg total) by mouth 2 (two) times daily., Disp: 180 tablet, Rfl: 3 .  montelukast (SINGULAIR) 10 MG tablet, Take 1 tablet (10 mg total) by mouth at bedtime., Disp: 30 tablet, Rfl: 2 .  nitroGLYCERIN (NITROSTAT) 0.4 MG SL tablet, Place 1 tablet (0.4 mg total) under the tongue every 5 (five) minutes as needed for chest pain (chest pain)., Disp: 25 tablet, Rfl: prn .  omeprazole (PRILOSEC) 40 MG capsule, Take 40 mg by mouth 2 (two) times daily., Disp: , Rfl:  .  oxybutynin (DITROPAN XL) 15 MG 24 hr tablet, Take 15 mg by mouth at bedtime. , Disp: ,  Rfl:  .  potassium chloride SA (K-DUR,KLOR-CON) 20 MEQ tablet, Take 1 tablet (20 mEq total) by mouth daily., Disp: 90 tablet, Rfl: 3 .  pravastatin (PRAVACHOL) 40 MG tablet, Take 40 mg by mouth daily., Disp: , Rfl: 2 .  predniSONE (DELTASONE) 10 MG tablet, 40 mg X 3 days, 30mg  X 3 days, 20mg  X 3 days, 10mg  X 3 days, then stop, Disp: 30 tablet, Rfl: 0 .  PROAIR HFA 108 (90 Base) MCG/ACT inhaler, INHALE 2-4 PUFFS UP TO 4 TIMES DAILY AS NEEDED FOR WHEEZING, Disp: , Rfl: 3 .  Respiratory Therapy Supplies (FLUTTER) DEVI, Use as directed, Disp: 1 each, Rfl: 0 .  tamsulosin (FLOMAX) 0.4 MG CAPS capsule, Take  0.4 mg by mouth daily., Disp: , Rfl:  .  traMADol (ULTRAM) 50 MG tablet, Take 1 tablet (50 mg total) 3 (three) times daily as needed by mouth for moderate pain., Disp: 30 tablet, Rfl: 4 .  TRESIBA FLEXTOUCH 200 UNIT/ML SOPN, Inject 46 Units into the skin daily., Disp: , Rfl: 6 .  triamterene-hydrochlorothiazide (MAXZIDE-25) 37.5-25 MG per tablet, Take 1 tablet by mouth daily. , Disp: , Rfl:  .  Vitamin D, Ergocalciferol, (DRISDOL) 50000 units CAPS capsule, Take 50,000 Units by mouth every Wednesday. , Disp: , Rfl: 3 .  clopidogrel (PLAVIX) 75 MG tablet, Take 75 mg by mouth daily., Disp: , Rfl: 12   Vital signs in last 24 hrs: Vitals:   11/19/17 1121  BP: (!) 152/60  Pulse: 76    Physical Exam  Chronically ill-appearing man, arrived in a wheelchair, able to get on exam table with assistance.  Abdomen soft nondistended nontender  Rectal: Normal external, no tenderness, no fissure no palpable internal lesions.  Anoscopy reveals 3 columns of internal hemorrhoids.  He says that when he strains he can feel something protruding, though that could not be reproduced today.    This appears to be chronic hemorrhoidal bleeding exacerbated by antiplatelet therapy.  He needs to be on Evex due to his cardiovascular history.  I therefore strongly recommended that he resume it immediately.  We discussed hemorrhoidal banding as a possible solution to this bleeding.  If it fails that, he will need colorectal surgery evaluation.  Banding was described in detail and a brochure was given as well.  Risks and benefits were reviewed and he was agreeable.  PROCEDURE NOTE: The patient presents with symptomatic grade 2 hemorrhoids, requesting rubber band ligation of his/her hemorrhoidal disease. All risks, benefits and alternative forms of therapy were described and informed consent was obtained.   The anorectum was pre-medicated with 0.125% NTG and lubricant. The decision was made to band the RP  internal hemorrhoids, and the [place] was used to perform band ligation without complication. Digital anorectal examination was then performed to assure proper positioning of the band, and to adjust the banded tissue as required. The patient was discharged home without pain or other issues. Dietary and behavioral recommendations were given and along with follow-up instructions.   The following adjunctive treatments were recommended:  None CBC will be obtained today  The patient will return 2 to 3 weeks for follow-up and possible additional banding as required.  This can be done while on Plavix. No complications were encountered and the patient tolerated the procedure well.   Hunter [name]

## 2017-11-19 NOTE — Patient Instructions (Addendum)
If you are age 67 or older, your body mass index should be between 23-30. Your Body mass index is 37.61 kg/m. If this is out of the aforementioned range listed, please consider follow up with your Primary Care Provider.  If you are age 18 or younger, your body mass index should be between 19-25. Your Body mass index is 37.61 kg/m. If this is out of the aformentioned range listed, please consider follow up with your Primary Care Provider.   HEMORRHOID BANDING PROCEDURE    FOLLOW-UP CARE   1. The procedure you have had should have been relatively painless since the banding of the area involved does not have nerve endings and there is no pain sensation.  The rubber band cuts off the blood supply to the hemorrhoid and the band may fall off as soon as 48 hours after the banding (the band may occasionally be seen in the toilet bowl following a bowel movement). You may notice a temporary feeling of fullness in the rectum which should respond adequately to plain Tylenol or Motrin.  2. Following the banding, avoid strenuous exercise that evening and resume full activity the next day.  A sitz bath (soaking in a warm tub) or bidet is soothing, and can be useful for cleansing the area after bowel movements.     3. To avoid constipation, take two tablespoons of natural wheat bran, natural oat bran, flax, Benefiber or any over the counter fiber supplement and increase your water intake to 7-8 glasses daily.    4. Unless you have been prescribed anorectal medication, do not put anything inside your rectum for two weeks: No suppositories, enemas, fingers, etc.  5. Occasionally, you may have more bleeding than usual after the banding procedure.  This is often from the untreated hemorrhoids rather than the treated one.  Don't be concerned if there is a tablespoon or so of blood.  If there is more blood than this, lie flat with your bottom higher than your head and apply an ice pack to the area. If the bleeding  does not stop within a half an hour or if you feel faint, call our office at [phone] or go to the emergency room.  6. Problems are not common; however, if there is a substantial amount of bleeding, severe pain, chills, fever or difficulty passing urine (very rare) or other problems, you should call us at [phone] or report to the nearest emergency room.  7. Do not stay seated continuously for more than 2-3 hours for a day or two after the procedure.  Tighten your buttock muscles 10-15 times every two hours and take 10-15 deep breaths every 1-2 hours.  Do not spend more than a few minutes on the toilet if you cannot empty your bowel; instead re-visit the toilet at a later time.      It was a pleasure to see you today!  Dr. Loletha Carrow

## 2017-11-21 NOTE — Telephone Encounter (Signed)
I am okay continuing the Lasix at current dosing.  Make sure that he is decreasing salt intake.  Limit fluid intake to approximately 1.5 L a day.  Continue to work on weight loss. [name], MD

## 2017-11-25 NOTE — Telephone Encounter (Signed)
Pt aware recommendations ./cy

## 2017-12-04 ENCOUNTER — Encounter: Payer: Self-pay | Admitting: Gastroenterology

## 2017-12-04 ENCOUNTER — Ambulatory Visit: Payer: Medicare Other | Admitting: Gastroenterology

## 2017-12-04 DIAGNOSIS — K648 Other hemorrhoids: Secondary | ICD-10-CM | POA: Diagnosis not present

## 2017-12-04 NOTE — Patient Instructions (Signed)
If you are age 67 or older, your body mass index should be between 23-30. Your Body mass index is 39.44 kg/m. If this is out of the aforementioned range listed, please consider follow up with your Primary Care Provider.  If you are age 29 or younger, your body mass index should be between 19-25. Your Body mass index is 39.44 kg/m. If this is out of the aformentioned range listed, please consider follow up with your Primary Care Provider.   HEMORRHOID BANDING PROCEDURE    FOLLOW-UP CARE   1. The procedure you have had should have been relatively painless since the banding of the area involved does not have nerve endings and there is no pain sensation.  The rubber band cuts off the blood supply to the hemorrhoid and the band may fall off as soon as 48 hours after the banding (the band may occasionally be seen in the toilet bowl following a bowel movement). You may notice a temporary feeling of fullness in the rectum which should respond adequately to plain Tylenol or Motrin.  2. Following the banding, avoid strenuous exercise that evening and resume full activity the next day.  A sitz bath (soaking in a warm tub) or bidet is soothing, and can be useful for cleansing the area after bowel movements.     3. To avoid constipation, take two tablespoons of natural wheat bran, natural oat bran, flax, Benefiber or any over the counter fiber supplement and increase your water intake to 7-8 glasses daily.    4. Unless you have been prescribed anorectal medication, do not put anything inside your rectum for two weeks: No suppositories, enemas, fingers, etc.  5. Occasionally, you may have more bleeding than usual after the banding procedure.  This is often from the untreated hemorrhoids rather than the treated one.  Don't be concerned if there is a tablespoon or so of blood.  If there is more blood than this, lie flat with your bottom higher than your head and apply an ice pack to the area. If the bleeding  does not stop within a half an hour or if you feel faint, call our office at [phone] or go to the emergency room.  6. Problems are not common; however, if there is a substantial amount of bleeding, severe pain, chills, fever or difficulty passing urine (very rare) or other problems, you should call us at [phone] or report to the nearest emergency room.  7. Do not stay seated continuously for more than 2-3 hours for a day or two after the procedure.  Tighten your buttock muscles 10-15 times every two hours and take 10-15 deep breaths every 1-2 hours.  Do not spend more than a few minutes on the toilet if you cannot empty your bowel; instead re-visit the toilet at a later time.      It was a pleasure to see you today!  Dr. Loletha Carrow

## 2017-12-04 NOTE — Progress Notes (Signed)
Recent Hgb normal Bleeding decreased but not stopped after recent banding RP column. Procedure done on plavix because patient high risk to stop it.  PROCEDURE NOTE: The patient presents with symptomatic grade 2 hemorrhoids, requesting rubber band ligation of his/her hemorrhoidal disease. All risks, benefits and alternative forms of therapy were described and informed consent was obtained.  DRE revealed: normal   The anorectum was pre-medicated with 0.125% NTG and lubricant. The decision was made to band the RA internal hemorrhoids, and the [place] was used to perform band ligation without complication. Digital anorectal examination was then performed to assure proper positioning of the band, and to adjust the banded tissue as required. The patient was discharged home without pain or other issues. Dietary and behavioral recommendations were given and along with follow-up instructions.   The following adjunctive treatments were recommended:  none  The patient will return 2-3 weeks  for follow-up and possible additional banding as required. No complications were encountered and the patient tolerated the procedure well.

## 2017-12-05 ENCOUNTER — Ambulatory Visit: Payer: Medicare Other | Admitting: Podiatry

## 2017-12-12 ENCOUNTER — Encounter: Payer: Self-pay | Admitting: Primary Care

## 2017-12-12 ENCOUNTER — Ambulatory Visit: Payer: Medicare Other | Admitting: Primary Care

## 2017-12-12 VITALS — BP 124/70 | HR 71 | Temp 97.9°F | Ht 66.0 in

## 2017-12-12 DIAGNOSIS — I5032 Chronic diastolic (congestive) heart failure: Secondary | ICD-10-CM | POA: Diagnosis not present

## 2017-12-12 DIAGNOSIS — R053 Chronic cough: Secondary | ICD-10-CM

## 2017-12-12 DIAGNOSIS — R05 Cough: Secondary | ICD-10-CM | POA: Diagnosis not present

## 2017-12-12 DIAGNOSIS — J449 Chronic obstructive pulmonary disease, unspecified: Secondary | ICD-10-CM

## 2017-12-12 DIAGNOSIS — R0609 Other forms of dyspnea: Secondary | ICD-10-CM

## 2017-12-12 LAB — COMPREHENSIVE METABOLIC PANEL WITH GFR
ALT: 16 U/L (ref 0–53)
AST: 17 U/L (ref 0–37)
Albumin: 4.3 g/dL (ref 3.5–5.2)
Alkaline Phosphatase: 35 U/L — ABNORMAL LOW (ref 39–117)
BUN: 28 mg/dL — ABNORMAL HIGH (ref 6–23)
CO2: 34 meq/L — ABNORMAL HIGH (ref 19–32)
Calcium: 9.3 mg/dL (ref 8.4–10.5)
Chloride: 99 meq/L (ref 96–112)
Creatinine, Ser: 1.33 mg/dL (ref 0.40–1.50)
GFR: 56.94 mL/min — ABNORMAL LOW
Glucose, Bld: 177 mg/dL — ABNORMAL HIGH (ref 70–99)
Potassium: 3.8 meq/L (ref 3.5–5.1)
Sodium: 139 meq/L (ref 135–145)
Total Bilirubin: 0.4 mg/dL (ref 0.2–1.2)
Total Protein: 7.2 g/dL (ref 6.0–8.3)

## 2017-12-12 LAB — D-DIMER, QUANTITATIVE: D-Dimer, Quant: 0.44 mcg/mL FEU (ref <0.50)

## 2017-12-12 LAB — BRAIN NATRIURETIC PEPTIDE: Pro B Natriuretic peptide (BNP): 70 pg/mL (ref 0.0–100.0)

## 2017-12-12 MED ORDER — FLUTICASONE-UMECLIDIN-VILANT 100-62.5-25 MCG/INH IN AEPB: 1.0000 | INHALATION_SPRAY | Freq: Every day | RESPIRATORY_TRACT | 1 refills | Status: DC

## 2017-12-12 NOTE — Patient Instructions (Addendum)
Needs full PFTs with DLCO   Labs today  Call ENT and make follow-up visit with them  Re-try trelegy 1 puff daily  Use flutter valve three times a day  Use Albuterol nebulizer every 4-6 hours for sob or wheezing   Continue to work on diet and exercise   Follow up in 6-8 weeks with Dr. Lake Bells

## 2017-12-12 NOTE — Progress Notes (Signed)
@Patient  ID: Hunter [name], male    DOB: December 25, 1950, 67 y.o.   MRN: [id number]  Chief Complaint  Patient presents with  . Follow-up    cough with lt yellow mucus, stuck in chest better than what it was, SOB with exertion, chest dull tightness with exertion    Referring provider: [name], MD   HPI: 67 year old male, former smoker quit in 2011. PMH COPD, allergic rhinitis, chronic cough. Patient of Dr. Lake Bells, last seen 11/04/17. Previously seen by Dr. Gwenette Greet back in 2014.   11/15/2017  Patient presents today for 2 week follow-up visit. During last visit he was treated for COPD exacerbation with Doxycyline and Prednisone taper. Started on Trelegy and given Albuterol neb treatments.   He is feeling better than he was, continues taking Trelegy inhaler every day. Hasn't seen a different yet with new inhaler but also knows he hasn't used it long enough to tell. Continues to have some nasal and chest congestion. Having difficulty getting mucus up. Congestion is worse late in the afternoon and at night. Taking Mucinex with no improvement, feels dry. Take lasix 40mg  daily. Feels its working.  Continues Singulair and nasal spray. Patient spoke with [employer] and he should be getting nebulizer medication today. Used rescue inhaler this morning.   12/13/2017 Patient presents today for 4-6 week follow-up. He was given flutter valve during last office visit and was awaiting albuterol nebulizer. BNP was elevated at 149, he takes lasix 40mg  daily and feels it's working. Advised pt to follow-up with PCP/cardiology for medication management.   Complains of continued shortness of breath with exertion but reports not as bad as he was. States that by the time he gets out of his chair to walk a short distance he becomes sob. He also complains of dyspnea with showering. He was given sample of Trelegy,  states that he only tried it for a short period of time and was not sure if it made a difference.  Contionues with Anoro and prn Albuterol which helps some. Associated dry cough with upper airway congestion that he is unable to get up. Took mucinex with no improvement. States that he has been using his flutter valve 2-3 times a day. He had recent leg swelling a week ago and his lasix was increased to 2 tabs daily. Follows with Dr. Loletha Carrow from GI, takes omeprazole twice daily.      TESTING: Normal [place] CT  Allergy skin testing neg  Spirometry 11/04/17- FVC 1.7 (48%), FEV1 1.3(48%), ratio 48  Echocardiogram 08/27/17- EF 55-60%, G1DD, PA pressure normal  CXR- prior CABG, cardiomegly. No pulmonary congestion. No focal infiltrate.   Allergies  Allergen Reactions  . Codeine Nausea And Vomiting and Other (See Comments)    Severe stomach cramps  . Lisinopril Cough  . Ramipril Cough  . Tape Other (See Comments) and Tinitus    Only plastic tape--can use paper tape OK. Blisters    Immunization History  Administered Date(s) Administered  . Influenza Split 09/03/2011  . Influenza, High Dose Seasonal PF 10/09/2016, 11/14/2017  . Pneumococcal Conjugate-13 12/03/2011  . Pneumococcal Polysaccharide-23 11/06/2016    Past Medical History:  Diagnosis Date  . Bilateral lower extremity edema   . CAD (coronary artery disease) cardiologist-  dr [name]   a. Remote MI with stent in 2005;  b. CABG x 5 in 2005;  c. 09/2010 Cath: LM nl, LAD 70p, 90-20m, D1 80p, LCX 30m, OM1 100, OM2 90ost, OM3 80, RCA 4m, VG->PDA->RPL nl,  VG->Diag nl, VG->OM1 nl, LIMA->LAD nl, EF 60%-->Med Rx.  . Chronic venous insufficiency    post vein harvest for CABG  . COPD (chronic obstructive pulmonary disease) ([place])   . Diabetes mellitus type 2, controlled ([place])   . Difficult intravenous access 06-16-2015   multiple attempt IV starts until use of scanner  . ED (erectile dysfunction) of organic origin   . Foot drop, bilateral   . GERD (gastroesophageal reflux disease)   . H/O spinal cord injury    a. more or less  wheelchair bound. post mulitple back surgery's including resection spinal tumor  . History of acute myocardial infarction of inferior wall    02/ 2005 inferoposterior  w/ stenting to RCA  . 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 History of cellulitis    left lower leg-- now resolved  . History of ulcer of lower limb    venous statis  ---  resolved  . Hypertension   . Increased liver enzymes    due to alcohol use  . Left rotator cuff tear   . Myocardial infarction [facility]) 2005   with stent placement  . Nocturia   . OA (osteoarthritis)    left shoulder  . OAB (overactive bladder)   . OSA on CPAP   . PAD (peripheral artery disease) ([place])    followed by Dr. [name]  . Peripheral arterial occlusive disease ([place])    followed by dr fields  . 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 Type 2 diabetes mellitus ([place])   . Urinary incontinence, urge   . Wheelchair bound     Tobacco History: Social History   Tobacco Use  Smoking Status Former Smoker  . Packs/day: 0.50  . Years: 40.00  . Pack years: 20.00  . Types: Cigarettes  . Last attempt to quit: 01/01/2009  . Years since quitting: 8.9  Smokeless Tobacco Never Used  Tobacco Comment   as of 06-09-2015 per pt last month lite smoker   Counseling given: Not Answered Comment: as of 06-09-2015 per pt last month lite smoker   Outpatient Medications Prior to Visit  Medication Sig Dispense Refill  . albuterol (PROVENTIL) (2.5 MG/3ML) 0.083% nebulizer solution Take 3 mLs (2.5 mg total) by nebulization every 6 (six) hours and as needed for wheezing or shortness of breath. 120 mL 11  . aspirin 81 MG tablet Take 81 mg by mouth at bedtime.     . cetirizine (ZYRTEC) 10 MG tablet Take 10 mg by mouth daily.    . clopidogrel (PLAVIX) 75 MG tablet Take 75 mg by mouth daily.  12  . Docusate Calcium (STOOL SOFTENER PO) Take 100 mg by mouth every evening.     [name] doxazosin (CARDURA) 2 MG tablet  TAKE 1 TABLET (2 MG TOTAL) BY MOUTH AT BEDTIME. 30 tablet 12  . fluticasone (FLONASE) 50 MCG/ACT nasal spray Place 1 spray into both nostrils at bedtime as needed for allergies.     . furosemide (LASIX) 40 MG tablet Take 1 tablet (40 mg total) by mouth daily. 90 tablet 3  . gabapentin (NEURONTIN) 400 MG capsule Take 400 mg by mouth 2 (two) times daily.     [name] HUMALOG KWIKPEN 100 UNIT/ML SOPN Inject 24 Units into the skin 3 (three) times daily.     [name] ibuprofen (ADVIL,MOTRIN) 200 MG tablet Take 400 mg by mouth every 6 (six) hours as needed  for headache or moderate pain.    [name] ketotifen (ZADITOR) 0.025 % ophthalmic solution Place 2 drops as needed into both eyes (for allergies).     . metoprolol tartrate (LOPRESSOR) 50 MG tablet Take 1 tablet (50 mg total) by mouth 2 (two) times daily. 180 tablet 3  . montelukast (SINGULAIR) 10 MG tablet Take 1 tablet (10 mg total) by mouth at bedtime. 30 tablet 2  . nitroGLYCERIN (NITROSTAT) 0.4 MG SL tablet Place 1 tablet (0.4 mg total) under the tongue every 5 (five) minutes as needed for chest pain (chest pain). 25 tablet prn  . omeprazole (PRILOSEC) 40 MG capsule Take 40 mg by mouth 2 (two) times daily.    [name] oxybutynin (DITROPAN XL) 15 MG 24 hr tablet Take 15 mg by mouth at bedtime.     . potassium chloride SA (K-DUR,KLOR-CON) 20 MEQ tablet Take 1 tablet (20 mEq total) by mouth daily. 90 tablet 3  . pravastatin (PRAVACHOL) 40 MG tablet Take 40 mg by mouth daily.  2  . PROAIR HFA 108 (90 Base) MCG/ACT inhaler INHALE 2-4 PUFFS UP TO 4 TIMES DAILY AS NEEDED FOR WHEEZING  3  . Respiratory Therapy Supplies (FLUTTER) DEVI Use as directed 1 each 0  . tamsulosin (FLOMAX) 0.4 MG CAPS capsule Take 0.4 mg by mouth daily.    . traMADol (ULTRAM) 50 MG tablet Take 1 tablet (50 mg total) 3 (three) times daily as needed by mouth for moderate pain. 30 tablet 4  . TRESIBA FLEXTOUCH 200 UNIT/ML SOPN Inject 46 Units into the skin daily.  6  . triamterene-hydrochlorothiazide  (MAXZIDE-25) 37.5-25 MG per tablet Take 1 tablet by mouth daily.     . Vitamin D, Ergocalciferol, (DRISDOL) 50000 units CAPS capsule Take 50,000 Units by mouth every Wednesday.   3  . Fluticasone-Umeclidin-Vilant (TRELEGY ELLIPTA) 100-62.5-25 MCG/INH AEPB Inhale 1 puff into the lungs daily. (Patient not taking: Reported on 12/12/2017) 1 each 0   No facility-administered medications prior to visit.     Review of Systems  Review of Systems  HENT: Positive for congestion.   Respiratory: Positive for cough and shortness of breath. Negative for chest tightness and wheezing.   Cardiovascular: Positive for leg swelling.    Physical Exam  BP 124/70 (BP Location: Left Arm, Cuff Size: Normal)   Pulse 71   Temp 97.9 F (36.6 C)   Ht 5\' 6"  (1.676 m)   SpO2 98%   BMI 38.25 kg/m  Physical Exam Constitutional:      Appearance: Normal appearance. He is obese.  HENT:     Head: Normocephalic and atraumatic.     Comments: Thick neck  Mallampati class III    Mouth/Throat:     Mouth: Mucous membranes are dry.  Neck:     Musculoskeletal: Normal range of motion and neck supple.  Cardiovascular:     Rate and Rhythm: Normal rate and regular rhythm.  Pulmonary:     Effort: Pulmonary effort is normal.     Breath sounds: No wheezing.     Comments: Faint crackles/rales right lower base  No wheezing Neurological:     Mental Status: He is alert.      Lab Results:  CBC    Component Value Date/Time   WBC 8.9 11/19/2017 1235   RBC 4.27 11/19/2017 1235   HGB 13.4 11/19/2017 1235   HGB 13.1 08/22/2017 1159   HCT 40.4 11/19/2017 1235   HCT 39.0 08/22/2017 1159   PLT 171.0 11/19/2017 1235  PLT 223 08/22/2017 1159   MCV 94.6 11/19/2017 1235   MCV 91 08/22/2017 1159   MCH 30.7 08/22/2017 1159   MCH 30.9 11/06/2016 0756   MCHC 33.2 11/19/2017 1235   RDW 14.1 11/19/2017 1235   RDW 13.3 08/22/2017 1159   LYMPHSABS 1.5 11/19/2017 1235   MONOABS 0.8 11/19/2017 1235   EOSABS 0.1 11/19/2017  1235   BASOSABS 0.1 11/19/2017 1235    BMET    Component Value Date/Time   NA 139 12/12/2017 1142   NA 138 10/03/2017 1127   K 3.8 12/12/2017 1142   CL 99 12/12/2017 1142   CO2 34 (H) 12/12/2017 1142   GLUCOSE 177 (H) 12/12/2017 1142   BUN 28 (H) 12/12/2017 1142   BUN 30 (H) 10/03/2017 1127   CREATININE 1.33 12/12/2017 1142   CALCIUM 9.3 12/12/2017 1142   GFRNONAA 56 (L) 10/03/2017 1127   GFRAA 65 10/03/2017 1127    BNP No results found for: BNP  ProBNP    Component Value Date/Time   PROBNP 70.0 12/12/2017 1142    Imaging: Dg Chest 2 View  Result Date: 11/14/2017 CLINICAL DATA:  Cough and congestion. EXAM: CHEST - 2 VIEW COMPARISON:  Chest x-ray 09/07/2010. FINDINGS: Mediastinum and hilar structures normal are stable. Slight prominence of the mediastinum most likely prominent great vessels. This finding is stable. Prior CABG. Cardiomegaly. No pulmonary venous congestion. No focal infiltrate. No pleural effusion or pneumothorax. Degenerative change thoracic spine. Prior cervical spine fusion. IMPRESSION: 1.  Prior CABG.  Cardiomegaly.  No pulmonary venous congestion. 2.  No focal infiltrate.  Chest is stable from prior exam. Electronically Signed   By: Marcello Moores  Register   On: 11/14/2017 15:55     Assessment & Plan:   COPD (chronic obstructive pulmonary disease) Continues to have moderate dyspnea with exertion and congested cough Needs repeat PFTS with DLCO Re-trial Trelegy 1 puff daily Continue flutter valve three times a day Labs today showed normal BNP Use Albuterol nebulizer every 4-6 hours for sob or wheezing  Continue to work on diet and exercise  Follow up in 6-8 weeks with Dr. Lake Bells    Chronic cough - Reports upper airway congestion, diff getting mucus up  - Mucinex with no improvement - Continue flutter valve - GERD treated with PPI and follows with GI  - Seen by ENT in 2014, edema felt secondary to reflux disease. Advised follow up.   Dyspnea on  exertion - Echocardiogram 08/27/17 with EF 55-60%, G1DD, PA pressure normal     Martyn Ehrich, NP 12/13/2017

## 2017-12-13 ENCOUNTER — Encounter: Payer: Self-pay | Admitting: Primary Care

## 2017-12-13 NOTE — Assessment & Plan Note (Signed)
-   Reports upper airway congestion, diff getting mucus up  - Mucinex with no improvement - Continue flutter valve - GERD treated with PPI and follows with GI  - Seen by ENT in 2014, edema felt secondary to reflux disease. Advised follow up.

## 2017-12-13 NOTE — Assessment & Plan Note (Signed)
-   Echocardiogram 08/27/17 with EF 55-60%, G1DD, PA pressure normal

## 2017-12-13 NOTE — Assessment & Plan Note (Addendum)
Continues to have moderate dyspnea with exertion and congested cough Spirometry 11/04/17- FVC 1.7 (48%), FEV1 1.3(48%), ratio 48 Needs repeat PFTS with DLCO Re-trial Trelegy 1 puff daily Continue flutter valve three times a day Labs today showed normal BNP Use Albuterol nebulizer every 4-6 hours for sob or wheezing  Continue to work on diet and exercise  Follow up in 6-8 weeks with Dr. Lake Bells

## 2017-12-15 NOTE — Progress Notes (Signed)
Reviewed, agree 

## 2017-12-19 ENCOUNTER — Encounter: Payer: Self-pay | Admitting: Podiatry

## 2017-12-19 ENCOUNTER — Ambulatory Visit: Payer: Medicare Other | Admitting: Podiatry

## 2017-12-19 DIAGNOSIS — B351 Tinea unguium: Secondary | ICD-10-CM | POA: Diagnosis not present

## 2017-12-19 DIAGNOSIS — M79676 Pain in unspecified toe(s): Secondary | ICD-10-CM | POA: Diagnosis not present

## 2017-12-19 DIAGNOSIS — D689 Coagulation defect, unspecified: Secondary | ICD-10-CM | POA: Diagnosis not present

## 2017-12-19 DIAGNOSIS — E1151 Type 2 diabetes mellitus with diabetic peripheral angiopathy without gangrene: Secondary | ICD-10-CM | POA: Diagnosis not present

## 2017-12-19 NOTE — Progress Notes (Signed)
He presents today chief complaint of painful elongated toenails.  Objective: Toenails are long thick yellow dystrophic-like mycotic painful palpation.  Assessment: Pain in limb secondary to onychomycosis.  Plan: Debridement of toenails 1 through 5 bilateral.

## 2017-12-23 DIAGNOSIS — R221 Localized swelling, mass and lump, neck: Secondary | ICD-10-CM | POA: Insufficient documentation

## 2018-01-07 ENCOUNTER — Ambulatory Visit: Payer: Medicare Other | Admitting: Gastroenterology

## 2018-01-07 ENCOUNTER — Encounter: Payer: Self-pay | Admitting: Gastroenterology

## 2018-01-07 VITALS — BP 120/64 | HR 80

## 2018-01-07 DIAGNOSIS — K648 Other hemorrhoids: Secondary | ICD-10-CM | POA: Diagnosis not present

## 2018-01-07 NOTE — Progress Notes (Signed)
PROCEDURE NOTE: The patient presents with symptomatic grade 2 hemorrhoids, requesting rubber band ligation of his/her hemorrhoidal disease. All risks, benefits and alternative forms of therapy were described and informed consent was obtained.  DRE revealed: normal   The anorectum was pre-medicated with 0.125% NTG and lubricant. The decision was made to band the LL internal hemorrhoids, and the [place] was used to perform band ligation without complication. Digital anorectal examination was then performed to assure proper positioning of the band, and to adjust the banded tissue as required. The patient was discharged home without pain or other issues. Dietary and behavioral recommendations were given and along with follow-up instructions.   The following adjunctive treatments were recommended:  none  The patient will return as needed  for follow-up.  Call if ongoing bleeding, and would need referral to CR surgery No complications were encountered and the patient tolerated the procedure well.

## 2018-01-07 NOTE — Patient Instructions (Signed)
If you are age 68 or older, your body mass index should be between 23-30. Your There is no height or weight on file to calculate BMI. If this is out of the aforementioned range listed, please consider follow up with your Primary Care Provider.  If you are age 46 or younger, your body mass index should be between 19-25. Your There is no height or weight on file to calculate BMI. If this is out of the aformentioned range listed, please consider follow up with your Primary Care Provider.   HEMORRHOID BANDING PROCEDURE    FOLLOW-UP CARE   1. The procedure you have had should have been relatively painless since the banding of the area involved does not have nerve endings and there is no pain sensation.  The rubber band cuts off the blood supply to the hemorrhoid and the band may fall off as soon as 48 hours after the banding (the band may occasionally be seen in the toilet bowl following a bowel movement). You may notice a temporary feeling of fullness in the rectum which should respond adequately to plain Tylenol or Motrin.  2. Following the banding, avoid strenuous exercise that evening and resume full activity the next day.  A sitz bath (soaking in a warm tub) or bidet is soothing, and can be useful for cleansing the area after bowel movements.     3. To avoid constipation, take two tablespoons of natural wheat bran, natural oat bran, flax, Benefiber or any over the counter fiber supplement and increase your water intake to 7-8 glasses daily.    4. Unless you have been prescribed anorectal medication, do not put anything inside your rectum for two weeks: No suppositories, enemas, fingers, etc.  5. Occasionally, you may have more bleeding than usual after the banding procedure.  This is often from the untreated hemorrhoids rather than the treated one.  Don't be concerned if there is a tablespoon or so of blood.  If there is more blood than this, lie flat with your bottom higher than your head and  apply an ice pack to the area. If the bleeding does not stop within a half an hour or if you feel faint, call our office at [phone] or go to the emergency room.  6. Problems are not common; however, if there is a substantial amount of bleeding, severe pain, chills, fever or difficulty passing urine (very rare) or other problems, you should call us at [phone] or report to the nearest emergency room.  7. Do not stay seated continuously for more than 2-3 hours for a day or two after the procedure.  Tighten your buttock muscles 10-15 times every two hours and take 10-15 deep breaths every 1-2 hours.  Do not spend more than a few minutes on the toilet if you cannot empty your bowel; instead re-visit the toilet at a later time.   It was a pleasure to see you today!  Dr. Loletha Carrow

## 2018-01-08 ENCOUNTER — Telehealth: Payer: Self-pay | Admitting: Cardiology

## 2018-01-08 NOTE — Telephone Encounter (Signed)
Agree with recommendation for pulmonary evaluation.  Lets see how they treat him.  No other new cardiac recommendations at this time. [name], MD

## 2018-01-08 NOTE — Telephone Encounter (Signed)
Spoke with the patient, he was calling for an appointment with Dr. [name]. I scheduled his 6 month f/u on 05/06/18. He was SOB on the phone so they sent him to triage. He is chronically SOB and is being treated by pulmonology. They recently changed his medication and he does not feel like it was effective. Advised the patient to contact pulmonology to let there office know. He recently was seen at PCP on 12/12 and had ProBNP that was normal. He stated he occasionally feels chest tightness, but denies chest pain. Sending to Dr. [name] for recommendations.

## 2018-01-08 NOTE — Telephone Encounter (Signed)
New message      Pt c/o Shortness Of Breath: STAT if SOB developed within the last 24 hours or pt is noticeably SOB on the phone  1. Are you currently SOB (can you hear that pt is SOB on the phone)? Yes   2. How long have you been experiencing SOB? 4 months ago   3. Are you SOB when sitting or when up moving around? Both   4. Are you currently experiencing any other symptoms? Tired

## 2018-01-10 ENCOUNTER — Encounter: Payer: Self-pay | Admitting: Primary Care

## 2018-01-10 ENCOUNTER — Ambulatory Visit (HOSPITAL_COMMUNITY)
Admission: RE | Admit: 2018-01-10 | Discharge: 2018-01-10 | Disposition: A | Discharge status: Home / Self Care | Payer: Medicare Other | Source: Ambulatory Visit | Attending: Primary Care | Admitting: Primary Care

## 2018-01-10 ENCOUNTER — Ambulatory Visit: Payer: Medicare Other | Admitting: Primary Care

## 2018-01-10 VITALS — BP 124/58 | HR 90 | Temp 98.2°F | Ht 66.0 in | Wt 237.0 lb

## 2018-01-10 DIAGNOSIS — R0781 Pleurodynia: Secondary | ICD-10-CM | POA: Insufficient documentation

## 2018-01-10 DIAGNOSIS — R0789 Other chest pain: Secondary | ICD-10-CM

## 2018-01-10 DIAGNOSIS — R0609 Other forms of dyspnea: Secondary | ICD-10-CM

## 2018-01-10 DIAGNOSIS — J449 Chronic obstructive pulmonary disease, unspecified: Secondary | ICD-10-CM | POA: Diagnosis not present

## 2018-01-10 MED ORDER — IOPAMIDOL (ISOVUE-370) INJECTION 76%
100.0000 mL | Freq: Once | INTRAVENOUS | Status: AC | PRN
  Administered 2018-01-10: 100 mL via INTRAVENOUS

## 2018-01-10 MED ORDER — SODIUM CHLORIDE (PF) 0.9 % IJ SOLN
INTRAMUSCULAR | Status: AC
  Filled 2018-01-10: qty 50

## 2018-01-10 MED ORDER — IOPAMIDOL (ISOVUE-370) INJECTION 76%
INTRAVENOUS | Status: AC
  Filled 2018-01-10: qty 100

## 2018-01-10 NOTE — Addendum Note (Signed)
Addended by: Karmen Stabs on: 01/10/2018 01:54 PM   Modules accepted: Orders

## 2018-01-10 NOTE — Assessment & Plan Note (Addendum)
-   Right pleuritic pain, sore to touch  - O2 96% RA at rest - Ambulatory O2 low 81%, likely from severe deconditioning d/t WC bound - CTA showed no evidence of acute pulmonary embolism, new 79mm low-density liver lesion to right lobe, will refer to GI/liver specialist. Will likely need MRI to follow-up.

## 2018-01-10 NOTE — Assessment & Plan Note (Addendum)
-   Needs full PFTs  - CTA showed no evidence of PE, pneumothorax or pleural effusion. Minimal dependent atelectasis in the lungs.

## 2018-01-10 NOTE — Progress Notes (Addendum)
@Patient  ID: [name], male    DOB: [birth year], 68 y.o.   MRN: [id number]  Chief Complaint  Patient presents with  . Acute Visit    Increase SOB, right side chest pain across rib cage    Referring provider: [name], MD  HPI: 68 year old male, former smoker quit in 2011. PMH COPD, allergic rhinitis, chronic cough. Patient of Dr. Lake Bells, last seen 11/04/17. Previously seen by Dr. Gwenette Greet back in 2014.   Previous [place] Encounters: 11/15/2019Volanda Napoleon, NP Patient presents today for 2 week follow-up visit. During last visit he was treated for COPD exacerbation with Doxycyline and Prednisone taper. Started on Trelegy and given Albuterol neb treatments. He is feeling better than he was, continues taking Trelegy inhaler every day. Hasn't seen a different yet with new inhaler but also knows he hasn't used it long enough to tell. Continues to have some nasal and chest congestion. Having difficulty getting mucus up. Congestion is worse late in the afternoon and at night. Taking Mucinex with no improvement, feels dry. Take lasix 40mg  daily. Feels its working. Continues Singulair and nasal spray. Patient spoke with [employer] and he should be getting nebulizer medication today. Used rescue inhaler this morning.   12/13/2019Volanda Napoleon, NP Patient presents today for 4-6 week follow-up. He was given flutter valve during last office visit and was awaiting albuterol nebulizer. BNP was elevated at 149, he takes lasix 40mg  daily and feels it's working. Advised pt to follow-up with PCP/cardiology for medication management. Complains of continued shortness of breath with exertion but reports not as bad as he was. States that by the time he gets out of his chair to walk a short distance he becomes sob. He also complains of dyspnea with showering. He was given sample of Trelegy,  states that he only tried it for a short period of time and was not sure if it made a difference. Contionues with Anoro and prn  Albuterol which helps some. Associated dry cough with upper airway congestion that he is unable to get up. Took mucinex with no improvement. States that he has been using his flutter valve 2-3 times a day. He had recent leg swelling a week ago and his lasix was increased to 2 tabs daily. Follows with Dr. Loletha Carrow from GI, takes omeprazole twice daily.  D-dimer negative. BNP 70 (149).  01/10/2018 Patient presents today for acute visit with complaints of shortness of breath and right pleuritic pain. Hx Hep C. Stopped drinking 20 years ago, heavy prior use. CT abdomen in 2018 showed normal liver. Continues Trelegy inhaler, 1 puff daily. States albuterol nebulizer helps. Essentially wheelchair bound for the last 15 years d/t spinal nerve injury and multiple back surgeries/ resection spinal tumor. He only walks short distances to bathroom. No wheezing or productive cough. Ambulatory O2 low 80-81%.    TESTING: >> Normal [place] CT >> Allergy skin testing neg >> Spirometry 11/04/17- FVC 1.7 (48%), FEV1 1.3(48%), ratio 48 >> Echocardiogram 08/27/17- EF 55-60%, G1DD, PA pressure normal >> CXR- prior CABG, cardiomegly. No pulmonary congestion. No focal infiltrate.  Allergies  Allergen Reactions  . Codeine Nausea And Vomiting and Other (See Comments)    Severe stomach cramps  . Lisinopril Cough  . Ramipril Cough  . Tape Other (See Comments) and Tinitus    Only plastic tape--can use paper tape OK. Blisters    Immunization History  Administered Date(s) Administered  . Influenza Split 09/03/2011  . Influenza, High Dose Seasonal PF 10/09/2016, 11/14/2017  .  Pneumococcal Conjugate-13 12/03/2011  . Pneumococcal Polysaccharide-23 11/06/2016    Past Medical History:  Diagnosis Date  . Bilateral lower extremity edema   . CAD (coronary artery disease) cardiologist-  dr [name]   a. Remote MI with stent in 2005;  b. CABG x 5 in 2005;  c. 09/2010 Cath: LM nl, LAD 70p, 90-58m, D1 80p, LCX 45m, OM1 100, OM2 90ost,  OM3 80, RCA 52m, VG->PDA->RPL nl, VG->Diag nl, VG->OM1 nl, LIMA->LAD nl, EF 60%-->Med Rx.  . Chronic venous insufficiency    post vein harvest for CABG  . COPD (chronic obstructive pulmonary disease) ([place])   . Diabetes mellitus type 2, controlled ([place])   . Difficult intravenous access 06-16-2015   multiple attempt IV starts until use of scanner  . ED (erectile dysfunction) of organic origin   . Foot drop, bilateral   . GERD (gastroesophageal reflux disease)   . H/O spinal cord injury    a. more or less wheelchair bound. post mulitple back surgery's including resection spinal tumor  . History of acute myocardial infarction of inferior wall    02/ 2005 inferoposterior  w/ stenting to RCA  . 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 History of cellulitis    left lower leg-- now resolved  . History of ulcer of lower limb    venous statis  ---  resolved  . Hypertension   . Increased liver enzymes    due to alcohol use  . Left rotator cuff tear   . Myocardial infarction [facility]) 2005   with stent placement  . Nocturia   . OA (osteoarthritis)    left shoulder  . OAB (overactive bladder)   . OSA on CPAP   . PAD (peripheral artery disease) ([place])    followed by Dr. [name]  . Peripheral arterial occlusive disease ([place])    followed by dr fields  . 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 Type 2 diabetes mellitus ([place])   . Urinary incontinence, urge   . Wheelchair bound     Tobacco History: Social History   Tobacco Use  Smoking Status Former Smoker  . Packs/day: 0.50  . Years: 40.00  . Pack years: 20.00  . Types: Cigarettes  . Last attempt to quit: 01/01/2009  . Years since quitting: 9.0  Smokeless Tobacco Never Used  Tobacco Comment   as of 06-09-2015 per pt last month lite smoker   Counseling given: Not Answered Comment: as of 06-09-2015 per pt last month lite smoker   Outpatient Medications Prior to Visit  Medication  Sig Dispense Refill  . albuterol (PROVENTIL) (2.5 MG/3ML) 0.083% nebulizer solution Take 3 mLs (2.5 mg total) by nebulization every 6 (six) hours and as needed for wheezing or shortness of breath. 120 mL 11  . aspirin 81 MG tablet Take 81 mg by mouth at bedtime.     . cetirizine (ZYRTEC) 10 MG tablet Take 10 mg by mouth daily.    . clopidogrel (PLAVIX) 75 MG tablet Take 75 mg by mouth daily.  12  . Docusate Calcium (STOOL SOFTENER PO) Take 100 mg by mouth every evening.     [name] doxazosin (CARDURA) 2 MG tablet TAKE 1 TABLET (2 MG TOTAL) BY MOUTH AT BEDTIME. 30 tablet 12  . fluticasone (FLONASE) 50 MCG/ACT nasal spray Place 1 spray into both nostrils at bedtime as needed for allergies.     . Fluticasone-Umeclidin-Vilant (  TRELEGY ELLIPTA) 100-62.5-25 MCG/INH AEPB Inhale 1 puff into the lungs daily. 1 each 0  . Fluticasone-Umeclidin-Vilant (TRELEGY ELLIPTA) 100-62.5-25 MCG/INH AEPB Inhale 1 puff into the lungs daily. 60 each 1  . gabapentin (NEURONTIN) 400 MG capsule Take 400 mg by mouth 2 (two) times daily.     [name] HUMALOG KWIKPEN 100 UNIT/ML SOPN Inject 24 Units into the skin 3 (three) times daily.     [name] ibuprofen (ADVIL,MOTRIN) 200 MG tablet Take 400 mg by mouth every 6 (six) hours as needed for headache or moderate pain.    [name] ipratropium (ATROVENT) 0.06 % nasal spray     . ketotifen (ZADITOR) 0.025 % ophthalmic solution Place 2 drops as needed into both eyes (for allergies).     . metoprolol tartrate (LOPRESSOR) 50 MG tablet Take 1 tablet (50 mg total) by mouth 2 (two) times daily. 180 tablet 3  . montelukast (SINGULAIR) 10 MG tablet Take 1 tablet (10 mg total) by mouth at bedtime. 30 tablet 2  . nitroGLYCERIN (NITROSTAT) 0.4 MG SL tablet Place 1 tablet (0.4 mg total) under the tongue every 5 (five) minutes as needed for chest pain (chest pain). 25 tablet prn  . NOVOFINE 32G X 6 MM MISC USE ONCE DAILY WITH LEVEMIR PEN  1  . omeprazole (PRILOSEC) 40 MG capsule Take 40 mg by mouth 2 (two) times daily.     [name] VERIO test strip USE TO SELF MONITOR BLOOD GLUCOSE UP TO 4 TIMES DAILY (E11.65)  4  . oxybutynin (DITROPAN XL) 15 MG 24 hr tablet Take 15 mg by mouth at bedtime.     . potassium chloride SA (K-DUR,KLOR-CON) 20 MEQ tablet Take 1 tablet (20 mEq total) by mouth daily. 90 tablet 3  . pravastatin (PRAVACHOL) 40 MG tablet Take 40 mg by mouth daily.  2  . PROAIR HFA 108 (90 Base) MCG/ACT inhaler INHALE 2-4 PUFFS UP TO 4 TIMES DAILY AS NEEDED FOR WHEEZING  3  . Respiratory Therapy Supplies (FLUTTER) DEVI Use as directed 1 each 0  . tamsulosin (FLOMAX) 0.4 MG CAPS capsule Take 0.4 mg by mouth daily.    . traMADol (ULTRAM) 50 MG tablet Take 1 tablet (50 mg total) 3 (three) times daily as needed by mouth for moderate pain. 30 tablet 4  . TRESIBA FLEXTOUCH 200 UNIT/ML SOPN Inject 46 Units into the skin daily.  6  . triamterene-hydrochlorothiazide (MAXZIDE-25) 37.5-25 MG per tablet Take 1 tablet by mouth daily.     . Vitamin D, Ergocalciferol, (DRISDOL) 50000 units CAPS capsule Take 50,000 Units by mouth every Wednesday.   3  . furosemide (LASIX) 40 MG tablet Take 1 tablet (40 mg total) by mouth daily. 90 tablet 3   No facility-administered medications prior to visit.     Review of Systems  Review of Systems  Constitutional: Negative.   HENT:       Chronic nasal congestion   Respiratory: Positive for shortness of breath. Negative for cough and wheezing.        Right pleuritic pain   Cardiovascular: Negative.     Physical Exam  BP (!) 124/58 (BP Location: Left Arm, Cuff Size: Large)   Pulse 90   Temp 98.2 F (36.8 C)   Ht 5\' 6"  (1.676 m)   Wt 237 lb (107.5 kg)   SpO2 96%   BMI 38.25 kg/m  Physical Exam Constitutional:      General: He is not in acute distress.    Appearance: He is obese.  He is not ill-appearing.  HENT:     Head: Normocephalic and atraumatic.     Right Ear: Tympanic membrane normal.     Left Ear: Tympanic membrane normal.     Ears:     Comments: Cerumen  impaction left     Nose: Nose normal.     Mouth/Throat:     Mouth: Mucous membranes are moist.     Pharynx: Oropharynx is clear.     Comments: Mallampati class III Eyes:     Extraocular Movements: Extraocular movements intact.     Conjunctiva/sclera: Conjunctivae normal.     Pupils: Pupils are equal, round, and reactive to light.  Neck:     Musculoskeletal: Normal range of motion and neck supple.     Comments: Thick Cardiovascular:     Rate and Rhythm: Normal rate and regular rhythm.     Comments: RRR Pulmonary:     Effort: Pulmonary effort is normal. No respiratory distress.     Breath sounds: No wheezing.     Comments: Mostly clear, ?crackles bases Musculoskeletal:     Comments: In WC  Skin:    General: Skin is warm and dry.  Neurological:     General: No focal deficit present.     Mental Status: He is alert and oriented to person, place, and time. Mental status is at baseline.  Psychiatric:        Mood and Affect: Mood normal.        Behavior: Behavior normal.        Thought Content: Thought content normal.        Judgment: Judgment normal.      Lab Results:  CBC    Component Value Date/Time   WBC 8.9 11/19/2017 1235   RBC 4.27 11/19/2017 1235   HGB 13.4 11/19/2017 1235   HGB 13.1 08/22/2017 1159   HCT 40.4 11/19/2017 1235   HCT 39.0 08/22/2017 1159   PLT 171.0 11/19/2017 1235   PLT 223 08/22/2017 1159   MCV 94.6 11/19/2017 1235   MCV 91 08/22/2017 1159   MCH 30.7 08/22/2017 1159   MCH 30.9 11/06/2016 0756   MCHC 33.2 11/19/2017 1235   RDW 14.1 11/19/2017 1235   RDW 13.3 08/22/2017 1159   LYMPHSABS 1.5 11/19/2017 1235   MONOABS 0.8 11/19/2017 1235   EOSABS 0.1 11/19/2017 1235   BASOSABS 0.1 11/19/2017 1235    BMET    Component Value Date/Time   NA 139 12/12/2017 1142   NA 138 10/03/2017 1127   K 3.8 12/12/2017 1142   CL 99 12/12/2017 1142   CO2 34 (H) 12/12/2017 1142   GLUCOSE 177 (H) 12/12/2017 1142   BUN 28 (H) 12/12/2017 1142   BUN 30 (H)  10/03/2017 1127   CREATININE 1.33 12/12/2017 1142   CALCIUM 9.3 12/12/2017 1142   GFRNONAA 56 (L) 10/03/2017 1127   GFRAA 65 10/03/2017 1127    BNP No results found for: BNP  ProBNP    Component Value Date/Time   PROBNP 70.0 12/12/2017 1142    Imaging: Ct Angio Chest W/cm &/or Wo Cm  Result Date: 01/10/2018 CLINICAL DATA:  Chest pain and short of breath for 3 days EXAM: CT ANGIOGRAPHY CHEST WITH CONTRAST TECHNIQUE: Multidetector CT imaging of the chest was performed using the standard protocol during bolus administration of intravenous contrast. Multiplanar CT image reconstructions and MIPs were obtained to evaluate the vascular anatomy. CONTRAST:  121mL ISOVUE-370 IOPAMIDOL (ISOVUE-370) INJECTION 76% COMPARISON:  None. FINDINGS: Cardiovascular: There are no  filling defects in the pulmonary arterial tree to suggest acute pulmonary thromboembolism. 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 Postoperative changes from CABG are noted. Moderate 3 vessel coronary artery calcifications. Atherosclerotic vascular calcifications of the aorta and great vessels are noted. Mediastinum/Nodes: No abnormal mediastinal adenopathy. No pericardial effusion. Lungs/Pleura: No pneumothorax or pleural effusion. Minimal dependent atelectasis in the lungs. Upper Abdomen: New 9 mm low-density lesion in the right lobe of the liver on image 97. Postcholecystectomy. Musculoskeletal: No vertebral compression deformity. Review of the MIP images confirms the above findings. IMPRESSION: No evidence of acute pulmonary thromboembolism. 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 This can be associated with congenital cardiac anomalies. New 9 mm low-density lesion in the liver. If there is a history of malignancy or risk of malignancy, six-month follow-up MRI is recommended. Aortic Atherosclerosis (ICD10-I70.0). Electronically Signed   By: Marybelle Killings M.D.   On: 01/10/2018 13:14     Assessment &  Plan:   68 year old male, former smoker quit 2011. PMH significant for COPD. Complains of ongoing shortness of breath over the last few months. Associated new right pleuritic pain, sore to touch. D-dimer was negative in December. Ambulatory O2 low today was 81%, likely from severe deconditioning d/t being wheelchair bound. Plan to obtain CTA today to r/o PE or other causes of dyspnea. He continues Trelegy inhaler 1 puff daily and reports improvement with as needed Albuterol nebulizer's. Needs to be scheduled for full PFTs.   Pleuritic pain - Right pleuritic pain, sore to touch  - O2 96% RA at rest - Ambulatory O2 low 81%, likely from severe deconditioning d/t WC bound - CTA showed no evidence of acute pulmonary embolism, new 80mm low-density liver lesion to right lobe, will refer to GI/liver specialist. Will likely need MRI to follow-up.   Dyspnea on exertion - Needs full PFTs  - CTA showed no evidence of PE, pneumothorax or pleural effusion. Minimal dependent atelectasis in the lungs.   Martyn Ehrich, NP 01/10/2018

## 2018-01-10 NOTE — Progress Notes (Signed)
Reviewed, agree 

## 2018-01-10 NOTE — Patient Instructions (Addendum)
Orders: Stat CTA re: right pleuritic pain and oxygen desaturation Ambulatory referral for new oxygen - please wear 2L on exertion   Recommendations: Continue Trelegy 1 puff daily Use Albuterol nebulizer every 4-6 hours for shortness of breath or wheezing   Follow up: Dr. Lake Bells 2/26 at 10:30 (can we see if he can get an earlier appointment with him) Needs PFTs prior

## 2018-02-02 ENCOUNTER — Other Ambulatory Visit: Payer: Self-pay | Admitting: Pulmonary Disease

## 2018-02-24 ENCOUNTER — Ambulatory Visit (INDEPENDENT_AMBULATORY_CARE_PROVIDER_SITE_OTHER): Payer: Medicare Other | Admitting: Pulmonary Disease

## 2018-02-24 DIAGNOSIS — R0609 Other forms of dyspnea: Secondary | ICD-10-CM | POA: Diagnosis not present

## 2018-02-24 DIAGNOSIS — J449 Chronic obstructive pulmonary disease, unspecified: Secondary | ICD-10-CM

## 2018-02-24 LAB — PULMONARY FUNCTION TEST
DL/VA % pred: 103 %
DL/VA: 4.33 ml/min/mmHg/L
DLCO unc % pred: 71 %
DLCO unc: 16.63 ml/min/mmHg
FEF 25-75 Post: 1.42 L/sec
FEF 25-75 Pre: 1.43 L/sec
FEF2575-%Change-Post: 0 %
FEF2575-%Pred-Post: 62 %
FEF2575-%Pred-Pre: 63 %
FEV1-%Change-Post: -1 %
FEV1-%Pred-Post: 50 %
FEV1-%Pred-Pre: 51 %
FEV1-Post: 1.46 L
FEV1-Pre: 1.48 L
FEV1FVC-%Change-Post: 1 %
FEV1FVC-%Pred-Pre: 108 %
FEV6-%Change-Post: -3 %
FEV6-%Pred-Post: 48 %
FEV6-%Pred-Pre: 50 %
FEV6-Post: 1.78 L
FEV6-Pre: 1.84 L
FEV6FVC-%Change-Post: 0 %
FEV6FVC-%PRED-PRE: 106 %
FEV6FVC-%Pred-Post: 105 %
FVC-%Change-Post: -2 %
FVC-%PRED-POST: 46 %
FVC-%Pred-Pre: 47 %
FVC-Post: 1.79 L
FVC-Pre: 1.84 L
Post FEV1/FVC ratio: 81 %
Post FEV6/FVC ratio: 99 %
Pre FEV1/FVC ratio: 80 %
Pre FEV6/FVC Ratio: 100 %
RV % PRED: 177 %
RV: 3.84 L
TLC % pred: 98 %
TLC: 6.12 L

## 2018-02-24 NOTE — Progress Notes (Signed)
Full PFT performed today. °

## 2018-02-26 ENCOUNTER — Encounter: Payer: Self-pay | Admitting: Pulmonary Disease

## 2018-02-26 ENCOUNTER — Ambulatory Visit: Payer: Medicare Other | Admitting: Pulmonary Disease

## 2018-02-26 VITALS — BP 146/50 | HR 80 | Ht 66.0 in | Wt 237.8 lb

## 2018-02-26 DIAGNOSIS — R0609 Other forms of dyspnea: Secondary | ICD-10-CM

## 2018-02-26 DIAGNOSIS — I5032 Chronic diastolic (congestive) heart failure: Secondary | ICD-10-CM | POA: Diagnosis not present

## 2018-02-26 DIAGNOSIS — J449 Chronic obstructive pulmonary disease, unspecified: Secondary | ICD-10-CM | POA: Diagnosis not present

## 2018-02-26 DIAGNOSIS — J441 Chronic obstructive pulmonary disease with (acute) exacerbation: Secondary | ICD-10-CM | POA: Diagnosis not present

## 2018-02-26 DIAGNOSIS — J398 Other specified diseases of upper respiratory tract: Secondary | ICD-10-CM

## 2018-02-26 MED ORDER — SODIUM CHLORIDE 3 % IN NEBU: INHALATION_SOLUTION | RESPIRATORY_TRACT | 3 refills | Status: DC

## 2018-02-26 NOTE — Progress Notes (Signed)
Synopsis: COPD; Referred in November 2019 for cough.  He has a history of CAD, spinal cord injury.  He had bypass surgery.  Subjective:   PATIENT ID: Hunter [name] GENDER: male DOB: 20-Nov-1950, MRN: [id number]   HPI  Chief Complaint  Patient presents with  . Follow-up    had PFT, Shortness of breath with exertion,    Trelegy caused him to have urinary retention. Since the last visit he has been doing OK.  He had some problems with chest congestion after I saw him last but the azithromycin really helps with this.  He says that he stopped taking Trelegy and he has not really noticed any difference with his breathing.  He says that he just struggles when he gets sick.  He has noticed that his oxygen level will drop some when he transfers.  He tries to do some exercise on a stationary bike but he is only able to do it for 1 to 3 minutes at a time based on his hip pain.   Past Medical History:  Diagnosis Date  . Bilateral lower extremity edema   . CAD (coronary artery disease) cardiologist-  dr Hunter [name]   a. Remote MI with stent in 2005;  b. CABG x 5 in 2005;  c. 09/2010 Cath: LM nl, LAD 70p, 90-77m, D1 80p, LCX 3m, OM1 100, OM2 90ost, OM3 80, RCA 24m, VG->PDA->RPL nl, VG->Diag nl, VG->OM1 nl, LIMA->LAD nl, EF 60%-->Med Rx.  . Chronic venous insufficiency    post vein harvest for CABG  . COPD (chronic obstructive pulmonary disease) ([place])   . Diabetes mellitus type 2, controlled ([place])   . Difficult intravenous access 06-16-2015   multiple attempt IV starts until use of scanner  . ED (erectile dysfunction) of organic origin   . Foot drop, bilateral   . GERD (gastroesophageal reflux disease)   . H/O spinal cord injury    a. more or less wheelchair bound. post mulitple back surgery's including resection spinal tumor  . History of acute myocardial infarction of inferior wall    02/ 2005 inferoposterior  w/ stenting to RCA  . 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 History of cellulitis    left lower leg-- now resolved  . History of ulcer of lower limb    venous statis  ---  resolved  . Hypertension   . Increased liver enzymes    due to alcohol use  . Left rotator cuff tear   . Myocardial infarction [facility]) 2005   with stent placement  . Nocturia   . OA (osteoarthritis)    left shoulder  . OAB (overactive bladder)   . OSA on CPAP   . PAD (peripheral artery disease) ([place])    followed by Dr. Oneida [name]  . Peripheral arterial occlusive disease ([place])    followed by dr Hunter [name]  . 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 Type 2 diabetes mellitus ([place])   . Urinary incontinence, urge   . Wheelchair bound          Review of Systems  Constitutional: Negative for fever and weight loss.  HENT: Positive for congestion. Negative for ear pain, nosebleeds and sore throat.   Eyes: Negative for redness.  Respiratory: Positive for cough, shortness of breath and wheezing.   Cardiovascular: Positive for leg swelling. Negative for palpitations and PND.  Gastrointestinal: Negative for nausea and vomiting.  Genitourinary: Positive for dysuria and frequency.  Skin: Negative for rash.  Neurological: Negative for headaches.  Endo/Heme/Allergies: Bruises/bleeds easily.  Psychiatric/Behavioral: Negative for depression. The patient is not nervous/anxious.       Objective:  Physical Exam   Vitals:   02/26/18 1031 02/26/18 1034  BP:  (!) 146/50  Pulse:  80  SpO2:  95%  Weight: 237 lb 12.8 oz (107.9 kg)   Height: 5\' 6"  (1.676 m)   O2 saturation 95%  2L [place]  Gen: chronically ill appearing, in wheelchair, morbidly obese HENT: OP clear, TM's clear, neck supple PULM: CTA B, normal percussion CV: RRR, no mgr, trace edema GI: BS+, soft, nontender Derm: no cyanosis or rash Psyche: normal mood and affect    CBC    Component Value Date/Time   WBC 8.9 11/19/2017 1235   RBC 4.27 11/19/2017 1235   HGB 13.4 11/19/2017 1235     HGB 13.1 08/22/2017 1159   HCT 40.4 11/19/2017 1235   HCT 39.0 08/22/2017 1159   PLT 171.0 11/19/2017 1235   PLT 223 08/22/2017 1159   MCV 94.6 11/19/2017 1235   MCV 91 08/22/2017 1159   MCH 30.7 08/22/2017 1159   MCH 30.9 11/06/2016 0756   MCHC 33.2 11/19/2017 1235   RDW 14.1 11/19/2017 1235   RDW 13.3 08/22/2017 1159   LYMPHSABS 1.5 11/19/2017 1235   MONOABS 0.8 11/19/2017 1235   EOSABS 0.1 11/19/2017 1235   BASOSABS 0.1 11/19/2017 1235     Chest imaging: 01/2018 CT angiogram: no PE, pulmonary parenchyma unremarkable, mild bronchomalacia and tracheomalacia bilaterally> right sided aortic arch, 77mm hypodensity in liver, images personally reviewed  PFT: 2010 pulmonary function test at our office showed a ratio of 69%, FEV1 1.65 L 57% predicted total lung capacity 4.4 L 80% predicted, DLCO 23 mL 60% predicted November 2019 simple spirometry ratio 69%, FEV1 1.3 L 48% predicted, FVC 1.7 L 48% predicted 02/2018 PFT> Ratio 80%, FEV1 1.48L 51% pred, FVC 1.84L (47% pred), TLC 6.12 L (98% pred), RV 177% pred, DLCO 16.63 71% pred  Labs:  Path:  Echo: August 2019 echocardiogram showed normal LVEF, normal RV size and function without evidence of pulmonary hypertension  Heart Catheterization: September 2019 left heart catheterization showed three-vessel disease, patent grafts, pulmonary capillary wedge pressure was 22, mean PA pressure was 28      Assessment & Plan:   Dyspnea on exertion  Chronic obstructive pulmonary disease, unspecified COPD type ([place])  Chronic diastolic heart failure ([place])  COPD with acute exacerbation ([place])  Tracheobronchomalacia  Discussion: In general this is been a stable interval for [name].  He has COPD but he has never really benefited from daily controller medicines.  His syndrome is complicated by significant morbid obesity, chronic diastolic heart failure, and physical deconditioning.  Because of a spinal injury his ability to exercise is  severely limited.  He also has significant tracheobronchomalacia seen on the most recent CT scan of his chest.  This makes it much more difficult for him to clear mucus out when he gets sick and makes [place] need to focus more on mucociliary clearance efforts.  Is not clear to me that he would benefit from a daily inhaled controller medicine, it is never really made much of a difference in the past.  I think we just need to focus on being aggressive about mucociliary clearance efforts, antibiotics, and steroids when he gets sick.  OSA Continue CPAP nightly  COPD For now I do not think there  is any reason for you to take medicines like Anoro or Trelegy Use albuterol as needed for chest tightness wheezing or shortness of breath Practice good hand hygiene Try to stay physically active  Tracheobronchomalacia: This is the medical term that means that your airways are more narrow and floppy than they should be When you get sick they will clog up with mucus and it makes it very difficult for you to cough it out You need to call me whenever you get a case of bronchitis such as increasing chest congestion or mucus production so that I can call in antibiotics for you right away Use the albuterol as needed for chest tightness wheezing or shortness of breath In the event of increasing chest congestion use albuterol, 5 minutes later take the hypertonic saline nebulizer that we prescribed you today, this should help you clear the mucus out In the event of being sick you need to use the flutter valve 4 to 5 breaths, 4-5 times a day  Chronic Diastolic heart failure Continue taking diuretic medicines as you are doing  Physical deconditioning Try to exercise as much as possible, I recommend getting a recumbent bicycle so that it is easier for you to sit and exercise your legs  Chronic respiratory failure with hypoxemia Continue using 2 L of oxygen continuously  Follow-up in 3 to 4 months or sooner if  needed  > 50% of this 25 minute visit spent face to face   Current Outpatient Medications:  .  albuterol (PROVENTIL) (2.5 MG/3ML) 0.083% nebulizer solution, Take 3 mLs (2.5 mg total) by nebulization every 6 (six) hours and as needed for wheezing or shortness of breath., Disp: 120 mL, Rfl: 11 .  aspirin 81 MG tablet, Take 81 mg by mouth at bedtime. , Disp: , Rfl:  .  cetirizine (ZYRTEC) 10 MG tablet, Take 10 mg by mouth daily., Disp: , Rfl:  .  clopidogrel (PLAVIX) 75 MG tablet, Take 75 mg by mouth daily., Disp: , Rfl: 12 .  Docusate Calcium (STOOL SOFTENER PO), Take 100 mg by mouth every evening. , Disp: , Rfl:  .  doxazosin (CARDURA) 2 MG tablet, TAKE 1 TABLET (2 MG TOTAL) BY MOUTH AT BEDTIME., Disp: 30 tablet, Rfl: 12 .  fluticasone (FLONASE) 50 MCG/ACT nasal spray, Place 1 spray into both nostrils at bedtime as needed for allergies. , Disp: , Rfl:  .  gabapentin (NEURONTIN) 400 MG capsule, Take 400 mg by mouth 2 (two) times daily. , Disp: , Rfl:  .  HUMALOG KWIKPEN 100 UNIT/ML SOPN, Inject 24 Units into the skin 3 (three) times daily. , Disp: , Rfl:  .  ibuprofen (ADVIL,MOTRIN) 200 MG tablet, Take 400 mg by mouth every 6 (six) hours as needed for headache or moderate pain., Disp: , Rfl:  .  ipratropium (ATROVENT) 0.06 % nasal spray, , Disp: , Rfl:  .  ketotifen (ZADITOR) 0.025 % ophthalmic solution, Place 2 drops as needed into both eyes (for allergies). , Disp: , Rfl:  .  metoprolol tartrate (LOPRESSOR) 50 MG tablet, Take 1 tablet (50 mg total) by mouth 2 (two) times daily., Disp: 180 tablet, Rfl: 3 .  montelukast (SINGULAIR) 10 MG tablet, TAKE 1 TABLET BY MOUTH EVERYDAY AT BEDTIME, Disp: 30 tablet, Rfl: 2 .  nitroGLYCERIN (NITROSTAT) 0.4 MG SL tablet, Place 1 tablet (0.4 mg total) under the tongue every 5 (five) minutes as needed for chest pain (chest pain)., Disp: 25 tablet, Rfl: prn .  NOVOFINE 32G X 6 MM  MISC, USE ONCE DAILY WITH LEVEMIR PEN, Disp: , Rfl: 1 .  omeprazole (PRILOSEC) 40  MG capsule, Take 40 mg by mouth 2 (two) times daily., Disp: , Rfl:  .  ONETOUCH VERIO test strip, USE TO SELF MONITOR BLOOD GLUCOSE UP TO 4 TIMES DAILY (E11.65), Disp: , Rfl: 4 .  oxybutynin (DITROPAN XL) 15 MG 24 hr tablet, Take 15 mg by mouth at bedtime. , Disp: , Rfl:  .  potassium chloride SA (K-DUR,KLOR-CON) 20 MEQ tablet, Take 1 tablet (20 mEq total) by mouth daily., Disp: 90 tablet, Rfl: 3 .  pravastatin (PRAVACHOL) 40 MG tablet, Take 40 mg by mouth daily., Disp: , Rfl: 2 .  PROAIR HFA 108 (90 Base) MCG/ACT inhaler, INHALE 2-4 PUFFS UP TO 4 TIMES DAILY AS NEEDED FOR WHEEZING, Disp: , Rfl: 3 .  Respiratory Therapy Supplies (FLUTTER) DEVI, Use as directed, Disp: 1 each, Rfl: 0 .  tamsulosin (FLOMAX) 0.4 MG CAPS capsule, Take 0.4 mg by mouth daily., Disp: , Rfl:  .  traMADol (ULTRAM) 50 MG tablet, Take 1 tablet (50 mg total) 3 (three) times daily as needed by mouth for moderate pain., Disp: 30 tablet, Rfl: 4 .  TRESIBA FLEXTOUCH 200 UNIT/ML SOPN, Inject 46 Units into the skin daily., Disp: , Rfl: 6 .  triamterene-hydrochlorothiazide (MAXZIDE-25) 37.5-25 MG per tablet, Take 1 tablet by mouth daily. , Disp: , Rfl:  .  Vitamin D, Ergocalciferol, (DRISDOL) 50000 units CAPS capsule, Take 50,000 Units by mouth every Wednesday. , Disp: , Rfl: 3 .  Fluticasone-Umeclidin-Vilant (TRELEGY ELLIPTA) 100-62.5-25 MCG/INH AEPB, Inhale 1 puff into the lungs daily. (Patient not taking: Reported on 02/26/2018), Disp: 1 each, Rfl: 0 .  Fluticasone-Umeclidin-Vilant (TRELEGY ELLIPTA) 100-62.5-25 MCG/INH AEPB, Inhale 1 puff into the lungs daily. (Patient not taking: Reported on 02/26/2018), Disp: 60 each, Rfl: 1 .  furosemide (LASIX) 40 MG tablet, Take 1 tablet (40 mg total) by mouth daily., Disp: 90 tablet, Rfl: 3 .  sodium chloride HYPERTONIC 3 % nebulizer solution, 4 mL inhaled via nebulizer twice daily., Disp: 240 mL, Rfl: 3

## 2018-02-26 NOTE — Patient Instructions (Signed)
OSA Continue CPAP nightly  COPD For now I do not think there is any reason for you to take medicines like Anoro or Trelegy Use albuterol as needed for chest tightness wheezing or shortness of breath Practice good hand hygiene Try to stay physically active  Tracheobronchomalacia: This is the medical term that means that your airways are more narrow and floppy than they should be When you get sick they will clog up with mucus and it makes it very difficult for you to cough it out You need to call me whenever you get a case of bronchitis such as increasing chest congestion or mucus production so that I can call in antibiotics for you right away Use the albuterol as needed for chest tightness wheezing or shortness of breath In the event of increasing chest congestion use albuterol, 5 minutes later take the hypertonic saline nebulizer that we prescribed you today, this should help you clear the mucus out In the event of being sick you need to use the flutter valve 4 to 5 breaths, 4-5 times a day  Chronic Diastolic heart failure Continue taking diuretic medicines as you are doing  Physical deconditioning Try to exercise as much as possible, I recommend getting a recumbent bicycle so that it is easier for you to sit and exercise your legs  Chronic respiratory failure with hypoxemia Continue using 2 L of oxygen continuously  Follow-up in 3 to 4 months or sooner if needed

## 2018-03-06 ENCOUNTER — Ambulatory Visit: Payer: Medicare Other | Admitting: Cardiology

## 2018-03-06 ENCOUNTER — Encounter: Payer: Self-pay | Admitting: Cardiology

## 2018-03-06 VITALS — BP 140/60 | HR 76 | Ht 66.0 in | Wt 236.0 lb

## 2018-03-06 DIAGNOSIS — I259 Chronic ischemic heart disease, unspecified: Secondary | ICD-10-CM

## 2018-03-06 DIAGNOSIS — I5032 Chronic diastolic (congestive) heart failure: Secondary | ICD-10-CM

## 2018-03-06 DIAGNOSIS — I10 Essential (primary) hypertension: Secondary | ICD-10-CM

## 2018-03-06 DIAGNOSIS — I779 Disorder of arteries and arterioles, unspecified: Secondary | ICD-10-CM
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
## 2018-03-06 NOTE — Patient Instructions (Signed)
Medication Instructions:  The current medical regimen is effective;  continue present plan and medications.  If you need a refill on your cardiac medications before your next appointment, please call your pharmacy.   Follow-Up: At [facility], you and your health needs are our priority.  As part of our continuing mission to provide you with exceptional heart care, we have created designated Provider Care Teams.  These Care Teams include your primary Cardiologist (physician) and Advanced Practice Providers (APPs -  Physician Assistants and Nurse Practitioners) who all work together to provide you with the care you need, when you need it. You will need a follow up appointment in 6 months.  Please call our office 2 months in advance to schedule this appointment.  You may see Mark Skains, MD or one of the following Advanced Practice Providers on your designated Care Team:   Lori Gerhardt, NP Laura Ingold, NP . Jill McDaniel, NP  Thank you for choosing [facility]!!       

## 2018-03-11 ENCOUNTER — Encounter: Payer: Self-pay | Admitting: Internal Medicine

## 2018-03-20 ENCOUNTER — Ambulatory Visit: Payer: Medicare Other | Admitting: Podiatry

## 2018-05-25 ENCOUNTER — Other Ambulatory Visit: Payer: Self-pay | Admitting: Pulmonary Disease

## 2018-07-08 ENCOUNTER — Telehealth: Payer: Self-pay | Admitting: Gastroenterology

## 2018-07-08 NOTE — Telephone Encounter (Signed)
Pt states he is in a wheelchair and is having a lot of pain from his hemorrhoids. Pt states he had banding back in March but is having lots of pain and bleeding from his hemorrhoids. Discussed with pt the first available banding appt is 08/05/18. Pt states he has to have something prior to that date prescribed for the pain. Please advise.

## 2018-07-08 NOTE — Telephone Encounter (Signed)
I do not have any open appointments soon.    At my last banding session with him, I noted that further internal hemorrhoid problems require referral to colo-rectal surgery.  Please send that referral.  Advise recticare ointment in the meantime in case fissure, sitz baths a tucks pads for relief.

## 2018-07-08 NOTE — Telephone Encounter (Signed)
Patient called stating that he is having problems with his hemorrhoids and need to be seen tomorrow or one day this week.

## 2018-07-09 NOTE — Telephone Encounter (Signed)
Spoke with pt and he is aware. Referral faxed to [facility].

## 2018-07-12 ENCOUNTER — Other Ambulatory Visit: Payer: Self-pay | Admitting: Cardiology

## 2018-07-22 ENCOUNTER — Other Ambulatory Visit: Payer: Self-pay

## 2018-07-22 ENCOUNTER — Encounter: Payer: Self-pay | Admitting: Podiatry

## 2018-07-22 ENCOUNTER — Ambulatory Visit: Payer: Medicare Other | Admitting: Podiatry

## 2018-07-22 VITALS — Temp 97.8°F

## 2018-07-22 DIAGNOSIS — E1151 Type 2 diabetes mellitus with diabetic peripheral angiopathy without gangrene: Secondary | ICD-10-CM

## 2018-07-22 DIAGNOSIS — D689 Coagulation defect, unspecified: Secondary | ICD-10-CM

## 2018-07-22 DIAGNOSIS — B351 Tinea unguium: Secondary | ICD-10-CM

## 2018-07-22 DIAGNOSIS — M79676 Pain in unspecified toe(s): Secondary | ICD-10-CM | POA: Diagnosis not present

## 2018-07-22 NOTE — Progress Notes (Signed)
He presents today chief complaint of painfully elongated toenails 1 through 5 bilaterally.  Objective: Pulses remain palpable considerable edema bilateral lower extremity nails 1 through 5 are grossly elongated and coaptated and ingrown.  Assessment: Pain limb secondary to onychomycosis.  Plan: Debridement of toenails 1 through 5 bilaterally.

## 2018-08-19 ENCOUNTER — Other Ambulatory Visit: Payer: Self-pay | Admitting: Cardiology

## 2018-08-27 ENCOUNTER — Telehealth: Payer: Self-pay | Admitting: *Deleted

## 2018-08-27 ENCOUNTER — Other Ambulatory Visit: Payer: Self-pay | Admitting: Pulmonary Disease

## 2018-08-27 NOTE — Telephone Encounter (Signed)
Followed up with [facility], patient's consult appt was on 7/13 at 4:00 pm with Dr. Johney Maine.

## 2018-09-01 ENCOUNTER — Telehealth: Payer: Self-pay | Admitting: Cardiology

## 2018-09-01 NOTE — Telephone Encounter (Signed)
Patient called and states that he will need someone to assist him during his upcoming appt. He is wheelchair bound and unable to move himself through the office.

## 2018-09-01 NOTE — Telephone Encounter (Signed)
Returned call to pt.  Pt is in a wheelchair and needs assistance to his appt 09/04/2018. It's ok for wife to accompany him to his appt.
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
## 2018-09-04 ENCOUNTER — Other Ambulatory Visit: Payer: Self-pay

## 2018-09-04 ENCOUNTER — Encounter: Payer: Self-pay | Admitting: Cardiology

## 2018-09-04 ENCOUNTER — Ambulatory Visit (INDEPENDENT_AMBULATORY_CARE_PROVIDER_SITE_OTHER): Payer: Medicare Other | Admitting: Cardiology

## 2018-09-04 VITALS — BP 132/60 | HR 59 | Ht 66.0 in | Wt 237.8 lb

## 2018-09-04 DIAGNOSIS — I259 Chronic ischemic heart disease, unspecified: Secondary | ICD-10-CM | POA: Diagnosis not present

## 2018-09-04 DIAGNOSIS — I10 Essential (primary) hypertension: Secondary | ICD-10-CM

## 2018-09-04 DIAGNOSIS — I25810 Atherosclerosis of coronary artery bypass graft(s) without angina pectoris: Secondary | ICD-10-CM | POA: Diagnosis not present

## 2018-09-04 DIAGNOSIS — I5032 Chronic diastolic (congestive) heart failure: Secondary | ICD-10-CM

## 2018-09-04 DIAGNOSIS — E785 Hyperlipidemia, unspecified: Secondary | ICD-10-CM

## 2018-09-04 DIAGNOSIS — Z794 Long term (current) use of insulin: Secondary | ICD-10-CM

## 2018-09-04 DIAGNOSIS — E119 Type 2 diabetes mellitus without complications: Secondary | ICD-10-CM

## 2018-09-04 DIAGNOSIS — E1151 Type 2 diabetes mellitus with diabetic peripheral angiopathy without gangrene: Secondary | ICD-10-CM

## 2018-09-04 NOTE — Patient Instructions (Signed)
Medication Instructions:   Your physician recommends that you continue on your current medications as directed. Please refer to the Current Medication list given to you today.  If you need a refill on your cardiac medications before your next appointment, please call your pharmacy.   Lab work:  None ordered today  If you have labs (blood work) drawn today and your tests are completely normal, you will receive your results only by: . MyChart Message (if you have MyChart) OR . A paper copy in the mail If you have any lab test that is abnormal or we need to change your treatment, we will call you to review the results.  Testing/Procedures:  None ordered today  Follow-Up: At [facility], you and your health needs are our priority.  As part of our continuing mission to provide you with exceptional heart care, we have created designated Provider Care Teams.  These Care Teams include your primary Cardiologist (physician) and Advanced Practice Providers (APPs -  Physician Assistants and Nurse Practitioners) who all work together to provide you with the care you need, when you need it. You will need a follow up appointment in 6 months.  Please call our office 2 months in advance to schedule this appointment.  You may see Mark Skains, MD or one of the following Advanced Practice Providers on your designated Care Team:   Lori Gerhardt, NP Laura Ingold, NP . Jill McDaniel, NP    

## 2018-09-08 ENCOUNTER — Other Ambulatory Visit: Payer: Self-pay | Admitting: Cardiology

## 2018-09-19 ENCOUNTER — Other Ambulatory Visit: Payer: Self-pay | Admitting: Cardiology

## 2018-10-07 ENCOUNTER — Other Ambulatory Visit: Payer: Self-pay

## 2018-10-07 ENCOUNTER — Encounter: Payer: Self-pay | Admitting: Podiatry

## 2018-10-07 ENCOUNTER — Other Ambulatory Visit: Payer: Self-pay | Admitting: Podiatry

## 2018-10-07 ENCOUNTER — Ambulatory Visit: Payer: Medicare Other | Admitting: Podiatry

## 2018-10-07 ENCOUNTER — Ambulatory Visit (INDEPENDENT_AMBULATORY_CARE_PROVIDER_SITE_OTHER): Payer: Medicare Other

## 2018-10-07 DIAGNOSIS — S8254XA Nondisplaced fracture of medial malleolus of right tibia, initial encounter for closed fracture: Secondary | ICD-10-CM

## 2018-10-07 DIAGNOSIS — E1151 Type 2 diabetes mellitus with diabetic peripheral angiopathy without gangrene: Secondary | ICD-10-CM | POA: Diagnosis not present

## 2018-10-07 DIAGNOSIS — M79676 Pain in unspecified toe(s): Secondary | ICD-10-CM

## 2018-10-07 DIAGNOSIS — M7751 Other enthesopathy of right foot: Secondary | ICD-10-CM

## 2018-10-07 DIAGNOSIS — B351 Tinea unguium: Secondary | ICD-10-CM

## 2018-10-07 DIAGNOSIS — D689 Coagulation defect, unspecified: Secondary | ICD-10-CM

## 2018-10-08 ENCOUNTER — Encounter: Payer: Self-pay | Admitting: Podiatry

## 2018-10-08 NOTE — Progress Notes (Signed)
Hunter [name] presents today chief complaint of an aching burning red swollen right ankle.  States that a few days ago he may have injured it when he hit his ankle with a hammer he states that he cannot bear weight on the foot anyway very well but definitely not now it is exquisitely tender.  He is also complaining of painfully elongated toenails.  Objective: Vital signs are stable he is alert and oriented x3.  Pulses are palpable.  They are minimally palpable but they are present.  Capillary fill time is somewhat sluggish right medial ankle does demonstrate edema and erythema with the majority of the tenderness above the medial malleolus.  He also has exquisite tenderness on the distal aspect of the medial malleolus.  He states that the pain superior came last but the swelling.  Initially it was the pain just around the ankle.  Toenails are thick yellow dystrophic clinically mycotic.  Radiographs taken today demonstrates what appears to be a fracture to the very distalmost aspect of the medial malleolus nondisplaced minimally comminuted.  Assessment: Nonambulatory diabetic vascular disease.  Nail dystrophy onychomycosis nails 1 through 5 bilateral.  Fracture distalmost aspect fibula right.  Plan: Debrided toenails 1 through 5.  Placed him and compression dressings told him to stay off of the foot and keep it elevated as much as possible I will follow-up with him in a couple of weeks.  Notify us if this worsens.

## 2018-10-09 ENCOUNTER — Telehealth: Payer: Self-pay | Admitting: *Deleted

## 2018-10-09 NOTE — Telephone Encounter (Signed)
Yes a short cam

## 2018-10-09 NOTE — Telephone Encounter (Signed)
Pt states he saw Dr. Milinda Pointer 10/07/2018 for an ankle fracture, an ace wrap was applied and was instructed to call if he needed a boot, and he feels he would be able to transfer better with a boot.

## 2018-10-09 NOTE — Telephone Encounter (Signed)
I informed pt of Dr. Stephenie Acres order for short cam boot. Pt states he will come by tomorrow.

## 2018-10-28 ENCOUNTER — Ambulatory Visit: Payer: Medicare Other | Admitting: Podiatry

## 2018-11-11 ENCOUNTER — Other Ambulatory Visit: Payer: Self-pay

## 2018-11-11 ENCOUNTER — Ambulatory Visit (INDEPENDENT_AMBULATORY_CARE_PROVIDER_SITE_OTHER): Payer: Medicare Other

## 2018-11-11 ENCOUNTER — Ambulatory Visit: Payer: Medicare Other | Admitting: Podiatry

## 2018-11-11 DIAGNOSIS — D689 Coagulation defect, unspecified: Secondary | ICD-10-CM | POA: Diagnosis not present

## 2018-11-11 DIAGNOSIS — E1151 Type 2 diabetes mellitus with diabetic peripheral angiopathy without gangrene: Secondary | ICD-10-CM

## 2018-11-11 DIAGNOSIS — S8254XA Nondisplaced fracture of medial malleolus of right tibia, initial encounter for closed fracture: Secondary | ICD-10-CM | POA: Diagnosis not present

## 2018-11-13 ENCOUNTER — Encounter: Payer: Self-pay | Admitting: Podiatry

## 2018-11-13 NOTE — Progress Notes (Signed)
Subjective:  Patient ID: Hunter [name], male    DOB: 18-Jul-1950,  MRN: [id number]  Chief Complaint  Patient presents with  . Foot Pain    pt is here for a 4 week f/u on fracture, pt states that he is doing a lot better since the last time he was here, pt also states that it does burn a little bit as well    68 y.o. male presents with the above complaint.  Patient is a follow-up from a nondisplaced ankle fracture.  He states that he has been doing well.  He has been weightbearing as tolerated without any pain in his boot.  He states that it is all healed.  He has been wearing compression socks for edema.  He denies any other acute complaints.   Review of Systems: Negative except as noted in the HPI. Denies N/V/F/Ch.  Past Medical History:  Diagnosis Date  . Bilateral lower extremity edema   . CAD (coronary artery disease) cardiologist-  dr [name]   a. Remote MI with stent in 2005;  b. CABG x 5 in 2005;  c. 09/2010 Cath: LM nl, LAD 70p, 90-56m, D1 80p, LCX 85m, OM1 100, OM2 90ost, OM3 80, RCA 37m, VG->PDA->RPL nl, VG->Diag nl, VG->OM1 nl, LIMA->LAD nl, EF 60%-->Med Rx.  . Chronic venous insufficiency    post vein harvest for CABG  . COPD (chronic obstructive pulmonary disease) ([place])   . Diabetes mellitus type 2, controlled ([place])   . Difficult intravenous access 06-16-2015   multiple attempt IV starts until use of scanner  . ED (erectile dysfunction) of organic origin   . Foot drop, bilateral   . GERD (gastroesophageal reflux disease)   . H/O spinal cord injury    a. more or less wheelchair bound. post mulitple back surgery's including resection spinal tumor  . History of acute myocardial infarction of inferior wall    02/ 2005 inferoposterior  w/ stenting to RCA  . 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 History of cellulitis    left lower leg-- now resolved  . History of ulcer of lower limb    venous statis  ---   resolved  . Hypertension   . Increased liver enzymes    due to alcohol use  . Left rotator cuff tear   . Myocardial infarction [facility]) 2005   with stent placement  . Nocturia   . OA (osteoarthritis)    left shoulder  . OAB (overactive bladder)   . OSA on CPAP   . PAD (peripheral artery disease) ([place])    followed by Dr. [name]  . Peripheral arterial occlusive disease ([place])    followed by dr fields  . 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 Type 2 diabetes mellitus ([place])   . Urinary incontinence, urge   . Wheelchair bound     Current Outpatient Medications:  .  albuterol (PROVENTIL) (2.5 MG/3ML) 0.083% nebulizer solution, Take 3 mLs (2.5 mg total) by nebulization every 6 (six) hours and as needed for wheezing or shortness of breath., Disp: 120 mL, Rfl: 11 .  aspirin 81 MG tablet, Take 81 mg by mouth at bedtime. , Disp: , Rfl:  .  cetirizine (ZYRTEC) 10 MG tablet, Take 10 mg by mouth daily., Disp: , Rfl:  .  clopidogrel (PLAVIX) 75 MG tablet, TAKE 1 TABLET BY MOUTH EVERY DAY, Disp: 90 tablet, Rfl: 3 .  Docusate Calcium (STOOL SOFTENER PO), Take 100 mg by mouth every evening. , Disp: , Rfl:  .  doxazosin (CARDURA) 2 MG tablet, TAKE 1 TABLET (2 MG TOTAL) BY MOUTH AT BEDTIME., Disp: 90 tablet, Rfl: 0 .  fluticasone (FLONASE) 50 MCG/ACT nasal spray, Place 1 spray into both nostrils at bedtime as needed for allergies. , Disp: , Rfl:  .  furosemide (LASIX) 40 MG tablet, TAKE 1 TABLET BY MOUTH TWICE A DAY, Disp: 180 tablet, Rfl: 2 .  gabapentin (NEURONTIN) 300 MG capsule, , Disp: , Rfl:  .  HUMALOG KWIKPEN 100 UNIT/ML SOPN, Inject 24 Units into the skin 3 (three) times daily. , Disp: , Rfl:  .  ibuprofen (ADVIL,MOTRIN) 200 MG tablet, Take 400 mg by mouth every 6 (six) hours as needed for headache or moderate pain., Disp: , Rfl:  .  ipratropium (ATROVENT) 0.06 % nasal spray, , Disp: , Rfl:  .  ketotifen (ZADITOR) 0.025 % ophthalmic solution, Place 2 drops as needed into both eyes (for allergies). ,  Disp: , Rfl:  .  KLOR-CON M20 20 MEQ tablet, TAKE 1 TABLET BY MOUTH EVERY DAY, Disp: 90 tablet, Rfl: 3 .  metoprolol tartrate (LOPRESSOR) 50 MG tablet, TAKE 1 TABLET BY MOUTH TWICE A DAY, Disp: 180 tablet, Rfl: 3 .  montelukast (SINGULAIR) 10 MG tablet, TAKE 1 TABLET BY MOUTH EVERYDAY AT BEDTIME, Disp: 90 tablet, Rfl: 0 .  nitroGLYCERIN (NITROSTAT) 0.4 MG SL tablet, Place 1 tablet (0.4 mg total) under the tongue every 5 (five) minutes as needed for chest pain (chest pain)., Disp: 25 tablet, Rfl: prn .  NOVOFINE 32G X 6 MM MISC, USE ONCE DAILY WITH LEVEMIR PEN, Disp: , Rfl: 1 .  omeprazole (PRILOSEC) 40 MG capsule, Take 40 mg by mouth 2 (two) times daily., Disp: , Rfl:  .  ONETOUCH VERIO test strip, USE TO SELF MONITOR BLOOD GLUCOSE UP TO 4 TIMES DAILY (E11.65), Disp: , Rfl: 4 .  oxybutynin (DITROPAN XL) 15 MG 24 hr tablet, Take 15 mg by mouth at bedtime. , Disp: , Rfl:  .  pravastatin (PRAVACHOL) 40 MG tablet, Take 40 mg by mouth daily., Disp: , Rfl: 2 .  PROAIR HFA 108 (90 Base) MCG/ACT inhaler, INHALE 2-4 PUFFS UP TO 4 TIMES DAILY AS NEEDED FOR WHEEZING, Disp: , Rfl: 3 .  Respiratory Therapy Supplies (FLUTTER) DEVI, Use as directed, Disp: 1 each, Rfl: 0 .  sodium chloride HYPERTONIC 3 % nebulizer solution, 4 mL inhaled via nebulizer twice daily., Disp: 240 mL, Rfl: 3 .  tamsulosin (FLOMAX) 0.4 MG CAPS capsule, Take 0.4 mg by mouth daily., Disp: , Rfl:  .  traMADol (ULTRAM) 50 MG tablet, Take 1 tablet (50 mg total) 3 (three) times daily as needed by mouth for moderate pain., Disp: 30 tablet, Rfl: 4 .  TRESIBA FLEXTOUCH 200 UNIT/ML SOPN, Inject 46 Units into the skin daily., Disp: , Rfl: 6 .  triamterene-hydrochlorothiazide (MAXZIDE-25) 37.5-25 MG per tablet, Take 1 tablet by mouth daily. , Disp: , Rfl:  .  Vitamin D, Ergocalciferol, (DRISDOL) 50000 units CAPS capsule, Take 50,000 Units by mouth every Wednesday. , Disp: , Rfl: 3  Social History   Tobacco Use  Smoking Status Former Smoker  .  Packs/day: 0.50  . Years: 40.00  . Pack years: 20.00  . Types: Cigarettes  . Quit date: 01/01/2009  . Years since quitting: 9.8  Smokeless Tobacco Never Used  Tobacco Comment   as of 06-09-2015 per pt last month lite  smoker    Allergies  Allergen Reactions  . Codeine Nausea And Vomiting and Other (See Comments)    Severe stomach cramps  . Lisinopril Cough  . Ramipril Cough  . Tape Other (See Comments) and Tinitus    Only plastic tape--can use paper tape OK. Blisters   Objective:  There were no vitals filed for this visit. There is no height or weight on file to calculate BMI. Constitutional Well developed. Well nourished.  Vascular Dorsalis pedis pulses palpable bilaterally. Posterior tibial pulses palpable bilaterally. Capillary refill normal to all digits.  No cyanosis or clubbing noted. Pedal hair growth normal.  Neurologic Normal speech. Oriented to person, place, and time. Epicritic sensation to light touch grossly present bilaterally.  Dermatologic Nails well groomed and normal in appearance. No open wounds. No skin lesions.  Orthopedic:  No pain on palpation to the medial malleolus of the lateral malleolus.  No pain with ankle range of motion active and passive.  No pain along any of the tendons or soft tissue.   Radiographs: X-rays were reviewed 3 views of skeletally mature ankle right: It appears that the ankle fracture is well healed.  There is cortical trabeculation noted across the fracture site.  No signs of displacement noted.  Ankle mortise well aligned with congruity. Assessment:   1. Type II diabetes mellitus with peripheral circulatory disorder (HCC)   2. Closed nondisplaced fracture of medial malleolus of right tibia, initial encounter   3. Coagulation defect [facility])    Plan:  Patient was evaluated and treated and all questions answered.  Right foot nondisplaced ankle fracture -Resolved.  I transition the patient from boot to regular sneakers.  Patient  said he primarily uses her shoes for transfers.   No follow-ups on file.

## 2018-11-25 ENCOUNTER — Other Ambulatory Visit: Payer: Self-pay | Admitting: Pulmonary Disease

## 2018-11-26 ENCOUNTER — Encounter: Payer: Self-pay | Admitting: Pulmonary Disease

## 2018-11-26 ENCOUNTER — Ambulatory Visit (INDEPENDENT_AMBULATORY_CARE_PROVIDER_SITE_OTHER): Payer: Medicare Other | Admitting: Pulmonary Disease

## 2018-11-26 ENCOUNTER — Other Ambulatory Visit: Payer: Self-pay

## 2018-11-26 VITALS — BP 122/76 | HR 71 | Temp 98.2°F | Ht 66.0 in | Wt 240.0 lb

## 2018-11-26 DIAGNOSIS — R0600 Dyspnea, unspecified: Secondary | ICD-10-CM

## 2018-11-26 DIAGNOSIS — K769 Liver disease, unspecified: Secondary | ICD-10-CM

## 2018-11-26 DIAGNOSIS — J449 Chronic obstructive pulmonary disease, unspecified: Secondary | ICD-10-CM | POA: Diagnosis not present

## 2018-11-26 DIAGNOSIS — J398 Other specified diseases of upper respiratory tract: Secondary | ICD-10-CM

## 2018-11-26 DIAGNOSIS — R0609 Other forms of dyspnea: Secondary | ICD-10-CM

## 2018-11-26 MED ORDER — ALBUTEROL SULFATE (2.5 MG/3ML) 0.083% IN NEBU: INHALATION_SOLUTION | RESPIRATORY_TRACT | 11 refills | Status: DC

## 2018-11-26 NOTE — Patient Instructions (Addendum)
Thank you for visiting Dr. Valeta Harms at [facility]. Today we recommend the following:  Please continue albuterol as needed. New refills of albuterol solution today.   Please set patient up with Dr. Ander Slade for next available sleep consultation. Currently on CPAP with OSA.   Return in about 1 year (around 11/26/2019) for with APP or Dr. Valeta Harms.    Please do your part to reduce the spread of COVID-19.
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
## 2018-11-28 ENCOUNTER — Telehealth: Payer: Self-pay | Admitting: Pulmonary Disease

## 2018-11-28 ENCOUNTER — Telehealth: Payer: Self-pay

## 2018-11-28 NOTE — Telephone Encounter (Signed)
Called pt to give him the appt info for MRI  Abdomen ordered by Dr. Valeta Harms.  Pt would like to know why Dr. Valeta Harms ordered the MRI Abdomen if he sees Dr. Valeta Harms for his lungs.  Pt states a returned phone call does not need to be today, ok to wait until Monday.

## 2018-11-28 NOTE — Telephone Encounter (Signed)
Call made to patient, confirmed DOB, pt states he does want the abdominal scan. 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 He would like the scan cancelled. I made him aware I would cancel the scan and make BI aware. Voiced understanding.   Nothing further needed at this time.   Will route to BI as FYI.  Scan cancelled.

## 2018-11-28 NOTE — Telephone Encounter (Signed)
Will route message to [facility] pool to cancel scan. Attempted to cancel in epic however because scan is already scheduled it will not let me cancel.  Please cancel. Thanks.

## 2018-11-28 NOTE — Telephone Encounter (Signed)
Error

## 2018-11-28 NOTE — Addendum Note (Signed)
Addended by: Vivia Ewing on: 11/28/2018 11:41 AM   Modules accepted: Orders

## 2018-11-28 NOTE — Telephone Encounter (Signed)
PCCM: Ok thanks Garner Nash, DO [facility] 11/28/2018 12:30 PM

## 2018-12-01 ENCOUNTER — Telehealth: Payer: Self-pay

## 2018-12-01 NOTE — Telephone Encounter (Signed)
Scan has been cancelled.  Nothing further needed at this time.

## 2018-12-08 ENCOUNTER — Ambulatory Visit (HOSPITAL_COMMUNITY): Payer: Medicare Other

## 2018-12-11 ENCOUNTER — Telehealth: Payer: Self-pay | Admitting: Cardiology

## 2018-12-11 NOTE — Telephone Encounter (Signed)
New Message:  Patient had labs drawn at his PCP yesterday, Dr. Bevelyn Buckles. He told Dr. Sharlett Iles to send the lab results to Dr. [name], but he just wanted to make [place] aware they would be coming.  He was told he needed to have labs drawn before his appointment in March. He is hoping that these labs will be sufficient. I they are not, please let the patient know so he can make arrangements to get labs for Dr. [name]

## 2018-12-15 NOTE — Telephone Encounter (Signed)
Pt aware we have not received any lab results from Dr Buel Ream office at this time.  Pt reports he has not received the results either and will call their office.

## 2018-12-23 ENCOUNTER — Other Ambulatory Visit: Payer: Self-pay | Admitting: Cardiology

## 2018-12-23 ENCOUNTER — Other Ambulatory Visit: Payer: Self-pay | Admitting: Pulmonary Disease

## 2019-01-08 ENCOUNTER — Other Ambulatory Visit: Payer: Self-pay

## 2019-01-08 ENCOUNTER — Ambulatory Visit: Payer: Medicare Other | Admitting: Podiatry

## 2019-01-08 ENCOUNTER — Encounter: Payer: Self-pay | Admitting: Podiatry

## 2019-01-08 DIAGNOSIS — D689 Coagulation defect, unspecified: Secondary | ICD-10-CM

## 2019-01-08 DIAGNOSIS — M79676 Pain in unspecified toe(s): Secondary | ICD-10-CM | POA: Diagnosis not present

## 2019-01-08 DIAGNOSIS — E1151 Type 2 diabetes mellitus with diabetic peripheral angiopathy without gangrene: Secondary | ICD-10-CM | POA: Diagnosis not present

## 2019-01-08 DIAGNOSIS — B351 Tinea unguium: Secondary | ICD-10-CM | POA: Diagnosis not present

## 2019-01-08 NOTE — Progress Notes (Signed)
He presents today chief complaint of painfully elongated toenails.  Objective: Vital signs stable alert oriented x3 diabetic peripheral neuropathy is identified today mild to moderate edema flexible hammertoe deformities.  Assessment: Pain in limb secondary to diabetes peripheral neuropathy pain in limb secondary to onychomycosis.  Plan: Discussed etiology pathology conservative surgical therapies debrided toenails reactive hyperkeratotic tissue tissue and also used a buttress pad for his hammertoes.

## 2019-01-15 ENCOUNTER — Ambulatory Visit: Payer: Medicare Other | Attending: Internal Medicine

## 2019-01-15 DIAGNOSIS — Z20822 Contact with and (suspected) exposure to covid-19: Secondary | ICD-10-CM

## 2019-01-17 LAB — NOVEL CORONAVIRUS, NAA: SARS-CoV-2, NAA: NOT DETECTED

## 2019-01-20 ENCOUNTER — Ambulatory Visit: Payer: Medicare Other | Attending: Internal Medicine

## 2019-01-20 DIAGNOSIS — Z20822 Contact with and (suspected) exposure to covid-19: Secondary | ICD-10-CM

## 2019-01-21 LAB — NOVEL CORONAVIRUS, NAA: SARS-CoV-2, NAA: DETECTED — AB

## 2019-02-05 ENCOUNTER — Other Ambulatory Visit: Payer: Self-pay | Admitting: Cardiology

## 2019-02-05 MED ORDER — NITROGLYCERIN 0.4 MG SL SUBL: 0.4000 mg | SUBLINGUAL_TABLET | SUBLINGUAL | 5 refills | Status: DC | PRN

## 2019-02-05 NOTE — Telephone Encounter (Signed)
Pt's medication was sent to pt's pharmacy as requested. Confirmation received.  °

## 2019-02-26 DIAGNOSIS — M6530 Trigger finger, unspecified finger: Secondary | ICD-10-CM | POA: Insufficient documentation

## 2019-03-20 ENCOUNTER — Ambulatory Visit: Payer: Medicare Other | Admitting: Cardiology

## 2019-03-20 ENCOUNTER — Other Ambulatory Visit: Payer: Self-pay

## 2019-03-20 ENCOUNTER — Encounter: Payer: Self-pay | Admitting: Cardiology

## 2019-03-20 VITALS — BP 140/60 | HR 70 | Ht 66.0 in | Wt 244.0 lb

## 2019-03-20 DIAGNOSIS — I259 Chronic ischemic heart disease, unspecified: Secondary | ICD-10-CM

## 2019-03-20 DIAGNOSIS — E785 Hyperlipidemia, unspecified: Secondary | ICD-10-CM

## 2019-03-20 DIAGNOSIS — I25810 Atherosclerosis of coronary artery bypass graft(s) without angina pectoris: Secondary | ICD-10-CM | POA: Diagnosis not present
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
## 2019-03-20 NOTE — Patient Instructions (Signed)
Medication Instructions:  The current medical regimen is effective;  continue present plan and medications.  *If you need a refill on your cardiac medications before your next appointment, please call your pharmacy*  Follow-Up: At [facility], you and your health needs are our priority.  As part of our continuing mission to provide you with exceptional heart care, we have created designated Provider Care Teams.  These Care Teams include your primary Cardiologist (physician) and Advanced Practice Providers (APPs -  Physician Assistants and Nurse Practitioners) who all work together to provide you with the care you need, when you need it.  We recommend signing up for the patient portal called "MyChart".  Sign up information is provided on this After Visit Summary.  MyChart is used to connect with patients for Virtual Visits (Telemedicine).  Patients are able to view lab/test results, encounter notes, upcoming appointments, etc.  Non-urgent messages can be sent to your provider as well.   To learn more about what you can do with MyChart, go to [email or web address].    Your next appointment:   6 month(s)  The format for your next appointment:   In Person  Provider:   Cecilie Kicks, NP and 1 year with Dr [name].   Thank you for choosing [facility]!!

## 2019-03-24 ENCOUNTER — Other Ambulatory Visit: Payer: Self-pay | Admitting: Cardiology

## 2019-03-26 ENCOUNTER — Other Ambulatory Visit: Payer: Self-pay | Admitting: Cardiology

## 2019-04-09 ENCOUNTER — Ambulatory Visit: Payer: Medicare Other | Admitting: Podiatry

## 2019-04-09 ENCOUNTER — Other Ambulatory Visit: Payer: Self-pay

## 2019-04-09 DIAGNOSIS — E1151 Type 2 diabetes mellitus with diabetic peripheral angiopathy without gangrene: Secondary | ICD-10-CM | POA: Diagnosis not present

## 2019-04-09 DIAGNOSIS — M79676 Pain in unspecified toe(s): Secondary | ICD-10-CM

## 2019-04-09 DIAGNOSIS — D689 Coagulation defect, unspecified: Secondary | ICD-10-CM | POA: Diagnosis not present

## 2019-04-09 DIAGNOSIS — B351 Tinea unguium: Secondary | ICD-10-CM | POA: Diagnosis not present

## 2019-04-09 NOTE — Progress Notes (Signed)
Hunter [name] presents today chief complaint of painfully elongated toenails.  Objective: Pulses are nonpalpable no open lesions or wounds at this point.  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  Assessment: Pain in limb secondary to peripheral vascular disease neuropathy and painful toenails.  Plan: Debridement of toenails 1 through 5 bilateral.

## 2019-05-05 ENCOUNTER — Ambulatory Visit: Payer: Medicare Other

## 2019-05-05 ENCOUNTER — Encounter (HOSPITAL_COMMUNITY): Payer: Medicare Other

## 2019-05-06 ENCOUNTER — Other Ambulatory Visit: Payer: Self-pay | Admitting: *Deleted

## 2019-05-06 DIAGNOSIS — I779 Disorder of arteries and arterioles, unspecified: Secondary | ICD-10-CM

## 2019-05-06 DIAGNOSIS — I872 Venous insufficiency (chronic) (peripheral): Secondary | ICD-10-CM

## 2019-05-07 ENCOUNTER — Telehealth (HOSPITAL_COMMUNITY): Payer: Self-pay

## 2019-05-07 NOTE — Telephone Encounter (Signed)

## 2019-05-08 ENCOUNTER — Ambulatory Visit (INDEPENDENT_AMBULATORY_CARE_PROVIDER_SITE_OTHER): Payer: Medicare Other | Admitting: Physician Assistant

## 2019-05-08 ENCOUNTER — Ambulatory Visit (HOSPITAL_COMMUNITY)
Admission: RE | Admit: 2019-05-08 | Discharge: 2019-05-08 | Disposition: A | Discharge status: Home / Self Care | Payer: Medicare Other | Source: Ambulatory Visit | Attending: Vascular Surgery | Admitting: Vascular Surgery

## 2019-05-08 ENCOUNTER — Other Ambulatory Visit: Payer: Self-pay

## 2019-05-08 VITALS — BP 158/68 | HR 65 | Temp 98.0°F | Resp 20 | Ht 66.0 in | Wt 230.0 lb

## 2019-05-08 DIAGNOSIS — I779 Disorder of arteries and arterioles, unspecified: Secondary | ICD-10-CM

## 2019-05-08 DIAGNOSIS — I872 Venous insufficiency (chronic) (peripheral): Secondary | ICD-10-CM | POA: Diagnosis present
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
## 2019-05-11 ENCOUNTER — Other Ambulatory Visit: Payer: Self-pay | Admitting: *Deleted

## 2019-05-11 DIAGNOSIS — I779 Disorder of arteries and arterioles, unspecified: Secondary | ICD-10-CM

## 2019-05-11 DIAGNOSIS — I872 Venous insufficiency (chronic) (peripheral): Secondary | ICD-10-CM

## 2019-06-24 ENCOUNTER — Other Ambulatory Visit: Payer: Self-pay | Admitting: *Deleted

## 2019-06-24 MED ORDER — MONTELUKAST SODIUM 10 MG PO TABS: ORAL_TABLET | ORAL | 0 refills | Status: DC

## 2019-06-25 ENCOUNTER — Other Ambulatory Visit: Payer: Self-pay | Admitting: Cardiology

## 2019-07-09 ENCOUNTER — Ambulatory Visit: Payer: Medicare Other | Admitting: Podiatry

## 2019-07-28 ENCOUNTER — Other Ambulatory Visit: Payer: Self-pay

## 2019-07-28 ENCOUNTER — Encounter: Payer: Self-pay | Admitting: Podiatry

## 2019-07-28 ENCOUNTER — Ambulatory Visit (INDEPENDENT_AMBULATORY_CARE_PROVIDER_SITE_OTHER): Payer: Medicare Other | Admitting: Podiatry

## 2019-07-28 DIAGNOSIS — B351 Tinea unguium: Secondary | ICD-10-CM | POA: Diagnosis not present

## 2019-07-28 DIAGNOSIS — E1151 Type 2 diabetes mellitus with diabetic peripheral angiopathy without gangrene: Secondary | ICD-10-CM | POA: Diagnosis not present

## 2019-07-28 DIAGNOSIS — D689 Coagulation defect, unspecified: Secondary | ICD-10-CM

## 2019-07-28 DIAGNOSIS — M79676 Pain in unspecified toe(s): Secondary | ICD-10-CM | POA: Diagnosis not present

## 2019-07-29 NOTE — Progress Notes (Signed)
He presents today for nail debridement.  Objective: Toenails are long thick yellow dystrophic-like mycotic.  Skin appears to be in good shape.  Assessment: Pain in limb secondary to onychomycosis.  Plan: Debridement of toenails 1 through 5 bilateral.

## 2019-08-15 ENCOUNTER — Other Ambulatory Visit: Payer: Self-pay | Admitting: Cardiology

## 2019-09-11 LAB — IFOBT (OCCULT BLOOD): IFOBT: NEGATIVE
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
## 2019-09-23 ENCOUNTER — Other Ambulatory Visit: Payer: Self-pay

## 2019-09-23 ENCOUNTER — Encounter: Payer: Self-pay | Admitting: Cardiology

## 2019-09-23 ENCOUNTER — Telehealth (INDEPENDENT_AMBULATORY_CARE_PROVIDER_SITE_OTHER): Payer: Medicare Other | Admitting: Cardiology

## 2019-09-23 VITALS — BP 178/82 | Ht 65.0 in | Wt 235.0 lb

## 2019-09-23 DIAGNOSIS — E785 Hyperlipidemia, unspecified: Secondary | ICD-10-CM

## 2019-09-23 DIAGNOSIS — R0602 Shortness of breath: Secondary | ICD-10-CM | POA: Diagnosis not present

## 2019-09-23 DIAGNOSIS — I259 Chronic ischemic heart disease, unspecified: Secondary | ICD-10-CM | POA: Diagnosis not present

## 2019-09-23 DIAGNOSIS — I209 Angina pectoris, unspecified: Secondary | ICD-10-CM | POA: Diagnosis not present

## 2019-09-23 DIAGNOSIS — I25810 Atherosclerosis of coronary artery bypass graft(s) without angina pectoris: Secondary | ICD-10-CM

## 2019-09-23 DIAGNOSIS — I10 Essential (primary) hypertension: Secondary | ICD-10-CM

## 2019-09-23 MED ORDER — ISOSORBIDE MONONITRATE ER 30 MG PO TB24: 15.0000 mg | ORAL_TABLET | Freq: Every day | ORAL | 3 refills | Status: DC

## 2019-09-23 NOTE — Patient Instructions (Addendum)
Medication Instructions:  Your physician has recommended you make the following change in your medication:  -- START IMDUR (Isosorbide) 15 mg - Take 0.5 tablet (15 mg) by mouth daily -- NEW RX SENT *If you need a refill on your cardiac medications before your next appointment, please call your pharmacy*  Testing/Procedures: Your physician has requested that you have an echocardiogram. Echocardiography is a painless test that uses sound waves to create images of your heart. It provides your doctor with information about the size and shape of your heart and how well your heart's chambers and valves are working. This procedure takes approximately one hour. There are no restrictions for this procedure.  Follow-Up: At [facility], you and your health needs are our priority.  As part of our continuing mission to provide you with exceptional heart care, we have created designated Provider Care Teams.  These Care Teams include your primary Cardiologist (physician) and Advanced Practice Providers (APPs -  Physician Assistants and Nurse Practitioners) who all work together to provide you with the care you need, when you need it.  We recommend signing up for the patient portal called "MyChart".  Sign up information is provided on this After Visit Summary.  MyChart is used to connect with patients for Virtual Visits (Telemedicine).  Patients are able to view lab/test results, encounter notes, upcoming appointments, etc.  Non-urgent messages can be sent to your provider as well.   To learn more about what you can do with MyChart, go to [email or web address].    Your next appointment:   Your physician recommends that you schedule a follow-up appointment in: 3-4 weeks after your echocardiogram VIRTUALLY with Kathyrn Drown, NP.  The format for your next appointment:   Virtual Visit  with Kathyrn Drown, NP

## 2019-09-23 NOTE — Addendum Note (Signed)
Addended by: Campbell Riches on: 09/23/2019 11:22 AM   Modules accepted: Orders

## 2019-09-26 ENCOUNTER — Other Ambulatory Visit: Payer: Self-pay | Admitting: Pulmonary Disease

## 2019-09-26 MED ORDER — MONTELUKAST SODIUM 10 MG PO TABS: ORAL_TABLET | ORAL | 0 refills | Status: DC

## 2019-10-07 ENCOUNTER — Ambulatory Visit (HOSPITAL_COMMUNITY): Payer: Medicare Other | Attending: Internal Medicine

## 2019-10-07 ENCOUNTER — Other Ambulatory Visit: Payer: Self-pay

## 2019-10-07 DIAGNOSIS — I259 Chronic ischemic heart disease, unspecified: Secondary | ICD-10-CM | POA: Insufficient documentation

## 2019-10-07 LAB — ECHOCARDIOGRAM COMPLETE
Area-P 1/2: 3.97 cm2
S' Lateral: 3.9 cm

## 2019-10-07 MED ORDER — PERFLUTREN LIPID MICROSPHERE
1.0000 mL | INTRAVENOUS | Status: AC | PRN
  Administered 2019-10-07: 3 mL via INTRAVENOUS
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
## 2019-10-27 ENCOUNTER — Encounter: Payer: Self-pay | Admitting: Cardiology

## 2019-10-27 ENCOUNTER — Telehealth (INDEPENDENT_AMBULATORY_CARE_PROVIDER_SITE_OTHER): Payer: Medicare Other | Admitting: Cardiology

## 2019-10-27 ENCOUNTER — Other Ambulatory Visit: Payer: Self-pay

## 2019-10-27 ENCOUNTER — Telehealth: Payer: Self-pay

## 2019-10-27 VITALS — Ht 66.0 in

## 2019-10-27 DIAGNOSIS — I5032 Chronic diastolic (congestive) heart failure: Secondary | ICD-10-CM

## 2019-10-27 DIAGNOSIS — E119 Type 2 diabetes mellitus without complications: Secondary | ICD-10-CM

## 2019-10-27 DIAGNOSIS — E785 Hyperlipidemia, unspecified: Secondary | ICD-10-CM

## 2019-10-27 DIAGNOSIS — Z794 Long term (current) use of insulin: Secondary | ICD-10-CM

## 2019-10-27 DIAGNOSIS — I259 Chronic ischemic heart disease, unspecified: Secondary | ICD-10-CM

## 2019-10-27 DIAGNOSIS — I10 Essential (primary) hypertension: Secondary | ICD-10-CM | POA: Diagnosis not present

## 2019-10-27 DIAGNOSIS — I25810 Atherosclerosis of coronary artery bypass graft(s) without angina pectoris: Secondary | ICD-10-CM

## 2019-10-27 NOTE — Telephone Encounter (Signed)
  Patient Consent for Virtual Visit         Hunter [name] has provided verbal consent on 10/27/2019 for a virtual visit (video or telephone).   CONSENT FOR VIRTUAL VISIT FOR:  Hunter [name]  By participating in this virtual visit I agree to the following:  I hereby voluntarily request, consent and authorize [facility] and its employed or contracted physicians, physician assistants, nurse practitioners or other licensed health care professionals (the Practitioner), to provide me with telemedicine health care services (the "Services") as deemed necessary by the treating Practitioner. I acknowledge and consent to receive the Services by the Practitioner via telemedicine. I understand that the telemedicine visit will involve communicating with the Practitioner through live audiovisual communication technology and the disclosure of certain medical information by electronic transmission. I acknowledge that I have been given the opportunity to request an in-person assessment or other available alternative prior to the telemedicine visit and am voluntarily participating in the telemedicine visit.  I understand that I have the right to withhold or withdraw my consent to the use of telemedicine in the course of my care at any time, without affecting my right to future care or treatment, and that the Practitioner or I may terminate the telemedicine visit at any time. I understand that I have the right to inspect all information obtained and/or recorded in the course of the telemedicine visit and may receive copies of available information for a reasonable fee.  I understand that some of the potential risks of receiving the Services via telemedicine include:  [name] Delay or interruption in medical evaluation due to technological equipment failure or disruption; . Information transmitted may not be sufficient (e.g. poor resolution of images) to allow for appropriate medical decision making by the  Practitioner; and/or  . In rare instances, security protocols could fail, causing a breach of personal health information.  Furthermore, I acknowledge that it is my responsibility to provide information about my medical history, conditions and care that is complete and accurate to the best of my ability. I acknowledge that Practitioner's advice, recommendations, and/or decision may be based on factors not within their control, such as incomplete or inaccurate data provided by me or distortions of diagnostic images or specimens that may result from electronic transmissions. I understand that the practice of medicine is not an exact science and that Practitioner makes no warranties or guarantees regarding treatment outcomes. I acknowledge that a copy of this consent can be made available to me via my patient portal ([facility]), or I can request a printed copy by calling the office of [facility].    I understand that my insurance will be billed for this visit.   I have read or had this consent read to me. . I understand the contents of this consent, which adequately explains the benefits and risks of the Services being provided via telemedicine.  . I have been provided ample opportunity to ask questions regarding this consent and the Services and have had my questions answered to my satisfaction. . I give my informed consent for the services to be provided through the use of telemedicine in my medical care

## 2019-10-27 NOTE — Patient Instructions (Addendum)
Medication Instructions:  Your physician recommends that you continue on your current medications as directed. Please refer to the Current Medication list given to you today.  *If you need a refill on your cardiac medications before your next appointment, please call your pharmacy*   Lab Work: Please have your PCP send [place] a copy of your lab results.   If you have labs (blood work) drawn today and your tests are completely normal, you will receive your results only by: [name] MyChart Message (if you have MyChart) OR . A paper copy in the mail If you have any lab test that is abnormal or we need to change your treatment, we will call you to review the results.   Testing/Procedures: None   Follow-Up: Follow up with Dr. [name] on 01/27/20 on 3:40 PM   Other Instructions None

## 2019-10-28 ENCOUNTER — Telehealth: Payer: Self-pay | Admitting: Cardiology

## 2019-10-28 NOTE — Telephone Encounter (Signed)
Pt c/o BP issue: STAT if pt c/o blurred vision, one-sided weakness or slurred speech  1. What are your last 5 BP readings? Sat. 168/64 HR 74, Sun 166/60 HR 70, Mon 168/67 HR 67  2. Are you having any other symptoms (ex. Dizziness, headache, blurred vision, passed out)? no  3. What is your BP issue? Hunter [name] from [employer] called with elevated BP readings from this week. She would like to patient to receive the call back.

## 2019-10-28 NOTE — Telephone Encounter (Signed)
Patient had telemedicine visit with Kathyrn Drown, NP yesterday.     2. Hypertension: -Elevated, 152/58 however reports some diet indiscretions with increased sodium intake -Asked him to continue with BP log.  He reports multiple intolerances to antihypertensives.  Would consider referral to hypertension clinic  -Continuecurrent regimen  Above is from yesterday's note.  Patient checks BP in the morning prior to taking AM medications.  I asked him to check about 2 hours after taking AM medications and let us know if BP continues to be elevated.

## 2019-10-29 ENCOUNTER — Other Ambulatory Visit: Payer: Self-pay

## 2019-10-29 ENCOUNTER — Encounter: Payer: Self-pay | Admitting: Podiatry

## 2019-10-29 ENCOUNTER — Ambulatory Visit: Payer: Medicare Other | Admitting: Podiatry

## 2019-10-29 DIAGNOSIS — B351 Tinea unguium: Secondary | ICD-10-CM | POA: Diagnosis not present

## 2019-10-29 DIAGNOSIS — M79676 Pain in unspecified toe(s): Secondary | ICD-10-CM

## 2019-10-29 DIAGNOSIS — D689 Coagulation defect, unspecified: Secondary | ICD-10-CM

## 2019-10-29 DIAGNOSIS — E1151 Type 2 diabetes mellitus with diabetic peripheral angiopathy without gangrene: Secondary | ICD-10-CM | POA: Diagnosis not present

## 2019-10-31 NOTE — Progress Notes (Signed)
He presents today chief complaint of painfully elongated toenails.  Objective: Vital signs are stable he is alert oriented x3 pulses are barely palpable capillary fill time is immediate.  No open lesions or wounds.  Toenails are long thick yellow dystrophic-like mycotic.  They are painful on palpation.  Assessment: Pain in limb secondary to onychomycosis type 2 diabetes mellitus with peripheral vascular disease  Plan: Discussed etiology pathology and surgical therapies at this point debrided toenails 1 through 5 bilateral.

## 2019-11-24 ENCOUNTER — Telehealth: Payer: Self-pay | Admitting: Cardiology

## 2019-11-24 NOTE — Telephone Encounter (Signed)
[place] Nurse Case Manager called to provide the following information. She states if there are any questions to call her at [phone] ext [phone]. She cannot accept any orders to make changes via phone. She states if any advice to be given please contact the patient.   Pt c/o BP issue: STAT if pt c/o blurred vision, one-sided weakness or slurred speech  1. What are your last 5 BP readings?  11/20: 152/66 HR 70 11/21: 156/58 HR 68 11/22: 171/64 HR 67 11/23: 169/53 HR 63 While on the phone with Larrie Kass: 173/82  All BP are taken over an hour after taking medications   2. Are you having any other symptoms (ex. Dizziness, headache, blurred vision, passed out)?  No   3. What is your BP issue?  Elevated BP

## 2019-11-24 NOTE — Telephone Encounter (Signed)
Given his elevated blood pressure, lets start amlodipine 5 mg once a day. Continue to monitor at home.  [name], MD

## 2019-11-24 NOTE — Telephone Encounter (Signed)
Will forward to Dr [name] for his review

## 2019-11-25 MED ORDER — AMLODIPINE BESYLATE 5 MG PO TABS: 5.0000 mg | ORAL_TABLET | Freq: Every day | ORAL | 3 refills | Status: DC

## 2019-11-25 NOTE — Telephone Encounter (Signed)
Spoke with patient who reports his BP is monitored daily through a program with the insurance company.  He states he feels fine.  Advised we had received information from [place] with [facility] that his BP has been elevated.  Dr [name] reviewed and ordered for him to start Amlodipine 5 mg daily.  Pt is agreeable and RX sent into [facility].  Pt will continue to monitor his BP and make [place] aware if no improvement.  He was grateful for the call and information.

## 2020-01-27 ENCOUNTER — Telehealth: Payer: Self-pay | Admitting: *Deleted

## 2020-01-27 ENCOUNTER — Telehealth (INDEPENDENT_AMBULATORY_CARE_PROVIDER_SITE_OTHER): Payer: Medicare Other | Admitting: Cardiology

## 2020-01-27 ENCOUNTER — Encounter: Payer: Self-pay | Admitting: Cardiology

## 2020-01-27 ENCOUNTER — Other Ambulatory Visit: Payer: Self-pay

## 2020-01-27 VITALS — BP 134/59 | HR 69 | Ht 66.0 in

## 2020-01-27 DIAGNOSIS — I5032 Chronic diastolic (congestive) heart failure: Secondary | ICD-10-CM | POA: Diagnosis not present

## 2020-01-27 DIAGNOSIS — I10 Essential (primary) hypertension: Secondary | ICD-10-CM

## 2020-01-27 DIAGNOSIS — I259 Chronic ischemic heart disease, unspecified: Secondary | ICD-10-CM

## 2020-01-27 DIAGNOSIS — I739 Peripheral vascular disease, unspecified: Secondary | ICD-10-CM
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
## 2020-01-27 NOTE — Telephone Encounter (Signed)
  Patient Consent for Virtual Visit         Hunter [name] has provided verbal consent on 01/27/2020 for a virtual visit (video or telephone).   CONSENT FOR VIRTUAL VISIT FOR:  Hunter [name]  By participating in this virtual visit I agree to the following:  I hereby voluntarily request, consent and authorize [facility] and its employed or contracted physicians, physician assistants, nurse practitioners or other licensed health care professionals (the Practitioner), to provide me with telemedicine health care services (the "Services") as deemed necessary by the treating Practitioner. I acknowledge and consent to receive the Services by the Practitioner via telemedicine. I understand that the telemedicine visit will involve communicating with the Practitioner through live audiovisual communication technology and the disclosure of certain medical information by electronic transmission. I acknowledge that I have been given the opportunity to request an in-person assessment or other available alternative prior to the telemedicine visit and am voluntarily participating in the telemedicine visit.  I understand that I have the right to withhold or withdraw my consent to the use of telemedicine in the course of my care at any time, without affecting my right to future care or treatment, and that the Practitioner or I may terminate the telemedicine visit at any time. I understand that I have the right to inspect all information obtained and/or recorded in the course of the telemedicine visit and may receive copies of available information for a reasonable fee.  I understand that some of the potential risks of receiving the Services via telemedicine include:  [name] Delay or interruption in medical evaluation due to technological equipment failure or disruption; . Information transmitted may not be sufficient (e.g. poor resolution of images) to allow for appropriate medical decision making by the  Practitioner; and/or  . In rare instances, security protocols could fail, causing a breach of personal health information.  Furthermore, I acknowledge that it is my responsibility to provide information about my medical history, conditions and care that is complete and accurate to the best of my ability. I acknowledge that Practitioner's advice, recommendations, and/or decision may be based on factors not within their control, such as incomplete or inaccurate data provided by me or distortions of diagnostic images or specimens that may result from electronic transmissions. I understand that the practice of medicine is not an exact science and that Practitioner makes no warranties or guarantees regarding treatment outcomes. I acknowledge that a copy of this consent can be made available to me via my patient portal ([facility]), or I can request a printed copy by calling the office of [facility].    I understand that my insurance will be billed for this visit.   I have read or had this consent read to me. . I understand the contents of this consent, which adequately explains the benefits and risks of the Services being provided via telemedicine.  . I have been provided ample opportunity to ask questions regarding this consent and the Services and have had my questions answered to my satisfaction. . I give my informed consent for the services to be provided through the use of telemedicine in my medical care

## 2020-01-27 NOTE — Patient Instructions (Signed)
Medication Instructions:  Your physician has recommended you make the following change in your medication: 1) STOP taking Imdur  *If you need a refill on your cardiac medications before your next appointment, please call your pharmacy*  Follow-Up: At [facility], you and your health needs are our priority.  As part of our continuing mission to provide you with exceptional heart care, we have created designated Provider Care Teams.  These Care Teams include your primary Cardiologist (physician) and Advanced Practice Providers (APPs -  Physician Assistants and Nurse Practitioners) who all work together to provide you with the care you need, when you need it.  Your next appointment:   6 month(s)  The format for your next appointment:   In Person  Provider:   You may see [name], MD or one of the following Advanced Practice Providers on your designated Care Team:    Kathyrn Drown, NP

## 2020-02-02 ENCOUNTER — Ambulatory Visit: Payer: Medicare Other | Admitting: Podiatry

## 2020-02-08 ENCOUNTER — Telehealth: Payer: Self-pay | Admitting: *Deleted

## 2020-02-08 NOTE — Telephone Encounter (Signed)
   [facility] Pre-operative Risk Assessment    HEARTCARE STAFF: - Please ensure there is not already an duplicate clearance open for this procedure. - Under Visit Info/Reason for Call, type in Other and utilize the format Clearance MM/DD/YY or Clearance TBD. Do not use dashes or single digits. - If request is for dental extraction, please clarify the # of teeth to be extracted.  Request for surgical clearance:  1. What type of surgery is being performed? LEFT MIDDLE FINGER A1 PULLEY TRIGGER RELEASEE   2. When is this surgery scheduled? 03/17/20   3. What type of clearance is required (medical clearance vs. Pharmacy clearance to hold med vs. Both)? MEDICAL  4. Are there any medications that need to be held prior to surgery and how long? PLAVIX AND ASA   5. Practice name and name of physician performing surgery? [facility]; DR. Gwyndolyn Saxon [facility]   6. What is the office phone number? [phone]   7.   What is the office fax number? [place]  8.   Anesthesia type (None, local, MAC, general) ? NOT LISTED   Julaine Hua 02/08/2020, 5:45 PM  _________________________________________________________________   (provider comments below)

## 2020-02-09 NOTE — Telephone Encounter (Signed)
Dr. [name] Pt is s/p CABG with heart cath in 2019 showing patent grafts. We are asked to hold ASA and plavix for trigger finger surgery.  He is in a wheelchair and is unable to complete 4.0 METS and reports DOE. You saw him on 01/27/20.   Can you please provide guidance on clearance and holding ASA and plavix? Request does not list anesthesia used, but the patient reports the procedure will be completed with local anesthetic.

## 2020-02-11 ENCOUNTER — Encounter: Payer: Self-pay | Admitting: Podiatry

## 2020-02-11 ENCOUNTER — Other Ambulatory Visit: Payer: Self-pay

## 2020-02-11 ENCOUNTER — Ambulatory Visit: Payer: Medicare Other | Admitting: Podiatry

## 2020-02-11 DIAGNOSIS — D689 Coagulation defect, unspecified: Secondary | ICD-10-CM

## 2020-02-11 DIAGNOSIS — E1151 Type 2 diabetes mellitus with diabetic peripheral angiopathy without gangrene: Secondary | ICD-10-CM | POA: Diagnosis not present

## 2020-02-11 DIAGNOSIS — B351 Tinea unguium: Secondary | ICD-10-CM | POA: Diagnosis not present

## 2020-02-11 DIAGNOSIS — M79676 Pain in unspecified toe(s): Secondary | ICD-10-CM | POA: Diagnosis not present

## 2020-02-12 NOTE — Telephone Encounter (Signed)
OK to hold ASA and Plavix for 5 days prior to trigger finger surgery. Resume the following day. Understand that there is a small risk of possible heart attack when holding this medication.  [name], MD

## 2020-02-13 NOTE — Progress Notes (Signed)
He presents today chief complaint of painful forefoot bilateral.  States his toenails are long and need to be trimmed.  Denies any other new problems.  Objective: Vital signs are stable he is alert oriented x3 there is no erythema edema cellulitis drainage or odor toenails are long thick yellow dystrophic-like mycotic pulses remain minimally palpable.  Capillary fill time is immediate that his feet are cool to the touch.  Assessment: Pain in limb secondary to diabetic peripheral neuropathy and peripheral vascular disease.  Pain in limb secondary to onychomycosis.  Plan: Debridement of toenails bilaterally.  Follow-up with [place] as needed

## 2020-02-15 NOTE — Telephone Encounter (Signed)
   Primary Cardiologist: [name], MD  Chart revisited as part of pre-operative protocol coverage. Per Dr. [name], Hunter [name] would be at acceptable risk for the planned procedure without further cardiovascular testing. He states, "OK to hold ASA and Plavix for 5 days prior to trigger finger surgery. Resume the following day. Understand that there is a small risk of possible heart attack when holding this medication. "  I relayed this risk to the patient who verbalized understanding. The patient was advised that if he develops new symptoms prior to surgery to contact our office to arrange for a follow-up visit, and he verbalized understanding. I will route this recommendation to the requesting party via Epic fax function and remove from pre-op pool.  Please call with questions.  Charlie Pitter, PA-C 02/15/2020, 7:05 AM

## 2020-03-10 DIAGNOSIS — H6123 Impacted cerumen, bilateral: Secondary | ICD-10-CM | POA: Insufficient documentation

## 2020-03-10 DIAGNOSIS — H900 Conductive hearing loss, bilateral: Secondary | ICD-10-CM | POA: Insufficient documentation

## 2020-03-23 ENCOUNTER — Other Ambulatory Visit: Payer: Self-pay | Admitting: Cardiology

## 2020-05-12 ENCOUNTER — Telehealth: Payer: Self-pay | Admitting: Cardiology

## 2020-05-12 NOTE — Progress Notes (Signed)
Cardiology Office Note:    Date:  05/13/2020   ID:  [name], DOB 08/15/1950, MRN [id number]  PCP:  [name], MD   [facility] Providers Cardiologist:  [name], MD     Referring MD: [name], MD   Chief Complaint:  Chest Pain    Patient Profile:    Hunter [name] is a 70 y.o. male with:   Coronary artery disease   Inf STEMI in 2005 tx with PCI to RCA, S/p subsequent CABG  Cath in 2019: grafts patent  (HFpEF) heart failure with preserved ejection fraction   Diabetes mellitus   Peripheral arterial disease  COPD  GERD  Hypertension   Hyperlipidemia   OSA on CPAP  Prior CV studies: Echocardiogram 10/07/19 EF 55-60, no RWMA, GR 1 DD, normal RVSF, small pericardial effusion, AV sclerosis without stenosis  RIGHT/LEFT HEART CATH AND CORONARY/GRAFT ANGIOGRAPHY 09/06/2017 Narrative RCA prox to mid 70, dist 100; AM 75 LAD ost to prox 80, prox to mid 100 OM1 100; OM2 80, OM3 80 1. Severe 3 vessel obstructive CAD 2. Patent LIMA to the LAD 3. Patent SVG to the first diagonal 4. Patent SVG to the OM1 5. Patent SVG to the distal RCA 6. Normal LV filling pressures 7. Mild pulmonary HTN 8. Normal cardiac output Plan: angiographically there is no change compared to 2012. There is potential ischemia in the OM2 and OM3 distribution but these vessels are small and diffusely diseased and not suitable for PCI. This is also unchanged from prior angios. Consider pulmonary evaluation for his dyspnea.   Myocardial Perfusion Imaging 04/28/14 No ischemia, EF 50    History of Present Illness: Hunter [name] was last seen by Dr. [name] in 1/22.  He called in yesterday with chest pain relieved by NTG.  He is seen for further evaluation.  He is here today with his wife.  He has a history of chronic angina.  He previously was on isosorbide but had to stop due to headaches.  Isosorbide did control his symptoms.  Over the last 4 days, he has had recurrent  left-sided chest discomfort with associated radiation up into his neck.  This has mainly occurred in the evening after dinner when he gets into his recliner.  He is in a wheelchair due to a history of spinal cord tumor.  He he can stand if he holds onto something but is unable to walk.  He has been able to go out into his yard and dig holes to plant flowers while sitting in his wheelchair.  He does note increased fatigue.  He has had problems with his CPAP recently.  Standing to get his weight today is an effort for him.  He has not had chest discomfort with these types of exertion.  He has chronic shortness of breath.  He has not really noticed any worsening dyspnea.  He has not had any associated nausea or diaphoresis with this chest symptoms.  He sleeps on 3 pillows chronically.  He uses CPAP at night.  He has not had any significant increase in leg swelling.  He has not had syncope.        Past Medical History:  Diagnosis Date  . Bilateral lower extremity edema   . CAD (coronary artery disease) cardiologist-  dr [name]   a. Remote MI with stent in 2005;  b. CABG x 5 in 2005;  c. 09/2010 Cath: LM nl, LAD 70p, 90-21m D1 80p, LCX 369mOM1 100, OM2 90ost,  OM3 80, RCA 48m VG->PDA->RPL nl, VG->Diag nl, VG->OM1 nl, LIMA->LAD nl, EF 60%-->Med Rx.  . Chronic venous insufficiency    post vein harvest for CABG  . COPD (chronic obstructive pulmonary disease) ([place]   . Diabetes mellitus type 2, controlled ([place]   . Difficult intravenous access 06-16-2015   multiple attempt IV starts until use of scanner  . ED (erectile dysfunction) of organic origin   . Foot drop, bilateral   . GERD (gastroesophageal reflux disease)   . H/O spinal cord injury    a. more or less wheelchair bound. post mulitple back surgery's including resection spinal tumor  . History of acute myocardial infarction of inferior wall    02/ 2005 inferoposterior  w/ stenting to RCA  . 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 History of cellulitis    left lower leg-- now resolved  . History of ulcer of lower limb    venous statis  ---  resolved  . Hypertension   . Increased liver enzymes    due to alcohol use  . Left rotator cuff tear   . Myocardial infarction ([facility] 2005   with stent placement  . Nocturia   . OA (osteoarthritis)    left shoulder  . OAB (overactive bladder)   . OSA on CPAP   . PAD (peripheral artery disease) ([place]    followed by Dr. FOneida Alar . Peripheral arterial occlusive disease ([facility]    followed by dr fields  . 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 Type 2 diabetes mellitus ([place]   . Urinary incontinence, urge   . Wheelchair bound     Current Medications: Current Meds  Medication Sig  . albuterol (PROVENTIL) (2.5 MG/3ML) 0.083% nebulizer solution USE 1 VIAL IN NEBULIZER EVERY 6 HOURS - and as needed DX: J44.9  . amLODipine (NORVASC) 5 MG tablet Take 1 tablet (5 mg total) by mouth daily.  .Marland Kitchenaspirin 81 MG tablet Take 81 mg by mouth at bedtime.  . cetirizine (ZYRTEC) 10 MG tablet Take 10 mg by mouth daily.  . clopidogrel (PLAVIX) 75 MG tablet TAKE 1 TABLET BY MOUTH EVERY DAY  . doxazosin (CARDURA) 2 MG tablet TAKE 1 TABLET BY MOUTH EVERY DAY  . fluticasone (FLONASE) 50 MCG/ACT nasal spray Place 1 spray into both nostrils at bedtime as needed for allergies.   . furosemide (LASIX) 40 MG tablet TAKE 1 TABLET BY MOUTH TWICE A DAY  . gabapentin (NEURONTIN) 300 MG capsule Take 300 mg by mouth 2 (two) times daily.   .Marland KitchenHUMALOG KWIKPEN 100 UNIT/ML SOPN Inject 24 Units into the skin 3 (three) times daily.   .Marland Kitchenibuprofen (ADVIL,MOTRIN) 200 MG tablet Take 400 mg by mouth every 6 (six) hours as needed for headache or moderate pain.  .Marland Kitchenipratropium (ATROVENT) 0.06 % nasal spray Place 1 spray into both nostrils 3 (three) times daily.   .Marland Kitchenketotifen (ZADITOR) 0.025 % ophthalmic solution Place 2 drops as needed into both eyes (for allergies).   .Marland KitchenKLOR-CON M20  20 MEQ tablet TAKE 1 TABLET BY MOUTH EVERY DAY  . metoprolol tartrate (LOPRESSOR) 50 MG tablet TAKE 1 TABLET BY MOUTH TWICE A DAY  . montelukast (SINGULAIR) 10 MG tablet TAKE 1 TABLET BY MOUTH EVERYDAY AT BEDTIME  . nitroGLYCERIN (NITROSTAT) 0.4 MG SL tablet Place 1 tablet (0.4 mg total) under the tongue every 5 (five) minutes as  needed for chest pain (chest pain).  . NOVOFINE 32G X 6 MM MISC USE ONCE DAILY WITH LEVEMIR PEN  . omeprazole (PRILOSEC) 40 MG capsule Take 40 mg by mouth 2 (two) times daily.  [name] VERIO test strip USE TO SELF MONITOR BLOOD GLUCOSE UP TO 4 TIMES DAILY (E11.65)  . oxybutynin (DITROPAN XL) 15 MG 24 hr tablet Take 15 mg by mouth at bedtime.  [name] OZEMPIC, 0.25 OR 0.5 MG/DOSE, 2 MG/1.5ML SOPN Inject 0.5 mg into the skin once a week.  . pravastatin (PRAVACHOL) 40 MG tablet Take 40 mg by mouth daily.  [name] PROAIR HFA 108 (90 Base) MCG/ACT inhaler INHALE 2-4 PUFFS UP TO 4 TIMES DAILY AS NEEDED FOR WHEEZING  . ranolazine (RANEXA) 500 MG 12 hr tablet Take 1 tablet (500 mg total) by mouth 2 (two) times daily.  [name] Respiratory Therapy Supplies (FLUTTER) DEVI Use as directed  . sodium chloride HYPERTONIC 3 % nebulizer solution 4 mL inhaled via nebulizer twice daily.  . tamsulosin (FLOMAX) 0.4 MG CAPS capsule Take 0.4 mg by mouth daily.  . traMADol (ULTRAM) 50 MG tablet Take 1 tablet (50 mg total) 3 (three) times daily as needed by mouth for moderate pain.  . TRESIBA FLEXTOUCH 200 UNIT/ML SOPN Inject 46 Units into the skin daily.  [name] triamterene-hydrochlorothiazide (MAXZIDE-25) 37.5-25 MG per tablet Take 1 tablet by mouth daily.   . Vitamin D, Ergocalciferol, (DRISDOL) 50000 units CAPS capsule Take 50,000 Units by mouth every Wednesday.      Allergies:   Codeine, Lisinopril, Ramipril, and Tape   Social History   Tobacco Use  . Smoking status: Former Smoker    Packs/day: 0.50    Years: 40.00    Pack years: 20.00    Types: Cigarettes    Quit date: 01/01/2009    Years since  quitting: 11.3  . Smokeless tobacco: Never Used  . Tobacco comment: as of 06-09-2015 per pt last month lite smoker  Vaping Use  . Vaping Use: Never used  Substance Use Topics  . Alcohol use: No    Alcohol/week: 0.0 standard drinks  . Drug use: No     Family Hx: The patient's family history includes COPD in his sister; Colon cancer in his mother; Coronary artery disease in his brother and sister; Diabetes in his brother, mother, and sister; Heart attack in his brother, mother, and sister; Heart disease in his brother, father, mother, and sister; Lung cancer in his sister; Other in his father; Suicidality in his brother.  Review of Systems  Constitutional: Negative for fever.  Respiratory: Negative for cough and wheezing.   Gastrointestinal: Negative for diarrhea, hematochezia, melena and vomiting.  Genitourinary: Negative for hematuria.     EKGs/Labs/Other Test Reviewed:    EKG:  EKG is   ordered today.  The ekg ordered today demonstrates normal sinus rhythm, heart rate 70, normal axis, T wave inversions, V6, QTc 414, low voltage, poor R wave progression, no change when compared to prior EKG dated 09/04/2018  Recent Labs: No results found for requested labs within last 8760 hours.   Recent Lipid Panel Lab Results  Component Value Date/Time   CHOL 105 03/31/2010 09:10 AM   TRIG 97.0 03/31/2010 09:10 AM   HDL 26.00 (L) 03/31/2010 09:10 AM   CHOLHDL 4 03/31/2010 09:10 AM   LDLCALC 60 03/31/2010 09:10 AM   Labs obtained through [place] - personally reviewed and interpreted: 03/10/2020: A1c 6.8 09/09/2019: Total cholesterol 148, HDL 35, LDL 81, triglycerides 160,  K+ 4.3, ALT 17 11/19/2017: Hgb 13.4   Risk Assessment/Calculations:      Physical Exam:    VS:  BP (!) 124/50   Ht '5\' 6"'  (1.676 m)   Wt 231 lb 9.6 oz (105.1 kg)   SpO2 97%   BMI 37.38 kg/m     Wt Readings from Last 3 Encounters:  05/13/20 231 lb 9.6 oz (105.1 kg)  09/23/19 235 lb (106.6 kg)  05/08/19 230 lb  (104.3 kg)     Constitutional:      Appearance: Healthy appearance. Not in distress.  Neck:     Thyroid: No thyromegaly.     Vascular: JVD normal.  Pulmonary:     Effort: Pulmonary effort is normal.     Breath sounds: No wheezing. No rales.  Cardiovascular:     Normal rate. Regular rhythm. Normal S1. Normal S2.     Murmurs: There is no murmur.  Abdominal:     Palpations: Abdomen is soft. There is no hepatomegaly.  Skin:    General: Skin is warm and dry.  Neurological:     General: No focal deficit present.     Mental Status: Alert and oriented to person, place and time.     Cranial Nerves: Cranial nerves are intact.         ASSESSMENT & PLAN:    1. Coronary artery disease involving native coronary artery of native heart with angina pectoris ([place]) 2. Precordial chest pain History of inferior MI in 2005 treated with PCI to the RCA and subsequent CABG.  Cardiac catheterization in 2019 demonstrated patent grafts.  He did have disease in OM2 and OM3 that could be contributing to angina.  However, these are small vessels and not suitable for PCI.  Medical therapy has been continued.  His current symptoms are similar to his chronic angina.  He previously had control with nitrates but could not tolerate due to headaches.  He has had recurrent symptoms over the past several nights requiring nitroglycerin for relief.  His electrocardiogram does not demonstrate any changes from prior tracing in 2020.  He is significantly limited because of his back issues.  He has difficulty standing and is unable to walk.  However, he has done some exertional activities without chest symptoms.  He is already on amlodipine, metoprolol.  I have recommended that we place him on ranolazine and proceed with stress testing.  If he has worsening symptoms, his Myoview is high risk or he has continued symptoms despite 3 antianginals, we will need to consider cardiac catheterization.    -CMET, CBC, BNP  -Lexiscan  Myoview  -Ranolazine 500 mg twice daily (will get f/[facility] EKG at [facility])  -F/u 3-4 week with Dr. [name] or APP  -Return sooner if needed  3. Chronic heart failure with preserved ejection fraction (HCC) EF 55-60 by most recent echocardiogram.  His weights are stable.  His lungs are clear and he does not really have any significant edema on exam.  He has chronic shortness of breath.  I will obtain a CMET, BNP today.  If his BNP is elevated, I will adjust his furosemide.  4. Essential hypertension The patient's blood pressure is controlled on his current regimen.  Continue current therapy.    5. Mixed hyperlipidemia LDL in 9/21 was 81.  He is fasting today.  Check Lipids.  If LDL > 70, increase Pravastatin to 80 mg.    Shared Decision Making/Informed Consent The risks [chest pain, shortness of breath, cardiac arrhythmias, dizziness,  blood pressure fluctuations, myocardial infarction, stroke/transient ischemic attack, nausea, vomiting, allergic reaction, radiation exposure, metallic taste sensation and life-threatening complications (estimated to be 1 in 10,000)], benefits (risk stratification, diagnosing coronary artery disease, treatment guidance) and alternatives of a nuclear stress test were discussed in detail with Mr. Allum and he agrees to proceed.       Dispo:  No follow-ups on file.   Medication Adjustments/Labs and Tests Ordered: Current medicines are reviewed at length with the patient today.  Concerns regarding medicines are outlined above.  Tests Ordered: Orders Placed This Encounter  Procedures  . Comp Met (CMET)  . Lipid panel  . Pro b natriuretic peptide (BNP)  . CBC  . Cardiac Stress Test: Informed Consent Details: Physician/Practitioner Attestation; Transcribe to consent form and obtain patient signature  . MYOCARDIAL PERFUSION IMAGING  . EKG 12-Lead   Medication Changes: Meds ordered this encounter  Medications  . ranolazine (RANEXA) 500 MG 12 hr tablet     Sig: Take 1 tablet (500 mg total) by mouth 2 (two) times daily.    Dispense:  180 tablet    Refill:  3    Signed, Richardson Dopp, PA-C  05/13/2020 9:04 AM    [address], [place], [place]  [address] Phone: [phone]; Fax: [phone]

## 2020-05-12 NOTE — Telephone Encounter (Signed)
Spoke with pt's wife, DPR who reports pt has had intermittent CP with pressure in his neck and ears for 2 weeks worse in the last 4 days.  Pt intimally thought it was related to heartburn or stomach issues and took TUMS without resolution but has taken nitro for the last 4 evenings. Nitro did resolve the CP.   Pt is not currently having active CP or additional symptoms at this time.  Pt is taking medications as prescribed.  Appointment scheduled with Richardson Dopp, PA-C for 05/13/2020 at 8:15am.  Pt's wife advised if pt develops active CP again d/t history to not wait and to call EMS or have pt seek care in ED immediately.  Pt's wife verbalizes understanding and agrees with current plan.

## 2020-05-12 NOTE — Telephone Encounter (Signed)
Pt c/o of Chest Pain: STAT if CP now or developed within 24 hours  1. Are you having CP right now? No   2. Are you experiencing any other symptoms (ex. SOB, nausea, vomiting, sweating)? SOB, pressure in neck and ears  3. How long have you been experiencing CP? About 2 weeks  4. Is your CP continuous or coming and going? Coming and going   5. Have you taken Nitroglycerin? Yes. Patient's wife states the patient has had to take nitro the past 4 nights  Pt c/o Shortness Of Breath: STAT if SOB developed within the last 24 hours or pt is noticeably SOB on the phone  1. Are you currently SOB (can you hear that pt is SOB on the phone)? No   2. How long have you been experiencing SOB?  Patient's wife states the patient is always SOB due to COPD. He also has to wear a CPAP machine. She states she is unsure whether or not the chest pain has effected his usual SOB.  3. Are you SOB when sitting or when up moving around? Both   4. Are you currently experiencing any other symptoms? No   ?

## 2020-05-13 ENCOUNTER — Encounter: Payer: Self-pay | Admitting: Physician Assistant

## 2020-05-13 ENCOUNTER — Ambulatory Visit: Payer: Medicare Other | Admitting: Physician Assistant

## 2020-05-13 ENCOUNTER — Other Ambulatory Visit: Payer: Self-pay

## 2020-05-13 VITALS — BP 124/50 | Ht 66.0 in | Wt 231.6 lb

## 2020-05-13 DIAGNOSIS — I10 Essential (primary) hypertension: Secondary | ICD-10-CM | POA: Diagnosis not present

## 2020-05-13 DIAGNOSIS — E782 Mixed hyperlipidemia: Secondary | ICD-10-CM

## 2020-05-13 DIAGNOSIS — I5032 Chronic diastolic (congestive) heart failure: Secondary | ICD-10-CM | POA: Diagnosis not present

## 2020-05-13 DIAGNOSIS — R072 Precordial pain: Secondary | ICD-10-CM

## 2020-05-13 DIAGNOSIS — I25119 Atherosclerotic heart disease of native coronary artery with unspecified angina pectoris: Secondary | ICD-10-CM

## 2020-05-13 MED ORDER — RANOLAZINE ER 500 MG PO TB12: 500.0000 mg | ORAL_TABLET | Freq: Two times a day (BID) | ORAL | 3 refills | Status: DC

## 2020-05-13 NOTE — Patient Instructions (Addendum)
Medication Instructions:  Your physician has recommended you make the following change in your medication:  1.  START Ranexa 500 mg taking 1 tablet twice a day   *If you need a refill on your cardiac medications before your next appointment, please call your pharmacy*   Lab Work: TODAY:  CMET, LIPID, PRO BNP, & CBC  If you have labs (blood work) drawn today and your tests are completely normal, you will receive your results only by: [name] MyChart Message (if you have MyChart) OR . A paper copy in the mail If you have any lab test that is abnormal or we need to change your treatment, we will call you to review the results.   Testing/Procedures: Your physician has requested that you have a lexiscan myoview. For further information please visit [email or web address]. Please follow instruction sheet, BELOW:     You are scheduled for a Myocardial Perfusion Imaging Study on 05/18/2020, ARRIVE AT 7:45.    The test will take approximately 3 to 4 hours to complete; you may bring reading material.  If someone comes with you to your appointment, they will need to remain in the main lobby due to limited space in the testing area. **If you are pregnant or breastfeeding, please notify the nuclear lab prior to your appointment**  How to prepare for your Myocardial Perfusion Test: . Do not eat or drink 3 hours prior to your test, except you may have water. . Do not consume products containing caffeine (regular or decaffeinated) 12 hours prior to your test. (ex: coffee, chocolate, sodas, tea). Do bring a list of your current medications with you.  If not listed below, you may take your medications as normal. . Do not take any DIABETIC medication on the morning of your test until after the study. . Do wear comfortable clothes (no dresses or overalls) and walking shoes, tennis shoes preferred (No heels or open toe shoes are allowed). . Do NOT wear cologne, perfume, aftershave, or lotions (deodorant is  allowed). . If these instructions are not followed, your test will have to be rescheduled.    Follow-Up: At [facility], you and your health needs are our priority.  As part of our continuing mission to provide you with exceptional heart care, we have created designated Provider Care Teams.  These Care Teams include your primary Cardiologist (physician) and Advanced Practice Providers (APPs -  Physician Assistants and Nurse Practitioners) who all work together to provide you with the care you need, when you need it.  We recommend signing up for the patient portal called "MyChart".  Sign up information is provided on this After Visit Summary.  MyChart is used to connect with patients for Virtual Visits (Telemedicine).  Patients are able to view lab/test results, encounter notes, upcoming appointments, etc.  Non-urgent messages can be sent to your provider as well.   To learn more about what you can do with MyChart, go to [email or web address].    Your next appointment:   3 week(s)   06/01/2020 ARRIVE AT 1:35   The format for your next appointment:   In Person  Provider:   You may see [name], MD or one of the following Advanced Practice Providers on your designated Care Team:    Kathyrn Drown, NP    Other Instructions

## 2020-05-14 LAB — LIPID PANEL
Chol/HDL Ratio: 3.8 ratio (ref 0.0–5.0)
Cholesterol, Total: 147 mg/dL (ref 100–199)
HDL: 39 mg/dL — ABNORMAL LOW (ref >39)
LDL Chol Calc (NIH): 83 mg/dL (ref 0–99)
Triglycerides: 141 mg/dL (ref 0–149)
VLDL Cholesterol Cal: 25 mg/dL (ref 5–40)

## 2020-05-14 LAB — PRO B NATRIURETIC PEPTIDE: NT-Pro BNP: 321 pg/mL (ref 0–376)

## 2020-05-14 LAB — CBC
Hematocrit: 39.1 % (ref 37.5–51.0)
Hemoglobin: 12.4 g/dL — ABNORMAL LOW (ref 13.0–17.7)
MCH: 28.8 pg (ref 26.6–33.0)
MCHC: 31.7 g/dL (ref 31.5–35.7)
MCV: 91 fL (ref 79–97)
Platelets: 215 10*3/uL (ref 150–450)
RBC: 4.3 x10E6/uL (ref 4.14–5.80)
RDW: 13.1 % (ref 11.6–15.4)
WBC: 8.8 10*3/uL (ref 3.4–10.8)

## 2020-05-14 LAB — COMPREHENSIVE METABOLIC PANEL
ALT: 18 IU/L (ref 0–44)
AST: 13 IU/L (ref 0–40)
Albumin/Globulin Ratio: 1.5 (ref 1.2–2.2)
Albumin: 4.1 g/dL (ref 3.8–4.8)
Alkaline Phosphatase: 54 IU/L (ref 44–121)
BUN/Creatinine Ratio: 15 (ref 10–24)
BUN: 15 mg/dL (ref 8–27)
Bilirubin Total: 0.2 mg/dL (ref 0.0–1.2)
CO2: 24 mmol/L (ref 20–29)
Calcium: 9.5 mg/dL (ref 8.6–10.2)
Chloride: 99 mmol/L (ref 96–106)
Creatinine, Ser: 1.02 mg/dL (ref 0.76–1.27)
Globulin, Total: 2.7 g/dL (ref 1.5–4.5)
Glucose: 157 mg/dL — ABNORMAL HIGH (ref 65–99)
Potassium: 4.2 mmol/L (ref 3.5–5.2)
Sodium: 139 mmol/L (ref 134–144)
Total Protein: 6.8 g/dL (ref 6.0–8.5)
eGFR: 80 mL/min/{1.73_m2} (ref >59)

## 2020-05-16 ENCOUNTER — Telehealth (HOSPITAL_COMMUNITY): Payer: Self-pay | Admitting: *Deleted

## 2020-05-16 ENCOUNTER — Telehealth: Payer: Self-pay | Admitting: *Deleted

## 2020-05-16 DIAGNOSIS — E782 Mixed hyperlipidemia: Secondary | ICD-10-CM

## 2020-05-16 DIAGNOSIS — I25119 Atherosclerotic heart disease of native coronary artery with unspecified angina pectoris: Secondary | ICD-10-CM

## 2020-05-16 MED ORDER — PRAVASTATIN SODIUM 80 MG PO TABS: 80.0000 mg | ORAL_TABLET | Freq: Every evening | ORAL | 3 refills | Status: DC

## 2020-05-16 NOTE — Telephone Encounter (Signed)
-----   Message from Liliane Shi, [place] sent at 05/15/2020  8:44 PM EDT ----- Creatinine, K+, LFTs normal. LDL above goal.  Goal is < 70.  BNP normal.  Hgb stable.  PLAN:  -Increase Pravastatin to 80 mg daily at bedtime. -Fasting Lipids and LFTs in 3 mos Richardson Dopp, [place]    05/15/2020 8:39 PM

## 2020-05-16 NOTE — Telephone Encounter (Signed)
Patient given detailed instructions per Myocardial Perfusion Study Information Sheet for the test on 05/18/20  Patient notified to arrive 15 minutes early and that it is imperative to arrive on time for appointment to keep from having the test rescheduled.  If you need to cancel or reschedule your appointment, please call the office within 24 hours of your appointment. . Patient verbalized understanding.  Hunter [name] [name]    

## 2020-05-16 NOTE — Telephone Encounter (Signed)
Pt advised of lab results and recommendations by phone with verbal understanding. Pt agreeable to increase Pravastatin to 80 mg daily, fasting labs 08/15/20. Pt will double his Pravastatin 40 mg tablet for now. Med list reflects the dose change to Pravastatin 80 mg once a day. Orders and lab appt have been placed. Patient notified of result.  Please refer to phone note from today for complete details.   Julaine Hua, [place] 05/16/2020 11:05 AM

## 2020-05-18 ENCOUNTER — Other Ambulatory Visit: Payer: Self-pay

## 2020-05-18 ENCOUNTER — Ambulatory Visit (HOSPITAL_COMMUNITY): Payer: Medicare Other | Attending: Cardiovascular Disease

## 2020-05-18 DIAGNOSIS — E782 Mixed hyperlipidemia: Secondary | ICD-10-CM | POA: Diagnosis present

## 2020-05-18 DIAGNOSIS — I5032 Chronic diastolic (congestive) heart failure: Secondary | ICD-10-CM

## 2020-05-18 DIAGNOSIS — R072 Precordial pain: Secondary | ICD-10-CM | POA: Diagnosis present

## 2020-05-18 DIAGNOSIS — I25119 Atherosclerotic heart disease of native coronary artery with unspecified angina pectoris: Secondary | ICD-10-CM | POA: Diagnosis present

## 2020-05-18 DIAGNOSIS — I10 Essential (primary) hypertension: Secondary | ICD-10-CM

## 2020-05-18 LAB — MYOCARDIAL PERFUSION IMAGING
LV dias vol: 82 mL (ref 62–150)
LV sys vol: 44 mL
Peak HR: 81 {beats}/min
Rest HR: 74 {beats}/min
SDS: 3
SRS: 2
SSS: 5
TID: 1.07

## 2020-05-18 MED ORDER — TECHNETIUM TC 99M TETROFOSMIN IV KIT
10.9000 | PACK | Freq: Once | INTRAVENOUS | Status: AC | PRN
Start: 2020-05-18 — End: 2020-05-18
  Administered 2020-05-18: 10.9 via INTRAVENOUS
  Filled 2020-05-18: qty 11

## 2020-05-18 MED ORDER — TECHNETIUM TC 99M TETROFOSMIN IV KIT
32.4000 | PACK | Freq: Once | INTRAVENOUS | Status: AC | PRN
  Administered 2020-05-18: 32.4 via INTRAVENOUS
  Filled 2020-05-18: qty 33

## 2020-05-18 MED ORDER — REGADENOSON 0.4 MG/5ML IV SOLN
0.4000 mg | Freq: Once | INTRAVENOUS | Status: AC
  Administered 2020-05-18: 0.4 mg via INTRAVENOUS

## 2020-05-19 ENCOUNTER — Encounter: Payer: Self-pay | Admitting: Physician Assistant

## 2020-05-19 ENCOUNTER — Telehealth: Payer: Self-pay

## 2020-05-19 DIAGNOSIS — I25119 Atherosclerotic heart disease of native coronary artery with unspecified angina pectoris: Secondary | ICD-10-CM

## 2020-05-19 DIAGNOSIS — I259 Chronic ischemic heart disease, unspecified: Secondary | ICD-10-CM

## 2020-05-19 NOTE — Telephone Encounter (Signed)
-----   Message from Liliane Shi, [place] sent at 05/19/2020  9:25 AM EDT ----- Stress test shows evidence of his old heart attack but no ischemia.  The EF is mildly low but stress tests can often underestimate the EF.  He has f/u with Dr. [name] 6/1. PLAN:  -Please see if we can get a f/u echocardiogram to confirm his EF prior to his appt with Dr. [name] on 6/1 -Keep f/u with Dr. [name] as planned.  -Continue current meds  -I will also fwd to Dr. [name] as Juluis Rainier -Please send copy to PCP  Richardson Dopp, PA-C    05/19/2020 9:13 AM

## 2020-05-19 NOTE — Telephone Encounter (Signed)
Reviewed Myoview results and recommendations with pt. He will await a call from scheduling to schedule his echo.

## 2020-05-26 ENCOUNTER — Ambulatory Visit (HOSPITAL_COMMUNITY)
Admission: RE | Admit: 2020-05-26 | Discharge: 2020-05-26 | Disposition: A | Discharge status: Home / Self Care | Payer: Medicare Other | Source: Ambulatory Visit | Attending: Physician Assistant | Admitting: Physician Assistant

## 2020-05-26 ENCOUNTER — Other Ambulatory Visit: Payer: Self-pay

## 2020-05-26 DIAGNOSIS — I25119 Atherosclerotic heart disease of native coronary artery with unspecified angina pectoris: Secondary | ICD-10-CM | POA: Insufficient documentation

## 2020-05-26 DIAGNOSIS — R079 Chest pain, unspecified: Secondary | ICD-10-CM | POA: Diagnosis not present

## 2020-05-26 DIAGNOSIS — Z951 Presence of aortocoronary bypass graft: Secondary | ICD-10-CM | POA: Insufficient documentation

## 2020-05-26 DIAGNOSIS — I259 Chronic ischemic heart disease, unspecified: Secondary | ICD-10-CM | POA: Insufficient documentation

## 2020-05-26 DIAGNOSIS — I10 Essential (primary) hypertension: Secondary | ICD-10-CM | POA: Insufficient documentation

## 2020-05-26 DIAGNOSIS — E119 Type 2 diabetes mellitus without complications: Secondary | ICD-10-CM | POA: Insufficient documentation

## 2020-05-26 DIAGNOSIS — I252 Old myocardial infarction: Secondary | ICD-10-CM | POA: Insufficient documentation

## 2020-05-26 LAB — ECHOCARDIOGRAM COMPLETE
Area-P 1/2: 3.42 cm2
S' Lateral: 3.3 cm

## 2020-05-26 MED ORDER — PERFLUTREN LIPID MICROSPHERE
1.0000 mL | INTRAVENOUS | Status: AC | PRN
  Administered 2020-05-26: 2 mL via INTRAVENOUS
  Filled 2020-05-26: qty 10

## 2020-05-26 NOTE — Progress Notes (Signed)
  Echocardiogram 2D Echocardiogram has been performed.  Jennette Dubin 05/26/2020, 11:08 AM

## 2020-05-27 ENCOUNTER — Encounter: Payer: Self-pay | Admitting: Physician Assistant

## 2020-05-27 DIAGNOSIS — I5030 Unspecified diastolic (congestive) heart failure: Secondary | ICD-10-CM | POA: Insufficient documentation

## 2020-05-31 ENCOUNTER — Ambulatory Visit: Payer: Medicare Other | Admitting: Podiatry

## 2020-05-31 ENCOUNTER — Encounter: Payer: Self-pay | Admitting: Podiatry

## 2020-05-31 ENCOUNTER — Other Ambulatory Visit: Payer: Self-pay

## 2020-05-31 DIAGNOSIS — E1151 Type 2 diabetes mellitus with diabetic peripheral angiopathy without gangrene: Secondary | ICD-10-CM | POA: Diagnosis not present

## 2020-05-31 DIAGNOSIS — M79676 Pain in unspecified toe(s): Secondary | ICD-10-CM

## 2020-05-31 DIAGNOSIS — B351 Tinea unguium: Secondary | ICD-10-CM

## 2020-05-31 DIAGNOSIS — D689 Coagulation defect, unspecified: Secondary | ICD-10-CM | POA: Diagnosis not present

## 2020-05-31 NOTE — Progress Notes (Signed)
He presents today chief complaint of painful elongated toenails.  Objective: Vital signs are stable alert oriented x3 pulses are palpable no open lesions or wounds.  Toenails are thick yellow dystrophic-like mycotic.  Assessment: Pain in limb secondary to onychomycosis.  Plan: Debridement of toenails 1 through 5 bilateral.

## 2020-06-01 ENCOUNTER — Ambulatory Visit: Payer: Medicare Other | Admitting: Cardiology

## 2020-06-02 ENCOUNTER — Ambulatory Visit: Payer: Medicare Other
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
## 2020-06-06 ENCOUNTER — Other Ambulatory Visit: Payer: Self-pay

## 2020-06-06 ENCOUNTER — Ambulatory Visit: Payer: Medicare Other | Admitting: Family

## 2020-06-06 ENCOUNTER — Encounter: Payer: Self-pay | Admitting: Family

## 2020-06-06 VITALS — BP 150/72 | HR 70 | Ht 66.0 in | Wt 232.2 lb

## 2020-06-06 DIAGNOSIS — I5032 Chronic diastolic (congestive) heart failure: Secondary | ICD-10-CM

## 2020-06-06 DIAGNOSIS — I25118 Atherosclerotic heart disease of native coronary artery with other forms of angina pectoris: Secondary | ICD-10-CM

## 2020-06-06 DIAGNOSIS — K219 Gastro-esophageal reflux disease without esophagitis: Secondary | ICD-10-CM

## 2020-06-06 DIAGNOSIS — I10 Essential (primary) hypertension: Secondary | ICD-10-CM

## 2020-06-06 DIAGNOSIS — E785 Hyperlipidemia, unspecified: Secondary | ICD-10-CM | POA: Diagnosis not present

## 2020-06-06 MED ORDER — NITROGLYCERIN 0.4 MG SL SUBL: 0.4000 mg | SUBLINGUAL_TABLET | SUBLINGUAL | 5 refills | Status: DC | PRN

## 2020-06-06 NOTE — Patient Instructions (Addendum)
Medication Instructions:   Your physician has recommended you make the following change in your medication:   STOP Ranolazine  We refilled your Nitroglycerin  If your acid reflux improves with sitting up after eating we may consider changing to your Omeprazole to Pantoprazole.  *If you need a refill on your cardiac medications before your next appointment, please call your pharmacy*   Lab Work:  If you have labs (blood work) drawn today and your tests are completely normal, you will receive your results only by: [name] MyChart Message (if you have MyChart) OR . A paper copy in the mail If you have any lab test that is abnormal or we need to change your treatment, we will call you to review the results.   Testing/Procedures: Your stress test showed old heart attack but no new blockages.   Your echocardiogram showed normal heart pumping function and that your heart is moderately stiff. Your fluid pill helps to prevent your heart from hanging onto extra fluid.   Follow-Up: At [facility], you and your health needs are our priority.  As part of our continuing mission to provide you with exceptional heart care, we have created designated Provider Care Teams.  These Care Teams include your primary Cardiologist (physician) and Advanced Practice Providers (APPs -  Physician Assistants and Nurse Practitioners) who all work together to provide you with the care you need, when you need it.  We recommend signing up for the patient portal called "MyChart".  Sign up information is provided on this After Visit Summary.  MyChart is used to connect with patients for Virtual Visits (Telemedicine).  Patients are able to view lab/test results, encounter notes, upcoming appointments, etc.  Non-urgent messages can be sent to your provider as well.   To learn more about what you can do with MyChart, go to [email or web address].    Your next appointment:   As scheduled with Melina Copa, PA.   AND  In 6  months with Dr. [name] on Monday, December 12 @ 10:40 am.  Other Instructions  Try to sit up up at least 30 minutes after eating to see if it helps with your symptoms.   Heart Healthy Diet Recommendations: A low-salt diet is recommended. Meats should be grilled, baked, or boiled. Avoid fried foods. Focus on lean protein sources like fish or chicken with vegetables and fruits. The American Heart Association is a Microbiologist!  American Heart Association Diet and Lifeystyle Recommendations

## 2020-06-14 ENCOUNTER — Telehealth: Payer: Self-pay | Admitting: Pulmonary Disease

## 2020-06-14 DIAGNOSIS — J449 Chronic obstructive pulmonary disease, unspecified: Secondary | ICD-10-CM

## 2020-06-14 NOTE — Telephone Encounter (Signed)
Spoke with the pt  He is out of mouthpieces for neb  He scheduled appt for 07/05/20  Order sent for supplies and he was advised needs to be sure and keep next appt  Nothing further needed

## 2020-06-17 ENCOUNTER — Telehealth: Payer: Self-pay | Admitting: Pulmonary Disease

## 2020-06-17 NOTE — Telephone Encounter (Signed)
Left message for patient to call back  

## 2020-06-21 DIAGNOSIS — M5136 Other intervertebral disc degeneration, lumbar region: Secondary | ICD-10-CM | POA: Insufficient documentation

## 2020-06-21 DIAGNOSIS — M5416 Radiculopathy, lumbar region: Secondary | ICD-10-CM | POA: Insufficient documentation

## 2020-06-22 NOTE — Telephone Encounter (Signed)
Spoke with the Hunter [name] Advised will need sleep cons as rec back in 2020 when here for last ov  He states that Dr Sharlett Iles originally ordered his machine, and he has seen him recently, so will call him to see if he can order  Hunter [name] did schedule ov with [facility] for pulm f/u  He did not want sleep consult set up at this time  Nothing further needed

## 2020-06-22 NOTE — Telephone Encounter (Signed)
Pt returning a phone call. Pt can be reached at [phone].

## 2020-06-28 ENCOUNTER — Other Ambulatory Visit: Payer: Self-pay

## 2020-06-28 DIAGNOSIS — E782 Mixed hyperlipidemia: Secondary | ICD-10-CM

## 2020-06-28 DIAGNOSIS — I25119 Atherosclerotic heart disease of native coronary artery with unspecified angina pectoris: Secondary | ICD-10-CM

## 2020-06-28 MED ORDER — PRAVASTATIN SODIUM 80 MG PO TABS: 80.0000 mg | ORAL_TABLET | Freq: Every evening | ORAL | 3 refills | Status: DC

## 2020-07-27 ENCOUNTER — Ambulatory Visit: Payer: Medicare Other | Admitting: Pulmonary Disease

## 2020-07-27 ENCOUNTER — Other Ambulatory Visit: Payer: Self-pay

## 2020-07-27 VITALS — BP 128/70 | HR 78 | Ht 66.0 in | Wt 233.8 lb

## 2020-07-27 DIAGNOSIS — R0609 Other forms of dyspnea: Secondary | ICD-10-CM

## 2020-07-27 DIAGNOSIS — J398 Other specified diseases of upper respiratory tract: Secondary | ICD-10-CM

## 2020-07-27 DIAGNOSIS — R0600 Dyspnea, unspecified: Secondary | ICD-10-CM | POA: Diagnosis not present

## 2020-07-27 DIAGNOSIS — J449 Chronic obstructive pulmonary disease, unspecified: Secondary | ICD-10-CM | POA: Diagnosis not present

## 2020-07-27 DIAGNOSIS — J9611 Chronic respiratory failure with hypoxia: Secondary | ICD-10-CM

## 2020-07-27 DIAGNOSIS — Z9981 Dependence on supplemental oxygen: Secondary | ICD-10-CM

## 2020-07-27 MED ORDER — BREZTRI AEROSPHERE 160-9-4.8 MCG/ACT IN AERO
2.0000 | INHALATION_SPRAY | Freq: Two times a day (BID) | RESPIRATORY_TRACT | 0 refills | Status: AC
Start: 2020-07-27 — End: 2020-08-26

## 2020-07-27 NOTE — Addendum Note (Signed)
Addended by: Garner Nash on: 07/27/2020 09:04 PM   Modules accepted: Level of Service

## 2020-07-27 NOTE — Patient Instructions (Signed)
Thank you for visiting Dr. Valeta Harms at [facility]. Today we recommend the following:  POC walk today  DME orders for POC and new nebulizer  Breztri samples + prescription   Keep follow up with sleep doc Keep compliance with CPAP machine   Return in about 6 months (around 01/27/2021) for with APP or Dr. Valeta Harms.    Please do your part to reduce the spread of COVID-19.
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
## 2020-08-09 ENCOUNTER — Ambulatory Visit: Payer: Medicare Other | Admitting: Physician Assistant

## 2020-08-09 ENCOUNTER — Encounter: Payer: Self-pay | Admitting: Physician Assistant

## 2020-08-09 ENCOUNTER — Other Ambulatory Visit: Payer: Self-pay

## 2020-08-09 VITALS — BP 138/68 | HR 74 | Ht 66.0 in | Wt 230.6 lb

## 2020-08-09 DIAGNOSIS — E785 Hyperlipidemia, unspecified: Secondary | ICD-10-CM

## 2020-08-09 DIAGNOSIS — I2510 Atherosclerotic heart disease of native coronary artery without angina pectoris: Secondary | ICD-10-CM | POA: Diagnosis not present

## 2020-08-09 DIAGNOSIS — I5032 Chronic diastolic (congestive) heart failure: Secondary | ICD-10-CM

## 2020-08-09 DIAGNOSIS — I10 Essential (primary) hypertension: Secondary | ICD-10-CM

## 2020-08-09 NOTE — Patient Instructions (Addendum)
Medication Instructions:  Your physician recommends that you continue on your current medications as directed. Please refer to the Current Medication list given to you today.  *If you need a refill on your cardiac medications before your next appointment, please call your pharmacy*   Lab Work: None ordered.  We cancelled your lab appointment so you can get your lab work done at your PCP's office.   If you have labs (blood work) drawn today and your tests are completely normal, you will receive your results only by: [place] (if you have MyChart) OR A paper copy in the mail If you have any lab test that is abnormal or we need to change your treatment, we will call you to review the results.   Testing/Procedures: None ordered   Follow-Up: At [facility], you and your health needs are our priority.  As part of our continuing mission to provide you with exceptional heart care, we have created designated Provider Care Teams.  These Care Teams include your primary Cardiologist (physician) and Advanced Practice Providers (APPs -  Physician Assistants and Nurse Practitioners) who all work together to provide you with the care you need, when you need it.  We recommend signing up for the patient portal called "MyChart".  Sign up information is provided on this After Visit Summary.  MyChart is used to connect with patients for Virtual Visits (Telemedicine).  Patients are able to view lab/test results, encounter notes, upcoming appointments, etc.  Non-urgent messages can be sent to your provider as well.   To learn more about what you can do with MyChart, go to [email or web address].    Your next appointment:   12/12/2020 arrive at 10:25 to see Dr. [name]  The format for your next appointment:   In Person  Provider:   You may see [name], MD or one of the following Advanced Practice Providers on your designated Care Team:   Cecilie Kicks, NP   Other Instructions  Patients  taking blood thinners should generally stay away from medicines like ibuprofen, Advil, Motrin, naproxen, and Aleve due to risk of stomach bleeding and irritation of the stomach lining. You may take Tylenol as directed or talk to your primary doctor about alternatives.  Please review at home whether you are taking tamsulosin or not. If you are, please reach out to the prescriber of this medicine to let them know you are also already on doxazosin to clarify which to continue.
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
## 2020-08-14 ENCOUNTER — Other Ambulatory Visit: Payer: Self-pay | Admitting: Cardiology

## 2020-08-15 ENCOUNTER — Other Ambulatory Visit: Payer: Medicare Other

## 2020-08-18 ENCOUNTER — Telehealth: Payer: Self-pay | Admitting: Pulmonary Disease

## 2020-08-18 DIAGNOSIS — J398 Other specified diseases of upper respiratory tract: Secondary | ICD-10-CM

## 2020-08-18 DIAGNOSIS — J449 Chronic obstructive pulmonary disease, unspecified: Secondary | ICD-10-CM

## 2020-08-18 DIAGNOSIS — J9611 Chronic respiratory failure with hypoxia: Secondary | ICD-10-CM

## 2020-08-18 DIAGNOSIS — Z9981 Dependence on supplemental oxygen: Secondary | ICD-10-CM

## 2020-08-18 NOTE — Telephone Encounter (Signed)
Orders placed.   Call made to patient, made aware orders have been placed. Voiced understanding.   Nothing further needed at this time.

## 2020-08-18 NOTE — Telephone Encounter (Signed)
Call made to patient, confirmed DOB. Patient is requesting to switch DME companies. He is wanting to switch to [facility]. He states his cpap machine has not been working well stating it has reached the end of its life cycle. He was told he needed a new machine. He reports the prescription has been sent to [facility] and they keep saying they don't have it. He had the cpap machine order has been sent to [facility] for his cpap. He only needs his oxygen and nebulizer and supplies from [facility].   BI please advise if okay to send order for patient to switch to [facility] for oxygen and nebulizer. Thanks :)

## 2020-08-23 ENCOUNTER — Encounter: Payer: Self-pay | Admitting: Physician Assistant

## 2020-08-23 NOTE — Progress Notes (Signed)
As per 8/9 OV, reviewed med plan with Dr. [name] - he would like to continue long term DAPT as patient has been doing and is OK with current PPI combo. Jonuel Butterfield PA-C

## 2020-09-01 ENCOUNTER — Other Ambulatory Visit: Payer: Self-pay

## 2020-09-01 ENCOUNTER — Ambulatory Visit: Payer: Medicare Other | Admitting: Podiatry

## 2020-09-01 DIAGNOSIS — M79676 Pain in unspecified toe(s): Secondary | ICD-10-CM

## 2020-09-01 DIAGNOSIS — B351 Tinea unguium: Secondary | ICD-10-CM

## 2020-09-01 DIAGNOSIS — D689 Coagulation defect, unspecified: Secondary | ICD-10-CM | POA: Diagnosis not present

## 2020-09-01 DIAGNOSIS — E1151 Type 2 diabetes mellitus with diabetic peripheral angiopathy without gangrene: Secondary | ICD-10-CM

## 2020-09-03 NOTE — Progress Notes (Signed)
He presents today chief complaint of painful elongated nails.  States that his last hemoglobin A1c was at 6.8.  Objective: Vital signs are stable alert oriented x3.  There is no erythema edema cellulitis drainage or odor toenails are thick yellow dystrophic-like mycotic painful palpation as well as debridement.  Assessment: Pain in limb secondary onychomycosis.  Plan: Debridement of toenails 1 through 5 bilateral.

## 2020-10-06 ENCOUNTER — Other Ambulatory Visit: Payer: Self-pay | Admitting: Cardiology

## 2020-10-06 LAB — IFOBT (OCCULT BLOOD): IFOBT: NEGATIVE

## 2020-10-24 ENCOUNTER — Telehealth: Payer: Self-pay | Admitting: Pulmonary Disease

## 2020-10-25 MED ORDER — BREZTRI AEROSPHERE 160-9-4.8 MCG/ACT IN AERO
2.0000 | INHALATION_SPRAY | Freq: Two times a day (BID) | RESPIRATORY_TRACT | 11 refills | Status: DC
  Filled 2021-10-16: qty 10.7, 30d supply, fill #0

## 2020-10-25 NOTE — Telephone Encounter (Incomplete Revision)
Spoke with the pt Rx for [facility] sent to his preferred pharm  Nothing further needed

## 2020-10-25 NOTE — Telephone Encounter (Addendum)
Spoke with the pt Rx for [facility] sent to his preferred pharm  Nothing further needed

## 2020-10-25 NOTE — Telephone Encounter (Signed)
Spoke with the pt  He is needing new neb machine and supplies from [facility]  We ordered this 08/19/20 and referral states [facility] received this but they told pt they never did  Do we need to put in a new order? He needs machine bc his is leaking

## 2020-10-27 NOTE — Telephone Encounter (Signed)
Manfred Arch, Sandrea Hammond; Thresa Ross has been sent. He should be receiving it within a few days   Pt aware of Neb machine is on the way

## 2020-10-27 NOTE — Telephone Encounter (Signed)
Message sent to [facility] 

## 2020-11-11 ENCOUNTER — Other Ambulatory Visit: Payer: Self-pay | Admitting: *Deleted

## 2020-11-11 DIAGNOSIS — I779 Disorder of arteries and arterioles, unspecified: Secondary | ICD-10-CM

## 2020-11-24 ENCOUNTER — Other Ambulatory Visit: Payer: Self-pay | Admitting: Cardiology

## 2020-12-01 ENCOUNTER — Other Ambulatory Visit: Payer: Self-pay

## 2020-12-01 ENCOUNTER — Encounter: Payer: Self-pay | Admitting: Podiatry

## 2020-12-01 ENCOUNTER — Ambulatory Visit (INDEPENDENT_AMBULATORY_CARE_PROVIDER_SITE_OTHER): Payer: Medicare Other | Admitting: Podiatry

## 2020-12-01 DIAGNOSIS — B351 Tinea unguium: Secondary | ICD-10-CM | POA: Diagnosis not present

## 2020-12-01 DIAGNOSIS — D689 Coagulation defect, unspecified: Secondary | ICD-10-CM | POA: Diagnosis not present

## 2020-12-01 DIAGNOSIS — M79676 Pain in unspecified toe(s): Secondary | ICD-10-CM | POA: Diagnosis not present

## 2020-12-01 DIAGNOSIS — E1151 Type 2 diabetes mellitus with diabetic peripheral angiopathy without gangrene: Secondary | ICD-10-CM

## 2020-12-01 NOTE — Progress Notes (Signed)
Presents today chief complaint of painful elongated toenails bilaterally.  Type 2 diabetes mellitus with peripheral vascular disease.  He also has peripheral neuropathy to some degree.  Objective: Vital signs stable alert oriented x3 pulses are mildly palpable capillary fill time is immediate neurologic sensorium is slightly diminished per Semmes Weinstein monofilament.  Toenails are thick yellow dystrophic clinically mycotic painful palpation as well as debridement.  Orthopedic evaluation does demonstrate contracture digital deformities.  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  Plan: Debridement of toenails 1 through 5 bilateral.

## 2020-12-02 ENCOUNTER — Ambulatory Visit (HOSPITAL_COMMUNITY)
Admission: RE | Admit: 2020-12-02 | Discharge: 2020-12-02 | Disposition: A | Discharge status: Home / Self Care | Payer: Medicare Other | Source: Ambulatory Visit | Attending: Physician Assistant | Admitting: Physician Assistant

## 2020-12-02 ENCOUNTER — Ambulatory Visit: Payer: Medicare Other | Admitting: Physician Assistant

## 2020-12-02 VITALS — BP 131/68 | HR 71 | Temp 98.2°F | Resp 20 | Ht 66.0 in | Wt 233.0 lb

## 2020-12-02 DIAGNOSIS — I779 Disorder of arteries and arterioles, unspecified: Secondary | ICD-10-CM
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
## 2020-12-08 ENCOUNTER — Other Ambulatory Visit: Payer: Self-pay | Admitting: Cardiology

## 2020-12-12 ENCOUNTER — Ambulatory Visit: Payer: Medicare Other | Admitting: Cardiology

## 2020-12-12 ENCOUNTER — Other Ambulatory Visit: Payer: Self-pay

## 2020-12-12 ENCOUNTER — Encounter: Payer: Self-pay | Admitting: Cardiology

## 2020-12-12 VITALS — BP 140/60 | HR 80 | Ht 66.0 in | Wt 232.0 lb

## 2020-12-12 DIAGNOSIS — E7800 Pure hypercholesterolemia, unspecified: Secondary | ICD-10-CM

## 2020-12-12 DIAGNOSIS — I6529 Occlusion and stenosis of unspecified carotid artery: Secondary | ICD-10-CM

## 2020-12-12 DIAGNOSIS — I25709 Atherosclerosis of coronary artery bypass graft(s), unspecified, with unspecified angina pectoris: Secondary | ICD-10-CM | POA: Diagnosis not present

## 2020-12-12 NOTE — Assessment & Plan Note (Addendum)
Continue with current plan.  Nitroglycerin as needed.  He did not tolerate isosorbide in the past.  Did not tolerate Ranexa.  Also some of his symptoms could be GI related and that they happen after large meal at night.  He is on dual antiplatelet therapy which we will continue, PPI.  In 2019, all of his grafts were open.  Small vessel disease noted.  Not amenable to PCI.  Nuclear stress test also reassuring.  If symptoms worsen or become more worrisome we can always reconsider angiogram.

## 2020-12-12 NOTE — Assessment & Plan Note (Signed)
Multifactorial shortness of breath including pulmonary deconditioning cardiac etc.  Continue with Lasix/potassium
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
## 2020-12-12 NOTE — Patient Instructions (Signed)
Medication Instructions:  The current medical regimen is effective;  continue present plan and medications.  *If you need a refill on your cardiac medications before your next appointment, please call your pharmacy*  Testing/Procedures: Your physician has requested that you have a carotid duplex. This test is an ultrasound of the carotid arteries in your neck. It looks at blood flow through these arteries that supply the brain with blood. Allow one hour for this exam. There are no restrictions or special instructions. This will be completed at our [facility] office.  Follow-Up: At [facility], you and your health needs are our priority.  As part of our continuing mission to provide you with exceptional heart care, we have created designated Provider Care Teams.  These Care Teams include your primary Cardiologist (physician) and Advanced Practice Providers (APPs -  Physician Assistants and Nurse Practitioners) who all work together to provide you with the care you need, when you need it.  We recommend signing up for the patient portal called "MyChart".  Sign up information is provided on this After Visit Summary.  MyChart is used to connect with patients for Virtual Visits (Telemedicine).  Patients are able to view lab/test results, encounter notes, upcoming appointments, etc.  Non-urgent messages can be sent to your provider as well.   To learn more about what you can do with MyChart, go to [email or web address].    Your next appointment:   4 month(s)  The format for your next appointment:   In Person  Provider:   Melina Copa, PA-C     Then, [name], MD will plan to see you again in 1 year(s).    Thank you for choosing [facility]!!

## 2020-12-12 NOTE — Assessment & Plan Note (Signed)
We will check carotid Doppler since it has been a while.  Has known peripheral vascular disease.  Appreciate vascular surgery monitoring.

## 2020-12-12 NOTE — Assessment & Plan Note (Signed)
Currently on pravastatin which was recently increased.  LDL goal less than 70.  Continue to monitor per Dr. Sharlett Iles

## 2020-12-20 ENCOUNTER — Telehealth: Payer: Self-pay | Admitting: Pulmonary Disease

## 2020-12-20 MED ORDER — ALBUTEROL SULFATE (2.5 MG/3ML) 0.083% IN NEBU: INHALATION_SOLUTION | RESPIRATORY_TRACT | 11 refills | Status: DC

## 2020-12-20 NOTE — Telephone Encounter (Signed)
I have called the pt and he is aware of rx that has been sent to [facility] per his request.

## 2020-12-28 ENCOUNTER — Ambulatory Visit (HOSPITAL_COMMUNITY)
Admission: RE | Admit: 2020-12-28 | Discharge: 2020-12-28 | Disposition: A | Discharge status: Home / Self Care | Payer: Medicare Other | Source: Ambulatory Visit | Attending: Internal Medicine | Admitting: Internal Medicine

## 2020-12-28 ENCOUNTER — Other Ambulatory Visit: Payer: Self-pay

## 2020-12-28 DIAGNOSIS — I6523 Occlusion and stenosis of bilateral carotid arteries: Secondary | ICD-10-CM | POA: Diagnosis not present

## 2020-12-28 DIAGNOSIS — I6529 Occlusion and stenosis of unspecified carotid artery: Secondary | ICD-10-CM | POA: Diagnosis present

## 2021-01-04 ENCOUNTER — Other Ambulatory Visit: Payer: Self-pay | Admitting: Cardiology

## 2021-01-04 DIAGNOSIS — I6529 Occlusion and stenosis of unspecified carotid artery: Secondary | ICD-10-CM

## 2021-03-02 ENCOUNTER — Ambulatory Visit: Payer: Medicare Other | Admitting: Podiatry

## 2021-03-09 ENCOUNTER — Encounter: Payer: Self-pay | Admitting: Podiatry

## 2021-03-09 ENCOUNTER — Other Ambulatory Visit: Payer: Self-pay

## 2021-03-09 ENCOUNTER — Ambulatory Visit: Payer: Medicare Other | Admitting: Podiatry

## 2021-03-09 DIAGNOSIS — M79676 Pain in unspecified toe(s): Secondary | ICD-10-CM | POA: Diagnosis not present

## 2021-03-09 DIAGNOSIS — D689 Coagulation defect, unspecified: Secondary | ICD-10-CM

## 2021-03-09 DIAGNOSIS — Z9289 Personal history of other medical treatment: Secondary | ICD-10-CM | POA: Insufficient documentation

## 2021-03-09 DIAGNOSIS — B351 Tinea unguium: Secondary | ICD-10-CM | POA: Diagnosis not present

## 2021-03-09 DIAGNOSIS — T1490XA Injury, unspecified, initial encounter: Secondary | ICD-10-CM | POA: Insufficient documentation

## 2021-03-09 DIAGNOSIS — E1151 Type 2 diabetes mellitus with diabetic peripheral angiopathy without gangrene: Secondary | ICD-10-CM

## 2021-03-09 DIAGNOSIS — E662 Morbid (severe) obesity with alveolar hypoventilation: Secondary | ICD-10-CM | POA: Insufficient documentation

## 2021-03-09 NOTE — Progress Notes (Signed)
He presents today chief complaint of painful elongated toenails. ? ?Objective: Pulses are barely palpable no open lesions or wounds.  Nails are long thick yellow dystrophic onychomycotic rigid hammertoe deformity second left second due to foot drop concerned about the toe dragging.  And a small distal clavus was noted which I trimmed today. ? ?Assessment: Pain in limb secondary to peripheral circulatory disease peripheral neuropathy and pain in limb secondary to onychomycosis. ? ?Plan: Placed him in multiple pads to help with the distal aspect of the toe and debrided nails 1 through 5 bilateral. ?

## 2021-04-22 ENCOUNTER — Encounter: Payer: Self-pay | Admitting: Physician Assistant

## 2021-04-22 NOTE — Progress Notes (Signed)
? ?Cardiology Office Note   ? ?Date:  04/24/2021  ? ?ID:  NEILAN RIZZO, DOB 1950-11-19, MRN [id number] ? ?PCP:  [name], MD  ?Cardiologist:  [name], MD  ?Electrophysiologist:  None  ? ?Chief Complaint: f/u CAD ? ?History of Present Illness:  ? ?Hunter [name] is a 71 y.o. male with history of CAD (inferior STEMI 2005 tx with PCI to RCA s/p subsequent CABG), chronic diastolic CHF, DM, PAD by ABIs (followed by [facility]), carotid artery disease 40-59% BICA 12/2020), COPD, GERD, HTN, HLD, OSA on CPAP who presents for follow-up. His last cath was in 2019 with findings below, no change from prior, recommended for medical therapy. He is in a wheelchair due to history of spinal cord tumor. He was seen in 05/2020 for an episode of chest pain. NST 05/18/20 showed prior MI but no ischemia, EF 46% -> 2D echo was done to look at EF and was 50-55%, no RWMA, moderate LVH, grade 2 DD. He previously had HA/nausea with Imdur and dizziness with Ranexa. He was seen back in follow-up 06/2020 and still having intermittent symptoms but expressed a desire to avoid invasive procedures. There was a component of GERD suspecting to be contributing. He has not wished to add or change current medication regimen (pt did not want to change PPI). We had also reviewed limiting NSAIDs. Per prior review with Dr. [name] he has been maintained on long term DAPT. ? ?He is seen back for follow-up today feeling well. He has not had any recent chest pain ever since he started chasing his citrus metamucil with a glass of water to wash it down. He reports chronic DOE but states even this is better than prior. He denies any palpitations. He is tolerating pravastatin without difficulty but we discussed that his last LDL was still not at goal. He's accompanied by his wife today and together we successfully managed to get his phone out of the "Safe Mode" it was stuck in. ? ?  ? ?Labwork independently reviewed: ?09/2020 Hgb 13.2, pt 233, LDL 81,  trig 106, K 4.1, Cr 1.10, LFTs wnl ?05/2020 Hgb 12.4, BNP wnl, LDL 83, CMET Ok except glu 157 ? ? ?Cardiology Studies:  ? ?Studies reviewed are outlined and summarized above. Reports included below if pertinent.  ? ?2D Echo 05/2020 ? 1. Left ventricular ejection fraction, by estimation, is 50 to 55%. The  ?left ventricle has low normal function. The left ventricle has no regional  ?wall motion abnormalities. There is moderate left ventricular hypertrophy.  ?Left ventricular diastolic  ?parameters are consistent with Grade II diastolic dysfunction  ?(pseudonormalization). Elevated left atrial pressure.  ? 2. Right ventricular systolic function is normal. The right ventricular  ?size is normal.  ? 3. The mitral valve is normal in structure. Trivial mitral valve  ?regurgitation. No evidence of mitral stenosis.  ? 4. The aortic valve is tricuspid. Aortic valve regurgitation is not  ?visualized. No aortic stenosis is present.  ? 5. The inferior vena cava is normal in size with greater than 50%  ?respiratory variability, suggesting right atrial pressure of 3 mmHg. ?  ?NST 05/2020 ?Nuclear stress EF: 46%. ?The left ventricular ejection fraction is mildly decreased (45-54%). ?There was no ST segment deviation noted during stress. ?Findings consistent with prior myocardial infarction. ?This is an intermediate risk study. ?  ?Moderate sized inferior lateral wall infarct from apex to base no ischemia ?EF estimated at 46% ?  ?Cath 09/2017 ?Prox RCA to Mid RCA  lesion is 70% stenosed. ?Acute Mrg lesion is 75% stenosed. ?Dist RCA lesion is 100% stenosed. ?SVG graft was visualized by angiography and is normal in caliber. ?Ost LAD to Prox LAD lesion is 80% stenosed. ?Prox LAD to Mid LAD lesion is 100% stenosed. ?Ost 2nd Mrg to 2nd Mrg lesion is 80% stenosed. ?Ost 3rd Mrg lesion is 80% stenosed. ?Ost 1st Mrg to 1st Mrg lesion is 100% stenosed. ?SVG graft was visualized by angiography and is normal in caliber. ?SVG graft was visualized by  angiography and is normal in caliber. ?LIMA graft was visualized by angiography and is normal in caliber. ?LV end diastolic pressure is normal. ?Hemodynamic findings consistent with mild pulmonary hypertension. ?  ?1. Severe 3 vessel obstructive CAD ?2. Patent LIMA to the LAD ?3. Patent SVG to the first diagonal ?4. Patent SVG to the OM1 ?5. Patent SVG to the distal RCA ?6. Normal LV filling pressures ?7. Mild pulmonary HTN ?8. Normal cardiac output ?  ?Plan: angiographically there is no change compared to 2012. There is potential ischemia in the OM2 and OM3 distribution but these vessels are small and diffusely diseased and not suitable for PCI. This is also unchanged from prior angios. Consider pulmonary evaluation for his dyspnea. ?  ?Recommend Aspirin '81mg'$  daily for moderate CAD.  ? ? ?Past Medical History:  ?Diagnosis Date  ? (HFpEF) heart failure with preserved ejection fraction ([place])   ? Echo 5/22: EF 50-55, no RWMA, moderate LVH, GRII DD, normal RVSF, trivial MR  ? Bilateral lower extremity edema   ? CAD (coronary artery disease) cardiologist-  dr [name]  ? a. Remote MI with stent in 2005;  b. CABG x 5 in 2005;  c. 09/2010 Cath: LM nl, LAD 70p, 90-23m D1 80p, LCX 339mOM1 100, OM2 90ost, OM3 80, RCA 8054mG->PDA->RPL nl, VG->Diag nl, VG->OM1 nl, LIMA->LAD nl, EF 60%-->Med Rx.// Myoview 5/22: EF 46, mod inf infarct, no ischemia   ? Carotid artery disease ([facility] ? Chronic venous insufficiency   ? post vein harvest for CABG  ? COPD (chronic obstructive pulmonary disease) ([facility] ? Diabetes mellitus type 2, controlled ([facility] ? Difficult intravenous access 06/16/2015  ? multiple attempt IV starts until use of scanner  ? ED (erectile dysfunction) of organic origin   ? Foot drop, bilateral   ? GERD (gastroesophageal reflux disease)   ? H/O spinal cord injury   ? a. more or less wheelchair bound. post mulitple back surgery's including resection spinal tumor  ? History of acute myocardial infarction of inferior  wall   ? 02/ 2005 inferoposterior  w/ stenting to RCA  ? History of benign spinal cord tumor   ? 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 History of cellulitis   ? left lower leg-- now resolved  ? History of ulcer of lower limb   ? venous statis  ---  resolved  ? Hyperlipidemia   ? Hypertension   ? Increased liver enzymes   ? due to alcohol use  ? Left rotator cuff tear   ? [facility]  ? with stent placement  ? Nocturia   ? OA (osteoarthritis)   ? left shoulder  ? OAB (overactive bladder)   ? OSA on CPAP   ? PAD (peripheral artery disease) ([facility] ? followed by Dr. FieOneida Alar S/P CABG x 5   ? 04-28-2003  ? Type 2 diabetes mellitus ([facility] ?  Urinary incontinence, urge   ? Wheelchair bound   ? ? ?Past Surgical History:  ?Procedure Laterality Date  ? ANTERIOR CERVICAL DECOMP/DISCECTOMY FUSION  08-15-1999    ? C5 -- C6  ? BLEPHAROPLASTY Bilateral   ? CARDIAC CATHETERIZATION  04-22-2003  dr [name]  ? abnormal myoview/  severe 3-vessel obstructive CAD, continued patency of dRCA stent , nomral LVF  ? CARDIAC CATHETERIZATION  12-29-2003  dr [name]  ? obstructive native vessel disease, patent grafts, moderate right carotid and left vertebral artery disease,  mild LV dysfunction w/ ef 45%  ? CARDIAC CATHETERIZATION  03-20-2005  dr [name]  ? continued patency of all grafts, potential ischemia and/or cause chest pain include dCFX distribution w/ bifurcation lesion of 2nd and 3rd marginal vessels and dRCA w/ small subsidiary branch to PDA (these vessels very small caliber and would not be suited to catheter-based intervention)  ? CARDIAC CATHETERIZATION  09-11-2010  dr [name]  ? compared to prior cath , no significant change/  signigicant disease at the bifurcation of the 2nd and 3rd marginal branches but difficult to get good percutaneous intervention/  normal LVF, ef 60%  ? CARDIOVASCULAR STRESS TEST  last one 04-27-2014  dr [name]  ? normal nuclear study/  normal LV function  and wall motion, ef 50%  ? CATARACT EXTRACTION W/ INTRAOCULAR LENS  IMPLANT, BILATERAL  2014  ? CHOLECYSTECTOMY    ? COLONOSCOPY WITH PROPOFOL N/A 11/21/2015  ? Procedure: COLONOSCOPY WITH PROPOFOL;  Surgeon

## 2021-04-24 ENCOUNTER — Telehealth: Payer: Self-pay | Admitting: Physician Assistant

## 2021-04-24 ENCOUNTER — Ambulatory Visit: Payer: Medicare Other | Admitting: Physician Assistant

## 2021-04-24 ENCOUNTER — Encounter: Payer: Self-pay | Admitting: Physician Assistant

## 2021-04-24 VITALS — BP 129/59 | HR 70 | Ht 66.0 in | Wt 234.0 lb

## 2021-04-24 DIAGNOSIS — E785 Hyperlipidemia, unspecified: Secondary | ICD-10-CM | POA: Diagnosis not present

## 2021-04-24 DIAGNOSIS — I2510 Atherosclerotic heart disease of native coronary artery without angina pectoris: Secondary | ICD-10-CM

## 2021-04-24 DIAGNOSIS — I5032 Chronic diastolic (congestive) heart failure: Secondary | ICD-10-CM | POA: Diagnosis not present

## 2021-04-24 DIAGNOSIS — I10 Essential (primary) hypertension: Secondary | ICD-10-CM | POA: Diagnosis not present

## 2021-04-24 DIAGNOSIS — I6523 Occlusion and stenosis of bilateral carotid arteries: Secondary | ICD-10-CM

## 2021-04-24 MED ORDER — ROSUVASTATIN CALCIUM 20 MG PO TABS: 20.0000 mg | ORAL_TABLET | Freq: Every day | ORAL | 1 refills | Status: DC

## 2021-04-24 NOTE — Patient Instructions (Signed)
Medication Instructions:  ?DISCONTINUE Pravastatin  ?START Crestor '20mg'$  Take 1 tablet once a day ?*If you need a refill on your cardiac medications before your next appointment, please call your pharmacy* ? ? ?Lab Work: ?Your physician recommends that you return for lab work in: [place] ?If you have labs (blood work) drawn today and your tests are completely normal, you will receive your results only by: ?MyChart Message (if you have MyChart) OR ?A paper copy in the mail ?If you have any lab test that is abnormal or we need to change your treatment, we will call you to review the results. ? ? ?Testing/Procedures: ?SCHEDULE CAROTID US FOR 01/2022 ? ? ?Follow-Up: ?At [facility], you and your health needs are our priority.  As part of our continuing mission to provide you with exceptional heart care, we have created designated Provider Care Teams.  These Care Teams include your primary Cardiologist (physician) and Advanced Practice Providers (APPs -  Physician Assistants and Nurse Practitioners) who all work together to provide you with the care you need, when you need it. ? ?We recommend signing up for the patient portal called "MyChart".  Sign up information is provided on this After Visit Summary.  MyChart is used to connect with patients for Virtual Visits (Telemedicine).  Patients are able to view lab/test results, encounter notes, upcoming appointments, etc.  Non-urgent messages can be sent to your provider as well.   ?To learn more about what you can do with MyChart, go to [email or web address].   ? ?Your next appointment:   ?6 month(s) ? ?The format for your next appointment:   ?In Person ? ?Provider:   ?[name], MD   ? ? ?Other Instructions ? ?Important Information About Sugar ? ? ? ? ?  ?

## 2021-04-24 NOTE — Telephone Encounter (Signed)
? ?  Visit Follow-up Question ? ?In prior visits we have not been sure whether he is taking both tamsulosin and doxazosin as we do not prescribe them. Please check with the patient/wife once they're home whether they are taking both for our records - if so, since they are medicines in the same class, would recommend they reach out to the prescriber to clarify which they should be taking. Thanks! ? ?Melina Copa PA-C ? ?

## 2021-05-02 NOTE — Telephone Encounter (Addendum)
Contacted the patient and he states he is not aware of what he is taking because his wife prepares his medication. He states his wife gets off at 3 pm and he can have her contact the office once she gets home. ? ?Contacted [facility], patients pharmacy and was informed that the Cardura was last filled on 03/07/21 with a 90 day supply. Pharmacist stated the Tamsulosin has never been filled there and there is no rx on file.  ? ?Awaiting a call back from the patients wife. ?

## 2021-05-02 NOTE — Telephone Encounter (Signed)
Patient returned call

## 2021-05-03 NOTE — Telephone Encounter (Signed)
Spoke with patients wife who states she left the list at home on the table for the patient. She states she just found out her grandson is in ICU and she can not think straight.  ? ?I contacted the patient who states the medication list states he is taking Doxazosin. ? ?Medication list updated. ?

## 2021-05-08 ENCOUNTER — Other Ambulatory Visit: Payer: Self-pay | Admitting: Cardiology

## 2021-06-15 ENCOUNTER — Ambulatory Visit: Payer: Medicare Other | Admitting: Podiatry

## 2021-06-19 ENCOUNTER — Other Ambulatory Visit: Payer: Medicare Other

## 2021-06-19 DIAGNOSIS — E785 Hyperlipidemia, unspecified: Secondary | ICD-10-CM

## 2021-06-19 DIAGNOSIS — I10 Essential (primary) hypertension: Secondary | ICD-10-CM

## 2021-06-19 DIAGNOSIS — I5032 Chronic diastolic (congestive) heart failure: Secondary | ICD-10-CM

## 2021-06-19 DIAGNOSIS — I2510 Atherosclerotic heart disease of native coronary artery without angina pectoris: Secondary | ICD-10-CM

## 2021-06-19 DIAGNOSIS — I6523 Occlusion and stenosis of bilateral carotid arteries: Secondary | ICD-10-CM

## 2021-06-19 LAB — COMPREHENSIVE METABOLIC PANEL
ALT: 16 IU/L (ref 0–44)
AST: 18 IU/L (ref 0–40)
Albumin/Globulin Ratio: 1.8 (ref 1.2–2.2)
Albumin: 4 g/dL (ref 3.8–4.8)
Alkaline Phosphatase: 46 IU/L (ref 44–121)
BUN/Creatinine Ratio: 16 (ref 10–24)
BUN: 18 mg/dL (ref 8–27)
Bilirubin Total: 0.2 mg/dL (ref 0.0–1.2)
CO2: 24 mmol/L (ref 20–29)
Calcium: 9.7 mg/dL (ref 8.6–10.2)
Chloride: 99 mmol/L (ref 96–106)
Creatinine, Ser: 1.15 mg/dL (ref 0.76–1.27)
Globulin, Total: 2.2 g/dL (ref 1.5–4.5)
Glucose: 152 mg/dL — ABNORMAL HIGH (ref 70–99)
Potassium: 3.8 mmol/L (ref 3.5–5.2)
Sodium: 139 mmol/L (ref 134–144)
Total Protein: 6.2 g/dL (ref 6.0–8.5)
eGFR: 68 mL/min/{1.73_m2} (ref >59)

## 2021-06-19 LAB — LIPID PANEL
Chol/HDL Ratio: 3 ratio (ref 0.0–5.0)
Cholesterol, Total: 113 mg/dL (ref 100–199)
HDL: 38 mg/dL — ABNORMAL LOW (ref >39)
LDL Chol Calc (NIH): 52 mg/dL (ref 0–99)
Triglycerides: 127 mg/dL (ref 0–149)
VLDL Cholesterol Cal: 23 mg/dL (ref 5–40)

## 2021-06-23 ENCOUNTER — Ambulatory Visit: Payer: Medicare Other | Admitting: Podiatry

## 2021-07-20 ENCOUNTER — Encounter: Payer: Self-pay | Admitting: Podiatry

## 2021-07-20 ENCOUNTER — Ambulatory Visit (INDEPENDENT_AMBULATORY_CARE_PROVIDER_SITE_OTHER): Payer: Medicare Other | Admitting: Podiatry

## 2021-07-20 DIAGNOSIS — M79675 Pain in left toe(s): Secondary | ICD-10-CM

## 2021-07-20 DIAGNOSIS — B351 Tinea unguium: Secondary | ICD-10-CM | POA: Diagnosis not present

## 2021-07-20 DIAGNOSIS — D689 Coagulation defect, unspecified: Secondary | ICD-10-CM

## 2021-07-20 DIAGNOSIS — E1151 Type 2 diabetes mellitus with diabetic peripheral angiopathy without gangrene: Secondary | ICD-10-CM | POA: Diagnosis not present

## 2021-07-20 DIAGNOSIS — M79674 Pain in right toe(s): Secondary | ICD-10-CM

## 2021-07-20 NOTE — Progress Notes (Signed)
He presents today chief complaint of painful mycotic nails.  Objective: Vital signs are stable he is alert oriented x3 pulses are barely palpable bilateral.  No open lesions or wounds are noted.  He has no change in his neurovascular status.  His toenails are long thick yellow dystrophic onychomycotic.  Assessment: Pain limb secondary to diabetes diabetic peripheral neuropathy and painful mobic onychomycosis.  Pain: Debridement of toenails 1 through 5 bilateral.

## 2021-08-31 ENCOUNTER — Other Ambulatory Visit: Payer: Self-pay | Admitting: Cardiology

## 2021-09-12 ENCOUNTER — Telehealth: Payer: Self-pay | Admitting: Pulmonary Disease

## 2021-09-13 MED ORDER — ALBUTEROL SULFATE (2.5 MG/3ML) 0.083% IN NEBU: INHALATION_SOLUTION | RESPIRATORY_TRACT | 11 refills | Status: DC

## 2021-09-13 NOTE — Telephone Encounter (Signed)
Refills sent into [facility]. Patient verified it was albuterol nebs he needed refilled. Nothing further needed

## 2021-09-19 ENCOUNTER — Other Ambulatory Visit (HOSPITAL_COMMUNITY): Payer: Self-pay

## 2021-09-19 MED ORDER — OZEMPIC (1 MG/DOSE) 4 MG/3ML ~~LOC~~ SOPN
1.0000 mg | PEN_INJECTOR | SUBCUTANEOUS | 11 refills | Status: DC
  Filled 2021-09-19: qty 3, 28d supply, fill #0
  Filled 2021-10-12: qty 3, 28d supply, fill #1
  Filled 2021-11-07: qty 3, 28d supply, fill #2
  Filled 2021-12-12: qty 3, 28d supply, fill #3
  Filled 2022-01-09: qty 3, 28d supply, fill #4
  Filled 2022-02-04: qty 3, 28d supply, fill #5
  Filled 2022-03-08: qty 3, 28d supply, fill #6
  Filled 2022-04-15: qty 3, 28d supply, fill #7
  Filled 2022-05-07: qty 3, 28d supply, fill #8
  Filled 2022-06-10: qty 3, 28d supply, fill #9
  Filled 2022-07-08: qty 3, 28d supply, fill #10
  Filled 2022-08-18: qty 3, 28d supply, fill #11

## 2021-09-20 ENCOUNTER — Other Ambulatory Visit (HOSPITAL_COMMUNITY): Payer: Self-pay

## 2021-09-30 ENCOUNTER — Other Ambulatory Visit: Payer: Self-pay | Admitting: Cardiology

## 2021-10-04 ENCOUNTER — Other Ambulatory Visit: Payer: Self-pay | Admitting: Physician Assistant

## 2021-10-05 ENCOUNTER — Ambulatory Visit (INDEPENDENT_AMBULATORY_CARE_PROVIDER_SITE_OTHER): Payer: Medicare Other | Admitting: Pulmonary Disease

## 2021-10-05 ENCOUNTER — Encounter: Payer: Self-pay | Admitting: Pulmonary Disease

## 2021-10-05 VITALS — BP 130/70 | HR 64 | Ht 66.0 in | Wt 232.0 lb

## 2021-10-05 DIAGNOSIS — J9611 Chronic respiratory failure with hypoxia: Secondary | ICD-10-CM

## 2021-10-05 DIAGNOSIS — Z9981 Dependence on supplemental oxygen: Secondary | ICD-10-CM

## 2021-10-05 DIAGNOSIS — J398 Other specified diseases of upper respiratory tract: Secondary | ICD-10-CM | POA: Diagnosis not present

## 2021-10-05 DIAGNOSIS — J449 Chronic obstructive pulmonary disease, unspecified: Secondary | ICD-10-CM
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
## 2021-10-05 NOTE — Patient Instructions (Signed)
Thank you for visiting Dr. Valeta Harms at [facility]. Today we recommend the following:  Needs refills of inhalers  Needs albuterol refills for solution sent to specialty pharmacy   Return in about 1 year (around 10/06/2022), or if symptoms worsen or fail to improve.    Please do your part to reduce the spread of COVID-19.

## 2021-10-12 ENCOUNTER — Other Ambulatory Visit (HOSPITAL_COMMUNITY): Payer: Self-pay

## 2021-10-12 ENCOUNTER — Ambulatory Visit: Payer: Medicare Other | Admitting: Podiatry

## 2021-10-12 DIAGNOSIS — E1151 Type 2 diabetes mellitus with diabetic peripheral angiopathy without gangrene: Secondary | ICD-10-CM | POA: Diagnosis not present

## 2021-10-12 DIAGNOSIS — L601 Onycholysis: Secondary | ICD-10-CM

## 2021-10-12 DIAGNOSIS — D689 Coagulation defect, unspecified: Secondary | ICD-10-CM | POA: Diagnosis not present

## 2021-10-12 NOTE — Progress Notes (Signed)
Subjective:  Patient ID: Hunter [name], male    DOB: [birth year],  MRN: [id number]  Chief Complaint  Patient presents with   Nail Problem    Room 17 Pt states he snagged his left 4th toenail on his compression stockings this morning. PT states he could not stop bleeding. Pt is a diabetic and also on blood thinners.     71 y.o. male presents for concern of injury to his left fourth toenail.  He snagged his left fourth toenail on a compression stocking and it lifted off the nail bed.  He had significant bleeding because he is on blood thinners.  The bleeding has since stopped but the area is still slightly tender with the nail being loose.  He does have a history of diabetes with peripheral arterial disease and neuropathy.  Past Medical History:  Diagnosis Date   (HFpEF) heart failure with preserved ejection fraction ([place])    Echo 5/22: EF 50-55, no RWMA, moderate LVH, GRII DD, normal RVSF, trivial MR   Bilateral lower extremity edema    CAD (coronary artery disease) cardiologist-  dr [name]   a. Remote MI with stent in 2005;  b. CABG x 5 in 2005;  c. 09/2010 Cath: LM nl, LAD 70p, 90-56m D1 80p, LCX 324mOM1 100, OM2 90ost, OM3 80, RCA 8072mG->PDA->RPL nl, VG->Diag nl, VG->OM1 nl, LIMA->LAD nl, EF 60%-->Med Rx.// Myoview 5/22: EF 46, mod inf infarct, no ischemia    Carotid artery disease (HCC)    Chronic venous insufficiency    post vein harvest for CABG   COPD (chronic obstructive pulmonary disease) ([place]  Diabetes mellitus type 2, controlled ([facility]  Difficult intravenous access 06/16/2015   multiple attempt IV starts until use of scanner   ED (erectile dysfunction) of organic origin    Foot drop, bilateral    GERD (gastroesophageal reflux disease)    H/O spinal cord injury    a. more or less wheelchair bound. 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 FieOneida AlarS/P CABG x 5    04-28-2003   Type 2 diabetes mellitus (HCC)    Urinary incontinence, urge    Wheelchair bound     Allergies  Allergen Reactions   Codeine Nausea And Vomiting and Other (See Comments)    Severe stomach cramps   Amitriptyline Hcl     Other reaction(s): Unknown   Atorvastatin     Other reaction(s): Increase LFT's   Chantix [Varenicline]     Other reaction(s): SOB   Glucophage [Metformin]     Other reaction(s): N/V/Diarrhea   Ranolazine     "  nauseated and dizziness"   Lisinopril Cough   Ramipril Cough   Tape Other (See Comments) and Tinitus    Only plastic tape--can use paper tape OK. Blisters    ROS: Negative except as per HPI above  Objective:  General: AAO x3, NAD  Dermatological: With inspection there is noted to be partial and nearly complete avulsion of the fourth toenail off the nailbed on the left fourth toe.  It is adherent only very slightly and loosely at the nail base.  There is some mild dried blood on the nailbed following removal of the nail.  No active bleeding at this time.  Overall appears clean without any erythema drainage or maceration.  Vascular:  Dorsalis Pedis artery and Posterior Tibial artery pedal pulses are 1/4 bilateral.  Capillary fill time < 3 sec to all digits.   Neruologic: Grossly diminished via light touch bilateral. Protective threshold intact to all sites bilateral.    Musculoskeletal: No gross boney pedal deformities bilateral. No pain, crepitus, or limitation noted with foot and ankle range of motion bilateral. Muscular strength 5/5 in all groups tested bilateral.  Gait: Unassisted, Nonantalgic.   No images are attached to the encounter.   Assessment:   1. Nail plate separation   2. Coagulation defect ([place])   3. Type II diabetes mellitus with peripheral circulatory disorder [facility])      Plan:  Patient was evaluated and treated and all questions answered.  Partial Nail plate lysis and subungual hematoma after nail trauma, left 4th toe -Patient elects to proceed remove partially avulsed toenail today.  - fourth toenail removed with total nail avulsion procedure -Educated on post-procedure care including soaking. Written instructions provided and reviewed. -No antibiotics indicated no signs of infection  Procedure: Left 4th toe total nail avulsion Location: Left 4th toe  total  nail  Anesthesia: None required secondary to neuropathy Skin Prep: Betadine. Dressing: Antibiotic ointment and Band-Aid Technique: The affected nail excised in total by lifting the nail off the nailbed it was completely loosened and removed without incident.  irrigated out with alcohol.dried blood was removed with alcohol.  Antibiotic ointment and Band-Aid applied Disposition: Patient tolerated procedure well. Patient will follow-up with Dr. Milinda Pointer for regular scheduled foot care.   Return if symptoms worsen or fail to improve.          Everitt Amber, DPM [facility]

## 2021-10-13 ENCOUNTER — Other Ambulatory Visit (HOSPITAL_COMMUNITY): Payer: Self-pay

## 2021-10-16 ENCOUNTER — Other Ambulatory Visit (HOSPITAL_COMMUNITY): Payer: Self-pay

## 2021-10-16 MED ORDER — INSULIN LISPRO (1 UNIT DIAL) 100 UNIT/ML (KWIKPEN)
23.0000 [IU] | PEN_INJECTOR | SUBCUTANEOUS | 5 refills | Status: DC
  Filled 2021-10-16: qty 15, 22d supply, fill #0

## 2021-10-16 MED ORDER — INSULIN PEN NEEDLE 32G X 4 MM MISC
5 refills | Status: DC
  Filled 2021-10-16: qty 400, 100d supply, fill #0
  Filled 2022-02-04: qty 400, 100d supply, fill #1

## 2021-10-16 MED ORDER — METHOCARBAMOL 500 MG PO TABS
500.0000 mg | ORAL_TABLET | Freq: Every evening | ORAL | 3 refills | Status: DC
  Filled 2021-10-16 – 2022-01-22 (×2): qty 90, 90d supply, fill #0
  Filled 2022-04-15: qty 90, 90d supply, fill #1

## 2021-10-16 MED ORDER — GABAPENTIN 300 MG PO CAPS
ORAL_CAPSULE | ORAL | 1 refills | Status: DC
  Filled 2021-10-16 – 2021-12-12 (×2): qty 270, 90d supply, fill #0

## 2021-10-16 MED ORDER — OMEPRAZOLE 40 MG PO CPDR
40.0000 mg | DELAYED_RELEASE_CAPSULE | Freq: Two times a day (BID) | ORAL | 3 refills | Status: DC
  Filled 2021-10-16 – 2021-12-12 (×2): qty 180, 90d supply, fill #0

## 2021-10-16 MED FILL — Doxazosin Mesylate Tab 2 MG: ORAL | 90 days supply | Qty: 90 | Fill #0 | Status: CN

## 2021-10-16 MED FILL — Rosuvastatin Calcium Tab 20 MG: ORAL | 90 days supply | Qty: 90 | Fill #0 | Status: CN

## 2021-10-16 MED FILL — Furosemide Tab 40 MG: ORAL | 90 days supply | Qty: 180 | Fill #0 | Status: CN

## 2021-10-16 MED FILL — Potassium Chloride Microencapsulated Crys ER Tab 20 mEq: ORAL | 90 days supply | Qty: 90 | Fill #0 | Status: CN

## 2021-10-16 MED FILL — Metoprolol Tartrate Tab 50 MG: ORAL | 90 days supply | Qty: 180 | Fill #0 | Status: AC

## 2021-10-17 ENCOUNTER — Other Ambulatory Visit (HOSPITAL_COMMUNITY): Payer: Self-pay

## 2021-10-17 MED ORDER — INSULIN LISPRO (1 UNIT DIAL) 100 UNIT/ML (KWIKPEN)
30.0000 [IU] | PEN_INJECTOR | Freq: Three times a day (TID) | SUBCUTANEOUS | 5 refills | Status: DC
  Filled 2021-10-17 – 2021-10-18 (×2): qty 60, 67d supply, fill #0
  Filled 2021-12-28 – 2022-01-02 (×4): qty 60, 67d supply, fill #1
  Filled 2022-02-04 – 2022-03-08 (×2): qty 60, 67d supply, fill #2
  Filled 2022-05-07: qty 60, 67d supply, fill #3
  Filled 2022-07-08: qty 60, 67d supply, fill #4
  Filled 2022-10-08: qty 60, 67d supply, fill #5

## 2021-10-18 ENCOUNTER — Other Ambulatory Visit (HOSPITAL_COMMUNITY): Payer: Self-pay

## 2021-10-26 ENCOUNTER — Other Ambulatory Visit (HOSPITAL_COMMUNITY): Payer: Self-pay

## 2021-10-26 ENCOUNTER — Ambulatory Visit: Payer: Medicare Other | Attending: Cardiology | Admitting: Cardiology

## 2021-10-26 ENCOUNTER — Encounter: Payer: Self-pay | Admitting: Cardiology

## 2021-10-26 VITALS — BP 130/50 | HR 69 | Ht 66.0 in | Wt 232.0 lb

## 2021-10-26 DIAGNOSIS — I6523 Occlusion and stenosis of bilateral carotid arteries: Secondary | ICD-10-CM

## 2021-10-26 DIAGNOSIS — I5032 Chronic diastolic (congestive) heart failure: Secondary | ICD-10-CM

## 2021-10-26 DIAGNOSIS — I10 Essential (primary) hypertension: Secondary | ICD-10-CM

## 2021-10-26 DIAGNOSIS — I25118 Atherosclerotic heart disease of native coronary artery with other forms of angina pectoris: Secondary | ICD-10-CM

## 2021-10-26 DIAGNOSIS — I779 Disorder of arteries and arterioles, unspecified: Secondary | ICD-10-CM

## 2021-10-26 MED ORDER — ROSUVASTATIN CALCIUM 20 MG PO TABS
20.0000 mg | ORAL_TABLET | Freq: Every day | ORAL | 3 refills | Status: DC
  Filled 2021-10-26 – 2022-01-09 (×6): qty 90, 90d supply, fill #0
  Filled 2022-04-15: qty 90, 90d supply, fill #1
  Filled 2022-07-08: qty 90, 90d supply, fill #2
  Filled 2022-10-08: qty 90, 90d supply, fill #3

## 2021-10-26 MED ORDER — FUROSEMIDE 40 MG PO TABS
40.0000 mg | ORAL_TABLET | Freq: Two times a day (BID) | ORAL | 3 refills | Status: DC
  Filled 2021-10-26 – 2021-12-12 (×2): qty 180, 90d supply, fill #0
  Filled 2022-04-15: qty 180, 90d supply, fill #1

## 2021-10-26 MED ORDER — AMLODIPINE BESYLATE 5 MG PO TABS
5.0000 mg | ORAL_TABLET | Freq: Every day | ORAL | 3 refills | Status: DC
  Filled 2021-10-26 – 2021-11-01 (×2): qty 90, 90d supply, fill #0

## 2021-10-26 MED ORDER — CLOPIDOGREL BISULFATE 75 MG PO TABS
75.0000 mg | ORAL_TABLET | Freq: Every day | ORAL | 3 refills | Status: DC
  Filled 2021-10-26 – 2022-01-09 (×4): qty 90, 90d supply, fill #0
  Filled 2022-04-15: qty 90, 90d supply, fill #1
  Filled 2022-07-08: qty 90, 90d supply, fill #2
  Filled 2022-10-08: qty 90, 90d supply, fill #3

## 2021-10-26 MED ORDER — METOPROLOL TARTRATE 50 MG PO TABS
50.0000 mg | ORAL_TABLET | Freq: Two times a day (BID) | ORAL | 3 refills | Status: DC
  Filled 2021-10-26 – 2022-01-09 (×2): qty 180, 90d supply, fill #0
  Filled 2022-04-15: qty 180, 90d supply, fill #1
  Filled 2022-07-08: qty 180, 90d supply, fill #2
  Filled 2022-10-08: qty 180, 90d supply, fill #3

## 2021-10-26 MED ORDER — DOXAZOSIN MESYLATE 2 MG PO TABS
2.0000 mg | ORAL_TABLET | Freq: Every day | ORAL | 3 refills | Status: DC
  Filled 2021-10-26 – 2021-12-12 (×2): qty 90, 90d supply, fill #0
  Filled 2022-03-08: qty 90, 90d supply, fill #1
  Filled 2022-06-10: qty 90, 90d supply, fill #2
  Filled 2022-09-10: qty 90, 90d supply, fill #3

## 2021-10-26 MED ORDER — NITROGLYCERIN 0.4 MG SL SUBL
0.4000 mg | SUBLINGUAL_TABLET | SUBLINGUAL | 5 refills | Status: DC | PRN
  Filled 2021-10-26: qty 25, 8d supply, fill #0
  Filled 2022-02-14: qty 25, 7d supply, fill #0
  Filled 2022-03-08: qty 25, 7d supply, fill #1
  Filled 2022-04-15: qty 25, 7d supply, fill #2
  Filled 2022-07-21: qty 25, 7d supply, fill #3
  Filled 2022-09-10: qty 25, 7d supply, fill #4

## 2021-10-26 MED ORDER — POTASSIUM CHLORIDE CRYS ER 20 MEQ PO TBCR
20.0000 meq | EXTENDED_RELEASE_TABLET | Freq: Every day | ORAL | 3 refills | Status: DC
  Filled 2021-10-26 – 2021-12-12 (×2): qty 90, 90d supply, fill #0
  Filled 2022-06-10: qty 90, 90d supply, fill #1

## 2021-10-26 NOTE — Patient Instructions (Signed)
Medication Instructions:  The current medical regimen is effective;  continue present plan and medications.  *If you need a refill on your cardiac medications before your next appointment, please call your pharmacy*  Follow-Up: At [facility], you and your health needs are our priority.  As part of our continuing mission to provide you with exceptional heart care, we have created designated Provider Care Teams.  These Care Teams include your primary Cardiologist (physician) and Advanced Practice Providers (APPs -  Physician Assistants and Nurse Practitioners) who all work together to provide you with the care you need, when you need it.  We recommend signing up for the patient portal called "MyChart".  Sign up information is provided on this After Visit Summary.  MyChart is used to connect with patients for Virtual Visits (Telemedicine).  Patients are able to view lab/test results, encounter notes, upcoming appointments, etc.  Non-urgent messages can be sent to your provider as well.   To learn more about what you can do with MyChart, go to [email or web address].    Your next appointment:   1 year(s)  The format for your next appointment:   In Person  Provider:   Mark Skains, MD      Important Information About Sugar       
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
## 2021-11-01 ENCOUNTER — Other Ambulatory Visit (HOSPITAL_COMMUNITY): Payer: Self-pay

## 2021-11-02 ENCOUNTER — Ambulatory Visit (INDEPENDENT_AMBULATORY_CARE_PROVIDER_SITE_OTHER): Payer: Medicare Other | Admitting: Podiatry

## 2021-11-02 ENCOUNTER — Encounter: Payer: Self-pay | Admitting: Podiatry

## 2021-11-02 DIAGNOSIS — B351 Tinea unguium: Secondary | ICD-10-CM

## 2021-11-02 DIAGNOSIS — M79676 Pain in unspecified toe(s): Secondary | ICD-10-CM

## 2021-11-02 DIAGNOSIS — D689 Coagulation defect, unspecified: Secondary | ICD-10-CM | POA: Diagnosis not present

## 2021-11-02 DIAGNOSIS — E1151 Type 2 diabetes mellitus with diabetic peripheral angiopathy without gangrene: Secondary | ICD-10-CM

## 2021-11-02 NOTE — Progress Notes (Signed)
Presents today chief complaint of painful elongated toenails 1 through 5 bilaterally.  Objective: Toenails are long thick yellow dystrophic with mycotic painful palpation.  No open lesions or wounds are noted.  Assessment: Pain in limb secondary to diabetic peripheral vascular disease and neuropathy.  He also has hammertoe deformities and painful mycotic nails.  Plan: Debrided nails 1 through 5 bilaterally.

## 2021-11-03 ENCOUNTER — Other Ambulatory Visit (HOSPITAL_COMMUNITY): Payer: Self-pay

## 2021-11-06 ENCOUNTER — Other Ambulatory Visit (HOSPITAL_COMMUNITY): Payer: Self-pay

## 2021-11-07 ENCOUNTER — Other Ambulatory Visit (HOSPITAL_COMMUNITY): Payer: Self-pay

## 2021-11-07 MED ORDER — TRESIBA FLEXTOUCH 200 UNIT/ML ~~LOC~~ SOPN
60.0000 [IU] | PEN_INJECTOR | Freq: Every day | SUBCUTANEOUS | 5 refills | Status: DC
  Filled 2021-11-07: qty 18, 60d supply, fill #0
  Filled 2021-11-07: qty 27, 90d supply, fill #0
  Filled 2022-02-04: qty 27, 90d supply, fill #1
  Filled 2022-03-08: qty 27, 90d supply, fill #2

## 2021-11-10 ENCOUNTER — Other Ambulatory Visit (HOSPITAL_COMMUNITY): Payer: Self-pay

## 2021-11-15 ENCOUNTER — Other Ambulatory Visit (HOSPITAL_COMMUNITY): Payer: Self-pay

## 2021-11-16 ENCOUNTER — Other Ambulatory Visit (HOSPITAL_COMMUNITY): Payer: Self-pay

## 2021-11-16 MED ORDER — PREDNISONE 10 MG PO TABS
10.0000 mg | ORAL_TABLET | Freq: Every day | ORAL | 0 refills | Status: DC
  Filled 2021-11-16: qty 7, 7d supply, fill #0

## 2021-11-16 MED ORDER — DOXYCYCLINE HYCLATE 100 MG PO TABS
100.0000 mg | ORAL_TABLET | Freq: Two times a day (BID) | ORAL | 0 refills | Status: DC
  Filled 2021-11-16: qty 14, 7d supply, fill #0

## 2021-11-16 MED ORDER — AZITHROMYCIN 250 MG PO TABS
ORAL_TABLET | ORAL | 0 refills | Status: AC
  Filled 2021-11-16: qty 6, 5d supply, fill #0

## 2021-11-22 ENCOUNTER — Other Ambulatory Visit (HOSPITAL_COMMUNITY): Payer: Self-pay

## 2021-11-22 ENCOUNTER — Other Ambulatory Visit: Payer: Self-pay | Admitting: Pulmonary Disease

## 2021-11-22 MED ORDER — BREZTRI AEROSPHERE 160-9-4.8 MCG/ACT IN AERO: 2.0000 | INHALATION_SPRAY | Freq: Two times a day (BID) | RESPIRATORY_TRACT | 11 refills | Status: DC

## 2021-11-25 ENCOUNTER — Other Ambulatory Visit: Payer: Self-pay | Admitting: Cardiology

## 2021-11-27 ENCOUNTER — Telehealth: Payer: Self-pay | Admitting: Pulmonary Disease

## 2021-11-27 ENCOUNTER — Other Ambulatory Visit (HOSPITAL_COMMUNITY): Payer: Self-pay

## 2021-11-27 MED ORDER — BREZTRI AEROSPHERE 160-9-4.8 MCG/ACT IN AERO
2.0000 | INHALATION_SPRAY | Freq: Two times a day (BID) | RESPIRATORY_TRACT | 11 refills | Status: DC
  Filled 2021-11-27 – 2021-11-28 (×4): qty 10.7, 30d supply, fill #0
  Filled 2021-12-28 – 2022-01-02 (×4): qty 10.7, 30d supply, fill #1
  Filled 2022-02-04: qty 10.7, 30d supply, fill #2
  Filled 2022-03-08: qty 10.7, 30d supply, fill #3
  Filled 2022-04-15: qty 10.7, 30d supply, fill #4
  Filled 2022-06-10: qty 10.7, 30d supply, fill #5
  Filled 2022-07-08: qty 10.7, 30d supply, fill #6
  Filled 2022-09-10: qty 10.7, 30d supply, fill #7

## 2021-11-27 NOTE — Telephone Encounter (Signed)
Rx for pt's breztri has been sent to preferred pharmacy for pt. Called and spoke with pt letting him know this had been done and he verbalized understanding. Nothing further needed.

## 2021-11-28 ENCOUNTER — Other Ambulatory Visit (HOSPITAL_COMMUNITY): Payer: Self-pay

## 2021-12-12 ENCOUNTER — Other Ambulatory Visit (HOSPITAL_COMMUNITY): Payer: Self-pay

## 2021-12-12 ENCOUNTER — Other Ambulatory Visit: Payer: Self-pay

## 2021-12-21 ENCOUNTER — Other Ambulatory Visit (HOSPITAL_COMMUNITY): Payer: Self-pay

## 2021-12-21 MED ORDER — PAXLOVID (150/100) 10 X 150 MG & 10 X 100MG PO TBPK
ORAL_TABLET | ORAL | 0 refills | Status: DC
  Filled 2021-12-21 (×2): qty 20, 5d supply, fill #0

## 2021-12-22 ENCOUNTER — Other Ambulatory Visit: Payer: Self-pay | Admitting: *Deleted

## 2021-12-22 DIAGNOSIS — I6529 Occlusion and stenosis of unspecified carotid artery: Secondary | ICD-10-CM

## 2021-12-28 ENCOUNTER — Other Ambulatory Visit: Payer: Self-pay

## 2021-12-28 ENCOUNTER — Ambulatory Visit (HOSPITAL_COMMUNITY): Payer: Medicare Other

## 2021-12-28 ENCOUNTER — Encounter (HOSPITAL_COMMUNITY): Payer: Medicare Other

## 2021-12-29 ENCOUNTER — Other Ambulatory Visit: Payer: Self-pay

## 2022-01-02 ENCOUNTER — Telehealth: Payer: Self-pay | Admitting: Pulmonary Disease

## 2022-01-02 ENCOUNTER — Other Ambulatory Visit (HOSPITAL_COMMUNITY): Payer: Self-pay

## 2022-01-02 DIAGNOSIS — J449 Chronic obstructive pulmonary disease, unspecified: Secondary | ICD-10-CM

## 2022-01-02 MED ORDER — ALBUTEROL SULFATE (2.5 MG/3ML) 0.083% IN NEBU: INHALATION_SOLUTION | RESPIRATORY_TRACT | 11 refills | Status: AC

## 2022-01-02 NOTE — Telephone Encounter (Signed)
Spoke to a [employer] rep and the rep stated pt needed a new refill script. So, I sent them a new refill script for albuterol 2.5 MG/3ML. Also spoke to pt and informed him that I sent [facility] a new refill albuterol script. Pt verbalized understanding. Nothing further needed.

## 2022-01-02 NOTE — Telephone Encounter (Signed)
PT calling. Needs refill for breathing meds called in to [facility], he said. They need to have an updated prescription, they said. Albuterol.   [phone]

## 2022-01-08 ENCOUNTER — Ambulatory Visit (HOSPITAL_COMMUNITY)
Admission: RE | Admit: 2022-01-08 | Discharge: 2022-01-08 | Disposition: A | Discharge status: Home / Self Care | Payer: Medicare Other | Source: Ambulatory Visit | Attending: Cardiology | Admitting: Cardiology

## 2022-01-08 DIAGNOSIS — I6529 Occlusion and stenosis of unspecified carotid artery: Secondary | ICD-10-CM | POA: Insufficient documentation

## 2022-01-08 DIAGNOSIS — I6523 Occlusion and stenosis of bilateral carotid arteries: Secondary | ICD-10-CM

## 2022-01-09 ENCOUNTER — Other Ambulatory Visit: Payer: Self-pay

## 2022-01-09 ENCOUNTER — Other Ambulatory Visit (HOSPITAL_COMMUNITY): Payer: Self-pay

## 2022-01-10 ENCOUNTER — Other Ambulatory Visit (HOSPITAL_COMMUNITY): Payer: Self-pay

## 2022-01-10 ENCOUNTER — Telehealth: Payer: Self-pay

## 2022-01-10 NOTE — Telephone Encounter (Signed)
Spoke with patient to review results and Dr [name] recommendations. Patient verbalized understanding and had no questions.

## 2022-01-17 ENCOUNTER — Other Ambulatory Visit (HOSPITAL_COMMUNITY): Payer: Self-pay

## 2022-01-22 ENCOUNTER — Other Ambulatory Visit (HOSPITAL_COMMUNITY): Payer: Self-pay

## 2022-01-22 MED ORDER — PREDNISOLONE ACETATE 1 % OP SUSP
1.0000 [drp] | Freq: Four times a day (QID) | OPHTHALMIC | 0 refills | Status: DC
  Filled 2022-01-22: qty 5, 25d supply, fill #0

## 2022-01-22 MED ORDER — TRIAMTERENE-HCTZ 37.5-25 MG PO TABS
1.0000 | ORAL_TABLET | Freq: Every day | ORAL | 3 refills | Status: DC
  Filled 2022-01-22: qty 90, 90d supply, fill #0
  Filled 2022-04-15: qty 90, 90d supply, fill #1
  Filled 2022-07-21: qty 90, 90d supply, fill #2
  Filled 2022-09-15 – 2022-10-08 (×2): qty 90, 90d supply, fill #3

## 2022-01-22 MED ORDER — ERGOCALCIFEROL 1.25 MG (50000 UT) PO CAPS
50000.0000 [IU] | ORAL_CAPSULE | ORAL | 3 refills | Status: DC
  Filled 2022-01-22: qty 24, 84d supply, fill #0
  Filled 2022-04-15: qty 24, 84d supply, fill #1
  Filled 2022-07-08: qty 24, 84d supply, fill #2
  Filled 2022-10-08: qty 24, 84d supply, fill #3

## 2022-02-04 ENCOUNTER — Other Ambulatory Visit (HOSPITAL_COMMUNITY): Payer: Self-pay

## 2022-02-05 ENCOUNTER — Other Ambulatory Visit: Payer: Self-pay

## 2022-02-05 ENCOUNTER — Other Ambulatory Visit (HOSPITAL_COMMUNITY): Payer: Self-pay

## 2022-02-05 MED ORDER — GABAPENTIN 300 MG PO CAPS
ORAL_CAPSULE | ORAL | 1 refills | Status: DC
  Filled 2022-02-05 – 2022-03-08 (×2): qty 270, 90d supply, fill #0
  Filled 2022-06-10: qty 270, 90d supply, fill #1

## 2022-02-05 MED ORDER — OMEPRAZOLE 40 MG PO CPDR
40.0000 mg | DELAYED_RELEASE_CAPSULE | Freq: Two times a day (BID) | ORAL | 4 refills | Status: DC
  Filled 2022-02-05 – 2022-03-09 (×3): qty 180, 90d supply, fill #0
  Filled 2022-06-10: qty 180, 90d supply, fill #1
  Filled 2022-09-10: qty 180, 90d supply, fill #2
  Filled 2022-12-04: qty 180, 90d supply, fill #3

## 2022-02-06 ENCOUNTER — Ambulatory Visit: Payer: Medicare Other | Admitting: Podiatry

## 2022-02-14 ENCOUNTER — Ambulatory Visit (INDEPENDENT_AMBULATORY_CARE_PROVIDER_SITE_OTHER): Payer: Medicare Other | Admitting: Podiatry

## 2022-02-14 ENCOUNTER — Other Ambulatory Visit (HOSPITAL_COMMUNITY): Payer: Self-pay

## 2022-02-14 ENCOUNTER — Encounter: Payer: Self-pay | Admitting: Podiatry

## 2022-02-14 DIAGNOSIS — M79676 Pain in unspecified toe(s): Secondary | ICD-10-CM | POA: Diagnosis not present

## 2022-02-14 DIAGNOSIS — D689 Coagulation defect, unspecified: Secondary | ICD-10-CM | POA: Diagnosis not present

## 2022-02-14 DIAGNOSIS — E1151 Type 2 diabetes mellitus with diabetic peripheral angiopathy without gangrene: Secondary | ICD-10-CM

## 2022-02-14 DIAGNOSIS — B351 Tinea unguium: Secondary | ICD-10-CM

## 2022-02-14 DIAGNOSIS — L97522 Non-pressure chronic ulcer of other part of left foot with fat layer exposed: Secondary | ICD-10-CM

## 2022-02-14 MED ORDER — AMOXICILLIN-POT CLAVULANATE 875-125 MG PO TABS
1.0000 | ORAL_TABLET | Freq: Two times a day (BID) | ORAL | 0 refills | Status: DC
  Filled 2022-02-14: qty 20, 10d supply, fill #0

## 2022-02-14 NOTE — Progress Notes (Addendum)
Presents today chief complaint of painful elongated toenails and an ulceration to the second digit of the left foot.  States that he has been dressing this ulcerative lesion which been present for the past few weeks been putting Neosporin on it and a wrap.  Denies fever chills nausea vomit muscle aches and pains.  States that the redness was on the top of the foot has started to subside.  He also states that his diabetes has been out of control recently.  Objective: Vital signs are stable alert and oriented x 3.  Pulses are barely palpable.  Moderate edema bilaterally.  Ulcerative lesion overlying the PIPJ of the second digit of the left foot appears to be superficial and visible at this time fibrin deposition is present there is no reactive hyperkeratosis minimally tender on palpation.  mild cellulitic process to the level of the metatarsophalangeal joint ulcerative lesion measures 0.9 cm in diameter.  toenails are long thick yellow dystrophic onychomycotic.  assessment: Diabetes with diabetic ulceration and peripheral vascular disease left.  Pain in limb secondary to onychomycosis and diabetic peripheral neuropathy.  Plan: Discussed etiology pathology and surgical therapies debrided wound today.  He will start Betadine dressings daily which I demonstrated to him I started him on Augmentin 1 p.o. twice daily and I also suggested that he utilize a Darco shoe.  I debrided nails 1 through 5 bilaterally we will follow-up with him in 2 to 3 weeks if this worsens x-rays MRI blood work will be necessary.  We are also going to send him for ABI testing.

## 2022-02-16 ENCOUNTER — Other Ambulatory Visit (HOSPITAL_COMMUNITY): Payer: Self-pay

## 2022-02-16 MED ORDER — TRESIBA FLEXTOUCH 200 UNIT/ML ~~LOC~~ SOPN
66.0000 [IU] | PEN_INJECTOR | Freq: Every day | SUBCUTANEOUS | 5 refills | Status: DC
  Filled 2022-03-08: qty 9, fill #0
  Filled 2022-04-15: qty 9, 25d supply, fill #0
  Filled 2022-05-07: qty 9, 25d supply, fill #1
  Filled 2022-06-10: qty 9, 25d supply, fill #2
  Filled 2022-07-08: qty 9, 25d supply, fill #3
  Filled 2022-08-18: qty 9, 25d supply, fill #4
  Filled 2022-09-10: qty 9, 25d supply, fill #5

## 2022-02-19 ENCOUNTER — Ambulatory Visit (HOSPITAL_COMMUNITY)
Admission: RE | Admit: 2022-02-19 | Discharge: 2022-02-19 | Disposition: A | Discharge status: Home / Self Care | Payer: Medicare Other | Source: Ambulatory Visit | Attending: Podiatry | Admitting: Podiatry

## 2022-02-19 DIAGNOSIS — L97522 Non-pressure chronic ulcer of other part of left foot with fat layer exposed: Secondary | ICD-10-CM

## 2022-02-20 ENCOUNTER — Other Ambulatory Visit (HOSPITAL_COMMUNITY): Payer: Self-pay

## 2022-02-20 LAB — VAS US ABI WITH/WO TBI

## 2022-02-27 ENCOUNTER — Other Ambulatory Visit (HOSPITAL_COMMUNITY): Payer: Self-pay

## 2022-02-27 ENCOUNTER — Telehealth: Payer: Self-pay | Admitting: *Deleted

## 2022-02-27 ENCOUNTER — Ambulatory Visit: Payer: Medicare Other | Admitting: Podiatry

## 2022-02-27 ENCOUNTER — Encounter: Payer: Self-pay | Admitting: Podiatry

## 2022-02-27 DIAGNOSIS — L97522 Non-pressure chronic ulcer of other part of left foot with fat layer exposed: Secondary | ICD-10-CM

## 2022-02-27 DIAGNOSIS — E1151 Type 2 diabetes mellitus with diabetic peripheral angiopathy without gangrene: Secondary | ICD-10-CM

## 2022-02-27 MED ORDER — DOXYCYCLINE HYCLATE 100 MG PO TABS: 100.0000 mg | ORAL_TABLET | Freq: Two times a day (BID) | ORAL | 0 refills | Status: DC

## 2022-02-27 NOTE — Telephone Encounter (Signed)
I put the referral in this morning, he was here for his appointment

## 2022-02-27 NOTE — Progress Notes (Signed)
He presents today for follow-up of the second digit of his left foot.  He states that he went for his vascular studies but has not heard any results from the vascular department.  He states that the toe is still tender hurts primarily on the end of the toe but he and his wife continue to dress it daily.  Denies fever chills nausea vomiting.  Does relate some diarrhea with the Augmentin.  Objective: Vital signs are stable alert oriented x 3.  There is no significant change with the exception of some decrease in erythema to the second digit left foot.  Retains some mild purulence dorsally which I cultured today sent out for culture and sensitivity and Gram stain.  I did debride part of the wound today and the eschar still measures about a centimeter total length and about half centimeter total width.  Vascular studies do demonstrate noncompressible vessel bilaterally.  Assessment: Peripheral vascular disease with type 2 diabetes.  Ulceration second toe left foot most likely going to lead to partial amputation of the forefoot.  Plan: Discussed this with patient and his wife in detail today I am going to refer him to Dr. Posey Pronto for wound care or possible surgical intervention.  Due to his comorbidities he will more than likely be a hospital patient.  I am also sending to vascular for evaluation and treatment if possible.  Starting him on doxycycline they will continue to dress daily.  And I am also requesting wound care contact him.

## 2022-02-27 NOTE — Telephone Encounter (Signed)
Where did he need to go for recommendations?

## 2022-02-27 NOTE — Telephone Encounter (Signed)
-----   Message from Rip Harbour, [facility] sent at 02/22/2022  3:53 PM EST -----  ----- Message ----- From: Cedric Fishman Sent: 02/21/2022   3:55 PM EST To: Rip Harbour, [facility]  Can vascular see him for any recommendations.

## 2022-03-02 ENCOUNTER — Ambulatory Visit: Payer: Medicare Other | Admitting: Physician Assistant

## 2022-03-02 VITALS — BP 143/51 | HR 70 | Temp 98.2°F | Resp 20 | Ht 66.0 in

## 2022-03-02 DIAGNOSIS — I779 Disorder of arteries and arterioles, unspecified: Secondary | ICD-10-CM

## 2022-03-02 LAB — WOUND CULTURE
MICRO NUMBER:: 14620578
SPECIMEN QUALITY:: ADEQUATE
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
## 2022-03-05 ENCOUNTER — Other Ambulatory Visit: Payer: Self-pay

## 2022-03-05 DIAGNOSIS — I70245 Atherosclerosis of native arteries of left leg with ulceration of other part of foot: Secondary | ICD-10-CM

## 2022-03-06 ENCOUNTER — Other Ambulatory Visit (HOSPITAL_COMMUNITY): Payer: Self-pay

## 2022-03-06 ENCOUNTER — Telehealth: Payer: Self-pay | Admitting: *Deleted

## 2022-03-06 MED ORDER — CLINDAMYCIN HCL 150 MG PO CAPS
150.0000 mg | ORAL_CAPSULE | Freq: Three times a day (TID) | ORAL | 1 refills | Status: DC
  Filled 2022-03-06: qty 30, 10d supply, fill #0

## 2022-03-06 NOTE — Telephone Encounter (Signed)
Sensitivities suggested clindamycin, sent in new antibiotic to pharmacy

## 2022-03-06 NOTE — Telephone Encounter (Signed)
-----   Message from Garrel Ridgel, [place] sent at 03/06/2022  1:48 PM EST ----- Switch him to doxycycline please.

## 2022-03-08 ENCOUNTER — Other Ambulatory Visit: Payer: Self-pay

## 2022-03-08 ENCOUNTER — Telehealth: Payer: Self-pay | Admitting: *Deleted

## 2022-03-08 ENCOUNTER — Other Ambulatory Visit (HOSPITAL_COMMUNITY): Payer: Self-pay

## 2022-03-08 NOTE — Telephone Encounter (Signed)
Referral for wound care has been sent and patient has been scheduled.

## 2022-03-09 ENCOUNTER — Other Ambulatory Visit (HOSPITAL_COMMUNITY): Payer: Self-pay

## 2022-03-12 ENCOUNTER — Encounter (HOSPITAL_BASED_OUTPATIENT_CLINIC_OR_DEPARTMENT_OTHER): Payer: Medicare Other | Admitting: Internal Medicine

## 2022-03-13 ENCOUNTER — Other Ambulatory Visit: Payer: Self-pay

## 2022-03-13 ENCOUNTER — Ambulatory Visit (HOSPITAL_COMMUNITY)
Admission: RE | Disposition: A | Discharge status: Home / Self Care | Payer: Self-pay | Source: Home / Self Care | Attending: Surgery

## 2022-03-13 ENCOUNTER — Ambulatory Visit: Payer: Medicare Other | Admitting: Podiatry

## 2022-03-13 ENCOUNTER — Ambulatory Visit (HOSPITAL_COMMUNITY)
Admission: RE | Admit: 2022-03-13 | Discharge: 2022-03-13 | Disposition: A | Discharge status: Home / Self Care | Payer: Medicare Other | Attending: Surgery | Admitting: Surgery

## 2022-03-13 DIAGNOSIS — Z833 Family history of diabetes mellitus: Secondary | ICD-10-CM | POA: Diagnosis not present

## 2022-03-13 DIAGNOSIS — I7025 Atherosclerosis of native arteries of other extremities with ulceration: Secondary | ICD-10-CM | POA: Insufficient documentation

## 2022-03-13 DIAGNOSIS — I5030 Unspecified diastolic (congestive) heart failure: Secondary | ICD-10-CM | POA: Diagnosis not present

## 2022-03-13 DIAGNOSIS — I110 Hypertensive heart disease with heart failure: Secondary | ICD-10-CM | POA: Diagnosis not present

## 2022-03-13 DIAGNOSIS — I70245 Atherosclerosis of native arteries of left leg with ulceration of other part of foot: Secondary | ICD-10-CM | POA: Diagnosis not present

## 2022-03-13 DIAGNOSIS — L97529 Non-pressure chronic ulcer of other part of left foot with unspecified severity: Secondary | ICD-10-CM | POA: Insufficient documentation

## 2022-03-13 DIAGNOSIS — E11621 Type 2 diabetes mellitus with foot ulcer: Secondary | ICD-10-CM | POA: Insufficient documentation

## 2022-03-13 DIAGNOSIS — Z794 Long term (current) use of insulin: Secondary | ICD-10-CM | POA: Diagnosis not present

## 2022-03-13 DIAGNOSIS — Z87891 Personal history of nicotine dependence: Secondary | ICD-10-CM | POA: Diagnosis not present

## 2022-03-13 DIAGNOSIS — Z8249 Family history of ischemic heart disease and other diseases of the circulatory system: Secondary | ICD-10-CM | POA: Diagnosis not present

## 2022-03-13 HISTORY — PX: PERIPHERAL VASCULAR INTERVENTION: CATH118257

## 2022-03-13 HISTORY — PX: ABDOMINAL AORTOGRAM W/LOWER EXTREMITY: CATH118223

## 2022-03-13 LAB — GLUCOSE, CAPILLARY
Glucose-Capillary: 164 mg/dL — ABNORMAL HIGH (ref 70–99)
Glucose-Capillary: 147 mg/dL — ABNORMAL HIGH (ref 70–99)
Glucose-Capillary: 124 mg/dL — ABNORMAL HIGH (ref 70–99)

## 2022-03-13 LAB — POCT I-STAT, CHEM 8
BUN: 24 mg/dL — ABNORMAL HIGH (ref 8–23)
Calcium, Ion: 1.18 mmol/L (ref 1.15–1.40)
Chloride: 103 mmol/L (ref 98–111)
Creatinine, Ser: 1.2 mg/dL (ref 0.61–1.24)
Glucose, Bld: 163 mg/dL — ABNORMAL HIGH (ref 70–99)
HCT: 37 % — ABNORMAL LOW (ref 39.0–52.0)
Hemoglobin: 12.6 g/dL — ABNORMAL LOW (ref 13.0–17.0)
Potassium: 4 mmol/L (ref 3.5–5.1)
Sodium: 138 mmol/L (ref 135–145)
TCO2: 27 mmol/L (ref 22–32)

## 2022-03-13 LAB — POCT ACTIVATED CLOTTING TIME
Activated Clotting Time: 277 seconds
Activated Clotting Time: 228 seconds
Activated Clotting Time: 201 seconds
Activated Clotting Time: 163 seconds
Activated Clotting Time: 179 seconds

## 2022-03-13 SURGERY — ABDOMINAL AORTOGRAM W/LOWER EXTREMITY
Anesthesia: LOCAL

## 2022-03-13 MED ORDER — CLOPIDOGREL BISULFATE 300 MG PO TABS
ORAL_TABLET | ORAL | Status: DC | PRN
  Administered 2022-03-13: 75 mg via ORAL

## 2022-03-13 MED ORDER — ASPIRIN 81 MG PO CHEW
CHEWABLE_TABLET | ORAL | Status: AC
  Filled 2022-03-13: qty 1

## 2022-03-13 MED ORDER — HEPARIN (PORCINE) IN NACL 1000-0.9 UT/500ML-% IV SOLN
INTRAVENOUS | Status: DC | PRN
  Administered 2022-03-13 (×2): 500 mL

## 2022-03-13 MED ORDER — FENTANYL CITRATE (PF) 100 MCG/2ML IJ SOLN
INTRAMUSCULAR | Status: AC
  Filled 2022-03-13: qty 2

## 2022-03-13 MED ORDER — HEPARIN SODIUM (PORCINE) 1000 UNIT/ML IJ SOLN
INTRAMUSCULAR | Status: AC
  Filled 2022-03-13: qty 10

## 2022-03-13 MED ORDER — ASPIRIN 81 MG PO TBEC: 81.0000 mg | DELAYED_RELEASE_TABLET | Freq: Every day | ORAL | Status: DC

## 2022-03-13 MED ORDER — OXYCODONE HCL 5 MG PO TABS
5.0000 mg | ORAL_TABLET | ORAL | Status: DC | PRN
  Administered 2022-03-13: 5 mg via ORAL

## 2022-03-13 MED ORDER — MIDAZOLAM HCL 2 MG/2ML IJ SOLN
INTRAMUSCULAR | Status: DC | PRN
  Administered 2022-03-13 (×3): 1 mg via INTRAVENOUS
  Administered 2022-03-13: 2 mg via INTRAVENOUS

## 2022-03-13 MED ORDER — LIDOCAINE HCL (PF) 1 % IJ SOLN
INTRAMUSCULAR | Status: AC
  Filled 2022-03-13: qty 30

## 2022-03-13 MED ORDER — SODIUM CHLORIDE 0.9% FLUSH: 3.0000 mL | INTRAVENOUS | Status: DC | PRN

## 2022-03-13 MED ORDER — LABETALOL HCL 5 MG/ML IV SOLN
INTRAVENOUS | Status: AC
  Filled 2022-03-13: qty 4

## 2022-03-13 MED ORDER — ONDANSETRON HCL 4 MG/2ML IJ SOLN
INTRAMUSCULAR | Status: AC
  Filled 2022-03-13: qty 2

## 2022-03-13 MED ORDER — ASPIRIN 81 MG PO CHEW
CHEWABLE_TABLET | ORAL | Status: DC | PRN
  Administered 2022-03-13: 81 mg via ORAL

## 2022-03-13 MED ORDER — SODIUM CHLORIDE 0.9 % WEIGHT BASED INFUSION
1.0000 mL/kg/h | INTRAVENOUS | Status: DC
  Administered 2022-03-13: 1 mL/kg/h via INTRAVENOUS

## 2022-03-13 MED ORDER — MIDAZOLAM HCL 5 MG/5ML IJ SOLN
INTRAMUSCULAR | Status: AC
  Filled 2022-03-13: qty 5

## 2022-03-13 MED ORDER — HEPARIN SODIUM (PORCINE) 1000 UNIT/ML IJ SOLN
INTRAMUSCULAR | Status: DC | PRN
  Administered 2022-03-13: 10000 [IU] via INTRAVENOUS

## 2022-03-13 MED ORDER — FENTANYL CITRATE (PF) 100 MCG/2ML IJ SOLN
INTRAMUSCULAR | Status: DC | PRN
  Administered 2022-03-13: 25 ug via INTRAVENOUS
  Administered 2022-03-13: 50 ug via INTRAVENOUS
  Administered 2022-03-13 (×2): 25 ug via INTRAVENOUS

## 2022-03-13 MED ORDER — SODIUM CHLORIDE 0.9% FLUSH: 3.0000 mL | Freq: Two times a day (BID) | INTRAVENOUS | Status: DC

## 2022-03-13 MED ORDER — OXYCODONE HCL 5 MG PO TABS
ORAL_TABLET | ORAL | Status: AC
  Filled 2022-03-13: qty 1

## 2022-03-13 MED ORDER — CLOPIDOGREL BISULFATE 75 MG PO TABS: 75.0000 mg | ORAL_TABLET | Freq: Every day | ORAL | Status: DC

## 2022-03-13 MED ORDER — CLOPIDOGREL BISULFATE 75 MG PO TABS
ORAL_TABLET | ORAL | Status: AC
  Filled 2022-03-13: qty 1

## 2022-03-13 MED ORDER — SODIUM CHLORIDE 0.9 % IV SOLN: INTRAVENOUS | Status: DC

## 2022-03-13 MED ORDER — SODIUM CHLORIDE 0.9 % IV SOLN: 250.0000 mL | INTRAVENOUS | Status: DC | PRN

## 2022-03-13 MED ORDER — LIDOCAINE HCL (PF) 1 % IJ SOLN
INTRAMUSCULAR | Status: DC | PRN
  Administered 2022-03-13: 20 mL

## 2022-03-13 MED ORDER — IODIXANOL 320 MG/ML IV SOLN
INTRAVENOUS | Status: DC | PRN
  Administered 2022-03-13: 210 mL

## 2022-03-13 MED ORDER — LABETALOL HCL 5 MG/ML IV SOLN
10.0000 mg | INTRAVENOUS | Status: DC | PRN
  Administered 2022-03-13 (×3): 10 mg via INTRAVENOUS

## 2022-03-13 MED ORDER — ONDANSETRON HCL 4 MG/2ML IJ SOLN
4.0000 mg | Freq: Four times a day (QID) | INTRAMUSCULAR | Status: DC | PRN
  Administered 2022-03-13: 4 mg via INTRAVENOUS

## 2022-03-13 MED ORDER — ACETAMINOPHEN 325 MG PO TABS: 650.0000 mg | ORAL_TABLET | ORAL | Status: DC | PRN

## 2022-03-13 MED ORDER — HYDRALAZINE HCL 20 MG/ML IJ SOLN: 5.0000 mg | INTRAMUSCULAR | Status: DC | PRN

## 2022-03-13 SURGICAL SUPPLY — 32 items
BALLN MUSTANG 5X120X135 (BALLOONS) ×2
BALLN MUSTANG 8.0X40 135 (BALLOONS) ×2
BALLOON MUSTANG 5X120X135 (BALLOONS) IMPLANT
BALLOON MUSTANG 8.0X40 135 (BALLOONS) IMPLANT
CATH MUSTANG 4X200X135 (BALLOONS) IMPLANT
CATH NAVICROSS ANGLED 135CM (MICROCATHETER) IMPLANT
CATH NAVICROSS ST 65CM (CATHETERS) IMPLANT
CATH OMNI FLUSH 5F 65CM (CATHETERS) IMPLANT
CATH QUICKCROSS SUPP .035X90CM (MICROCATHETER) IMPLANT
CATHETER NAVICROSS ST 65CM (CATHETERS) ×2
COVER DOME SNAP 22 D (MISCELLANEOUS) IMPLANT
GUIDEWIRE ANGLED .035X150CM (WIRE) IMPLANT
KIT ENCORE 26 ADVANTAGE (KITS) IMPLANT
KIT MICROPUNCTURE NIT STIFF (SHEATH) IMPLANT
KIT PV (KITS) ×2 IMPLANT
SHEATH CATAPULT 6F 45 MP (SHEATH) IMPLANT
SHEATH PINNACLE 5F 10CM (SHEATH) IMPLANT
SHEATH PINNACLE 6F 10CM (SHEATH) IMPLANT
SHEATH PROBE COVER 6X72 (BAG) IMPLANT
STENT ELUVIA 6X120X130 (Permanent Stent) IMPLANT
STENT ELUVIA 6X150X130 (Permanent Stent) IMPLANT
STENT EPIC VASCULAR 9X40X75 (Permanent Stent) IMPLANT
STENT EPIC VASCULAR 9X60X75 (Permanent Stent) IMPLANT
STOPCOCK MORSE 400PSI 3WAY (MISCELLANEOUS) IMPLANT
SYR MEDRAD MARK 7 150ML (SYRINGE) ×2 IMPLANT
TAPE SHOOT N SEE (TAPE) IMPLANT
TRANSDUCER W/STOPCOCK (MISCELLANEOUS) ×2 IMPLANT
TRAY PV CATH (CUSTOM PROCEDURE TRAY) ×2 IMPLANT
TUBING CIL FLEX 10 FLL-RA (TUBING) IMPLANT
WIRE AMPLATZ SS-J .035X260CM (WIRE) IMPLANT
WIRE HITORQ VERSACORE ST 145CM (WIRE) IMPLANT
WIRE ROSEN-J .035X260CM (WIRE) IMPLANT

## 2022-03-13 NOTE — Discharge Instructions (Signed)
Femoral Site Care This sheet gives you information about how to care for yourself after your procedure. Your health care provider may also give you more specific instructions. If you have problems or questions, contact your health care provider. What can I expect after the procedure?  After the procedure, it is common to have: Bruising that usually fades within 1-2 weeks. Tenderness at the site. Follow these instructions at home: Wound care Follow instructions from your health care provider about how to take care of your insertion site. Make sure you: Wash your hands with soap and water before you change your bandage (dressing). If soap and water are not available, use hand sanitizer. Remove your dressing as told by your health care provider. In 24 hours Do not take baths, swim, or use a hot tub until your health care provider approves. You may shower 24-48 hours after the procedure or as told by your health care provider. Gently wash the site with plain soap and water. Pat the area dry with a clean towel. Do not rub the site. This may cause bleeding. Do not apply powder or lotion to the site. Keep the site clean and dry. Check your femoral site every day for signs of infection. Check for: Redness, swelling, or pain. Fluid or blood. Warmth. Pus or a bad smell. Activity For the first 2-3 days after your procedure, or as long as directed: Avoid climbing stairs as much as possible. Do not squat. Do not lift anything that is heavier than 10 lb (4.5 kg), or the limit that you are told, until your health care provider says that it is safe. For 5 days Rest as directed. Avoid sitting for a long time without moving. Get up to take short walks every 1-2 hours. Do not drive for 24 hours if you were given a medicine to help you relax (sedative). General instructions Take over-the-counter and prescription medicines only as told by your health care provider. Keep all follow-up visits as told by  your health care provider. This is important. Contact a health care provider if you have: A fever or chills. You have redness, swelling, or pain around your insertion site. Get help right away if: The catheter insertion area swells very fast. You pass out. You suddenly start to sweat or your skin gets clammy. The catheter insertion area is bleeding, and the bleeding does not stop when you hold steady pressure on the area. The area near or just beyond the catheter insertion site becomes pale, cool, tingly, or numb. These symptoms may represent a serious problem that is an emergency. Do not wait to see if the symptoms will go away. Get medical help right away. Call your local emergency services (911 in the [place] Do not drive yourself to the hospital. Summary After the procedure, it is common to have bruising that usually fades within 1-2 weeks. Check your femoral site every day for signs of infection. Do not lift anything that is heavier than 10 lb (4.5 kg), or the limit that you are told, until your health care provider says that it is safe. This information is not intended to replace advice given to you by your health care provider. Make sure you discuss any questions you have with your health care provider. Document Revised: 12/31/2016 Document Reviewed: 12/31/2016 Elsevier Patient Education  2020 [employer] 

## 2022-03-13 NOTE — Op Note (Signed)
Patient name: Hunter [name] MRN: [id number] DOB: 1950-08-23 Sex: male  03/13/2022 Pre-operative Diagnosis: Left toe ulcer Post-operative diagnosis:  Same Surgeon:  Annamarie Major Procedure Performed:  1.  Ultrasound-guided access, right femoral artery  2.  Abdominal aortogram  3.  Left lower extremity runoff  4.  Stent, left superficial femoral-popliteal artery  5.  Stent, left external iliac artery  6.  Stent, left common iliac artery  7.  Conscious sedation, 108 minutes   Indications: This is a 72 year old gentleman with a left toe ulcer that is nonhealing.  He comes in today for arterial evaluation.  Procedure:  The patient was identified in the holding area and taken to room 8.  The patient was then placed supine on the table and prepped and draped in the usual sterile fashion.  A time out was called.  Conscious sedation was administered with the use of IV fentanyl and Versed under continuous physician and nurse monitoring.  Heart rate, blood pressure, and oxygen saturation were continuously monitored.  Total sedation time was 108 minutes.  Ultrasound was used to evaluate the right common femoral artery.  It was patent .  A digital ultrasound image was acquired.  A micropuncture needle was used to access the right common femoral artery under ultrasound guidance.  An 018 wire was advanced without resistance and a micropuncture sheath was placed.  The 018 wire was removed and a benson wire was placed.  The micropuncture sheath was exchanged for a 5 french sheath.  An omniflush catheter was advanced over the wire to the level of L-1.  An abdominal angiogram was obtained.  Next, using the omniflush catheter and a benson wire, the aortic bifurcation was crossed and the catheter was placed into theleft external iliac artery and left runoff was obtained.    Findings:   Aortogram: No significant renal artery stenosis.  The infrarenal abdominal aorta is patent without stenosis.  The right  common and external iliac arteries are widely patent.  The left common iliac artery is widely patent.  There is a high-grade, greater than 80% stenosis at the origin of the left external iliac artery.  Right Lower Extremity: Not evaluated due to contrast utilization  Left Lower Extremity:  The left common femoral and profundofemoral artery are patent throughout the course without significant stenosis.  There is a near flush occlusion of the superficial femoral artery with reconstitution of the popliteal artery at the level patella with three-vessel runoff   Intervention: After the above images were acquired the decision was made to proceed with intervention.  A 6 French catapulted sheath was inserted into the left external iliac artery.  The patient was fully heparinized.  Using a 035 Glidewire and a quick cross catheter, subintimal recanalization was performed with reentry just proximal to the joint space.  This was confirmed with a contrast injection.  The subintimal tract was predilated with a 4 mm balloon.  Next overlapping 6 mm Eluvia stents were placed from the joint space at the knee up to the origin of the superficial femoral artery.  These were postdilated with a 5 mm balloon.  Completion imaging revealed inline flow through the leg with no change in runoff.  Attention was then turned towards the proximal extrailiac stenosis.  I selected a 9 x 40 epic stent.  This was initially deployed however did not landed its intended location and so I had to extend proximally with a 9 x 60 epic stent.  This did intentionally cover  the origin of the internal iliac artery and into the common iliac artery.  It was postdilated with a 8 mm balloon.  No significant stenosis was seen on contrast follow-up.  The long sheath was exchanged out for a 6 short sheath and the patient be taken holding it for sheath pull  Impression:  #1  Flush occlusion of the left superficial femoral-popliteal artery successfully treated  with overlapping 6 mm Eluvia stents.  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 Annamarie Major, M.D., [facility] [place] Office: [phone] Pager:  [phone]

## 2022-03-13 NOTE — Interval H&P Note (Signed)
History and Physical Interval Note:  03/13/2022 7:05 AM  Hunter [name]  has presented today for surgery, with the diagnosis of pad w/ ulcer left 2nd toe.  The various methods of treatment have been discussed with the patient and family. After consideration of risks, benefits and other options for treatment, the patient has consented to  Procedure(s): ABDOMINAL AORTOGRAM W/LOWER EXTREMITY (N/A) as a surgical intervention.  The patient's history has been reviewed, patient examined, no change in status, stable for surgery.  I have reviewed the patient's chart and labs.  Questions were answered to the patient's satisfaction.     Annamarie Major

## 2022-03-13 NOTE — Progress Notes (Signed)
Site area- right groin  Site Prior to Removal- 0   Pressure Applied For-  20  MInutes   Bedrest Beginning at - 1458   Manual- Yes   Patient Status During Pull- Stable    Post Pull Groin Site- 0   Post Pull Instructions Given- Yes   Post Pull Pulses Present- Yes    Dressing Applied- Tegaderm and Gauze Dressing    Comments:  pt verbalizes understanding of site care.

## 2022-03-14 ENCOUNTER — Encounter (HOSPITAL_COMMUNITY): Payer: Self-pay | Admitting: Surgery

## 2022-03-14 MED FILL — Midazolam HCl Inj 5 MG/5ML (Base Equivalent): INTRAMUSCULAR | Qty: 5 | Status: AC

## 2022-03-14 MED FILL — Clopidogrel Bisulfate Tab 75 MG (Base Equiv): ORAL | Qty: 1 | Status: AC

## 2022-03-15 ENCOUNTER — Other Ambulatory Visit (HOSPITAL_COMMUNITY): Payer: Self-pay

## 2022-03-16 ENCOUNTER — Other Ambulatory Visit (HOSPITAL_COMMUNITY): Payer: Self-pay

## 2022-03-16 MED ORDER — AMLODIPINE BESYLATE 5 MG PO TABS
5.0000 mg | ORAL_TABLET | Freq: Every day | ORAL | 3 refills | Status: DC
  Filled 2022-03-16: qty 90, 90d supply, fill #0
  Filled 2022-04-15 – 2022-06-19 (×2): qty 90, 90d supply, fill #1
  Filled 2022-09-15: qty 90, 90d supply, fill #2
  Filled 2023-01-01: qty 90, 90d supply, fill #3

## 2022-03-16 MED ORDER — FREESTYLE LIBRE 3 SENSOR MISC
3 refills | Status: DC
  Filled 2022-03-16: qty 6, 84d supply, fill #0
  Filled 2023-01-29: qty 6, 84d supply, fill #1

## 2022-03-19 ENCOUNTER — Other Ambulatory Visit (HOSPITAL_COMMUNITY): Payer: Self-pay

## 2022-03-20 ENCOUNTER — Other Ambulatory Visit (HOSPITAL_COMMUNITY): Payer: Self-pay

## 2022-03-20 ENCOUNTER — Ambulatory Visit: Payer: Medicare Other | Admitting: Podiatry

## 2022-03-20 DIAGNOSIS — E1151 Type 2 diabetes mellitus with diabetic peripheral angiopathy without gangrene: Secondary | ICD-10-CM

## 2022-03-20 DIAGNOSIS — L97522 Non-pressure chronic ulcer of other part of left foot with fat layer exposed: Secondary | ICD-10-CM | POA: Diagnosis not present

## 2022-03-20 MED ORDER — SANTYL 250 UNIT/GM EX OINT
1.0000 | TOPICAL_OINTMENT | Freq: Every day | CUTANEOUS | 0 refills | Status: DC
  Filled 2022-03-20: qty 30, 15d supply, fill #0

## 2022-03-20 NOTE — Progress Notes (Signed)
Subjective:  Patient ID: Hunter [name], male    DOB: February 03, 1950,  MRN: [id number]  Chief Complaint  Patient presents with   Diabetes    2 wk     72 y.o. male presents with the above complaint.  Patient presents with left second digit ulceration with fat layer exposed.  Patient states been present for quite some time.  He had a recent vascular stenting procedure done 2 weeks ago.  He is here for consultation of the wound.  He already has a wound care center appointment in 2 weeks as well.  He is a diabetic   Review of Systems: Negative except as noted in the HPI. Denies N/V/F/Ch.  Past Medical History:  Diagnosis Date   (HFpEF) heart failure with preserved ejection fraction ([place])    Echo 5/22: EF 50-55, no RWMA, moderate LVH, GRII DD, normal RVSF, trivial MR   Bilateral lower extremity edema    CAD (coronary artery disease) cardiologist-  dr [name]   a. Remote MI with stent in 2005;  b. CABG x 5 in 2005;  c. 09/2010 Cath: LM nl, LAD 70p, 90-58m, D1 80p, LCX 2m, OM1 100, OM2 90ost, OM3 80, RCA 50m, VG->PDA->RPL nl, VG->Diag nl, VG->OM1 nl, LIMA->LAD nl, EF 60%-->Med Rx.// Myoview 5/22: EF 46, mod inf infarct, no ischemia    Carotid artery disease (HCC)    Chronic venous insufficiency    post vein harvest for CABG   COPD (chronic obstructive pulmonary disease) ([place])    Diabetes mellitus type 2, controlled ([place])    Difficult intravenous access 06/16/2015   multiple attempt IV starts until use of scanner   ED (erectile dysfunction) of organic origin    Foot drop, bilateral    GERD (gastroesophageal reflux disease)    H/O spinal cord injury    a. more or less wheelchair bound. 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 [name]   S/P CABG x 5    04-28-2003   Type 2 diabetes mellitus (HCC)    Urinary incontinence, urge    Wheelchair bound     Current Outpatient Medications:    collagenase (SANTYL) 250 UNIT/GM ointment, Apply 1 Application topically daily., Disp: 30 g, Rfl: 0   albuterol (PROVENTIL) (2.5 MG/3ML) 0.083% nebulizer solution, USE 1 VIAL IN NEBULIZER EVERY 6 HOURS - and as needed DX: J44.9 (Patient taking differently: Take 2.5 mg by nebulization 2 (two) times daily.), Disp: 360 mL, Rfl: 11   alum hydroxide-mag trisilicate (GAVISCON) [id number] MG CHEW chewable tablet, Chew 2 tablets by mouth 3 (three) times daily as needed for indigestion or heartburn., Disp: , Rfl:  amLODipine (NORVASC) 5 MG tablet, TAKE 1 TABLET BY MOUTH EVERY DAY, Disp: 90 tablet, Rfl: 3   amLODipine (NORVASC) 5 MG tablet, Take 1 tablet (5 mg total) by mouth daily., Disp: 90 tablet, Rfl: 3   aspirin 81 MG tablet, Take 81 mg by mouth at bedtime., Disp: , Rfl:    Budeson-Glycopyrrol-Formoterol (BREZTRI AEROSPHERE) 160-9-4.8 MCG/ACT AERO, Inhale 2 puffs into the lungs in the morning and at bedtime., Disp: 10.7 g, Rfl: 11   Ca Carbonate-Mag Hydroxide (ROLAIDS PO), Take 3 tablets by mouth daily as needed (acid reflux)., Disp: , Rfl:    clopidogrel (PLAVIX) 75 MG tablet, Take 1 tablet (75 mg total) by mouth daily., Disp: 90 tablet, Rfl: 3   Continuous Blood Gluc Sensor (FREESTYLE LIBRE 3 SENSOR) MISC, Change sensor every 14 days to monitor blood glucose continuously, Disp: 6 each, Rfl: 3   doxazosin (CARDURA) 2 MG tablet, Take  1 tablet (2 mg total) by mouth daily., Disp: 90 tablet, Rfl: 3   doxycycline (ADOXA) 100 MG tablet, Take 100 mg by mouth 2 (two) times daily., Disp: , Rfl:    ergocalciferol (VITAMIN D2) 1.25 MG (50000 UT) capsule, Take 1 capsule (50,000 Units total) by mouth 2 (two) times a week every wednesday and sunday, Disp: 24 capsule, Rfl: 3   furosemide (LASIX) 40 MG tablet, Take 1 tablet (40 mg total) by mouth 2 (two) times daily. (Patient taking differently: Take 20 mg by mouth See admin instructions. Take 20 mg daily, may take a second 20 mg dose as needed for swelling), Disp: 180 tablet, Rfl: 3   gabapentin (NEURONTIN) 300 MG capsule, Take 1 capsule (300 mg total) by mouth in the morning AND 2 capsules (600 mg total) every evening., Disp: 270 capsule, Rfl: 1   insulin degludec (TRESIBA FLEXTOUCH) 200 UNIT/ML FlexTouch Pen, Inject 66 Units into the skin daily., Disp: 9 mL, Rfl: 5   insulin lispro (HUMALOG KWIKPEN) 100 UNIT/ML KwikPen, Inject 30 Units with each meal into the skin 3 (three) times daily, Disp: 60 mL, Rfl: 5   Insulin Pen Needle 32G X 4 MM MISC, Use three times daily with humalog and one time daily with tresiba, Disp: 400 each, Rfl: 5   ipratropium (ATROVENT) 0.06 % nasal spray, Place 1 spray into both nostrils daily as needed for rhinitis., Disp: , Rfl:    ketotifen (ZADITOR) 0.025 % ophthalmic solution, Place 2 drops as needed into both eyes (for allergies). , Disp: , Rfl:    loperamide (IMODIUM A-D) 2 MG tablet, Take 1 mg by mouth at bedtime., Disp: , Rfl:    methocarbamol (ROBAXIN) 500 MG tablet, Take 1 tablet (500 mg total) by mouth every evening as needed to reduce pain and muscle spasms, Disp: 90 tablet, Rfl: 3   metoprolol tartrate (LOPRESSOR) 50 MG tablet, Take 1 tablet (50 mg total) by mouth 2 (two) times daily., Disp: 180 tablet, Rfl: 3   montelukast (SINGULAIR) 10 MG tablet, TAKE 1 TABLET BY MOUTH EVERYDAY AT BEDTIME (Patient not taking: Reported on 03/07/2022), Disp: 90 tablet, Rfl:  0   nitroGLYCERIN (NITROSTAT) 0.4 MG SL tablet, Place 1 tablet (0.4 mg total) under the tongue every 5 (five) minutes as needed for chest pain, Disp: 25 tablet, Rfl: 5   NOVOFINE 32G X 6 MM MISC, USE ONCE DAILY WITH LEVEMIR PEN, Disp: , Rfl: 1   omeprazole (PRILOSEC) 40 MG capsule, Take 1 capsule (40 mg total) by mouth 2 (two) times daily., Disp: 180 capsule, Rfl: 4  ONETOUCH VERIO test strip, USE TO SELF MONITOR BLOOD GLUCOSE UP TO 4 TIMES DAILY (E11.65), Disp: , Rfl: 4   Polyvinyl Alcohol-Povidone (REFRESH OP), Place 1 drop into both eyes daily as needed (dry eyes)., Disp: , Rfl:    potassium chloride SA (KLOR-CON M20) 20 MEQ tablet, Take 1 tablet (20 mEq total) by mouth daily., Disp: 90 tablet, Rfl: 3   PROAIR HFA 108 (90 Base) MCG/ACT inhaler, Inhale 2 puffs into the lungs every 4 (four) hours as needed for shortness of breath or wheezing., Disp: , Rfl: 3   Psyllium (METAMUCIL PO), Take 1 Scoop by mouth 2 (two) times daily., Disp: , Rfl:    Respiratory Therapy Supplies (FLUTTER) DEVI, Use as directed, Disp: 1 each, Rfl: 0   rosuvastatin (CRESTOR) 20 MG tablet, Take 1 tablet (20 mg total) by mouth daily., Disp: 90 tablet, Rfl: 3   Semaglutide, 1 MG/DOSE, (OZEMPIC, 1 MG/DOSE,) 4 MG/3ML SOPN, Inject 1 mg into the skin once a week., Disp: 3 mL, Rfl: 11   sodium chloride HYPERTONIC 3 % nebulizer solution, 4 mL inhaled via nebulizer twice daily. (Patient not taking: Reported on 03/07/2022), Disp: 240 mL, Rfl: 3   traMADol (ULTRAM) 50 MG tablet, Take 1 tablet (50 mg total) 3 (three) times daily as needed by mouth for moderate pain., Disp: 30 tablet, Rfl: 4   triamterene-hydrochlorothiazide (MAXZIDE-25) 37.5-25 MG tablet, Take 1 tablet by mouth daily., Disp: 90 tablet, Rfl: 3  Social History   Tobacco Use  Smoking Status Former   Packs/day: 0.50   Years: 40.00   Additional pack years: 0.00   Total pack years: 20.00   Types: Cigarettes   Quit date: 01/01/2009   Years since quitting: 13.2   Passive  exposure: Never  Smokeless Tobacco Never  Tobacco Comments   as of 06-09-2015 per pt last month lite smoker    Allergies  Allergen Reactions   Chantix [Varenicline] Shortness Of Breath   Codeine Nausea And Vomiting and Other (See Comments)    Severe stomach cramps   Amitriptyline Hcl     Unknown   Atorvastatin     Increase LFT's   Glucophage [Metformin] Diarrhea and Nausea And Vomiting   Ranolazine     "nauseated and dizziness"   Lisinopril Cough   Ramipril Cough   Tape Other (See Comments)    Only plastic tape--can use paper tape OK. Blisters   Objective:  There were no vitals filed for this visit. There is no height or weight on file to calculate BMI. Constitutional Well developed. Well nourished.  Vascular Dorsalis pedis pulses nonpalpable bilaterally. Posterior tibial pulses nonpalpable both bilaterally. Capillary refill normal to all digits.  No cyanosis or clubbing noted. Pedal hair growth normal.  Neurologic Normal speech. Oriented to person, place, and time. Epicritic sensation to light touch grossly present bilaterally.  Dermatologic Left second digit dorsal ulceration with fat layer exposed does not probe down to bone no purulent drainage noted.  No erythema noted.  Orthopedic: Normal joint ROM without pain or crepitus bilaterally. No visible deformities. No bony tenderness.   Radiographs: None Assessment:  No diagnosis found. Plan:  Patient was evaluated and treated and all questions answered.  Left second digit ulceration with fat layer exposed -All questions and concerns were discussed with the patient in extensive detail -He has a recent vascular stenting procedure done 2 weeks ago which has improved circulation to the left second toe. -He has a wound care center appointment for which she will keep  the appointment in 2 weeks for further advanced wound care -If there is no improvement we will discuss surgical amputation of the second digit -Continue  Betadine wet-to-dry dressing until wound care center appointment -.  Minimal debridement was carried out  No follow-ups on file.  Left dorsal second toe ulceration with fat layer exposed center wet-to-dry has a wound care center appointment in 2 weeks  He had recent vascular procedure done with stenting a week or 2 ago

## 2022-04-02 ENCOUNTER — Other Ambulatory Visit (HOSPITAL_COMMUNITY): Payer: Self-pay

## 2022-04-02 ENCOUNTER — Other Ambulatory Visit (HOSPITAL_BASED_OUTPATIENT_CLINIC_OR_DEPARTMENT_OTHER): Payer: Self-pay | Admitting: General Surgery

## 2022-04-02 ENCOUNTER — Encounter (HOSPITAL_BASED_OUTPATIENT_CLINIC_OR_DEPARTMENT_OTHER): Payer: Medicare Other | Attending: Internal Medicine | Admitting: General Surgery

## 2022-04-02 DIAGNOSIS — E1142 Type 2 diabetes mellitus with diabetic polyneuropathy: Secondary | ICD-10-CM | POA: Insufficient documentation

## 2022-04-02 DIAGNOSIS — I110 Hypertensive heart disease with heart failure: Secondary | ICD-10-CM | POA: Diagnosis not present

## 2022-04-02 DIAGNOSIS — Z6837 Body mass index (BMI) 37.0-37.9, adult: Secondary | ICD-10-CM | POA: Diagnosis not present

## 2022-04-02 DIAGNOSIS — E1151 Type 2 diabetes mellitus with diabetic peripheral angiopathy without gangrene: Secondary | ICD-10-CM | POA: Insufficient documentation

## 2022-04-02 DIAGNOSIS — Z9981 Dependence on supplemental oxygen: Secondary | ICD-10-CM | POA: Diagnosis not present

## 2022-04-02 DIAGNOSIS — L97524 Non-pressure chronic ulcer of other part of left foot with necrosis of bone: Secondary | ICD-10-CM | POA: Insufficient documentation

## 2022-04-02 DIAGNOSIS — E11621 Type 2 diabetes mellitus with foot ulcer: Secondary | ICD-10-CM | POA: Diagnosis present

## 2022-04-02 DIAGNOSIS — Z87891 Personal history of nicotine dependence: Secondary | ICD-10-CM | POA: Insufficient documentation

## 2022-04-02 DIAGNOSIS — I872 Venous insufficiency (chronic) (peripheral): Secondary | ICD-10-CM | POA: Diagnosis not present

## 2022-04-02 DIAGNOSIS — I5032 Chronic diastolic (congestive) heart failure: Secondary | ICD-10-CM | POA: Insufficient documentation

## 2022-04-02 DIAGNOSIS — J449 Chronic obstructive pulmonary disease, unspecified: Secondary | ICD-10-CM | POA: Diagnosis not present

## 2022-04-02 DIAGNOSIS — I272 Pulmonary hypertension, unspecified: Secondary | ICD-10-CM | POA: Insufficient documentation

## 2022-04-02 MED ORDER — AMOXICILLIN-POT CLAVULANATE 875-125 MG PO TABS
1.0000 | ORAL_TABLET | Freq: Two times a day (BID) | ORAL | 0 refills | Status: DC
  Filled 2022-04-02: qty 56, 28d supply, fill #0

## 2022-04-02 MED ORDER — DOXYCYCLINE HYCLATE 100 MG PO CAPS
100.0000 mg | ORAL_CAPSULE | Freq: Two times a day (BID) | ORAL | 0 refills | Status: DC
  Filled 2022-04-02: qty 56, 28d supply, fill #0

## 2022-04-02 NOTE — Progress Notes (Addendum)
ALAM, BOTA ([id number]) [id number] Page 1 of 9 Visit Report for 04/02/2022 Chief Complaint Document Details Patient Name: Date of Service: Hunter [name]. 04/02/2022 8:00 A M Medical Record Number: [id number] Patient Account Number: [id number] Date of Birth/Sex: Treating RN: 1950-05-31 (72 y.o. M) Primary Care Provider: [name] Other Clinician: Referring Provider: Treating Provider/Extender: Kathlyn Sacramento in Treatment: 0 Information Obtained from: Patient Chief Complaint Patients presents for treatment of an open diabetic ulcer Electronic Signature(s) Signed: 04/02/2022 9:14:15 AM By: Fredirick Maudlin MD FACS Previous Signature: 04/02/2022 7:56:43 AM Version By: Fredirick Maudlin MD FACS Entered By: Fredirick Maudlin on 04/02/2022 09:14:15 -------------------------------------------------------------------------------- Debridement Details Patient Name: Date of Service: Hunter [name], Hunter [name]. 04/02/2022 8:00 A M Medical Record Number: [id number] Patient Account Number: [id number] Date of Birth/Sex: Treating RN: Dec 25, 1950 (72 y.o. [name] Primary Care Provider: [name] Other Clinician: Referring Provider: Treating Provider/Extender: Kathlyn Sacramento in Treatment: 0 Debridement Performed for Assessment: Wound #1 Left T Second oe Performed By: Physician Fredirick Maudlin, MD Debridement Type: Debridement Severity of Tissue Pre Debridement: Fat layer exposed Level of Consciousness (Pre-procedure): Awake and Alert Pre-procedure Verification/Time Out Yes - 08:40 Taken: Start Time: 08:40 Pain Control: Lidocaine 4% T opical Solution T Area Debrided (L x W): otal 2.6 (cm) x 1.2 (cm) = 3.12 (cm) Tissue and other material debrided: Non-Viable, Bone, Other: T endon Level: Skin/Subcutaneous Tissue/Muscle/Bone Debridement Description: Excisional Instrument: Curette Specimen:  Tissue Culture Number of Specimens T aken: 2 Bleeding: Minimum Hemostasis Achieved: Pressure End Time: 08:42 Procedural Pain: 0 Post Procedural Pain: 0 Response to Treatment: Procedure was tolerated well Level of Consciousness (Post- Awake and Alert procedure): Post Debridement Measurements of Total Wound Length: (cm) 2.6 Width: (cm) 1.2 Depth: (cm) 0.3 Volume: (cm) 0.735 Character of Wound/Ulcer Post Debridement: Improved Severity of Tissue Post Debridement: Fat layer exposed Post Procedure Diagnosis Same as Pre-procedure Notes Scribed for Dr. Celine Ahr by J.Scotton Jamarion, [place] ([id number]) [phone].pdf Page 2 of 9 Bone Biopsy-GPA Bone Culture-Vikor Electronic Signature(s) Signed: 04/02/2022 9:25:50 AM By: Dellie Catholic RN Signed: 04/02/2022 9:35:45 AM By: Fredirick Maudlin MD FACS Entered By: Dellie Catholic on 04/02/2022 08:57:52 -------------------------------------------------------------------------------- HPI Details Patient Name: Date of Service: Hunter [name], Hunter [name]. 04/02/2022 8:00 A M Medical Record Number: [id number] Patient Account Number: [id number] Date of Birth/Sex: Treating RN: 15-Aug-1950 (72 y.o. M) Primary Care Provider: [name] Other Clinician: Referring Provider: Treating Provider/Extender: Kathlyn Sacramento in Treatment: 0 History of Present Illness HPI Description: ADMISSION 04/02/2022 This is a 72 year old man with multiple comorbidities including congestive heart failure, chronic ischemic heart disease, COPD, oxygen dependence, diabetes (last hemoglobin A1c 7.2%), morbid obesity, peripheral artery disease, chronic venous insufficiency, and peripheral neuropathy, among others. He routinely follows with podiatry for his diabetic footcare. In February, he was being seen for his regular nail debridement and was noted to have an ulceration on the second digit of his left foot. He had diminished  pedal pulses and he was referred for ABI testing. ABIs were noncompressible bilaterally and he had abnormal diminished TBI's of 0. 4 7 on the right and 0.33 on the left. Based upon these findings, along with a nonhealing ulcer, vascular surgery was consulted. On March 13, 2022, he had an aortogram with left lower extremity runoff. Stents were placed in his left superficial femoral-popliteal artery, left external iliac, and left common iliac artery. Post stenting, he had three-vessel runoff. He saw Dr.  Patel after undergoing the stent procedure. As he already had a wound care consultation scheduled, Dr. Posey Pronto simply had him continue dressing changes until his visit today. On the dorsal surface of his left second toe, there is an ulcer with slough and nonviable tendon visible. On further inspection, the joint space of the bone and the end of the proximal phalanx are both visible. Electronic Signature(s) Signed: 04/02/2022 9:15:24 AM By: Fredirick Maudlin MD FACS Previous Signature: 04/02/2022 8:01:40 AM Version By: Fredirick Maudlin MD FACS Entered By: Fredirick Maudlin on 04/02/2022 09:15:24 -------------------------------------------------------------------------------- Physical Exam Details Patient Name: Date of Service: Hunter [name], Hunter [name]. 04/02/2022 8:00 A M Medical Record Number: [id number] Patient Account Number: [id number] Date of Birth/Sex: Treating RN: 05-23-1950 (72 y.o. M) Primary Care Provider: [name] Other Clinician: Referring Provider: Treating Provider/Extender: Kathlyn Sacramento in Treatment: 0 Constitutional . . . . No acute distress. Respiratory Normal work of breathing on room air. Notes 04/02/2022: On the dorsal surface of his left second toe, there is an ulcer with slough and nonviable tendon visible. On further inspection, the joint space of the bone and the end of the proximal phalanx are both visible. Electronic Signature(s) Signed:  04/02/2022 9:15:54 AM By: Fredirick Maudlin MD FACS Entered By: Fredirick Maudlin on 04/02/2022 09:15:54 -------------------------------------------------------------------------------- Physician Orders Details Patient Name: Date of Service: Hunter [name], Hunter [name]. 04/02/2022 8:00 A M Medical Record Number: [id number] Patient Account Number: [id number] Date of Birth/Sex: Treating RN: [birth year] (72 y.o. Vasili, Janak, [place]ined[place] [name] ([id number]) [id number] Page 3 of 9 Primary Care Provider: [name] Other Clinician: Referring Provider: Treating Provider/Extender: Kathlyn Sacramento in Treatment: 0 Verbal / Phone Orders: No Diagnosis Coding ICD-10 Coding Code Description [phone] 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.621 Type 2 diabetes mellitus with foot ulcer I73.9 Peripheral vascular disease, unspecified I25.9 Chronic ischemic heart disease, unspecified I87.2 Venous insufficiency (chronic) (peripheral) I50.30 Unspecified diastolic (congestive) heart failure I27.20 Pulmonary hypertension, unspecified J44.9 Chronic obstructive pulmonary disease, unspecified Z99.81 Dependence on supplemental oxygen Follow-up Appointments ppointment in 1 week. - Dr. Celine Ahr Room 3 Return A Anesthetic (In clinic) Topical Lidocaine 4% applied to wound bed - Used in Clinic prior to debridement Bathing/ Shower/ Hygiene May shower and wash wound with soap and water. - May change dressing after bathing Edema Control - Lymphedema / SCD / Other Bilateral Lower Extremities Avoid standing for long periods of time. Patient to wear own compression stockings every day. Other Edema Control Orders/Instructions: - Elevate feet throughout the day Off-Loading Other: - Place pillows under ankles to off-load-especially while in bed Wound Treatment Wound #1 - T Second oe Wound Laterality: Left Cleanser: Soap and  Water 1 x Per Day/30 Days Discharge Instructions: May shower and wash wound with dial antibacterial soap and water prior to dressing change. Cleanser: Wound Cleanser (DME) (Generic) 1 x Per Day/30 Days Discharge Instructions: Cleanse the wound with wound cleanser prior to applying a clean dressing using gauze sponges, not tissue or cotton balls. Prim Dressing: Maxorb Extra Calcium Alginate, 2x2 (in/in) (DME) (Generic) 1 x Per Day/30 Days ary Discharge Instructions: Apply to wound bed as instructed Secondary Dressing: Woven Gauze Sponges 2x2 in (DME) (Generic) 1 x Per Day/30 Days Discharge Instructions: Apply over primary dressing as directed. Secured With: Child psychotherapist, Sterile 2x75 (in/in) (DME) (Generic) 1 x Per Day/30 Days Discharge Instructions: Secure with stretch gauze as directed. Secured With:  Paper Tape, 2x10 (in/yd) (DME) (Generic) 1 x Per Day/30 Days Discharge Instructions: Secure dressing with tape as directed. Secured With: Netting, Tubular #3 (DME) (Generic) 1 x Per Day/30 Days Discharge Instructions: apply over dressing Laboratory Bacteria identified in Tissue by Biopsy culture (MICRO) - Left 2nd T Bone Biopsy - (ICD10 L97.524 - Non-pressure chronic ulcer of other part of oe left foot with necrosis of bone) LOINC [id number] Convenience Name: Biopsy specimen culture naerobe culture (MICRO) - BONE CULTURE-Left 2nd toe - (ICD10 L97.524 - Non-pressure Bacteria identified in Unspecified specimen by A chronic ulcer of other part of left foot with necrosis of bone) LOINC Code: [id number] Convenience Name: Anaerobic culture SUMANTH, WOODRING ([id number]) [id number] Page 4 of 9 Patient Medications llergies: codeine, Glucophage, amitriptyline, Chantix, Glucophage, ranolazine, lisinopril, ramipril, adhesive tape, adhesive tape A Notifications Medication Indication Start End 04/02/2022 doxycycline hyclate DOSE oral 100 mg capsule - 1  capsule p.o. twice daily x 28 days 04/02/2022 amoxicillin-pot clavulanate DOSE oral 875 mg-125 mg tablet - 1 tab p.o. twice daily x 28 days Electronic Signature(s) Signed: 04/02/2022 9:25:50 AM By: Dellie Catholic RN Signed: 04/02/2022 9:35:45 AM By: Fredirick Maudlin MD FACS Previous Signature: 04/02/2022 9:17:27 AM Version By: Fredirick Maudlin MD FACS Entered By: Dellie Catholic on 04/02/2022 09:22:38 -------------------------------------------------------------------------------- Problem List Details Patient Name: Date of Service: Hunter [name], Hunter [name]. 04/02/2022 8:00 A M Medical Record Number: [id number] Patient Account Number: [id number] Date of Birth/Sex: Treating RN: [birth year] (72 y.o. M) Primary Care Provider: [name] Other Clinician: Referring Provider: Treating Provider/Extender: Kathlyn Sacramento in Treatment: 0 Active Problems ICD-10 Encounter Code Description Active Date MDM Diagnosis [id number] Non-pressure chronic ulcer of other part of left foot with necrosis of bone 04/02/2022 No Yes E11.621 Type 2 diabetes mellitus with foot ulcer 04/02/2022 No Yes I73.9 Peripheral vascular disease, unspecified 04/02/2022 No Yes I25.9 Chronic ischemic heart disease, unspecified 04/02/2022 No Yes I87.2 Venous insufficiency (chronic) (peripheral) 04/02/2022 No Yes I50.30 Unspecified diastolic (congestive) heart failure 04/02/2022 No Yes I27.20 Pulmonary hypertension, unspecified 04/02/2022 No Yes J44.9 Chronic obstructive pulmonary disease, unspecified 04/02/2022 No Yes Z99.81 Dependence on supplemental oxygen 04/02/2022 No Yes Inactive Problems Resolved Problems ADRYEN, JOHANSSON ([id number]) [id number] Page 5 of 9 Electronic Signature(s) Signed: 04/02/2022 9:14:03 AM By: Fredirick Maudlin MD FACS Previous Signature: 04/02/2022 7:56:32 AM Version By: Fredirick Maudlin MD FACS Entered By: Fredirick Maudlin on 04/02/2022  09:14:03 -------------------------------------------------------------------------------- Progress Note Details Patient Name: Date of Service: Hunter [name], Hunter [name]. 04/02/2022 8:00 A M Medical Record Number: [id number] Patient Account Number: [id number] Date of Birth/Sex: Treating RN: [birth year] (72 y.o. M) Primary Care Provider: [name] Other Clinician: Referring Provider: Treating Provider/Extender: Kathlyn Sacramento in Treatment: 0 Subjective Chief Complaint Information obtained from Patient Patients presents for treatment of an open diabetic ulcer History of Present Illness (HPI) ADMISSION 04/02/2022 This is a 72 year old man with multiple comorbidities including congestive heart failure, chronic ischemic heart disease, COPD, oxygen dependence, diabetes (last hemoglobin A1c 7.2%), morbid obesity, peripheral artery disease, chronic venous insufficiency, and peripheral neuropathy, among others. He routinely follows with podiatry for his diabetic footcare. In February, he was being seen for his regular nail debridement and was noted to have an ulceration on the second digit of his left foot. He had diminished pedal pulses and he was referred for ABI testing. ABIs were noncompressible bilaterally and he had abnormal diminished TBI's of 0. 4 7 on the right and 0.33 on the left. Based  upon these findings, along with a nonhealing ulcer, vascular surgery was consulted. On March 13, 2022, he had an aortogram with left lower extremity runoff. Stents were placed in his left superficial femoral-popliteal artery, left external iliac, and left common iliac artery. Post stenting, he had three-vessel runoff. He saw Dr. Posey Pronto after undergoing the stent procedure. As he already had a wound care consultation scheduled, Dr. Posey Pronto simply had him continue dressing changes until his visit today. On the dorsal surface of his left second toe, there is an ulcer with slough and  nonviable tendon visible. On further inspection, the joint space of the bone and the end of the proximal phalanx are both visible. Patient History Allergies codeine (Severity: Severe), adhesive tape (Severity: Moderate), Glucophage (Severity: Severe), amitriptyline (Severity: Severe), Chantix (Severity: Severe), Glucophage (Severity: Severe), ranolazine (Severity: Severe), lisinopril (Severity: Severe), ramipril (Severity: Severe), adhesive tape (Severity: Mild) Social History Former smoker - ended on 01/01/2009, Marital Status - Married, Alcohol Use - Never, Drug Use - No History, Caffeine Use - Never. Medical History Respiratory Patient has history of Chronic Obstructive Pulmonary Disease (COPD), Sleep Apnea - wears a CPAP Cardiovascular Patient has history of Hypertension, Myocardial Infarction Endocrine Patient has history of Type II Diabetes Neurologic Patient has history of Neuropathy Patient is treated with Insulin. Blood sugar is tested. Hospitalization/Surgery History - CABG (2005);[name] A Surgical History Notes nd Cardiovascular Carotid Artery Disease Chronic Venous Insufficiency Peripheral arterial occlusive disease Gastrointestinal GERD Genitourinary ED (of organic origin) Urinary Incontinence Musculoskeletal Left Foot - Foot Drop Neurologic Left Foot Drop Review of Systems (ROS) Integumentary (Skin) Complains or has symptoms of Wounds - Hx: Cellulitis. Psychiatric Denies complaints or symptoms of Claustrophobia. CHUNG, SUAREZ ([id number]) [id number] Page 6 of 9 Objective Constitutional No acute distress. Vitals Time Taken: 7:55 AM, Height: 66 in, Weight: 235 lbs, BMI: 37.9, Temperature: 98.1 F, Pulse: 69 bpm, Respiratory Rate: 18 breaths/min, Blood Pressure: 131/63 mmHg. Respiratory Normal work of breathing on room air. General Notes: 04/02/2022: On the dorsal surface of his left second toe, there is an ulcer with slough and  nonviable tendon visible. On further inspection, the joint space of the bone and the end of the proximal phalanx are both visible. Integumentary (Hair, Skin) Wound #1 status is Open. Original cause of wound was Gradually Appeared. The date acquired was: 01/25/2022. The wound is located on the Left T Second. oe The wound measures 2.6cm length x 1.2cm width x 0.3cm depth; 2.45cm^2 area and 0.735cm^3 volume. There is bone exposed. There is no tunneling or undermining noted. There is a medium amount of serosanguineous drainage noted. The wound margin is epibole. There is medium (34-66%) pink granulation within the wound bed. There is a medium (34-66%) amount of necrotic tissue within the wound bed including Eschar and Adherent Slough. The periwound skin appearance had no abnormalities noted for texture. The periwound skin appearance had no abnormalities noted for moisture. The periwound skin appearance had no abnormalities noted for color. Periwound temperature was noted as No Abnormality. Assessment Active Problems ICD-10 Non-pressure chronic ulcer of other part of left foot with necrosis of bone Type 2 diabetes mellitus with foot ulcer Peripheral vascular disease, unspecified Chronic ischemic heart disease, unspecified Venous insufficiency (chronic) (peripheral) Unspecified diastolic (congestive) heart failure Pulmonary hypertension, unspecified Chronic obstructive pulmonary disease, unspecified Dependence on supplemental oxygen Procedures Wound #1 Pre-procedure diagnosis of Wound #1 is a Diabetic Wound/Ulcer of the Lower Extremity located on the Left T Second .Severity of Tissue Pre Debridement oe is: Fat  layer exposed. There was a Excisional Skin/Subcutaneous Tissue/Muscle/Bone Debridement with a total area of 3.12 sq cm performed by Duanne Guess, MD. With the following instrument(s): Curette to remove Non-Viable tissue/material. Material removed includes Bone,Other: T endon after  achieving pain control using Lidocaine 4% T opical Solution. 2 specimens were taken by a Tissue Culture and sent to the lab per facility protocol. A time out was conducted at 08:40, prior to the start of the procedure. A Minimum amount of bleeding was controlled with Pressure. The procedure was tolerated well with a pain level of 0 throughout and a pain level of 0 following the procedure. Post Debridement Measurements: 2.6cm length x 1.2cm width x 0.3cm depth; 0.735cm^3 volume. Character of Wound/Ulcer Post Debridement is improved. Severity of Tissue Post Debridement is: Fat layer exposed. Post procedure Diagnosis Wound #1: Same as Pre-Procedure General Notes: Scribed for Dr. Lady Gary by J.Scotton Bone Biopsy-GPA Bone Culture-Vikor. Plan Follow-up Appointments: Return Appointment in 1 week. - Dr. Lady Gary Room 3 Anesthetic: (In clinic) Topical Lidocaine 4% applied to wound bed - Used in Clinic prior to debridement Bathing/ Shower/ Hygiene: May shower and wash wound with soap and water. - May change dressing after bathing Edema Control - Lymphedema / SCD / Other: Avoid standing for long periods of time. Patient to wear own compression stockings every day. Other Edema Control Orders/Instructions: - Elevate feet throughout the day Off-Loading: Other: - Place pillows under ankles to off-load-especially while in bed Laboratory ordered were: Biopsy specimen culture - Left 2nd T Bone Biopsy, Anaerobic culture - BONE CULTURE-Left 2nd toe oe The following medication(s) was prescribed: doxycycline hyclate oral 100 mg capsule 1 capsule p.o. twice daily x 28 days starting 04/02/2022 amoxicillin-pot clavulanate oral 875 mg-125 mg tablet 1 tab p.o. twice daily x 28 days starting 04/02/2022 SHEAN, GERDING ([id number]) [phone].pdf Page 7 of 9 WOUND #1: - T Second Wound Laterality: Left oe Cleanser: Soap and Water 1 x Per Day/30 Days Discharge Instructions: May shower and wash  wound with dial antibacterial soap and water prior to dressing change. Cleanser: Wound Cleanser (DME) (Generic) 1 x Per Day/30 Days Discharge Instructions: Cleanse the wound with wound cleanser prior to applying a clean dressing using gauze sponges, not tissue or cotton balls. Prim Dressing: Maxorb Extra Calcium Alginate, 2x2 (in/in) (DME) (Generic) 1 x Per Day/30 Days ary Discharge Instructions: Apply to wound bed as instructed Secondary Dressing: Woven Gauze Sponges 2x2 in (DME) (Generic) 1 x Per Day/30 Days Discharge Instructions: Apply over primary dressing as directed. Secured With: Insurance underwriter, Sterile 2x75 (in/in) (DME) (Generic) 1 x Per Day/30 Days Discharge Instructions: Secure with stretch gauze as directed. Secured With: Paper Tape, 2x10 (in/yd) (DME) (Generic) 1 x Per Day/30 Days Discharge Instructions: Secure dressing with tape as directed. Secured With: Netting, Tubular #3 (DME) (Generic) 1 x Per Day/30 Days Discharge Instructions: apply over dressing 04/02/2022: This is a 72 year old type II diabetic with an ulcer on his second toe that has been present for about 6 to 8 weeks. He has been recently revascularized, but I am not certain that this toe is salvageable. On the dorsal surface of his left second toe, there is an ulcer with slough and nonviable tendon visible. On further inspection, the joint space of the bone and the end of the proximal phalanx are both visible. I used a curette to debride slough and necrotic tendon from the wound. This exposed the joint space and distal phalanx. I took samples of the bone for both  culture and for pathology. Given the soft crumbly texture of the bone, I am rather certain that he has osteomyelitis. I empirically prescribed a 4-week course of Augmentin and doxycycline, but will tailor antibiotic therapy as indicated by his culture data. He is aware that there is a high likelihood he will require amputation; I am hopeful  that his recent stenting procedure will provide adequate blood flow to heal the site. In the meantime, we will treat the site with silver alginate. He is already wearing a surgical sandal. Follow-up in 1 week. Electronic Signature(s) Signed: 04/06/2022 7:57:18 AM By: Duanne Guess MD FACS Signed: 04/06/2022 1:00:20 PM By: Shawn Stall RN, BSN Previous Signature: 04/02/2022 9:19:34 AM Version By: Duanne Guess MD FACS Entered By: Shawn Stall on 04/05/2022 16:24:25 -------------------------------------------------------------------------------- HxROS Details Patient Name: Date of Service: Hunter [name], Hunter [name]. 04/02/2022 8:00 A M Medical Record Number: [id number] Patient Account Number: [id number] Date of Birth/Sex: Treating RN: 02-17-1950 (72 y.o. [name] Primary Care Provider: Jarome Matin Other Clinician: Referring Provider: Treating Provider/Extender: Marena Chancy in Treatment: 0 Integumentary (Skin) Complaints and Symptoms: Positive for: Wounds - Hx: Cellulitis Psychiatric Complaints and Symptoms: Negative for: Claustrophobia Respiratory Medical History: Positive for: Chronic Obstructive Pulmonary Disease (COPD); Sleep Apnea - wears a CPAP Cardiovascular Medical History: Positive for: Hypertension; Myocardial Infarction Past Medical History Notes: Carotid Artery Disease Chronic Venous Insufficiency Peripheral arterial occlusive disease Gastrointestinal Medical History: Past Medical History Notes: GERD Endocrine Medical HistoryMarland Kitchen SAHAJ, RAJ ([id number]) [id number] Page 8 of 9 Positive for: Type II Diabetes Time with diabetes: 15 years Treated with: Insulin Blood sugar tested every day: Yes Tested : 2 x day Genitourinary Medical History: Past Medical History Notes: ED (of organic origin) Urinary Incontinence Musculoskeletal Medical History: Past Medical History Notes: Left Foot -  Foot Drop Neurologic Medical History: Positive for: Neuropathy Past Medical History Notes: Left Foot Drop Oncologic Immunizations Pneumococcal Vaccine: Received Pneumococcal Vaccination: No Implantable Devices None Hospitalization / Surgery History Type of Hospitalization/Surgery CABG (2005); Family and Social History Former smoker - ended on 01/01/2009; Marital Status - Married; Alcohol Use: Never; Drug Use: No History; Caffeine Use: Never; Financial Concerns: No; Food, Clothing or Shelter Needs: No; Support System Lacking: No; Transportation Concerns: No Electronic Signature(s) Signed: 04/02/2022 9:25:50 AM By: Karie Schwalbe RN Signed: 04/02/2022 9:35:45 AM By: Duanne Guess MD FACS Entered By: Karie Schwalbe on 04/02/2022 08:21:15 -------------------------------------------------------------------------------- SuperBill Details Patient Name: Date of Service: Hunter [name], Hunter [name]. 04/02/2022 Medical Record Number: [id number] Patient Account Number: [id number] Date of Birth/Sex: Treating RN: November 06, 1950 (72 y.o. 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) I50.30 Unspecified diastolic (congestive) heart failure I27.20 Pulmonary hypertension, unspecified J44.9 Chronic obstructive pulmonary disease, unspecified BREVYN, ERSTAD ([id number]) [id number] Page 9 of 9 Z99.81 Dependence on supplemental oxygen Facility Procedures : CPT4 Code: [id number] 9 Description: 9214 - WOUND CARE VISIT-LEV 4 EST  PT Modifier: 25 Quantity: 1 : CPT4 Code: [id number] 1 Description: 1044 - DEB BONE 20 SQ CM/< ICD-10 Diagnosis Description L97.524 Non-pressure chronic ulcer of other part of left foot with necrosis of bone Modifier: Quantity: 1 Physician Procedures : CPT4: Description Modifier  Code [id number] 99204 - WC PHYS LEVEL 4 - NEW PT 25 ICD-10 Diagnosis Description L97.524 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.621 Type 2 diabetes mellitus with foot ulcer I73.9 Peripheral  vascular disease, unspecified I50.30 Unspecified diastolic (congestive) heart failure Quantity: 1 : CPT4: 4540981 Debridement; bone (includes epidermis, dermis, subQ tissue, muscle and/or fascia, if performed) 1st 20 sqcm or less ICD-10 Diagnosis Description L97.524 Non-pressure chronic ulcer of other part of left foot with necrosis of bone Quantity: 1 Electronic Signature(s) Signed: 04/02/2022 9:25:50 AM By: Karie Schwalbe RN Signed: 04/02/2022 9:35:45 AM By: Duanne Guess MD FACS Previous Signature: 04/02/2022 9:20:07 AM Version By: Duanne Guess MD FACS Entered By: Karie Schwalbe on 04/02/2022 09:24:01

## 2022-04-02 NOTE — Progress Notes (Signed)
DECODA, DELMASTRO ([id number]) [id number] Page 1 of 4 Visit Report for 04/02/2022 Abuse Risk Screen Details Patient Name: Date of Service: [name], [name] 04/02/2022 8:00 A M Medical Record Number: [id number] Patient Account Number: [id number] Date of Birth/Sex: Treating RN: 1950/03/12 (72 y.o. Collene [name] Primary Care Stephane Niemann: [name] Other Clinician: Referring Ahmiya Abee: Treating Ronique Simerly/Extender: Kathlyn [place] in Treatment: 0 Abuse Risk Screen Items Answer ABUSE RISK SCREEN: Has anyone close to you tried to hurt or harm you recentlyo No Do you feel uncomfortable with anyone in your familyo No Has anyone forced you do things that you didnt want to doo No Electronic Signature(s) Signed: 04/02/2022 9:25:50 AM By: Dellie Catholic RN Entered By: Dellie Catholic on 04/02/2022 08:21:24 -------------------------------------------------------------------------------- Activities of Daily Living Details Patient Name: Date of Service: [name], [name] 04/02/2022 8:00 A M Medical Record Number: [id number] Patient Account Number: [id number] Date of Birth/Sex: Treating RN: December 05, 1950 (72 y.o. Collene [name] Primary Care Taequan Stockhausen: [name] Other Clinician: Referring Hunter Alvis: Treating Nicci Vaughan/Extender: Kathlyn [place] in Treatment: 0 Activities of Daily Living Items Answer Activities of Daily Living (Please select one for each item) Drive Automobile Need Assistance T Medications ake Need Assistance Use T elephone Completely Able Care for Appearance Completely Able Use T oilet Need Assistance Bath / Shower Need Assistance Dress Self Completely Able Feed Self Completely Able Walk Need Assistance Get In / Out Bed Need Assistance Housework Need Assistance Prepare Meals Need Assistance Handle Money Need Assistance Shop for Self Need Assistance Electronic  Signature(s) Signed: 04/02/2022 9:25:50 AM By: Dellie Catholic RN Entered By: Dellie Catholic on 04/02/2022 08:25:52 -------------------------------------------------------------------------------- Education Screening Details Patient Name: Date of Service: [name], [name] 04/02/2022 8:00 A M Medical Record Number: [id number] Patient Account Number: [id number] Date of Birth/Sex: Treating RN: Jan 20, 1950 (72 y.o. Collene [name] Primary Care Jlon Betker: [name] Other Clinician: Referring Jacquees Gongora: Treating Calvin Jablonowski/Extender: Kathlyn [place] in Treatment: [address] KEAVEN, JAYSON ([id number]) [id number] Page 2 of 4 Primary Learner Assessed: Patient Learning Preferences/Education Level/Primary Language Learning Preference: Explanation, Demonstration Highest Education Level: High School Preferred Language: English Cognitive Barrier Language Barrier: No Translator Needed: No Memory Deficit: No Emotional Barrier: No Cultural/Religious Beliefs Affecting Medical Care: No Physical Barrier Impaired Vision: No Impaired Hearing: No Decreased Hand dexterity: No Knowledge/Comprehension Knowledge Level: High Comprehension Level: High Ability to understand written instructions: High Ability to understand verbal instructions: High Motivation Anxiety Level: Calm Cooperation: Cooperative Education Importance: Acknowledges Need Interest in Health Problems: Asks Questions Perception: Coherent Willingness to Engage in Self-Management High Activities: Readiness to Engage in Self-Management High Activities: Electronic Signature(s) Signed: 04/02/2022 9:25:50 AM By: Dellie Catholic RN Entered By: Dellie Catholic on 04/02/2022 08:26:44 -------------------------------------------------------------------------------- Fall Risk Assessment Details Patient Name: Date of Service: [name], [name] 04/02/2022 8:00 A M Medical  Record Number: [id number] Patient Account Number: [id number] Date of Birth/Sex: Treating RN: Apr 13, 1950 (72 y.o. Collene [name] Primary Care Hollan Philipp: [name] Other Clinician: Referring Foster Sonnier: Treating Talecia Sherlin/Extender: Kathlyn [place] in Treatment: 0 Fall Risk Assessment Items Have you had 2 or more falls in the last 12 monthso 0 No Have you had any fall that resulted in injury in the last 12 monthso 0 No FALLS RISK SCREEN History of falling - immediate or within 3 months 0 No Secondary diagnosis (Do you have 2 or more medical diagnoseso) 0 No Ambulatory aid  None/bed rest/wheelchair/nurse 0 No Crutches/cane/walker 0 No Furniture 0 No Intravenous therapy Access/Saline/Heparin Lock 0 No Gait/Transferring Normal/ bed rest/ wheelchair 0 No Weak (short steps with or without shuffle, stooped but able to lift head while walking, may seek 0 No support from furniture) Impaired (short steps with shuffle, may have difficulty arising from chair, head down, impaired 0 No balance) Mental Status Oriented to own ability 0 No Overestimates or forgets limitations 0 No Risk Level: Low Risk Score: 0 AVIEL, GUTTER R ([id number]) [phone] [id number] Page 3 of 4 Electronic Signature(s) -------------------------------------------------------------------------------- Foot Assessment Details Patient Name: Date of Service: HA [name] RO LD R. 04/02/2022 8:00 A M Medical Record Number: [id number] Patient Account Number: [id number] Date of Birth/Sex: Treating RN: [birth year] (72 y.o. Collene [name] Primary Care Endy Easterly: [name] Other Clinician: Referring Merry Pond: Treating Osa Campoli/Extender: Kathlyn [place] in Treatment: 0 Foot Assessment Items Site Locations + = Sensation present, - = Sensation absent, C = Callus, U = Ulcer R = Redness, W = Warmth, M = Maceration, PU = Pre-ulcerative lesion F =  Fissure, S = Swelling, D = Dryness Assessment Right: Left: Other Deformity: No No Prior Foot Ulcer: No No Prior Amputation: No No Charcot Joint: No No Ambulatory Status: Ambulatory With Help Assistance Device: Wheelchair Gait: Steady Electronic Signature(s) Signed: 04/02/2022 9:25:50 AM By: Dellie Catholic RN Entered By: Dellie Catholic on 04/02/2022 08:28:32 -------------------------------------------------------------------------------- Nutrition Risk Screening Details Patient Name: Date of Service: [name], [name] 04/02/2022 8:00 A M Medical Record Number: [id number] Patient Account Number: [id number] Date of Birth/Sex: Treating RN: 04/21/1950 (72 y.o. Collene [name] Primary Care Mykah Shin: [name] Other Clinician: Referring Maleena Eddleman: Treating Chasitie Passey/Extender: Kathlyn [place] in Treatment: 0 Height (in): 66 Weight (lbs): 235 Body Mass Index (BMI): 37.9 ANGELDEJESUS, VANAMBURG R ([id number]) [phone] [id number] Page 4 of 4 Nutrition Risk Screening Items Score Screening NUTRITION RISK SCREEN: I have an illness or condition that made me change the kind and/or amount of food I eat 0 No I eat fewer than two meals per day 0 No I eat few fruits and vegetables, or milk products 0 No I have three or more drinks of beer, liquor or wine almost every day 0 No I have tooth or mouth problems that make it hard for me to eat 0 No I don't always have enough money to buy the food I need 0 No I eat alone most of the time 0 No I take three or more different prescribed or over-the-counter drugs a day 0 No Without wanting to, I have lost or gained 10 pounds in the last six months 0 No I am not always physically able to shop, cook and/or feed myself 0 No Nutrition Protocols Good Risk Protocol 0 No interventions needed Moderate Risk Protocol High Risk Proctocol Risk Level: Good Risk Score: 0 Electronic Signature(s) Signed:  04/02/2022 9:25:50 AM By: Dellie Catholic RN Entered By: Dellie Catholic on 04/02/2022 08:27:06

## 2022-04-03 NOTE — Progress Notes (Signed)
Hunter [name] ([id number]) [phone].pdf Page 1 of 8 Visit Report for 04/02/2022 Allergy List Details Patient Name: Date of Service: [name], HA Hunter LD Hunter. 04/02/2022 8:00 A M Medical Record Number: [id number] Patient Account Number: [id number] Date of Birth/Sex: Treating RN: [name] (72 y.o. Hunter [name] Primary Care Hunter [name]: Hunter [name] Other Clinician: Referring Hunter [name]: Treating Hunter [name]/Extender: Hunter [name] in Treatment: 0 Allergies Active Allergies codeine Severity: Severe adhesive tape Severity: Moderate Glucophage Severity: Severe amitriptyline Severity: Severe Chantix Severity: Severe Glucophage Severity: Severe ranolazine Severity: Severe lisinopril Severity: Severe ramipril Severity: Severe adhesive tape Severity: Mild Allergy Notes Electronic Signature(s) Signed: 04/02/2022 9:25:50 AM By: Hunter Catholic RN Entered By: Hunter [name] on 04/02/2022 08:02:14 -------------------------------------------------------------------------------- Arrival Information Details Patient Name: Date of Service: [name], HA Hunter LD Hunter. 04/02/2022 8:00 A M Medical Record Number: [id number] Patient Account Number: [id number] Date of Birth/Sex: Treating RN: Hunter [name] (72 y.o. Hunter [name] Primary Care Veron Senner: Hunter [name] Other Clinician: Referring Nakkia Mackiewicz: Treating Hunter [name]/Extender: Hunter [name] in Treatment: 0 Visit Information Patient Arrived: Wheel Chair Arrival Time: 07:53 Accompanied By: spouse Transfer Assistance: Manual Patient Identification Verified: Yes Patient Has Alerts: Yes Patient Alerts: ABI Hunter [name] (03/02/22) ABI L Hunter [name] (03/02/22) Hunter [name] ([id number]) [phone].pdf Page 2 of 8 Diuretic-Lasix Plavix Electronic Signature(s) Signed: 04/02/2022 9:25:50 AM By: Hunter Catholic RN Entered By: Hunter [name] on 04/02/2022  08:04:57 -------------------------------------------------------------------------------- Clinic Level of Care Assessment Details Patient Name: Date of Service: [name] Hunter LD Hunter. 04/02/2022 8:00 A M Medical Record Number: [id number] Patient Account Number: [id number] Date of Birth/Sex: Treating RN: Hunter [name] (72 y.o. Hunter [name] Primary Care Hunter [name]: Hunter [name] Other Clinician: Referring Hunter [name]: Treating Hunter [name]/Extender: Hunter [name] in Treatment: 0 Clinic Level of Care Assessment Items TOOL 1 Quantity Score X- 1 0 Use when EandM and Procedure is performed on INITIAL visit ASSESSMENTS - Nursing Assessment / Reassessment X- 1 20 General Physical Exam (combine w/ comprehensive assessment (listed just below) when performed on new pt. evals) X- 1 25 Comprehensive Assessment (HX, ROS, Risk Assessments, Wounds Hx, etc.) ASSESSMENTS - Wound and Skin Assessment / Reassessment X- 1 10 Dermatologic / Skin Assessment (not related to wound area) ASSESSMENTS - Ostomy and/or Continence Assessment and Care []  - 0 Incontinence Assessment and Management []  - 0 Ostomy Care Assessment and Management (repouching, etc.) PROCESS - Coordination of Care []  - 0 Simple Patient / Family Education for ongoing care X- 1 20 Complex (extensive) Patient / Family Education for ongoing care X- 1 10 Staff obtains Programmer, systems, Records, T Results / Process Orders est X- 1 10 Staff telephones HHA, Nursing Homes / Clarify orders / etc []  - 0 Routine Transfer to another Facility (non-emergent condition) []  - 0 Routine Hospital Admission (non-emergent condition) X- 1 15 New Admissions / Biomedical engineer / Ordering NPWT Apligraf, etc. , []  - 0 Emergency Hospital Admission (emergent condition) PROCESS - Special Needs []  - 0 Pediatric / Minor Patient Management []  - 0 Isolation Patient Management []  - 0 Hearing / Language / Visual special needs []  -  0 Assessment of Community assistance (transportation, D/C planning, etc.) []  - 0 Additional assistance / Altered mentation X- 1 15 Support Surface(s) Assessment (bed, cushion, seat, etc.) INTERVENTIONS - Miscellaneous []  - 0 External ear exam []  - 0 Patient Transfer (multiple staff / Civil Service fast streamer / Similar devices) []  - 0 Simple Staple / Suture removal (25 or less) []  - 0 Complex Staple /  Suture removal (26 or more) []  - 0 Hypo/Hyperglycemic Management (do not check if billed separately) []  - 0 Ankle / Brachial Index (ABI) - do not check if billed separately Hunter [name] ([id number]) [id number].pdf Page 3 of 8 Has the patient been seen at the hospital within the last three years: Yes Total Score: 125 Level Of Care: New/Established - Level 4 Electronic Signature(s) Signed: 04/02/2022 9:25:50 AM By: Hunter Catholic RN Entered By: Hunter [name] on 04/02/2022 09:23:46 -------------------------------------------------------------------------------- Encounter Discharge Information Details Patient Name: Date of Service: [name], HA Hunter LD Hunter. 04/02/2022 8:00 A M Medical Record Number: [id number] Patient Account Number: [id number] Date of Birth/Sex: Treating RN: Hunter [name] (72 y.o. Hunter [name] Primary Care Hunter [name]: Hunter [name] Other Clinician: Referring Hunter [name]: Treating Hunter [name]/Extender: Hunter [name] in Treatment: 0 Encounter Discharge Information Items Post Procedure Vitals Discharge Condition: Stable Temperature (F): 98.1 Ambulatory Status: Wheelchair Pulse (bpm): 69 Discharge Destination: Home Respiratory Rate (breaths/min): 18 Transportation: Private Auto Blood Pressure (mmHg): 131/65 Accompanied By: spouse Schedule Follow-up Appointment: Yes Clinical Summary of Care: Patient Declined Electronic Signature(s) Signed: 04/02/2022 9:25:50 AM By: Hunter Catholic RN Entered By: Hunter [name] on 04/02/2022  09:25:09 -------------------------------------------------------------------------------- Lower Extremity Assessment Details Patient Name: Date of Service: [name], HA Hunter LD Hunter. 04/02/2022 8:00 A M Medical Record Number: [id number] Patient Account Number: [id number] Date of Birth/Sex: Treating RN: [birth year] [name] (72 y.o. Hunter [name] Primary Care Micheline Markes: Hunter [name] Other Clinician: Referring Jasun Gasparini: Treating Lacreasha Hinds/Extender: Hunter [name] in Treatment: 0 Edema Assessment Assessed: Shirlyn Goltz: No] Patrice Paradise: No] [Left: Edema] [Right: :] Calf Left: Right: Point of Measurement: 29 cm From Medial Instep 38 cm Ankle Left: Right: Point of Measurement: 5 cm From Medial Instep 28.5 cm Knee To Floor Left: Right: From Medial Instep 39 cm Vascular Assessment Pulses: Dorsalis Pedis Palpable: [Left:Yes] Electronic Signature(s) Signed: 04/02/2022 9:25:50 AM By: Hunter Catholic RN Entered By: Hunter [name] on 04/02/2022 08:29:21 [name] ([id number]) [id number] Page 4 of 8 -------------------------------------------------------------------------------- Multi Wound Chart Details Patient Name: Date of Service: [name] Hunter LD Hunter. 04/02/2022 8:00 A M Medical Record Number: [id number] Patient Account Number: [id number] Date of Birth/Sex: Treating RN: [name]-04-[birth year] (72 y.o. 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: Extremity Chronic Obstructive Pulmonary N/A  N/A Comorbid History: Disease (COPD), Sleep Apnea, Hypertension, Myocardial Infarction, Type II Diabetes, Neuropathy 01/25/2022 N/A N/A Date Acquired: 0 N/A N/A Weeks of Treatment: Open N/A N/A Wound Status: No N/A N/A Wound Recurrence: 2.6x1.2x0.3 N/A N/A Measurements L x W x D (cm) 2.45 N/A N/A A (cm) : rea 0.735 N/A N/A Volume (cm) : Grade 2 N/A N/A Classification: Medium N/A N/A Exudate A mount: Serosanguineous N/A N/A Exudate Type: red, brown N/A N/A Exudate Color: Epibole N/A N/A Wound Margin: Medium (34-66%) N/A N/A Granulation A mount: Pink N/A N/A Granulation Quality: Medium (34-66%) N/A N/A Necrotic A mount: Eschar, Adherent Slough N/A N/A Necrotic Tissue: Bone: Yes N/A N/A Exposed Structures: None N/A N/A Epithelialization: Debridement - Excisional N/A N/A Debridement: Pre-procedure Verification/Time Out 08:40 N/A N/A Taken: Lidocaine 4% Topical Solution N/A N/A Pain Control: Bone, Other N/A N/A Tissue Debrided: Skin/Subcutaneous  N/A N/A Level: Tissue/Muscle/Bone 3.12 N/A N/A Debridement A (sq cm): rea Curette N/A N/A Instrument: Minimum N/A N/A Bleeding: Pressure N/A N/A Hemostasis Achieved: 0 N/A N/A Procedural Pain: 0 N/A N/A Post Procedural Pain: Debridement Treatment Response: Procedure was tolerated well N/A N/A Post Debridement Measurements L x 2.6x1.2x0.3 N/A N/A W x D (cm) 0.735 N/A N/A Post Debridement Volume: (cm) No Abnormalities Noted N/A N/A Periwound Skin Texture: No Abnormalities Noted N/A N/A Periwound Skin Moisture: No Abnormalities Noted N/A N/A Periwound Skin Color: No Abnormality N/A N/A Temperature: Debridement N/A N/A Procedures Performed: JESHURUN, ZEIDERS ([id number]) [id number] Page 5 of 8 Treatment Notes Electronic Signature(s) Signed: 04/02/2022 9:14:08 AM By: Fredirick Maudlin MD FACS Entered By: Fredirick Maudlin on 04/02/2022  09:14:08 -------------------------------------------------------------------------------- Multi-Disciplinary Care Plan Details Patient Name: Date of Service: [name], HA Hunter LD Hunter. 04/02/2022 8:00 A M Medical Record Number: [id number] Patient Account Number: [id number] Date of Birth/Sex: Treating RN: [name][birth year] (72 y.o. Hunter [name] Primary Care Dakai Braithwaite: Hunter [name] Other Clinician: Referring Kathee Tumlin: Treating Nasiyah Laverdiere/Extender: Hunter [name] in Treatment: 0 Active Inactive Wound/Skin Impairment Nursing Diagnoses: Impaired tissue integrity Goals: Patient/caregiver will verbalize understanding of skin care regimen Date Initiated: 04/02/2022 Target Resolution Date: 07/01/2022 Goal Status: Active Interventions: Assess ulceration(s) every visit Treatment Activities: Skin care regimen initiated : 04/02/2022 Notes: Electronic Signature(s) Signed: 04/02/2022 9:25:50 AM By: Hunter Catholic RN Entered By: Hunter [name] on 04/02/2022 09:21:49 -------------------------------------------------------------------------------- Pain Assessment Details Patient Name: Date of Service: [name], HA Hunter LD Hunter. 04/02/2022 8:00 A M Medical Record Number: [id number] Patient Account Number: [id number] Date of Birth/Sex: Treating RN: [birth year][name] (72 y.o. Hessie Diener Primary Care Verona Hartshorn: Hunter [name] Other Clinician: Referring Lavender Stanke: Treating Jauan Wohl/Extender: Hunter [name] in Treatment: 0 Active Problems Location of Pain Severity and Description of Pain Patient Has Paino Yes Site Locations Pain Location: DARIELL, GEMMEL ([id number]) [phone].pdf Page 6 of 8 Pain Location: Pain in Ulcers Rate the pain. Current Pain Level: 4 Pain Management and Medication Current Pain Management: Medication: No Cold Application: No Rest: No Massage: No Activity: No T.E.N.S.: No Heat Application: No Leg drop  or elevation: No Is the Current Pain Management Adequate: Adequate How does your wound impact your activities of daily livingo Sleep: No Bathing: No Appetite: No Relationship With Others: No Bladder Continence: No Emotions: No Bowel Continence: No Work: No Toileting: No Drive: No Dressing: No Hobbies: No Electronic Signature(s) Signed: 04/02/2022 9:25:50 AM By: Hunter Catholic RN Signed: 04/03/2022 4:09:50 PM By: Deon Pilling RN, BSN Entered By: Hunter [name] on 04/02/2022 08:35:50 -------------------------------------------------------------------------------- Patient/Caregiver Education Details Patient Name: Date of Service: [name], HA Hunter LD Hunter. 4/1/2024andnbsp8:00 A M Medical Record Number: [id number] Patient Account Number: [id number] Date of Birth/Gender: Treating RN: [birth year] (72 y.o. Hunter [name] Primary Care Physician: Hunter [name] Other Clinician: Referring Physician: Treating Physician/Extender: Hunter [name] in Treatment: 0 Education Assessment Education Provided To: Patient Education Topics Provided Wound/Skin Impairment: Methods: Demonstration, Explain/Verbal Responses: Return demonstration correctly Electronic Signature(s) Signed: 04/02/2022 9:25:50 AM By: Hunter Catholic RN Entered By: Hunter [name] on 04/02/2022 09:22:02 -------------------------------------------------------------------------------- Wound Assessment Details Patient Name: Date of Service: [name], HA Hunter LD Hunter. 04/02/2022 8:00 A MURDOC, WINNE ([id number]) [phone].pdf Page 7 of 8 Medical Record Number: [id number] Patient Account Number: [id number] Date of Birth/Sex: Treating RN: [birth year] (72 y.o. Hunter [name] Primary Care Tanga Gloor: Hunter [name] Other Clinician: Referring Sheyann Sulton: Treating Alexandro Line/Extender: Hunter [name] in  Treatment: 0 Wound Status Wound Number: 1 Primary  Diabetic Wound/Ulcer of the Lower Extremity Etiology: Wound Location: Left T Second oe Wound Open Wounding Event: Gradually Appeared Status: Date Acquired: 01/25/2022 Comorbid Chronic Obstructive Pulmonary Disease (COPD), Sleep Apnea, Weeks Of Treatment: 0 History: Hypertension, Myocardial Infarction, Type II Diabetes, Neuropathy Clustered Wound: No Photos Wound Measurements Length: (cm) 2.6 Width: (cm) 1.2 Depth: (cm) 0.3 Area: (cm) 2.45 Volume: (cm) 0.735 % Reduction in Area: % Reduction in Volume: Epithelialization: None Tunneling: No Undermining: No Wound Description Classification: Grade 2 Wound Margin: Epibole Exudate Amount: Medium Exudate Type: Serosanguineous Exudate Color: red, brown Foul Odor After Cleansing: No Slough/Fibrino Yes Wound Bed Granulation Amount: Medium (34-66%) Exposed Structure Granulation Quality: Pink Bone Exposed: Yes Necrotic Amount: Medium (34-66%) Necrotic Quality: Eschar, Adherent Slough Periwound Skin Texture Texture Color No Abnormalities Noted: Yes No Abnormalities Noted: Yes Moisture Temperature / Pain No Abnormalities Noted: Yes Temperature: No Abnormality Treatment Notes Wound #1 (Toe Second) Wound Laterality: Left Cleanser Soap and Water Discharge Instruction: May shower and wash wound with dial antibacterial soap and water prior to dressing change. Wound Cleanser Discharge Instruction: Cleanse the wound with wound cleanser prior to applying a clean dressing using gauze sponges, not tissue or cotton balls. Peri-Wound Care Topical Primary Dressing Maxorb Extra Calcium Alginate, 2x2 (in/in) Discharge Instruction: Apply to wound bed as instructed Secondary Dressing KARSEN, REASONS ([id number]) [phone].pdf Page 8 of 8 Woven Gauze Sponges 2x2 in Discharge Instruction: Apply over primary dressing as directed. Secured With Conforming Stretch Gauze Bandage, Sterile 2x75 (in/in) Discharge  Instruction: Secure with stretch gauze as directed. Paper Tape, 2x10 (in/yd) Discharge Instruction: Secure dressing with tape as directed. Netting, Tubular #3 Discharge Instruction: apply over dressing Compression Wrap Compression Stockings Add-Ons Electronic Signature(s) Signed: 04/02/2022 9:25:50 AM By: Hunter Catholic RN Signed: 04/03/2022 4:09:50 PM By: Deon Pilling RN, BSN Entered By: Deon Pilling on 04/02/2022 08:33:27 -------------------------------------------------------------------------------- Vitals Details Patient Name: Date of Service: [name], HA Hunter LD Hunter. 04/02/2022 8:00 A M Medical Record Number: [id number] Patient Account Number: [id number] Date of Birth/Sex: Treating RN: [birth year] (72 y.o. Hunter [name] Primary Care Tabor Denham: Hunter [name] Other Clinician: Referring Mishti Swanton: Treating Shamus Desantis/Extender: Hunter [name] in Treatment: 0 Vital Signs Time Taken: 07:55 Temperature (F): 98.1 Height (in): 66 Pulse (bpm): 69 Weight (lbs): 235 Respiratory Rate (breaths/min): 18 Body Mass Index (BMI): 37.9 Blood Pressure (mmHg): 131/63 Reference Range: 80 - 120 mg / dl Electronic Signature(s) Signed: 04/02/2022 9:25:50 AM By: Hunter Catholic RN Entered By: Hunter [name] on 04/02/2022 08:33:39

## 2022-04-04 ENCOUNTER — Other Ambulatory Visit (HOSPITAL_COMMUNITY): Payer: Self-pay

## 2022-04-05 ENCOUNTER — Other Ambulatory Visit: Payer: Self-pay | Admitting: *Deleted

## 2022-04-05 DIAGNOSIS — I739 Peripheral vascular disease, unspecified: Secondary | ICD-10-CM

## 2022-04-05 DIAGNOSIS — I779 Disorder of arteries and arterioles, unspecified: Secondary | ICD-10-CM

## 2022-04-05 DIAGNOSIS — I70245 Atherosclerosis of native arteries of left leg with ulceration of other part of foot: Secondary | ICD-10-CM

## 2022-04-05 DIAGNOSIS — I872 Venous insufficiency (chronic) (peripheral): Secondary | ICD-10-CM

## 2022-04-06 ENCOUNTER — Other Ambulatory Visit (HOSPITAL_COMMUNITY): Payer: Self-pay

## 2022-04-06 MED ORDER — FLUTICASONE PROPIONATE 0.005 % EX OINT
TOPICAL_OINTMENT | Freq: Two times a day (BID) | CUTANEOUS | 1 refills | Status: AC | PRN
  Filled 2022-04-06: qty 30, 14d supply, fill #0

## 2022-04-10 ENCOUNTER — Encounter (HOSPITAL_BASED_OUTPATIENT_CLINIC_OR_DEPARTMENT_OTHER): Payer: Medicare Other | Admitting: General Surgery

## 2022-04-10 ENCOUNTER — Other Ambulatory Visit (HOSPITAL_COMMUNITY): Payer: Self-pay

## 2022-04-10 DIAGNOSIS — E1151 Type 2 diabetes mellitus with diabetic peripheral angiopathy without gangrene: Secondary | ICD-10-CM | POA: Diagnosis not present

## 2022-04-11 NOTE — Progress Notes (Signed)
TEODOR, PRATER ([id number]) [id number] Page 1 of 8 Visit Report for 04/10/2022 Chief Complaint Document Details Patient Name: Date of Service: Hunter [name]. 04/10/2022 12:45 PM Medical Record Number: [id number] Patient Account Number: [id number] Date of Birth/Sex: Treating RN: [birth year] (72 y.o. M) Primary Care Provider: Jarome Matin Other Clinician: Referring Provider: Treating Provider/Extender: Marena Chancy in Treatment: 1 Information Obtained from: Patient Chief Complaint Patients presents for treatment of an open diabetic ulcer Electronic Signature(s) Signed: 04/10/2022 1:24:37 PM By: Duanne Guess MD FACS Entered By: Duanne Guess on 04/10/2022 13:24:37 -------------------------------------------------------------------------------- Debridement Details Patient Name: Date of Service: Hunter [name], Hunter [name]. 04/10/2022 12:45 PM Medical Record Number: [id number] Patient Account Number: [id number] Date of Birth/Sex: Treating RN: Aug 20, 1950 (72 y.o. [name] Primary Care Provider: Jarome Matin Other Clinician: Referring Provider: Treating Provider/Extender: Marena Chancy in Treatment: 1 Debridement Performed for Assessment: Wound #1 Left T Second oe Performed By: Physician Duanne Guess, MD Debridement Type: Debridement Severity of Tissue Pre Debridement: Fat layer exposed Level of Consciousness (Pre-procedure): Awake and Alert Pre-procedure Verification/Time Out Yes - 13:02 Taken: Start Time: 13:02 Pain Control: Lidocaine 5% topical ointment T Area Debrided (L x W): otal 1.5 (cm) x 1.1 (cm) = 1.65 (cm) Tissue and other material debrided: Non-Viable, Bone, Slough, T endon, Slough Level: Skin/Subcutaneous Tissue/Muscle/Bone Debridement Description: Excisional Instrument: Curette Bleeding: Minimum Hemostasis Achieved: Pressure End Time: 13:04 Procedural  Pain: 0 Post Procedural Pain: 0 Response to Treatment: Procedure was tolerated well Level of Consciousness (Post- Awake and Alert procedure): Post Debridement Measurements of Total Wound Length: (cm) 1.5 Width: (cm) 1.1 Depth: (cm) 0.3 Volume: (cm) 0.389 Character of Wound/Ulcer Post Debridement: Improved Severity of Tissue Post Debridement: Fat layer exposed Post Procedure Diagnosis Same as Pre-procedure Notes Scribed for Dr. Lady Gary by J.Scotton Electronic Signature(s) DEAGAN, SEVIN [name] ([id number]) [id number] Page 2 of 8 Signed: 04/10/2022 1:31:27 PM By: Duanne Guess MD FACS Signed: 04/10/2022 5:01:04 PM By: Karie Schwalbe RN Entered By: Karie Schwalbe on 04/10/2022 13:06:28 -------------------------------------------------------------------------------- HPI Details Patient Name: Date of Service: Hunter [name], Hunter [name]. 04/10/2022 12:45 PM Medical Record Number: [id number] Patient Account Number: [id number] Date of Birth/Sex: Treating RN: 02-May-1950 (72 y.o. M) Primary Care Provider: Jarome Matin Other Clinician: Referring Provider: Treating Provider/Extender: Marena Chancy in Treatment: 1 History of Present Illness HPI Description: ADMISSION 04/02/2022 This is a 72 year old man with multiple comorbidities including congestive heart failure, chronic ischemic heart disease, COPD, oxygen dependence, diabetes (last hemoglobin A1c 7.2%), morbid obesity, peripheral artery disease, chronic venous insufficiency, and peripheral neuropathy, among others. He routinely follows with podiatry for his diabetic footcare. In February, he was being seen for his regular nail debridement and was noted to have an ulceration on the second digit of his left foot. He had diminished pedal pulses and he was referred for ABI testing. ABIs were noncompressible bilaterally and he had abnormal diminished TBI's of 0. 4 7 on the right and 0.33 on  the left. Based upon these findings, along with a nonhealing ulcer, vascular surgery was consulted. On March 13, 2022, he had an aortogram with left lower extremity runoff. Stents were placed in his left superficial femoral-popliteal artery, left external iliac, and left common iliac artery. Post stenting, he had three-vessel runoff. He saw Dr. Allena Katz after undergoing the stent procedure. As he already had a wound care consultation scheduled, Dr. Allena Katz simply had him continue dressing changes until his visit today.  On the dorsal surface of his left second toe, there is an ulcer with slough and nonviable tendon visible. On further inspection, the joint space of the bone and the end of the proximal phalanx are both visible. 04/10/2022: The biopsy that I took last week showed chronic inflammation, but no osteomyelitis. The wound is cleaner this week with granulation tissue beginning to fill in. There is still a little bit of tendon exposed. Electronic Signature(s) Signed: 04/10/2022 1:25:35 PM By: Duanne Guess MD FACS Entered By: Duanne Guess on 04/10/2022 13:25:35 -------------------------------------------------------------------------------- Physical Exam Details Patient Name: Date of Service: Hunter [name], Hunter [name]. 04/10/2022 12:45 PM Medical Record Number: [id number] Patient Account Number: [id number] Date of Birth/Sex: Treating RN: [birth year] (72 y.o. M) Primary Care Provider: Jarome Matin Other Clinician: Referring Provider: Treating Provider/Extender: Marena Chancy in Treatment: 1 Constitutional . . . . no acute distress. Respiratory Normal work of breathing on room air. Notes 04/10/2022: The wound is cleaner this week with granulation tissue beginning to fill in. There is still a little bit of tendon exposed. Electronic Signature(s) Signed: 04/10/2022 1:26:16 PM By: Duanne Guess MD FACS Entered By: Duanne Guess on 04/10/2022  13:26:16 -------------------------------------------------------------------------------- Physician Orders Details Patient Name: Date of Service: Hunter [name]. 04/10/2022 12:45 PM Medical Record Number: [id number] Patient Account Number: [id number] Date of Birth/Sex: Treating RN: Sep 24, 1950 (72 y.o. [name] Primary Care Provider: Jarome Matin Other Clinician: Referring Provider: Treating Provider/Extender: Earon, Rivest ([id number]) [id number] Page 3 of 8 Weeks in Treatment: 1 Verbal / Phone Orders: No Diagnosis Coding ICD-10 Coding Code Description L97.526 Non-pressure chronic ulcer of other part of left foot with bone involvement without evidence of necrosis E11.621 Type 2 diabetes mellitus with foot ulcer I73.9 Peripheral vascular disease, unspecified I25.9 Chronic ischemic heart disease, unspecified I87.2 Venous insufficiency (chronic) (peripheral) I50.30 Unspecified diastolic (congestive) heart failure I27.20 Pulmonary hypertension, unspecified J44.9 Chronic obstructive pulmonary disease, unspecified Z99.81 Dependence on supplemental oxygen Follow-up Appointments ppointment in 1 week. - Dr. Lady Gary Room 3 Return A Anesthetic (In clinic) Topical Lidocaine 4% applied to wound bed - Used in Clinic prior to debridement Bathing/ Shower/ Hygiene May shower and wash wound with soap and water. - May change dressing after bathing Edema Control - Lymphedema / SCD / Other Bilateral Lower Extremities Avoid standing for long periods of time. Patient to wear own compression stockings every day. Other Edema Control Orders/Instructions: - Elevate feet throughout the day Off-Loading Other: - Place pillows under ankles to off-load-especially while in bed Wound Treatment Wound #1 - T Second oe Wound Laterality: Left Cleanser: Soap and Water 1 x Per Day/30 Days Discharge Instructions: May shower and  wash wound with dial antibacterial soap and water prior to dressing change. Cleanser: Wound Cleanser (Generic) 1 x Per Day/30 Days Discharge Instructions: Cleanse the wound with wound cleanser prior to applying a clean dressing using gauze sponges, not tissue or cotton balls. Prim Dressing: Maxorb Extra Calcium Alginate, 2x2 (in/in) (Generic) 1 x Per Day/30 Days ary Discharge Instructions: Apply to wound bed as instructed Secondary Dressing: Woven Gauze Sponges 2x2 in (Generic) 1 x Per Day/30 Days Discharge Instructions: Apply over primary dressing as directed. Secured With: Insurance underwriter, Sterile 2x75 (in/in) (Generic) 1 x Per Day/30 Days Discharge Instructions: Secure with stretch gauze as directed. Secured With: Paper Tape, 2x10 (in/yd) (Generic) 1 x Per Day/30 Days Discharge Instructions: Secure dressing with tape as directed. Secured With: Netting,  Tubular #2 (Generic) 1 x Per Day/30 Days Discharge Instructions: apply over dressing Electronic Signature(s) Signed: 04/10/2022 1:31:27 PM By: Duanne Guess MD FACS Entered By: Duanne Guess on 04/10/2022 13:28:36 -------------------------------------------------------------------------------- Problem List Details Patient Name: Date of Service: Hunter [name], Hunter [name]. 04/10/2022 12:45 PM Medical Record Number: [id number] Patient Account Number: [id number] Date of Birth/Sex: Treating RN: [birth year] (72 y.o. M) Primary Care Provider: Jarome Matin Other Clinician: Referring Provider: Treating Provider/Extender: Kellee, Kneece ([id number]) [id number] Page 4 of 8 Weeks in Treatment: 1 Active Problems ICD-10 Encounter Code Description Active Date MDM Diagnosis L97.526 Non-pressure chronic ulcer of other part of left foot with bone involvement 04/02/2022 No Yes without evidence of necrosis E11.621 Type 2 diabetes mellitus with foot ulcer 04/02/2022 No  Yes I73.9 Peripheral vascular disease, unspecified 04/02/2022 No Yes I25.9 Chronic ischemic heart disease, unspecified 04/02/2022 No Yes I87.2 Venous insufficiency (chronic) (peripheral) 04/02/2022 No Yes I50.30 Unspecified diastolic (congestive) heart failure 04/02/2022 No Yes I27.20 Pulmonary hypertension, unspecified 04/02/2022 No Yes J44.9 Chronic obstructive pulmonary disease, unspecified 04/02/2022 No Yes Z99.81 Dependence on supplemental oxygen 04/02/2022 No Yes Inactive Problems Resolved Problems Electronic Signature(s) Signed: 04/10/2022 1:24:20 PM By: Duanne Guess MD FACS Entered By: Duanne Guess on 04/10/2022 13:24:20 -------------------------------------------------------------------------------- Progress Note Details Patient Name: Date of Service: Hunter [name], Hunter [name]. 04/10/2022 12:45 PM Medical Record Number: [id number] Patient Account Number: [id number] Date of Birth/Sex: Treating RN: 10-18-1950 (72 y.o. M) Primary Care Provider: Jarome Matin Other Clinician: Referring Provider: Treating Provider/Extender: Marena Chancy in Treatment: 1 Subjective Chief Complaint Information obtained from Patient Patients presents for treatment of an open diabetic ulcer History of Present Illness (HPI) ADMISSION 04/02/2022 RHYLIN, NOSKA ([id number]) [id number] Page 5 of 8 This is a 72 year old man with multiple comorbidities including congestive heart failure, chronic ischemic heart disease, COPD, oxygen dependence, diabetes (last hemoglobin A1c 7.2%), morbid obesity, peripheral artery disease, chronic venous insufficiency, and peripheral neuropathy, among others. He routinely follows with podiatry for his diabetic footcare. In February, he was being seen for his regular nail debridement and was noted to have an ulceration on the second digit of his left foot. He had diminished pedal pulses and he was referred for ABI testing. ABIs  were noncompressible bilaterally and he had abnormal diminished TBI's of 0. 4 7 on the right and 0.33 on the left. Based upon these findings, along with a nonhealing ulcer, vascular surgery was consulted. On March 13, 2022, he had an aortogram with left lower extremity runoff. Stents were placed in his left superficial femoral-popliteal artery, left external iliac, and left common iliac artery. Post stenting, he had three-vessel runoff. He saw Dr. Allena Katz after undergoing the stent procedure. As he already had a wound care consultation scheduled, Dr. Allena Katz simply had him continue dressing changes until his visit today. On the dorsal surface of his left second toe, there is an ulcer with slough and nonviable tendon visible. On further inspection, the joint space of the bone and the end of the proximal phalanx are both visible. 04/10/2022: The biopsy that I took last week showed chronic inflammation, but no osteomyelitis. The wound is cleaner this week with granulation tissue beginning to fill in. There is still a little bit of tendon exposed. Patient History Social History Former smoker - ended on 01/01/2009, Marital Status - Married, Alcohol Use - Never, Drug Use - No History, Caffeine Use - Never. Medical History Respiratory Patient has history of Chronic Obstructive  Pulmonary Disease (COPD), Sleep Apnea - wears a CPAP Cardiovascular Patient has history of Hypertension, Myocardial Infarction Endocrine Patient has history of Type II Diabetes Neurologic Patient has history of Neuropathy Hospitalization/Surgery History - CABG (2005);[name] Surgical History Notes nd Cardiovascular Carotid Artery Disease Chronic Venous Insufficiency Peripheral arterial occlusive disease Gastrointestinal GERD Genitourinary ED (of organic origin) Urinary Incontinence Musculoskeletal Left Foot - Foot Drop Neurologic Left Foot Drop Objective Constitutional no acute distress. Vitals Time Taken: 12:46 PM,  Height: 66 in, Weight: 235 lbs, BMI: 37.9, Temperature: 97.7 F, Pulse: 71 bpm, Respiratory Rate: 18 breaths/min, Blood Pressure: 131/61 mmHg. Respiratory Normal work of breathing on room air. General Notes: 04/10/2022: The wound is cleaner this week with granulation tissue beginning to fill in. There is still a little bit of tendon exposed. Integumentary (Hair, Skin) Wound #1 status is Open. Original cause of wound was Gradually Appeared. The date acquired was: 01/25/2022. The wound has been in treatment 1 weeks. The wound is located on the Left T Second. The wound measures 1.5cm length x 1.1cm width x 0.3cm depth; 1.296cm^2 area and 0.389cm^3 volume. There is oe bone exposed. There is no tunneling or undermining noted. There is a medium amount of serosanguineous drainage noted. The wound margin is epibole. There is medium (34-66%) red, pink granulation within the wound bed. There is a medium (34-66%) amount of necrotic tissue within the wound bed including Adherent Slough. The periwound skin appearance had no abnormalities noted for texture. The periwound skin appearance had no abnormalities noted for moisture. The periwound skin appearance had no abnormalities noted for color. Periwound temperature was noted as No Abnormality. Assessment Active Problems ICD-10 Non-pressure chronic ulcer of other part of left foot with bone involvement without evidence of necrosis Type 2 diabetes mellitus with foot ulcer Peripheral vascular disease, unspecified Chronic ischemic heart disease, unspecified Venous insufficiency (chronic) (peripheral) BLAYDEN, CONWELL ([id number]) [id number] Page 6 of 8 Unspecified diastolic (congestive) heart failure Pulmonary hypertension, unspecified Chronic obstructive pulmonary disease, unspecified Dependence on supplemental oxygen Procedures Wound #1 Pre-procedure diagnosis of Wound #1 is a Diabetic Wound/Ulcer of the Lower Extremity  located on the Left T Second .Severity of Tissue Pre Debridement oe is: Fat layer exposed. There was a Excisional Skin/Subcutaneous Tissue/Muscle/Bone Debridement with a total area of 1.65 sq cm performed by Duanne Guess, MD. With the following instrument(s): Curette to remove Non-Viable tissue/material. Material removed includes Bone,Tendon, and Slough after achieving pain control using Lidocaine 5% topical ointment. No specimens were taken. A time out was conducted at 13:02, prior to the start of the procedure. A Minimum amount of bleeding was controlled with Pressure. The procedure was tolerated well with a pain level of 0 throughout and a pain level of 0 following the procedure. Post Debridement Measurements: 1.5cm length x 1.1cm width x 0.3cm depth; 0.389cm^3 volume. Character of Wound/Ulcer Post Debridement is improved. Severity of Tissue Post Debridement is: Fat layer exposed. Post procedure Diagnosis Wound #1: Same as Pre-Procedure General Notes: Scribed for Dr. Lady Gary by J.Scotton. Plan Follow-up Appointments: Return Appointment in 1 week. - Dr. Lady Gary Room 3 Anesthetic: (In clinic) Topical Lidocaine 4% applied to wound bed - Used in Clinic prior to debridement Bathing/ Shower/ Hygiene: May shower and wash wound with soap and water. - May change dressing after bathing Edema Control - Lymphedema / SCD / Other: Avoid standing for long periods of time. Patient to wear own compression stockings every day. Other Edema Control Orders/Instructions: - Elevate feet throughout the day Off-Loading: Other: -  Place pillows under ankles to off-load-especially while in bed WOUND #1: - T Second Wound Laterality: Left oe Cleanser: Soap and Water 1 x Per Day/30 Days Discharge Instructions: May shower and wash wound with dial antibacterial soap and water prior to dressing change. Cleanser: Wound Cleanser (Generic) 1 x Per Day/30 Days Discharge Instructions: Cleanse the wound with wound cleanser  prior to applying a clean dressing using gauze sponges, not tissue or cotton balls. Prim Dressing: Maxorb Extra Calcium Alginate, 2x2 (in/in) (Generic) 1 x Per Day/30 Days ary Discharge Instructions: Apply to wound bed as instructed Secondary Dressing: Woven Gauze Sponges 2x2 in (Generic) 1 x Per Day/30 Days Discharge Instructions: Apply over primary dressing as directed. Secured With: Insurance underwriterConforming Stretch Gauze Bandage, Sterile 2x75 (in/in) (Generic) 1 x Per Day/30 Days Discharge Instructions: Secure with stretch gauze as directed. Secured With: Paper T ape, 2x10 (in/yd) (Generic) 1 x Per Day/30 Days Discharge Instructions: Secure dressing with tape as directed. Secured With: Netting, Tubular #2 (Generic) 1 x Per Day/30 Days Discharge Instructions: apply over dressing 04/10/2022: The wound is cleaner this week with granulation tissue beginning to fill in. There is still a little bit of tendon exposed. I used a curette to debride the wound. I removed slough, necrotic tendon, and a fragment of bone that was hanging from the joint space. We will continue to apply silver alginate to the wound. He has his vascular studies next week to assess the patency of his stents. Follow-up with me in a week. Electronic Signature(s) Signed: 04/10/2022 1:29:27 PM By: Duanne Guessannon, Ader Fritze MD FACS Entered By: Duanne Guessannon, Doren Kaspar on 04/10/2022 13:29:26 -------------------------------------------------------------------------------- HxROS Details Patient Name: Date of Service: Hunter Edison PaceRELSO N, Hunter [name]. 04/10/2022 12:45 PM Medical Record Number: [id number] Patient Account Number: [id number] Date of Birth/Sex: Treating RN: 06/19/1950 (72 y.o. M) Primary Care Provider: Jarome MatinPaterson, Daniel Other Clinician: Referring Provider: Treating Provider/Extender: Marena Chancyannon, Mikaella Escalona Paterson, Daniel Weeks in Treatment: 1 Hunter [name], Hunter [name] ([id number]) [id number] Page 7 of 8 Respiratory Medical History: Positive  for: Chronic Obstructive Pulmonary Disease (COPD); Sleep Apnea - wears a CPAP Cardiovascular Medical History: Positive for: Hypertension; Myocardial Infarction Past Medical History Notes: Carotid Artery Disease Chronic Venous Insufficiency Peripheral arterial occlusive disease Gastrointestinal Medical History: Past Medical History Notes: GERD Endocrine Medical History: Positive for: Type II Diabetes Time with diabetes: 15 years Treated with: Insulin Blood sugar tested every day: Yes Tested : 2 x day Genitourinary Medical History: Past Medical History Notes: ED (of organic origin) Urinary Incontinence Musculoskeletal Medical History: Past Medical History Notes: Left Foot - Foot Drop Neurologic Medical History: Positive for: Neuropathy Past Medical History Notes: Left Foot Drop Immunizations Pneumococcal Vaccine: Received Pneumococcal Vaccination: No Implantable Devices None Hospitalization / Surgery History Type of Hospitalization/Surgery CABG (2005); Family and Social History Former smoker - ended on 01/01/2009; Marital Status - Married; Alcohol Use: Never; Drug Use: No History; Caffeine Use: Never; Financial Concerns: No; Food, Clothing or Shelter Needs: No; Support System Lacking: No; Transportation Concerns: No Electronic Signature(s) Signed: 04/10/2022 1:31:27 PM By: Duanne Guessannon, Alba Perillo MD FACS Entered By: Duanne Guessannon, Danielle Mink on 04/10/2022 13:25:41 -------------------------------------------------------------------------------- SuperBill Details Patient Name: Date of Service: Hunter Edison PaceRELSO N, Hunter [name]. 04/10/2022 Medical Record Number: [id number] Patient Account Number: [id number] Date of Birth/Sex: Treating RN: 12/04/1950 (72 y.o. M) Primary Care Provider: Jarome MatinPaterson, Daniel Other Clinician: Lacretia [name], Seve [name] ([id number]) [id number] Page 8 of 8 Referring Provider: Treating Provider/Extender: Marena Chancyannon, Kennie Karapetian Paterson, Daniel Weeks in  Treatment: 1 Diagnosis Coding ICD-10 Codes Code Description [phone] Non-pressure chronic ulcer  of other part of left foot with bone involvement without evidence of necrosis E11.621 Type 2 diabetes mellitus with foot ulcer I73.9 Peripheral vascular disease, unspecified I25.9 Chronic ischemic heart disease, unspecified I87.2 Venous insufficiency (chronic) (peripheral) I50.30 Unspecified diastolic (congestive) heart failure I27.20 Pulmonary hypertension, unspecified J44.9 Chronic obstructive pulmonary disease, unspecified Z99.81 Dependence on supplemental oxygen Facility Procedures : CPT4 Code: [id number] Description: 11044 - DEB BONE 20 SQ CM/< ICD-10 Diagnosis Description L97.526 Non-pressure chronic ulcer of other part of left foot with bone involvement Modifier: without evidence of nec Quantity: 1 rosis Physician Procedures : CPT4: Description Modifier Code [id number] 12458 - WC PHYS LEVEL 3 - EST PT 25 ICD-10 Diagnosis Description L97.526 Non-pressure chronic ulcer of other part of left foot with bone involvement without evidence of necrosi E11.621 Type 2 diabetes mellitus with  foot ulcer I73.9 Peripheral vascular disease, unspecified I87.2 Venous insufficiency (chronic) (peripheral) Quantity: 1 s : CPT4: 0998338 Debridement; bone (includes epidermis, dermis, subQ tissue, muscle and/or fascia, if performed) 1st 20 sqcm or less ICD-10 Diagnosis Description L97.526 Non-pressure chronic ulcer of other part of left foot with bone involvement without  evidence of necrosi Quantity: 1 s Electronic Signature(s) Signed: 04/10/2022 1:30:11 PM By: Duanne Guess MD FACS Entered By: Duanne Guess on 04/10/2022 13:30:11
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
## 2022-04-16 ENCOUNTER — Ambulatory Visit (INDEPENDENT_AMBULATORY_CARE_PROVIDER_SITE_OTHER)
Admission: RE | Admit: 2022-04-16 | Discharge: 2022-04-16 | Disposition: A | Discharge status: Home / Self Care | Payer: Medicare Other | Source: Ambulatory Visit | Attending: Surgery | Admitting: Surgery

## 2022-04-16 ENCOUNTER — Other Ambulatory Visit (HOSPITAL_COMMUNITY): Payer: Self-pay

## 2022-04-16 ENCOUNTER — Ambulatory Visit (HOSPITAL_COMMUNITY)
Admission: RE | Admit: 2022-04-16 | Discharge: 2022-04-16 | Disposition: A | Discharge status: Home / Self Care | Payer: Medicare Other | Source: Ambulatory Visit | Attending: Surgery | Admitting: Surgery

## 2022-04-16 ENCOUNTER — Other Ambulatory Visit: Payer: Self-pay

## 2022-04-16 ENCOUNTER — Encounter: Payer: Self-pay | Admitting: Physician Assistant

## 2022-04-16 ENCOUNTER — Ambulatory Visit (INDEPENDENT_AMBULATORY_CARE_PROVIDER_SITE_OTHER): Payer: Medicare Other | Admitting: Physician Assistant

## 2022-04-16 VITALS — BP 145/65 | HR 69 | Temp 98.2°F | Resp 16 | Ht 66.0 in | Wt 230.0 lb

## 2022-04-16 DIAGNOSIS — I872 Venous insufficiency (chronic) (peripheral): Secondary | ICD-10-CM | POA: Insufficient documentation

## 2022-04-16 DIAGNOSIS — I70245 Atherosclerosis of native arteries of left leg with ulceration of other part of foot: Secondary | ICD-10-CM | POA: Diagnosis present

## 2022-04-16 DIAGNOSIS — I739 Peripheral vascular disease, unspecified: Secondary | ICD-10-CM | POA: Diagnosis present

## 2022-04-16 DIAGNOSIS — I779 Disorder of arteries and arterioles, unspecified: Secondary | ICD-10-CM | POA: Diagnosis present

## 2022-04-16 LAB — VAS US ABI WITH/WO TBI

## 2022-04-17 ENCOUNTER — Other Ambulatory Visit (HOSPITAL_COMMUNITY): Payer: Self-pay

## 2022-04-18 ENCOUNTER — Other Ambulatory Visit (HOSPITAL_COMMUNITY): Payer: Self-pay

## 2022-04-18 MED ORDER — ALBUTEROL SULFATE HFA 108 (90 BASE) MCG/ACT IN AERS
2.0000 | INHALATION_SPRAY | RESPIRATORY_TRACT | 5 refills | Status: DC | PRN
  Filled 2022-04-18: qty 6.7, 30d supply, fill #0
  Filled 2022-06-10: qty 6.7, 30d supply, fill #1
  Filled 2022-07-08: qty 6.7, 30d supply, fill #2
  Filled 2022-07-21 – 2022-08-18 (×2): qty 6.7, 30d supply, fill #3
  Filled 2022-09-10: qty 6.7, 17d supply, fill #4
  Filled 2022-09-24: qty 6.7, 17d supply, fill #5

## 2022-04-18 MED ORDER — IPRATROPIUM BROMIDE 0.06 % NA SOLN
2.0000 | Freq: Three times a day (TID) | NASAL | 1 refills | Status: DC | PRN
  Filled 2022-04-18: qty 45, 75d supply, fill #0
  Filled 2022-04-18: qty 30, 50d supply, fill #0
  Filled 2022-04-18: qty 15, 25d supply, fill #0
  Filled 2022-06-10 – 2022-07-21 (×2): qty 45, 75d supply, fill #1

## 2022-04-18 MED ORDER — ONETOUCH VERIO VI STRP
ORAL_STRIP | 3 refills | Status: AC
  Filled 2022-04-18 (×2): qty 400, 100d supply, fill #0
  Filled 2022-09-24: qty 400, 100d supply, fill #1
  Filled 2023-01-29: qty 400, 100d supply, fill #2

## 2022-04-18 MED ORDER — TRAMADOL HCL 50 MG PO TABS
50.0000 mg | ORAL_TABLET | Freq: Three times a day (TID) | ORAL | 0 refills | Status: DC | PRN
  Filled 2022-04-18: qty 21, 7d supply, fill #0
  Filled 2022-05-07: qty 90, 30d supply, fill #0

## 2022-04-19 ENCOUNTER — Encounter (HOSPITAL_BASED_OUTPATIENT_CLINIC_OR_DEPARTMENT_OTHER): Payer: Medicare Other | Admitting: General Surgery

## 2022-04-19 ENCOUNTER — Other Ambulatory Visit (HOSPITAL_COMMUNITY): Payer: Self-pay

## 2022-04-19 NOTE — Progress Notes (Signed)
HARVE, SPRADLEY ([id number]) [id number] Page 1 of 6 Visit Report for 04/10/2022 Arrival Information Details Patient Name: Date of Service: Hunter [name] 04/10/2022 12:45 PM Medical Record Number: [id number] Patient Account Number: [id number] Date of Birth/Sex: Treating RN: 07-Jun-1950 (72 y.o. Hunter [name] Primary Care Hunter [name]: Hunter [name] Other Clinician: Referring Hunter [name]: Treating Hunter [name] in Treatment: 1 Visit Information History Since Last Visit Added or deleted any medications: No Patient Arrived: Wheel Chair Any new allergies or adverse reactions: No Arrival Time: 12:45 Had a fall or experienced change in No Accompanied By: spouse activities of daily living that may affect Transfer Assistance: None risk of falls: Secondary Verification Process Completed: Yes Signs or symptoms of abuse/neglect since last visito No Patient Has Alerts: Yes Hospitalized since last visit: No Patient Alerts: ABI R [place] (03/02/22) Implantable device outside of the clinic excluding No ABI L [place] (03/02/22) cellular tissue based products placed in the center Diuretic-Lasix since last visit: Plavix Has Dressing in Place as Prescribed: Yes Pain Present Now: No Electronic Signature(s) Signed: 04/10/2022 5:01:04 PM By: Karie Schwalbe RN Entered By: Karie Schwalbe on 04/10/2022 12:51:51 -------------------------------------------------------------------------------- Encounter Discharge Information Details Patient Name: Date of Service: Hunter [name], Hunter RO LD R. 04/10/2022 12:45 PM Medical Record Number: [id number] Patient Account Number: [id number] Date of Birth/Sex: Treating RN: 1950-10-28 (72 y.o. Hunter [name] Primary Care Hondo Nanda: Hunter [name] Other Clinician: Referring Tienna Bienkowski: Treating Illene Sweeting/Extender: Marena [name] in Treatment: 1 Encounter Discharge Information  Items Post Procedure Vitals Discharge Condition: Stable Temperature (F): 97.7 Ambulatory Status: Wheelchair Pulse (bpm): 71 Discharge Destination: Home Respiratory Rate (breaths/min): 18 Transportation: Private Auto Blood Pressure (mmHg): 131/61 Accompanied By: spouse Schedule Follow-up Appointment: Yes Clinical Summary of Care: Patient Declined Electronic Signature(s) Signed: 04/10/2022 5:01:04 PM By: Karie Schwalbe RN Entered By: Karie Schwalbe on 04/10/2022 16:30:37 -------------------------------------------------------------------------------- Lower Extremity Assessment Details Patient Name: Date of Service: Hunter [name] 04/10/2022 12:45 PM Medical Record Number: [id number] Patient Account Number: [id number] Date of Birth/Sex: Treating RN: May 27, 1950 (72 y.o. Hunter [name] Primary Care Amarian Botero: Hunter [name] Other Clinician: Referring Hunter [name]: Treating Hunter [name] in Treatment: 1 Edema Assessment Assessed: Hunter [name]: No] [Right: No] [name], [name] Franne Forts: [id number] Page 2 of 6] Edema: [Left: Ye] [Right: s] Calf Left: Right: Point of Measurement: 29 cm From Medial Instep 41 cm Ankle Left: Right: Point of Measurement: 5 cm From Medial Instep 30 cm Vascular Assessment Pulses: Dorsalis Pedis Palpable: [Left:Yes] Electronic Signature(s) Signed: 04/10/2022 5:01:04 PM By: Karie Schwalbe RN Entered By: Karie Schwalbe on 04/10/2022 12:53:35 -------------------------------------------------------------------------------- Multi Wound Chart Details Patient Name: Date of Service: Hunter [name], Hunter RO LD R. 04/10/2022 12:45 PM Medical Record Number: [id number] Patient Account Number: [id number] Date of Birth/Sex: Treating RN: 21-Aug-1950 (72 y.o. 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: Extremity Chronic Obstructive Pulmonary N/A N/A Comorbid History: Disease (COPD), Sleep Apnea, Hypertension, Myocardial Infarction, Type II Diabetes, Neuropathy 01/25/2022 N/A N/A Date Acquired: 1 N/A N/A Weeks of Treatment: Open N/A N/A Wound Status: No N/A N/A Wound Recurrence: 1.5x1.1x0.3 N/A N/A Measurements  L x W x D (cm) 1.296 N/A N/A A (cm) : rea 0.389 N/A N/A Volume (cm) : 47.10% N/A N/A % Reduction in A rea: 47.10% N/A N/A % Reduction in Volume: Grade 2 N/A N/A Classification: Medium N/A N/A Exudate A mount: Serosanguineous N/A N/A Exudate Type: red, brown N/A N/A Exudate Color: Epibole N/A N/A Wound Margin: Medium (34-66%) N/A N/A Granulation A mount: Red, Pink N/A N/A Granulation Quality: Medium (34-66%) N/A N/A Necrotic A mountTULIO, Hunter ([id number]) [id number].pdf Page 3 of 6 Bone: Yes N/A N/A Exposed Structures: None N/A N/A Epithelialization: Debridement - Excisional N/A N/A Debridement: 13:02 N/A N/A Pre-procedure Verification/Time Out Taken: Lidocaine 5% topical ointment N/A N/A Pain Control: Bone, Tendon, Slough N/A N/A Tissue Debrided: Skin/Subcutaneous N/A N/A Level: Tissue/Muscle/Bone 1.65 N/A N/A Debridement A (sq cm): rea Curette N/A N/A Instrument: Minimum N/A N/A Bleeding: Pressure N/A N/A Hemostasis Achieved: 0 N/A N/A Procedural Pain: 0 N/A N/A Post Procedural Pain: Debridement Treatment Response: Procedure was tolerated well N/A N/A Post Debridement Measurements L x 1.5x1.1x0.3 N/A  N/A W x D (cm) 0.389 N/A N/A Post Debridement Volume: (cm) No Abnormalities Noted N/A N/A Periwound Skin Texture: No Abnormalities Noted N/A N/A Periwound Skin Moisture: No Abnormalities Noted N/A N/A Periwound Skin Color: No Abnormality N/A N/A Temperature: Debridement N/A N/A Procedures Performed: Treatment Notes Electronic Signature(s) Signed: 04/10/2022 1:24:27 PM By: Duanne Guess MD FACS Entered By: Duanne Guess on 04/10/2022 13:24:27 -------------------------------------------------------------------------------- Multi-Disciplinary Care Plan Details Patient Name: Date of Service: Hunter [name], Hunter RO LD R. 04/10/2022 12:45 PM Medical Record Number: [id number] Patient Account Number: [id number] Date of Birth/Sex: Treating RN: February 05, 1950 (72 y.o. Hunter [name] Primary Care Reuven Braver: Hunter [name] Other Clinician: Referring Danitra Payano: Treating Shonique Pelphrey/Extender: Marena [name] in Treatment: 1 Active Inactive Wound/Skin Impairment Nursing Diagnoses: Impaired tissue integrity Goals: Patient/caregiver will verbalize understanding of skin care regimen Date Initiated: 04/02/2022 Target Resolution Date: 07/01/2022 Goal Status: Active Interventions: Assess ulceration(s) every visit Treatment Activities: Skin care regimen initiated : 04/02/2022 Notes: Electronic Signature(s) Signed: 04/10/2022 5:01:04 PM By: Karie Schwalbe RN Entered By: Karie Schwalbe on 04/10/2022 16:28:36 -------------------------------------------------------------------------------- Pain Assessment Details Patient Name: Date of Service: [name], Hunter RO LD R. 04/10/2022 12:45 PM Medical Record Number: [id number] Patient Account Number: [id number] ESTILL, LLERENA ([id number]) [id number] Page 4 of 6 Date of Birth/Sex: Treating RN: 1950-06-07 (72 y.o. Hunter [name] Primary Care Benjy Kana: Hunter [name] Other Clinician: Referring  Dimitria Ketchum: Treating Seniyah Esker/Extender: Marena [name] in Treatment: 1 Active Problems Location of Pain Severity and Description of Pain Patient Has Paino No Site Locations Pain Management and Medication Current Pain Management: Electronic Signature(s) Signed: 04/10/2022 5:01:04 PM By: Karie Schwalbe RN Entered By: Karie Schwalbe on 04/10/2022 12:52:32 -------------------------------------------------------------------------------- Patient/Caregiver Education Details Patient Name: Date of Service: Hunter [name] 4/9/2024andnbsp12:45 PM Medical Record Number: [id number] Patient Account Number: [id number] Date of Birth/Gender: Treating RN: 1950-08-01 (72 y.o. Hunter [name] Primary Care Physician: Hunter [name] Other Clinician: Referring Physician: Treating Physician/Extender: Marena [name] in Treatment: 1 Education Assessment Education Provided To: Patient Education Topics Provided Wound/Skin Impairment: Methods: Explain/Verbal Responses: Return demonstration correctly Electronic Signature(s) Signed: 04/10/2022 5:01:04 PM By: Karie Schwalbe RN Entered By: Karie Schwalbe on 04/10/2022 16:28:49 -------------------------------------------------------------------------------- Wound Assessment Details Patient Name: Date of Service: Hunter [name], Hunter RO LD R. 04/10/2022 12:45 PM Medical Record Number: [id number] Patient Account Number: [id number] Date of Birth/Sex: Treating RN: [birth year] (72 y.o. Judie Petit) Karie Schwalbe  Primary Care Janean Eischen: Hunter [name] Other Clinician: [name] ([id number]) [id number] Page 5 of 6 Referring Haylynn Pha: Treating Fabiha Rougeau/Extender: Marena [name] in Treatment: 1 Wound Status Wound Number: 1 Primary Diabetic Wound/Ulcer of the Lower Extremity Etiology: Wound Location: Left T Second oe Wound Open Wounding Event: Gradually  Appeared Status: Date Acquired: 01/25/2022 Comorbid Chronic Obstructive Pulmonary Disease (COPD), Sleep Apnea, Weeks Of Treatment: 1 History: Hypertension, Myocardial Infarction, Type II Diabetes, Neuropathy Clustered Wound: No Photos Wound Measurements Length: (cm) 1.5 Width: (cm) 1.1 Depth: (cm) 0.3 Area: (cm) 1.296 Volume: (cm) 0.389 % Reduction in Area: 47.1% % Reduction in Volume: 47.1% Epithelialization: None Tunneling: No Undermining: No Wound Description Classification: Grade 2 Wound Margin: Epibole Exudate Amount: Medium Exudate Type: Serosanguineous Exudate Color: red, brown Foul Odor After Cleansing: No Slough/Fibrino Yes Wound Bed Granulation Amount: Medium (34-66%) Exposed Structure Granulation Quality: Red, Pink Bone Exposed: Yes Necrotic Amount: Medium (34-66%) Necrotic Quality: Adherent Slough Periwound Skin Texture Texture Color No Abnormalities Noted: Yes No Abnormalities Noted: Yes Moisture Temperature / Pain No Abnormalities Noted: Yes Temperature: No Abnormality Treatment Notes Wound #1 (Toe Second) Wound Laterality: Left Cleanser Soap and Water Discharge Instruction: May shower and wash wound with dial antibacterial soap and water prior to dressing change. Wound Cleanser Discharge Instruction: Cleanse the wound with wound cleanser prior to applying a clean dressing using gauze sponges, not tissue or cotton balls. Peri-Wound Care Topical Primary Dressing Maxorb Extra Calcium Alginate, 2x2 (in/in) Discharge Instruction: Apply to wound bed as instructed Secondary Dressing Woven Gauze Sponges 2x2 in Discharge Instruction: Apply over primary dressing as directed. ZAKHI, DUPRE ([id number]) [id number] Page 6 of 6 Secured With Conforming Stretch Gauze Bandage, Sterile 2x75 (in/in) Discharge Instruction: Secure with stretch gauze as directed. Paper Tape, 2x10 (in/yd) Discharge Instruction: Secure dressing with  tape as directed. Netting, Tubular #2 Discharge Instruction: apply over dressing Compression Wrap Compression Stockings Add-Ons Electronic Signature(s) Signed: 04/10/2022 5:01:04 PM By: Karie Schwalbe RN Signed: 04/19/2022 1:56:54 PM By: Brenton Grills Entered By: Brenton Grills on 04/10/2022 12:57:56 -------------------------------------------------------------------------------- Vitals Details Patient Name: Date of Service: Hunter [name], Hunter RO LD R. 04/10/2022 12:45 PM Medical Record Number: [id number] Patient Account Number: [id number] Date of Birth/Sex: Treating RN: [birth year] (72 y.o. Hunter [name] Primary Care Gill Delrossi: Hunter [name] Other Clinician: Referring Kortlynn Poust: Treating Abbye Lao/Extender: Marena [name] in Treatment: 1 Vital Signs Time Taken: 12:46 Temperature (F): 97.7 Height (in): 66 Pulse (bpm): 71 Weight (lbs): 235 Respiratory Rate (breaths/min): 18 Body Mass Index (BMI): 37.9 Blood Pressure (mmHg): 131/61 Reference Range: 80 - 120 mg / dl Electronic Signature(s) Signed: 04/10/2022 5:01:04 PM By: Karie Schwalbe RN Entered By: Karie Schwalbe on 04/10/2022 12:52:25

## 2022-04-20 ENCOUNTER — Encounter (HOSPITAL_BASED_OUTPATIENT_CLINIC_OR_DEPARTMENT_OTHER): Payer: Medicare Other | Admitting: General Surgery

## 2022-04-20 DIAGNOSIS — E1151 Type 2 diabetes mellitus with diabetic peripheral angiopathy without gangrene: Secondary | ICD-10-CM | POA: Diagnosis not present

## 2022-04-20 NOTE — Progress Notes (Signed)
[name], Hunter ([id number]) [id number] Page 1 of 8 Visit Report for 04/20/2022 Chief Complaint Document Details Patient Name: Date of Service: [name] RO LD R. 04/20/2022 1:15 PM Medical Record Number: [id number] Patient Account Number: [id number] Date of Birth/Sex: Treating RN: Nov 27, 1950 (72 y.o. M) Primary Care Provider: Jarome Matin Other Clinician: Referring Provider: Treating Provider/Extender: Marena Chancy in Treatment: 2 Information Obtained from: Patient Chief Complaint Patients presents for treatment of an open diabetic ulcer Electronic Signature(s) Signed: 04/20/2022 1:49:26 PM By: Duanne Guess MD FACS Entered By: Duanne Guess on 04/20/2022 13:49:26 -------------------------------------------------------------------------------- Debridement Details Patient Name: Date of Service: [name], [name] 04/20/2022 1:15 PM Medical Record Number: [id number] Patient Account Number: [id number] Date of Birth/Sex: Treating RN: Apr 24, 1950 (72 y.o. M) Primary Care Provider: Jarome Matin Other Clinician: Referring Provider: Treating Provider/Extender: Marena Chancy in Treatment: 2 Debridement Performed for Assessment: Wound #1 Left T Second oe Performed By: Physician Duanne Guess, MD Debridement Type: Debridement Severity of Tissue Pre Debridement: Fat layer exposed Level of Consciousness (Pre-procedure): Awake and Alert Pre-procedure Verification/Time Out Yes - 13:25 Taken: Start Time: 13:25 Pain Control: Lidocaine 5% topical ointment T Area Debrided (L x W): otal 0.9 (cm) x 0.9 (cm) = 0.81 (cm) Tissue and other material debrided: Non-Viable, Slough, Subcutaneous, Slough Level: Skin/Subcutaneous Tissue Debridement Description: Excisional Instrument: Curette Bleeding: Minimum Hemostasis Achieved: Pressure End Time: 13:26 Procedural Pain: 0 Post Procedural Pain:  0 Response to Treatment: Procedure was tolerated well Level of Consciousness (Post- Awake and Alert procedure): Post Debridement Measurements of Total Wound Length: (cm) 0.9 Width: (cm) 0.9 Depth: (cm) 0.3 Volume: (cm) 0.191 Character of Wound/Ulcer Post Debridement: Improved Severity of Tissue Post Debridement: Fat layer exposed Post Procedure Diagnosis Same as Pre-procedure Notes Scribed for Dr. Lady Gary by J.Scotton Electronic Signature(s) TEMESGEN, WEIGHTMAN R ([id number]) [id number] Page 2 of 8 Signed: 04/20/2022 1:54:05 PM By: Duanne Guess MD FACS Entered By: Duanne Guess on 04/20/2022 13:54:05 -------------------------------------------------------------------------------- HPI Details Patient Name: Date of Service: [name], [name] 04/20/2022 1:15 PM Medical Record Number: [id number] Patient Account Number: [id number] Date of Birth/Sex: Treating RN: 1950-10-10 (72 y.o. M) Primary Care Provider: Jarome Matin Other Clinician: Referring Provider: Treating Provider/Extender: Marena Chancy in Treatment: 2 History of Present Illness HPI Description: ADMISSION 04/02/2022 This is a 72 year old man with multiple comorbidities including congestive heart failure, chronic ischemic heart disease, COPD, oxygen dependence, diabetes (last hemoglobin A1c 7.2%), morbid obesity, peripheral artery disease, chronic venous insufficiency, and peripheral neuropathy, among others. He routinely follows with podiatry for his diabetic footcare. In February, he was being seen for his regular nail debridement and was noted to have an ulceration on the second digit of his left foot. He had diminished pedal pulses and he was referred for ABI testing. ABIs were noncompressible bilaterally and he had abnormal diminished TBI's of 0. 4 7 on the right and 0.33 on the left. Based upon these findings, along with a nonhealing ulcer, vascular  surgery was consulted. On March 13, 2022, he had an aortogram with left lower extremity runoff. Stents were placed in his left superficial femoral-popliteal artery, left external iliac, and left common iliac artery. Post stenting, he had three-vessel runoff. He saw Dr. Allena Katz after undergoing the stent procedure. As he already had a wound care consultation scheduled, Dr. Allena Katz simply had him continue dressing changes until his visit today. On the dorsal surface of his left second toe, there is an  ulcer with slough and nonviable tendon visible. On further inspection, the joint space of the bone and the end of the proximal phalanx are both visible. 04/10/2022: The biopsy that I took last week showed chronic inflammation, but no osteomyelitis. The wound is cleaner this week with granulation tissue beginning to fill in. There is still a little bit of tendon exposed. 04/20/2022: There is more granulation tissue filling in the wound. Bone remains exposed with some slough accumulation. He saw vascular surgery earlier this week and has improved toe pressures although there is some evidence of stenosis in both his common and external iliac stents as well as stenosis proximal to the stent. They plan to bring him back for short-term interval follow-up to monitor this closely and intervene if necessary. Electronic Signature(s) Signed: 04/20/2022 1:51:58 PM By: Duanne Guess MD FACS Entered By: Duanne Guess on 04/20/2022 13:51:58 -------------------------------------------------------------------------------- Physical Exam Details Patient Name: Date of Service: [name], [name] 04/20/2022 1:15 PM Medical Record Number: [id number] Patient Account Number: [id number] Date of Birth/Sex: Treating RN: [birth year] (72 y.o. M) Primary Care Provider: Jarome Matin Other Clinician: Referring Provider: Treating Provider/Extender: Marena Chancy in Treatment:  2 Constitutional Hypertensive, asymptomatic. . . . no acute distress. Respiratory Normal work of breathing on room air. Notes 04/20/2022: There is more granulation tissue filling in the wound. Bone remains exposed with some slough accumulation Electronic Signature(s) Signed: 04/20/2022 1:53:12 PM By: Duanne Guess MD FACS Entered By: Duanne Guess on 04/20/2022 13:53:12 -------------------------------------------------------------------------------- Physician Orders Details Patient Name: Date of Service: [name], [name] 04/20/2022 1:15 PM Medical Record Number: [id number] Patient Account Number: [id number] Date of Birth/Sex: Treating RN: [birth year] (71 y.o. Nahuel, Wilbert, Ralston R ([id number]) [phone].pdf Page 3 of 8 Primary Care Provider: Jarome Matin Other Clinician: Referring Provider: Treating Provider/Extender: Marena Chancy in Treatment: 2 Verbal / Phone Orders: No Diagnosis Coding ICD-10 Coding Code Description [phone] Non-pressure chronic ulcer of other part of left foot with bone involvement without evidence of necrosis E11.621 Type 2 diabetes mellitus with foot ulcer I73.9 Peripheral vascular disease, unspecified I25.9 Chronic ischemic heart disease, unspecified I87.2 Venous insufficiency (chronic) (peripheral) I50.30 Unspecified diastolic (congestive) heart failure I27.20 Pulmonary hypertension, unspecified J44.9 Chronic obstructive pulmonary disease, unspecified Z99.81 Dependence on supplemental oxygen Follow-up Appointments ppointment in 1 week. - Dr. Lady Gary Room 3 Return A Anesthetic (In clinic) Topical Lidocaine 4% applied to wound bed - Used in Clinic prior to debridement Cellular or Tissue Based Products Wound #1 Left T Second oe Cellular or Tissue Based Product Type: - RUN IVR for Epicord Bathing/ Shower/ Hygiene May shower and wash wound with soap and water. - May change  dressing after bathing Edema Control - Lymphedema / SCD / Other Bilateral Lower Extremities Avoid standing for long periods of time. Patient to wear own compression stockings every day. Other Edema Control Orders/Instructions: - Elevate feet throughout the day Off-Loading Other: - Place pillows under ankles to off-load-especially while in bed Wound Treatment Wound #1 - T Second oe Wound Laterality: Left Cleanser: Soap and Water 1 x Per Day/30 Days Discharge Instructions: May shower and wash wound with dial antibacterial soap and water prior to dressing change. Cleanser: Wound Cleanser (Generic) 1 x Per Day/30 Days Discharge Instructions: Cleanse the wound with wound cleanser prior to applying a clean dressing using gauze sponges, not tissue or cotton balls. Prim Dressing: Maxorb Extra Calcium Alginate, 2x2 (in/in) (Generic) 1 x Per Day/30 Days ary  Discharge Instructions: Apply to wound bed as instructed Secondary Dressing: Woven Gauze Sponges 2x2 in (Generic) 1 x Per Day/30 Days Discharge Instructions: Apply over primary dressing as directed. Secured With: Insurance underwriter, Sterile 2x75 (in/in) (Generic) 1 x Per Day/30 Days Discharge Instructions: Secure with stretch gauze as directed. Secured With: Paper Tape, 2x10 (in/yd) (Generic) 1 x Per Day/30 Days Discharge Instructions: Secure dressing with tape as directed. Secured With: Netting, Tubular #2 (Generic) 1 x Per Day/30 Days Discharge Instructions: apply over dressing Electronic Signature(s) Signed: 04/20/2022 1:59:44 PM By: Duanne Guess MD FACS Entered By: Duanne Guess on 04/20/2022 13:53:29 CULVER, FEIGHNER ([id number]) [id number] Page 4 of 8 -------------------------------------------------------------------------------- Problem List Details Patient Name: Date of Service: [name] RO LD R. 04/20/2022 1:15 PM Medical Record Number: [id number] Patient Account Number:  [id number] Date of Birth/Sex: Treating RN: 04/08/1950 (72 y.o. M) Primary Care Provider: Jarome Matin Other Clinician: Referring Provider: Treating Provider/Extender: Marena Chancy in Treatment: 2 Active Problems ICD-10 Encounter Code Description Active Date MDM Diagnosis L97.526 Non-pressure chronic ulcer of other part of left foot with bone involvement 04/02/2022 No Yes without evidence of necrosis E11.621 Type 2 diabetes mellitus with foot ulcer 04/02/2022 No Yes I73.9 Peripheral vascular disease, unspecified 04/02/2022 No Yes I25.9 Chronic ischemic heart disease, unspecified 04/02/2022 No Yes I87.2 Venous insufficiency (chronic) (peripheral) 04/02/2022 No Yes I50.30 Unspecified diastolic (congestive) heart failure 04/02/2022 No Yes I27.20 Pulmonary hypertension, unspecified 04/02/2022 No Yes J44.9 Chronic obstructive pulmonary disease, unspecified 04/02/2022 No Yes Z99.81 Dependence on supplemental oxygen 04/02/2022 No Yes Inactive Problems Resolved Problems Electronic Signature(s) Signed: 04/20/2022 1:47:51 PM By: Duanne Guess MD FACS Entered By: Duanne Guess on 04/20/2022 13:47:51 -------------------------------------------------------------------------------- Progress Note Details Patient Name: Date of Service: [name], [name] 04/20/2022 1:15 PM Medical Record Number: [id number] Patient Account Number: [id number] Date of Birth/Sex: Treating RN: 1950-09-16 (72 y.o. M) Primary Care Provider: Jarome Matin Other Clinician: Referring Provider: Treating Provider/Extender: Marena Chancy in Treatment: 2 Subjective JEFFREE, CAZEAU ([id number]) [id number] Page 5 of 8 Chief Complaint Information obtained from Patient Patients presents for treatment of an open diabetic ulcer History of Present Illness (HPI) ADMISSION 04/02/2022 This is a 72 year old man with multiple comorbidities including  congestive heart failure, chronic ischemic heart disease, COPD, oxygen dependence, diabetes (last hemoglobin A1c 7.2%), morbid obesity, peripheral artery disease, chronic venous insufficiency, and peripheral neuropathy, among others. He routinely follows with podiatry for his diabetic footcare. In February, he was being seen for his regular nail debridement and was noted to have an ulceration on the second digit of his left foot. He had diminished pedal pulses and he was referred for ABI testing. ABIs were noncompressible bilaterally and he had abnormal diminished TBI's of 0. 4 7 on the right and 0.33 on the left. Based upon these findings, along with a nonhealing ulcer, vascular surgery was consulted. On March 13, 2022, he had an aortogram with left lower extremity runoff. Stents were placed in his left superficial femoral-popliteal artery, left external iliac, and left common iliac artery. Post stenting, he had three-vessel runoff. He saw Dr. Allena Katz after undergoing the stent procedure. As he already had a wound care consultation scheduled, Dr. Allena Katz simply had him continue dressing changes until his visit today. On the dorsal surface of his left second toe, there is an ulcer with slough and nonviable tendon visible. On further inspection, the joint space of the bone and the end of the proximal phalanx  are both visible. 04/10/2022: The biopsy that I took last week showed chronic inflammation, but no osteomyelitis. The wound is cleaner this week with granulation tissue beginning to fill in. There is still a little bit of tendon exposed. 04/20/2022: There is more granulation tissue filling in the wound. Bone remains exposed with some slough accumulation. He saw vascular surgery earlier this week and has improved toe pressures although there is some evidence of stenosis in both his common and external iliac stents as well as stenosis proximal to the stent. They plan to bring him back for short-term interval  follow-up to monitor this closely and intervene if necessary. Patient History Social History Former smoker - ended on 01/01/2009, Marital Status - Married, Alcohol Use - Never, Drug Use - No History, Caffeine Use - Never. Medical History Respiratory Patient has history of Chronic Obstructive Pulmonary Disease (COPD), Sleep Apnea - wears a CPAP Cardiovascular Patient has history of Hypertension, Myocardial Infarction Endocrine Patient has history of Type II Diabetes Neurologic Patient has history of Neuropathy Hospitalization/Surgery History - CABG (2005);[name] Surgical History Notes nd Cardiovascular Carotid Artery Disease Chronic Venous Insufficiency Peripheral arterial occlusive disease Gastrointestinal GERD Genitourinary ED (of organic origin) Urinary Incontinence Musculoskeletal Left Foot - Foot Drop Neurologic Left Foot Drop Objective Constitutional Hypertensive, asymptomatic. no acute distress. Vitals Time Taken: 1:03 PM, Height: 66 in, Weight: 235 lbs, BMI: 37.9, Temperature: 97.6 F, Pulse: 73 bpm, Respiratory Rate: 20 breaths/min, Blood Pressure: 158/85 mmHg. Respiratory Normal work of breathing on room air. General Notes: 04/20/2022: There is more granulation tissue filling in the wound. Bone remains exposed with some slough accumulation Integumentary (Hair, Skin) Wound #1 status is Open. Original cause of wound was Gradually Appeared. The date acquired was: 01/25/2022. The wound has been in treatment 2 weeks. The wound is located on the Left T Second. The wound measures 0.9cm length x 0.9cm width x 0.3cm depth; 0.636cm^2 area and 0.191cm^3 volume. There is oe bone and Fat Layer (Subcutaneous Tissue) exposed. There is no tunneling or undermining noted. There is a medium amount of serosanguineous drainage noted. The wound margin is epibole. There is medium (34-66%) red, pink granulation within the wound bed. There is a medium (34-66%) amount of necrotic tissue  within the wound bed including Adherent Slough. The periwound skin appearance had no abnormalities noted for texture. The periwound skin appearance had no abnormalities noted for moisture. The periwound skin appearance had no abnormalities noted for color. Periwound temperature was noted as No Abnormality. TREVAR, BOEHRINGER ([id number]) [id number] Page 6 of 8 Assessment Active Problems ICD-10 Non-pressure chronic ulcer of other part of left foot with bone involvement without evidence of necrosis Type 2 diabetes mellitus with foot ulcer Peripheral vascular disease, unspecified Chronic ischemic heart disease, unspecified Venous insufficiency (chronic) (peripheral) Unspecified diastolic (congestive) heart failure Pulmonary hypertension, unspecified Chronic obstructive pulmonary disease, unspecified Dependence on supplemental oxygen Procedures Wound #1 Pre-procedure diagnosis of Wound #1 is a Diabetic Wound/Ulcer of the Lower Extremity located on the Left T Second .Severity of Tissue Pre Debridement oe is: Fat layer exposed. There was a Excisional Skin/Subcutaneous Tissue Debridement with a total area of 0.81 sq cm performed by Duanne Guess, MD. With the following instrument(s): Curette to remove Non-Viable tissue/material. Material removed includes Subcutaneous Tissue and Slough and after achieving pain control using Lidocaine 5% topical ointment. No specimens were taken. A time out was conducted at 13:25, prior to the start of the procedure. A Minimum amount of bleeding was controlled with Pressure. The  procedure was tolerated well with a pain level of 0 throughout and a pain level of 0 following the procedure. Post Debridement Measurements: 0.9cm length x 0.9cm width x 0.3cm depth; 0.191cm^3 volume. Character of Wound/Ulcer Post Debridement is improved. Severity of Tissue Post Debridement is: Fat layer exposed. Post procedure Diagnosis Wound #1: Same as  Pre-Procedure General Notes: Scribed for Dr. Lady Gary by J.Scotton. Plan Follow-up Appointments: Return Appointment in 1 week. - Dr. Lady Gary Room 3 Anesthetic: (In clinic) Topical Lidocaine 4% applied to wound bed - Used in Clinic prior to debridement Cellular or Tissue Based Products: Wound #1 Left T Second: oe Cellular or Tissue Based Product Type: - RUN IVR for Epicord Bathing/ Shower/ Hygiene: May shower and wash wound with soap and water. - May change dressing after bathing Edema Control - Lymphedema / SCD / Other: Avoid standing for long periods of time. Patient to wear own compression stockings every day. Other Edema Control Orders/Instructions: - Elevate feet throughout the day Off-Loading: Other: - Place pillows under ankles to off-load-especially while in bed WOUND #1: - T Second Wound Laterality: Left oe Cleanser: Soap and Water 1 x Per Day/30 Days Discharge Instructions: May shower and wash wound with dial antibacterial soap and water prior to dressing change. Cleanser: Wound Cleanser (Generic) 1 x Per Day/30 Days Discharge Instructions: Cleanse the wound with wound cleanser prior to applying a clean dressing using gauze sponges, not tissue or cotton balls. Prim Dressing: Maxorb Extra Calcium Alginate, 2x2 (in/in) (Generic) 1 x Per Day/30 Days ary Discharge Instructions: Apply to wound bed as instructed Secondary Dressing: Woven Gauze Sponges 2x2 in (Generic) 1 x Per Day/30 Days Discharge Instructions: Apply over primary dressing as directed. Secured With: Insurance underwriter, Sterile 2x75 (in/in) (Generic) 1 x Per Day/30 Days Discharge Instructions: Secure with stretch gauze as directed. Secured With: Paper T ape, 2x10 (in/yd) (Generic) 1 x Per Day/30 Days Discharge Instructions: Secure dressing with tape as directed. Secured With: Netting, Tubular #2 (Generic) 1 x Per Day/30 Days Discharge Instructions: apply over dressing 04/20/2022: There is more  granulation tissue filling in the wound. Bone remains exposed with some slough accumulation. I used a curette to debride slough and subcutaneous tissue from the wound. We will continue silver alginate. He will likely benefit from skin substitute application. Although it is still early in his treatment, we are going to go ahead and run his insurance for Epicord, as I would like to begin using this as soon as possible. Follow-up in 1 week. Electronic Signature(s) Signed: 04/20/2022 1:54:58 PM By: Duanne Guess MD FACS [name] ([id number]) [id number] Page 7 of 8 Entered By: Duanne Guess on 04/20/2022 13:54:57 -------------------------------------------------------------------------------- HxROS Details Patient Name: Date of Service: [name] RO LD R. 04/20/2022 1:15 PM Medical Record Number: [id number] Patient Account Number: [id number] Date of Birth/Sex: Treating RN: May 15, 1950 (72 y.o. M) Primary Care Provider: Jarome Matin Other Clinician: Referring Provider: Treating Provider/Extender: Marena Chancy in Treatment: 2 Respiratory Medical History: Positive for: Chronic Obstructive Pulmonary Disease (COPD); Sleep Apnea - wears a CPAP Cardiovascular Medical History: Positive for: Hypertension; Myocardial Infarction Past Medical History Notes: Carotid Artery Disease Chronic Venous Insufficiency Peripheral arterial occlusive disease Gastrointestinal Medical History: Past Medical History Notes: GERD Endocrine Medical History: Positive for: Type II Diabetes Time with diabetes: 15 years Treated with: Insulin Blood sugar tested every day: Yes Tested : 2 x day Genitourinary Medical History: Past Medical History Notes: ED (of organic origin) Urinary Incontinence Musculoskeletal Medical  History: Past Medical History Notes: Left Foot - Foot Drop Neurologic Medical History: Positive for:  Neuropathy Past Medical History Notes: Left Foot Drop Immunizations Pneumococcal Vaccine: Received Pneumococcal Vaccination: No Implantable Devices None Hospitalization / Surgery History Type of Hospitalization/Surgery CABG (2005); Family and Social History Former smoker - ended on 01/01/2009; Marital Status - Married; Alcohol Use: Never; Drug Use: No History; Caffeine Use: Never; Financial Concerns: No; Food, Clothing or Shelter Needs: No; Support System Lacking: No; Transportation Concerns: No TIMITHY, ARONS ([id number]) [id number] Page 8 of 8 Electronic Signature(s) Signed: 04/20/2022 1:59:44 PM By: Duanne Guess MD FACS Entered By: Duanne Guess on 04/20/2022 13:52:38 -------------------------------------------------------------------------------- SuperBill Details Patient Name: Date of Service: [name], [name] 04/20/2022 Medical Record Number: [id number] Patient Account Number: [id number] Date of Birth/Sex: Treating RN: March 05, 1950 (72 y.o. M) Primary Care Provider: Jarome Matin Other Clinician: Referring Provider: Treating Provider/Extender: Marena Chancy in Treatment: 2 Diagnosis Coding ICD-10 Codes Code Description [phone] Non-pressure chronic ulcer of other part of left foot with bone involvement without evidence of necrosis E11.621 Type 2 diabetes mellitus with foot ulcer I73.9 Peripheral vascular disease, unspecified I25.9 Chronic ischemic heart disease, unspecified I87.2 Venous insufficiency (chronic) (peripheral) I50.30 Unspecified diastolic (congestive) heart failure I27.20 Pulmonary hypertension, unspecified J44.9 Chronic obstructive pulmonary disease, unspecified Z99.81 Dependence on supplemental oxygen Facility Procedures : CPT4 Code: [id number] Description: 11042 - DEB SUBQ TISSUE 20 SQ CM/< ICD-10 Diagnosis Description L97.526 Non-pressure chronic ulcer of other part of left foot with  bone involvement wit Modifier: hout evidence of necr Quantity: 1 osis Physician Procedures : CPT4 Code Description Modifier [id number] - WC PHYS LEVEL 3 - EST PT 25 ICD-10 Diagnosis Description L97.526 Non-pressure chronic ulcer of other part of left foot with bone involvement without evidence of necr E11.621 Type 2 diabetes mellitus with  foot ulcer I73.9 Peripheral vascular disease, unspecified I50.30 Unspecified diastolic (congestive) heart failure Quantity: 1 osis : [id number] 11042 - WC PHYS SUBQ TISS 20 SQ CM ICD-10 Diagnosis Description L97.526 Non-pressure chronic ulcer of other part of left foot with bone involvement without evidence of necr Quantity: 1 osis Electronic Signature(s) Signed: 04/20/2022 1:56:08 PM By: Duanne Guess MD FACS Entered By: Duanne Guess on 04/20/2022 13:56:08

## 2022-04-21 NOTE — Progress Notes (Signed)
HO, [name] [id number] Page 1 of 6 Visit Report for 04/20/2022 Arrival Information Details Patient Name: Date of Service: [name] RO LD R. 04/20/2022 1:15 PM Medical Record Number: [id number] Patient Account Number: [id number] Date of Birth/Sex: Treating RN: 1950/12/07 (72 y.o. Hunter [name] Primary Care Hunter [name]: Hunter [name] Other Clinician: Referring Hunter [name]: Treating Hunter [name]: Marena Chancy in Treatment: 2 Visit Information History Since Last Visit Added or deleted any medications: No Patient Arrived: Wheel Chair Any new allergies or adverse reactions: No Arrival Time: 13:03 Had a fall or experienced change in No Accompanied By: spouse activities of daily living that may affect Transfer Assistance: None risk of falls: Patient Identification Verified: Yes Signs or symptoms of abuse/neglect since last visito No Patient Has Alerts: Yes Hospitalized since last visit: No Patient Alerts: ABI R [place] (03/02/22) Implantable device outside of the clinic excluding No ABI L  (03/02/22) cellular tissue based products placed in the center Diuretic-Lasix since last visit: Plavix Has Dressing in Place as Prescribed: Yes Pain Present Now: Yes Electronic Signature(s) Signed: 04/20/2022 5:08:39 PM By: Karie Schwalbe RN Entered By: Karie Schwalbe on 04/20/2022 13:03:31 -------------------------------------------------------------------------------- Encounter Discharge Information Details Patient Name: Date of Service: [name], [name] 04/20/2022 1:15 PM Medical Record Number: [id number] Patient Account Number: [id number] Date of Birth/Sex: Treating RN: 1950-05-12 (72 y.o. Hunter [name] Primary Care Nela Bascom: Hunter [name] Other Clinician: Referring Kaiya Boatman: Treating Hunter [name]: Marena Chancy in Treatment: 2 Encounter Discharge Information Items  Post Procedure Vitals Discharge Condition: Stable Temperature (F): 97.6 Ambulatory Status: Wheelchair Pulse (bpm): 73 Discharge Destination: Home Respiratory Rate (breaths/min): 20 Transportation: Private Auto Blood Pressure (mmHg): 158/85 Accompanied By: spouse Schedule Follow-up Appointment: Yes Clinical Summary of Care: Patient Declined Electronic Signature(s) Signed: 04/20/2022 5:08:39 PM By: Karie Schwalbe RN Entered By: Karie Schwalbe on 04/20/2022 16:56:12 -------------------------------------------------------------------------------- Lower Extremity Assessment Details Patient Name: Date of Service: [name] RO LD R. 04/20/2022 1:15 PM Medical Record Number: [id number] Patient Account Number: [id number] Date of Birth/Sex: Treating RN: 04-27-1950 (72 y.o. Hunter [name] Primary Care Ashly Goethe: Hunter [name] Other Clinician: Referring Clayborne Divis: Treating Jermell Holeman/Extender: Marena Chancy in Treatment: 2 Edema Assessment Assessed: Hunter [name]: No] [Right: No] H[LeftKYREE, [name] ([id number])] [Right: [id number] Page 2 of 6] Edema: [Left: Ye] [Right: s] Calf Left: Right: Point of Measurement: 29 cm From Medial Instep 41 cm Ankle Left: Right: Point of Measurement: 5 cm From Medial Instep 30 cm Vascular Assessment Pulses: Dorsalis Pedis Palpable: [Left:Yes] Electronic Signature(s) Signed: 04/20/2022 5:08:39 PM By: Karie Schwalbe RN Entered By: Karie Schwalbe on 04/20/2022 13:09:10 -------------------------------------------------------------------------------- Multi Wound Chart Details Patient Name: Date of Service: [name], [name] 04/20/2022 1:15 PM Medical Record Number: [id number] Patient Account Number: [id number] Date of Birth/Sex: Treating RN: 02-Jun-1950 (72 y.o. M) Primary Care Hunter [name]: Hunter [name] Other Clinician: Referring Hunter [name]: Treating Hunter [name]: Marena Chancy in Treatment: 2 Vital Signs Height(in): 66 Pulse(bpm): 73 Weight(lbs): 235 Blood Pressure(mmHg): 158/85 Body Mass Index(BMI): 37.9 Temperature(F): 97.6 Respiratory Rate(breaths/min): 20 [1:Photos:] [N/A:N/A] Left T Second oe N/A N/A Wound Location: Gradually Appeared N/A N/A Wounding Event: Diabetic Wound/Ulcer of the Lower N/A N/A Primary Etiology: Extremity Chronic Obstructive Pulmonary N/A N/A Comorbid History: Disease (COPD), Sleep Apnea, Hypertension, Myocardial Infarction, Type II Diabetes, Neuropathy 01/25/2022 N/A N/A Date Acquired: 2 N/A N/A Weeks of Treatment: Open N/A N/A Wound Status: No N/A N/A Wound Recurrence: 0.9x0.9x0.3 N/A N/A Measurements L  x W x D (cm) 0.636 N/A N/A A (cm) : rea 0.191 N/A N/A Volume (cm) : 74.00% N/A N/A % Reduction in A rea: 74.00% N/A N/A % Reduction in Volume: Grade 2 N/A N/A Classification: Medium N/A N/A Exudate A mount: Serosanguineous N/A N/A Exudate Type: red, brown N/A N/A Exudate Color: Epibole N/A N/A Wound Margin: Medium (34-66%) N/A N/A Granulation A mount: Red, Pink N/A N/A Granulation Quality: Medium (34-66%) N/A N/A Necrotic A mountESTEVEN, [name] ([id number]) [id number] Page 3 of 6 Fat Layer (Subcutaneous Tissue): Yes N/A N/A Exposed Structures: Bone: Yes Fascia: No Tendon: No Muscle: No Joint: No None N/A N/A Epithelialization: Debridement - Selective/Open Wound N/A N/A Debridement: Pre-procedure Verification/Time Out 13:25 N/A N/A Taken: Lidocaine 5% topical ointment N/A N/A Pain Control: Slough N/A N/A Tissue Debrided: Non-Viable Tissue N/A N/A Level: 0.81 N/A N/A Debridement A (sq cm): rea Curette N/A N/A Instrument: Minimum N/A N/A Bleeding: Pressure N/A N/A Hemostasis A chieved: 0 N/A N/A Procedural Pain: 0 N/A N/A Post Procedural Pain: Procedure was tolerated well N/A N/A Debridement Treatment  Response: 0.9x0.9x0.3 N/A N/A Post Debridement Measurements L x W x D (cm) 0.191 N/A N/A Post Debridement Volume: (cm) No Abnormalities Noted N/A N/A Periwound Skin Texture: No Abnormalities Noted N/A N/A Periwound Skin Moisture: No Abnormalities Noted N/A N/A Periwound Skin Color: No Abnormality N/A N/A Temperature: Debridement N/A N/A Procedures Performed: Treatment Notes Electronic Signature(s) Signed: 04/20/2022 1:47:58 PM By: Duanne Guess MD FACS Entered By: Duanne Guess on 04/20/2022 13:47:58 -------------------------------------------------------------------------------- Multi-Disciplinary Care Plan Details Patient Name: Date of Service: [name], [name] 04/20/2022 1:15 PM Medical Record Number: [id number] Patient Account Number: [id number] Date of Birth/Sex: Treating RN: 1950/11/10 (72 y.o. Hunter [name] Primary Care Tully Mcinturff: Hunter [name] Other Clinician: Referring Elisabel Hanover: Treating Markelle Asaro/Extender: Marena Chancy in Treatment: 2 Active Inactive Wound/Skin Impairment Nursing Diagnoses: Impaired tissue integrity Goals: Patient/caregiver will verbalize understanding of skin care regimen Date Initiated: 04/02/2022 Target Resolution Date: 07/01/2022 Goal Status: Active Interventions: Assess ulceration(s) every visit Treatment Activities: Skin care regimen initiated : 04/02/2022 Notes: Electronic Signature(s) Signed: 04/20/2022 5:08:39 PM By: Karie Schwalbe RN Entered By: Karie Schwalbe on 04/20/2022 16:53:42 [name] ([id number]) [id number] Page 4 of 6 -------------------------------------------------------------------------------- Pain Assessment Details Patient Name: Date of Service: [name] RO LD R. 04/20/2022 1:15 PM Medical Record Number: [id number] Patient Account Number: [id number] Date of Birth/Sex: Treating RN: 1950/04/28 (72 y.o. Hunter [name] Primary Care  Lenn Volker: Hunter [name] Other Clinician: Referring Berkleigh Beckles: Treating Sharron Simpson/Extender: Marena Chancy in Treatment: 2 Active Problems Location of Pain Severity and Description of Pain Patient Has Paino Yes Site Locations Pain Location: Generalized Pain With Dressing Change: Yes Duration of the Pain. Constant / Intermittento Constant Rate the pain. Current Pain Level: 4 Worst Pain Level: 10 Least Pain Level: 4 Tolerable Pain Level: 5 Character of Pain Describe the Pain: Difficult to Pinpoint Pain Management and Medication Current Pain Management: Medication: Yes Cold Application: No Rest: Yes Massage: No Activity: No T.E.N.S.: No Heat Application: No Leg drop or elevation: No Is the Current Pain Management Adequate: Adequate How does your wound impact your activities of daily livingo Sleep: No Bathing: No Appetite: No Relationship With Others: No Bladder Continence: No Emotions: No Bowel Continence: No Work: No Toileting: No Drive: No Dressing: No Hobbies: No Electronic Signature(s) Signed: 04/20/2022 5:08:39 PM By: Karie Schwalbe RN Entered By: Karie Schwalbe on 04/20/2022 13:06:12 -------------------------------------------------------------------------------- Patient/Caregiver Education Details Patient Name:  Date of Service: [name] RO LD R. 4/19/2024andnbsp1:15 PM Medical Record Number: [id number] Patient Account Number: [id number] Date of Birth/Gender: Treating RN: 14-Apr-1950 (72 y.o. Hunter [name] Primary Care Physician: Hunter [name] Other Clinician: Referring Physician: Treating Physician/Extender: Marena Chancy in Treatment: 2 Education Assessment Education Provided To: Patient MARRIO, SCRIBNER ([id number]) [id number] Page 5 of 6 Education Topics Provided Wound/Skin Impairment: Methods: Explain/Verbal Responses: Return demonstration  correctly Electronic Signature(s) Signed: 04/20/2022 5:08:39 PM By: Karie Schwalbe RN Entered By: Karie Schwalbe on 04/20/2022 16:54:00 -------------------------------------------------------------------------------- Wound Assessment Details Patient Name: Date of Service: [name], [name] 04/20/2022 1:15 PM Medical Record Number: [id number] Patient Account Number: [id number] Date of Birth/Sex: Treating RN: April 24, 1950 (72 y.o. Hunter [name] Primary Care Karl Knarr: Hunter [name] Other Clinician: Referring Anona Giovannini: Treating Catelin Manthe/Extender: Marena Chancy in Treatment: 2 Wound Status Wound Number: 1 Primary Diabetic Wound/Ulcer of the Lower Extremity Etiology: Wound Location: Left T Second oe Wound Open Wounding Event: Gradually Appeared Status: Date Acquired: 01/25/2022 Comorbid Chronic Obstructive Pulmonary Disease (COPD), Sleep Apnea, Weeks Of Treatment: 2 History: Hypertension, Myocardial Infarction, Type II Diabetes, Neuropathy Clustered Wound: No Photos Wound Measurements Length: (cm) 0.9 Width: (cm) 0.9 Depth: (cm) 0.3 Area: (cm) 0.636 Volume: (cm) 0.191 % Reduction in Area: 74% % Reduction in Volume: 74% Epithelialization: None Tunneling: No Undermining: No Wound Description Classification: Grade 2 Wound Margin: Epibole Exudate Amount: Medium Exudate Type: Serosanguineous Exudate Color: red, brown Foul Odor After Cleansing: No Slough/Fibrino Yes Wound Bed Granulation Amount: Medium (34-66%) Exposed Structure Granulation Quality: Red, Pink Fascia Exposed: No Necrotic Amount: Medium (34-66%) Fat Layer (Subcutaneous Tissue) Exposed: Yes Necrotic Quality: Adherent Slough Tendon Exposed: No Muscle Exposed: No Joint Exposed: No Bone Exposed: Yes Periwound Skin Texture Texture Color No Abnormalities Noted: Yes No Abnormalities Noted: Yes Moisture Temperature / Pain No Abnormalities Noted: Yes Temperature: No  Abnormality WELLES, WALTHALL ([id number]) [id number] Page 6 of 6 Treatment Notes Wound #1 (Toe Second) Wound Laterality: Left Cleanser Soap and Water Discharge Instruction: May shower and wash wound with dial antibacterial soap and water prior to dressing change. Wound Cleanser Discharge Instruction: Cleanse the wound with wound cleanser prior to applying a clean dressing using gauze sponges, not tissue or cotton balls. Peri-Wound Care Topical Primary Dressing Maxorb Extra Calcium Alginate, 2x2 (in/in) Discharge Instruction: Apply to wound bed as instructed Secondary Dressing Woven Gauze Sponges 2x2 in Discharge Instruction: Apply over primary dressing as directed. Secured With Conforming Stretch Gauze Bandage, Sterile 2x75 (in/in) Discharge Instruction: Secure with stretch gauze as directed. Paper Tape, 2x10 (in/yd) Discharge Instruction: Secure dressing with tape as directed. Netting, Tubular #2 Discharge Instruction: apply over dressing Compression Wrap Compression Stockings Add-Ons Electronic Signature(s) Signed: 04/20/2022 2:20:45 PM By: Karl Ito Signed: 04/20/2022 5:08:39 PM By: Karie Schwalbe RN Entered By: Karl Ito on 04/20/2022 13:11:41 -------------------------------------------------------------------------------- Vitals Details Patient Name: Date of Service: [name], [name] 04/20/2022 1:15 PM Medical Record Number: [id number] Patient Account Number: [id number] Date of Birth/Sex: Treating RN: 09/28/1950 (72 y.o. Hunter [name] Primary Care Denisse Whitenack: Hunter [name] Other Clinician: Referring Athanasios Heldman: Treating Kiahna Banghart/Extender: Marena Chancy in Treatment: 2 Vital Signs Time Taken: 13:03 Temperature (F): 97.6 Height (in): 66 Pulse (bpm): 73 Weight (lbs): 235 Respiratory Rate (breaths/min): 20 Body Mass Index (BMI): 37.9 Blood Pressure (mmHg): 158/85 Reference Range: 80 -  120 mg / dl Electronic Signature(s) Signed: 04/20/2022 5:08:39 PM By: Karie Schwalbe RN Entered By: Karie Schwalbe  on 04/20/2022 13:06:32

## 2022-04-24 ENCOUNTER — Other Ambulatory Visit (HOSPITAL_COMMUNITY): Payer: Self-pay

## 2022-04-24 ENCOUNTER — Other Ambulatory Visit: Payer: Self-pay

## 2022-04-24 DIAGNOSIS — I739 Peripheral vascular disease, unspecified: Secondary | ICD-10-CM

## 2022-04-24 DIAGNOSIS — I70245 Atherosclerosis of native arteries of left leg with ulceration of other part of foot: Secondary | ICD-10-CM

## 2022-04-27 ENCOUNTER — Other Ambulatory Visit (HOSPITAL_COMMUNITY): Payer: Self-pay

## 2022-04-27 MED ORDER — FREESTYLE LIBRE 3 SENSOR MISC
3 refills | Status: AC
  Filled 2022-04-27 – 2022-05-31 (×3): qty 6, 84d supply, fill #0
  Filled 2022-08-18: qty 6, 84d supply, fill #1
  Filled 2022-11-02: qty 6, 84d supply, fill #2
  Filled 2023-03-04 – 2023-04-02 (×2): qty 6, 84d supply, fill #3

## 2022-04-30 ENCOUNTER — Encounter (HOSPITAL_BASED_OUTPATIENT_CLINIC_OR_DEPARTMENT_OTHER): Payer: Medicare Other | Admitting: General Surgery

## 2022-04-30 DIAGNOSIS — E1151 Type 2 diabetes mellitus with diabetic peripheral angiopathy without gangrene: Secondary | ICD-10-CM | POA: Diagnosis not present

## 2022-05-01 ENCOUNTER — Ambulatory Visit: Payer: Medicare Other | Admitting: Podiatry

## 2022-05-01 DIAGNOSIS — L97522 Non-pressure chronic ulcer of other part of left foot with fat layer exposed: Secondary | ICD-10-CM

## 2022-05-01 DIAGNOSIS — E1151 Type 2 diabetes mellitus with diabetic peripheral angiopathy without gangrene: Secondary | ICD-10-CM | POA: Diagnosis not present

## 2022-05-01 NOTE — Progress Notes (Signed)
Subjective:  Patient ID: Hunter [name], male    DOB: [birth year],  MRN: [id number]  Chief Complaint  Patient presents with   Wound Check    72 y.o. male presents with the above complaint.  Patient presents with left second digit ulceration with fat layer exposed.  He states is doing better the wound care center has helped considerably.  His bone is no longer exposed.  He is got some granulation tissue.  Denies any other acute complaints.   Review of Systems: Negative except as noted in the HPI. Denies N/V/F/Ch.  Past Medical History:  Diagnosis Date   (HFpEF) heart failure with preserved ejection fraction (HCC)    Echo 5/22: EF 50-55, no RWMA, moderate LVH, GRII DD, normal RVSF, trivial MR   Bilateral lower extremity edema    CAD (coronary artery disease) cardiologist-  dr Anne Fu   a. Remote MI with stent in 2005;  b. CABG x 5 in 2005;  c. 09/2010 Cath: LM nl, LAD 70p, 90-96m, D1 80p, LCX 29m, OM1 100, OM2 90ost, OM3 80, RCA 73m, VG->PDA->RPL nl, VG->Diag nl, VG->OM1 nl, LIMA->LAD nl, EF 60%-->Med Rx.// Myoview 5/22: EF 46, mod inf infarct, no ischemia    Carotid artery disease (HCC)    Chronic venous insufficiency    post vein harvest for CABG   COPD (chronic obstructive pulmonary disease) (HCC)    Diabetes mellitus type 2, controlled (HCC)    Difficult intravenous access 06/16/2015   multiple attempt IV starts until use of scanner   ED (erectile dysfunction) of organic origin    Foot drop, bilateral    GERD (gastroesophageal reflux disease)    H/O spinal cord injury    a. more or less wheelchair bound. 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-28-2003   Type 2 diabetes mellitus (HCC)    Urinary incontinence, urge    Wheelchair bound     Current Outpatient Medications:    albuterol (PROVENTIL) (2.5 MG/3ML) 0.083% nebulizer solution, USE 1 VIAL IN NEBULIZER EVERY 6 HOURS - and as needed DX: J44.9 (Patient taking differently: Take 2.5 mg by nebulization 2 (two) times daily.), Disp: 360 mL, Rfl: 11   albuterol (VENTOLIN HFA) 108 (90 Base) MCG/ACT inhaler, Inhale 2 puffs into the lungs every 4-6 hours as needed., Disp: 6.7 g, Rfl: 5   alum hydroxide-mag trisilicate (GAVISCON) 80-20 MG CHEW chewable tablet, Chew 2 tablets by mouth 3 (three) times daily as needed for indigestion or heartburn., Disp: , Rfl:    amLODipine (NORVASC) 5 MG tablet, TAKE 1 TABLET BY MOUTH  EVERY DAY, Disp: 90 tablet, Rfl: 3   amLODipine (NORVASC) 5 MG tablet, Take 1 tablet (5 mg total) by mouth daily., Disp: 90 tablet, Rfl: 3   amoxicillin-clavulanate (AUGMENTIN) 875-125 MG tablet, Take 1 tablet by mouth 2 (two) times daily for 28 days, Disp: 56 tablet, Rfl: 0   aspirin 81 MG tablet, Take 81 mg by mouth at bedtime., Disp: , Rfl:    Budeson-Glycopyrrol-Formoterol (BREZTRI AEROSPHERE) 160-9-4.8 MCG/ACT AERO, Inhale 2 puffs into the lungs in the morning and at bedtime., Disp: 10.7 g, Rfl: 11   Ca Carbonate-Mag Hydroxide (ROLAIDS PO), Take 3 tablets by mouth daily as needed (acid reflux)., Disp: , Rfl:    clopidogrel (PLAVIX) 75 MG tablet, Take 1 tablet (75 mg total) by mouth daily., Disp: 90 tablet, Rfl: 3   collagenase (SANTYL) 250 UNIT/GM ointment, Apply 1 Application topically daily., Disp:  30 g, Rfl: 0   Continuous Blood Gluc Sensor (FREESTYLE LIBRE 3 SENSOR) MISC, Change sensor every 14 days to monitor blood glucose continuously, Disp: 6 each, Rfl: 3   Continuous Glucose Sensor (FREESTYLE LIBRE 3 SENSOR) MISC, Change sensor every 14 days to monitor blood glucose continuously., Disp: 6 each, Rfl: 3   doxazosin (CARDURA) 2 MG tablet, Take 1 tablet (2 mg total) by mouth daily., Disp: 90 tablet, Rfl: 3   doxycycline (ADOXA) 100 MG tablet, Take 100 mg by mouth 2 (two) times daily., Disp: , Rfl:    doxycycline (VIBRAMYCIN) 100 MG capsule, Take 1 capsule (100 mg total) by mouth 2 (two) times daily for 28 days, Disp: 56 capsule, Rfl: 0   ergocalciferol (VITAMIN D2) 1.25 MG (50000 UT) capsule, Take 1 capsule (50,000 Units total) by mouth 2 (two) times a week every wednesday and sunday, Disp: 24 capsule, Rfl: 3   fluticasone (CUTIVATE) 0.005 % ointment, Apply 1 application externally 2 (two) times daily as needed for irritation, not near eyes for 14 days, Disp: 30 g, Rfl: 1   furosemide (LASIX) 40 MG tablet, Take 1 tablet (40 mg total) by mouth 2 (two) times daily. (Patient taking differently: Take 20 mg by mouth See admin instructions. Take 20 mg daily, may take a second 20 mg dose as needed for swelling), Disp: 180 tablet, Rfl: 3   gabapentin (NEURONTIN) 300 MG capsule, Take 1 capsule (300 mg total) by mouth in the morning AND 2 capsules (600 mg total) every evening., Disp: 270 capsule, Rfl: 1   glucose blood (ONETOUCH VERIO) test strip, USE TO SELF MONITOR BLOOD GLUCOSE UP TO 4 TIMES DAILY (E11.65) 37 100 days, Disp: 400 strip, Rfl: 3   insulin degludec (TRESIBA FLEXTOUCH) 200 UNIT/ML FlexTouch Pen, Inject 66 Units into the skin daily., Disp: 9 mL, Rfl: 5   insulin lispro (HUMALOG KWIKPEN) 100 UNIT/ML KwikPen, Inject 30 Units with each meal into the skin 3 (three) times daily, Disp: 60 mL, Rfl: 5   Insulin Pen Needle 32G X 4 MM MISC, Use three times daily with humalog and one time daily with  tresiba, Disp: 400 each, Rfl: 5   ipratropium (ATROVENT) 0.06 % nasal spray, Place 1 spray into both nostrils daily as needed for rhinitis., Disp: , Rfl:    ipratropium (ATROVENT) 0.06 % nasal spray, Place 1-2 sprays into each nostril 3 (three) times daily as needed for CONGESTION, Disp: 45 mL, Rfl: 1   ketotifen (ZADITOR) 0.025 % ophthalmic solution, Place 2 drops as needed into both eyes (for allergies). , Disp: , Rfl:    loperamide (IMODIUM A-D) 2  MG tablet, Take 1 mg by mouth at bedtime., Disp: , Rfl:    methocarbamol (ROBAXIN) 500 MG tablet, Take 1 tablet (500 mg total) by mouth every evening as needed to reduce pain and muscle spasms, Disp: 90 tablet, Rfl: 3   metoprolol tartrate (LOPRESSOR) 50 MG tablet, Take 1 tablet (50 mg total) by mouth 2 (two) times daily., Disp: 180 tablet, Rfl: 3   montelukast (SINGULAIR) 10 MG tablet, TAKE 1 TABLET BY MOUTH EVERYDAY AT BEDTIME, Disp: 90 tablet, Rfl: 0   nitroGLYCERIN (NITROSTAT) 0.4 MG SL tablet, Place 1 tablet (0.4 mg total) under the tongue every 5 (five) minutes as needed for chest pain, Disp: 25 tablet, Rfl: 5   NOVOFINE 32G X 6 MM MISC, USE ONCE DAILY WITH LEVEMIR PEN, Disp: , Rfl: 1   omeprazole (PRILOSEC) 40 MG capsule, Take 1 capsule (40 mg total) by mouth 2 (two) times daily., Disp: 180 capsule, Rfl: 4   ONETOUCH VERIO test strip, USE TO SELF MONITOR BLOOD GLUCOSE UP TO 4 TIMES DAILY (E11.65), Disp: , Rfl: 4   Polyvinyl Alcohol-Povidone (REFRESH OP), Place 1 drop into both eyes daily as needed (dry eyes)., Disp: , Rfl:    potassium chloride SA (KLOR-CON M20) 20 MEQ tablet, Take 1 tablet (20 mEq total) by mouth daily., Disp: 90 tablet, Rfl: 3   PROAIR HFA 108 (90 Base) MCG/ACT inhaler, Inhale 2 puffs into the lungs every 4 (four) hours as needed for shortness of breath or wheezing., Disp: , Rfl: 3   Psyllium (METAMUCIL PO), Take 1 Scoop by mouth 2 (two) times daily., Disp: , Rfl:    Respiratory Therapy Supplies (FLUTTER) DEVI, Use as directed,  Disp: 1 each, Rfl: 0   rosuvastatin (CRESTOR) 20 MG tablet, Take 1 tablet (20 mg total) by mouth daily., Disp: 90 tablet, Rfl: 3   Semaglutide, 1 MG/DOSE, (OZEMPIC, 1 MG/DOSE,) 4 MG/3ML SOPN, Inject 1 mg into the skin once a week., Disp: 3 mL, Rfl: 11   sodium chloride HYPERTONIC 3 % nebulizer solution, 4 mL inhaled via nebulizer twice daily., Disp: 240 mL, Rfl: 3   traMADol (ULTRAM) 50 MG tablet, Take 1 tablet (50 mg total) 3 (three) times daily as needed by mouth for moderate pain., Disp: 30 tablet, Rfl: 4   traMADol (ULTRAM) 50 MG tablet, Take 1 tablet (50 mg total) by mouth 3 (three) times daily as needed for pain, Disp: 90 tablet, Rfl: 0   triamterene-hydrochlorothiazide (MAXZIDE-25) 37.5-25 MG tablet, Take 1 tablet by mouth daily., Disp: 90 tablet, Rfl: 3  Social History   Tobacco Use  Smoking Status Former   Packs/day: 0.50   Years: 40.00   Additional pack years: 0.00   Total pack years: 20.00   Types: Cigarettes   Quit date: 01/01/2009   Years since quitting: 13.3   Passive exposure: Never  Smokeless Tobacco Never  Tobacco Comments   as of 06-09-2015 per pt last month lite smoker    Allergies  Allergen Reactions   Chantix [Varenicline] Shortness Of Breath   Codeine Nausea And Vomiting and Other (See Comments)    Severe stomach cramps   Amitriptyline Hcl     Unknown   Atorvastatin     Increase LFT's   Glucophage [Metformin] Diarrhea and Nausea And Vomiting   Ranolazine     "nauseated and dizziness"   Lisinopril Cough   Ramipril Cough   Tape Other (See Comments)    Only plastic tape--can use paper tape OK. Blisters   Objective:  There were no vitals filed for this visit. There is no height or weight on file to calculate BMI. Constitutional Well developed. Well nourished.  Vascular Dorsalis pedis pulses nonpalpable bilaterally. Posterior tibial pulses nonpalpable both bilaterally. Capillary refill normal to all digits.  No cyanosis or clubbing noted. Pedal hair  growth normal.  Neurologic Normal speech. Oriented to person, place, and time. Epicritic sensation to light touch grossly present bilaterally.  Dermatologic Left second digit dorsal ulceration with fat layer exposed does not probe down to bone no purulent drainage noted.  No erythema noted.  Orthopedic: Normal joint ROM without pain or crepitus bilaterally. No visible deformities. No bony tenderness.   Radiographs: None Assessment:  No diagnosis found. Plan:  Patient was evaluated and treated and all questions answered.  Left second digit ulceration with fat layer exposed -All questions and concerns were discussed with the patient in extensive detail -He has a recent vascular stenting procedure done 2 weeks ago which has improved circulation to the left second toe. -Patient is currently being managed at the wound care center defer further management to them -I will see him back again for routine foot check in 3 months

## 2022-05-01 NOTE — Progress Notes (Signed)
[name] [id number] Page 1 of 6 Visit Report for 04/30/2022 Arrival Information Details Patient Name: Date of Service: [name] RO LD R. 04/30/2022 12:30 PM Medical Record Number: [id number] Patient Account Number: [id number] Date of Birth/Sex: Treating RN: February 08, 1950 (72 y.o. Hunter [name] Primary Care Hunter [name]: Hunter [name] Other Clinician: Referring Treyten Monestime: Treating Dariel Pellecchia/Extender: Hunter [name] in Treatment: 4 Visit Information History Since Last Visit All ordered tests and consults were completed: Yes Patient Arrived: Wheel Chair Added or deleted any medications: No Arrival Time: 12:34 Any new allergies or adverse reactions: No Accompanied By: wife Had a fall or experienced change in No Transfer Assistance: Stretcher activities of daily living that may affect Patient Identification Verified: Yes risk of falls: Secondary Verification Process Completed: Yes Signs or symptoms of abuse/neglect since last visito No Patient Requires Transmission-Based Precautions: No Hospitalized since last visit: No Patient Has Alerts: Yes Implantable device outside of the clinic excluding No Patient Alerts: ABI R Hunter [name] (03/02/22) cellular tissue based products placed in the center ABI L  (03/02/22) since last visit: Diuretic-Lasix Has Dressing in Place as Prescribed: Yes Plavix Pain Present Now: No Electronic Signature(s) Signed: 04/30/2022 4:01:24 PM By: Hunter [name] Entered By: Hunter [name] on 04/30/2022 12:35:24 -------------------------------------------------------------------------------- Encounter Discharge Information Details Patient Name: Date of Service: [name], [name] 04/30/2022 12:30 PM Medical Record Number: [id number] Patient Account Number: [id number] Date of Birth/Sex: Treating RN: Mar 31, 1950 (72 y.o. Hunter [name] Primary Care Nikka [name]: Hunter [name] Other  Clinician: Referring Kynzi Levay: Treating Hunter [name]/Extender: Hunter [name] in Treatment: 4 Encounter Discharge Information Items Post Procedure Vitals Discharge Condition: Stable Temperature (F): 97.9 Ambulatory Status: Wheelchair Pulse (bpm): 84 Discharge Destination: Home Respiratory Rate (breaths/min): 18 Transportation: Private Auto Blood Pressure (mmHg): 148/72 Accompanied By: spouse Schedule Follow-up Appointment: Yes Clinical Summary of Care: Patient Declined Electronic Signature(s) Signed: 04/30/2022 4:01:24 PM By: Hunter [name] Entered By: Hunter [name] on 04/30/2022 13:10:34 -------------------------------------------------------------------------------- Lower Extremity Assessment Details Patient Name: Date of Service: [name], [name] 04/30/2022 12:30 PM Medical Record Number: [id number] Patient Account Number: [id number] Date of Birth/Sex: Treating RN: 22-Oct-1950 (72 y.o. Hunter [name] Primary Care Hunter [name]: Hunter [name] Other Clinician: Referring Paislei Dorval: Treating Hunter [name]/Extender: Hunter [name] in Treatment: 4 Edema Assessment H[Left: Hunter [name], [name] [Right: [id number] Page 2 of 6] Assessed: [Left: No] [Right: No] Edema: [Left: Ye] [Right: s] Calf Left: Right: Point of Measurement: 29 cm From Medial Instep 41 cm Ankle Left: Right: Point of Measurement: 5 cm From Medial Instep 30 cm Vascular Assessment Pulses: Dorsalis Pedis Palpable: [Left:Yes] Electronic Signature(s) Signed: 04/30/2022 4:01:24 PM By: Hunter [name] Entered By: Hunter [name] on 04/30/2022 12:42:21 -------------------------------------------------------------------------------- Multi Wound Chart Details Patient Name: Date of Service: [name], [name] 04/30/2022 12:30 PM Medical Record Number: [id number] Patient Account Number: [id number] Date of Birth/Sex: Treating  RN: 08/21/1950 (72 y.o. 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: Extremity Chronic Obstructive Pulmonary N/A N/A Comorbid History: Disease (COPD), Sleep Apnea, Hypertension, Myocardial Infarction, Type II Diabetes, Neuropathy 01/25/2022 N/A N/A Date Acquired: 4 N/A  N/A Weeks of Treatment: Open N/A N/A Wound Status: No N/A N/A Wound Recurrence: 0.8x0.7x0.2 N/A N/A Measurements L x W x D (cm) 0.44 N/A N/A A (cm) : rea 0.088 N/A N/A Volume (cm) : 82.00% N/A N/A % Reduction in A rea: 88.00% N/A N/A % Reduction in Volume: Grade 2 N/A N/A Classification: Medium N/A N/A Exudate A mount: Serosanguineous N/A N/A Exudate Type: red, brown N/A N/A Exudate Color: Epibole N/A N/A Wound Margin: Medium (34-66%) N/A N/A Granulation A mount: Red, Pink N/A N/A Granulation QualityAMONTE, [name] [id number] Page 3 of 6 Medium (34-66%) N/A N/A Necrotic Amount: Fat Layer (Subcutaneous Tissue): Yes N/A N/A Exposed Structures: Bone: Yes Fascia: No Tendon: No Muscle: No Joint: No None N/A N/A Epithelialization: Debridement - Excisional N/A N/A Debridement: Pre-procedure Verification/Time Out 12:56 N/A N/A Taken: Lidocaine 4% Topical Solution N/A N/A Pain Control: Subcutaneous, Slough N/A N/A Tissue Debrided: Skin/Subcutaneous Tissue N/A N/A Level: 0.44 N/A N/A Debridement A (sq cm): rea Curette N/A N/A Instrument: Minimum  N/A N/A Bleeding: Pressure N/A N/A Hemostasis A chieved: 0 N/A N/A Procedural Pain: 0 N/A N/A Post Procedural Pain: Procedure was tolerated well N/A N/A Debridement Treatment Response: 0.8x0.7x0.2 N/A N/A Post Debridement Measurements L x W x D (cm) 0.088 N/A N/A Post Debridement Volume: (cm) No Abnormalities Noted N/A N/A Periwound Skin Texture: No Abnormalities Noted N/A N/A Periwound Skin Moisture: No Abnormalities Noted N/A N/A Periwound Skin Color: No Abnormality N/A N/A Temperature: Debridement N/A N/A Procedures Performed: Treatment Notes Electronic Signature(s) Signed: 04/30/2022 1:02:14 PM By: Duanne Guess MD FACS Entered By: Duanne Guess on 04/30/2022 13:02:14 -------------------------------------------------------------------------------- Multi-Disciplinary Care Plan Details Patient Name: Date of Service: [name], [name] 04/30/2022 12:30 PM Medical Record Number: [id number] Patient Account Number: [id number] Date of Birth/Sex: Treating RN: 1950/05/12 (72 y.o. Hunter [name] Primary Care Celestine Prim: Hunter [name] Other Clinician: Referring Jaziah Kwasnik: Treating Amauri Keefe/Extender: Hunter [name] in Treatment: 4 Active Inactive Wound/Skin Impairment Nursing Diagnoses: Impaired tissue integrity Goals: Patient/caregiver will verbalize understanding of skin care regimen Date Initiated: 04/02/2022 Target Resolution Date: 07/01/2022 Goal Status: Active Interventions: Assess ulceration(s) every visit Treatment Activities: Skin care regimen initiated : 04/02/2022 Notes: Electronic Signature(s) Signed: 04/30/2022 4:01:24 PM By: Hunter [name] Entered By: Hunter [name] on 04/30/2022 12:48:12 Hunter [name] ([id number]) [id number] Page 4 of 6 -------------------------------------------------------------------------------- Pain Assessment Details Patient Name: Date of Service: [name]  RO LD R. 04/30/2022 12:30 PM Medical Record Number: [id number] Patient Account Number: [id number] Date of Birth/Sex: Treating RN: 1950/03/28 (72 y.o. Hunter [name] Primary Care Kevyn Wengert: Hunter [name] Other Clinician: Referring Haywood Meinders: Treating Kemaya Dorner/Extender: Hunter [name] in Treatment: 4 Active Problems Location of Pain Severity and Description of Pain Patient Has Paino No Site Locations Pain Management and Medication Current Pain Management: Electronic Signature(s) Signed: 04/30/2022 4:01:24 PM By: Hunter [name] Entered By: Hunter [name] on 04/30/2022 12:38:56 -------------------------------------------------------------------------------- Patient/Caregiver Education Details Patient Name: Date of Service: [name] RO LD R. 4/29/2024andnbsp12:30 PM Medical Record Number: [id number] Patient Account Number: [id number] Date of Birth/Gender: Treating RN: [birth year] (72 y.o. Hunter [name] Primary Care Physician: Hunter [name] Other Clinician: Referring Physician: Treating Physician/Extender: Hunter [name] in Treatment: 4 Education Assessment Education Provided To: Patient and Caregiver Education Topics Provided Wound/Skin Impairment: Methods: Explain/Verbal Responses: State content correctly Electronic Signature(s) Signed: 04/30/2022 4:01:24 PM By: Hunter [name] Entered By: Hunter [name] on 04/30/2022 12:48:42 Wound Assessment Details -------------------------------------------------------------------------------- Hunter [name] ([id number]) [id number]  Page 5 of 6 Patient Name: Date of Service: Hunter [name] RO LD R. 04/30/2022 12:30 PM Medical Record Number: [id number] Patient Account Number: [id number] Date of Birth/Sex: Treating RN: [birth year] (72 y.o. Hunter [name] Primary Care Yusef Lamp: Hunter [name] Other Clinician: Referring Mechel Schutter: Treating  Beadie Matsunaga/Extender: Hunter [name] in Treatment: 4 Wound Status Wound Number: 1 Primary Diabetic Wound/Ulcer of the Lower Extremity Etiology: Wound Location: Left T Second oe Wound Open Wounding Event: Gradually Appeared Status: Date Acquired: 01/25/2022 Comorbid Chronic Obstructive Pulmonary Disease (COPD), Sleep Apnea, Weeks Of Treatment: 4 History: Hypertension, Myocardial Infarction, Type II Diabetes, Neuropathy Clustered Wound: No Photos Wound Measurements Length: (cm) 0.8 % Reduction in Area: 82% Width: (cm) 0.7 % Reduction in Volume: 88% Depth: (cm) 0.2 Epithelialization: None Area: (cm) 0.44 Tunneling: No Volume: (cm) 0.088 Undermining: No Wound Description Classification: Grade 2 Foul Odor After Cleansing: No Wound Margin: Epibole Slough/Fibrino Yes Exudate Amount: Medium Exudate Type: Serosanguineous Exudate Color: red, brown Wound Bed Granulation Amount: Medium (34-66%) Exposed Structure Granulation Quality: Red, Pink Fascia Exposed: No Necrotic Amount: Medium (34-66%) Fat Layer (Subcutaneous Tissue) Exposed: Yes Necrotic Quality: Adherent Slough Tendon Exposed: No Muscle Exposed: No Joint Exposed: No Bone Exposed: Yes Periwound Skin Texture Texture Color No Abnormalities Noted: Yes No Abnormalities Noted: Yes Moisture Temperature / Pain No Abnormalities Noted: Yes Temperature: No Abnormality Treatment Notes Wound #1 (Toe Second) Wound Laterality: Left Cleanser Soap and Water Discharge Instruction: May shower and wash wound with dial antibacterial soap and water prior to dressing change. Wound Cleanser Discharge Instruction: Cleanse the wound with wound cleanser prior to applying a clean dressing using gauze sponges, not tissue or cotton balls. Peri-Wound Care Topical Primary Dressing Hunter [name], Hunter [name] ([id number]) [phone].pdf Page 6 of 6 Maxorb Extra Calcium Alginate, 2x2 (in/in) Discharge  Instruction: Apply to wound bed as instructed Secondary Dressing Woven Gauze Sponges 2x2 in Discharge Instruction: Apply over primary dressing as directed. Secured With Conforming Stretch Gauze Bandage, Sterile 2x75 (in/in) Discharge Instruction: Secure with stretch gauze as directed. Paper Tape, 2x10 (in/yd) Discharge Instruction: Secure dressing with tape as directed. Netting, Tubular #2 Discharge Instruction: apply over dressing Compression Wrap Compression Stockings Add-Ons Electronic Signature(s) Signed: 04/30/2022 4:01:24 PM By: Hunter [name] Entered By: Hunter [name] on 04/30/2022 12:47:15 -------------------------------------------------------------------------------- Vitals Details Patient Name: Date of Service: [name], [name] 04/30/2022 12:30 PM Medical Record Number: [id number] Patient Account Number: [id number] Date of Birth/Sex: Treating RN: [birth year] (72 y.o. Hunter [name] Primary Care Jaynia Fendley: Hunter [name] Other Clinician: Referring Temprence Rhines: Treating Daleisa Halperin/Extender: Hunter [name] in Treatment: 4 Vital Signs Time Taken: 12:35 Temperature (F): 97.8 Height (in): 66 Pulse (bpm): 78 Weight (lbs): 235 Respiratory Rate (breaths/min): 18 Body Mass Index (BMI): 37.9 Blood Pressure (mmHg): 139/76 Capillary Blood Glucose (mg/dl): 644 Reference Range: 80 - 120 mg / dl Electronic Signature(s) Signed: 04/30/2022 4:01:24 PM By: Hunter [name] Entered By: Hunter [name] on 04/30/2022 12:38:45

## 2022-05-01 NOTE — Progress Notes (Signed)
ASHTEN, PRATS ([id number]) [id number] Page 1 of 8 Visit Report for 04/30/2022 Chief Complaint Document Details Patient Name: Date of Service: [name] RO LD R. 04/30/2022 12:30 PM Medical Record Number: [id number] Patient Account Number: [id number] Date of Birth/Sex: Treating RN: [birth year] (72 y.o. M) Primary Care Provider: Jarome Matin Other Clinician: Referring Provider: Treating Provider/Extender: Marena Chancy in Treatment: 4 Information Obtained from: Patient Chief Complaint Patients presents for treatment of an open diabetic ulcer Electronic Signature(s) Signed: 04/30/2022 1:02:22 PM By: Duanne Guess MD FACS Entered By: Duanne Guess on 04/30/2022 13:02:22 -------------------------------------------------------------------------------- Debridement Details Patient Name: Date of Service: [name], [name] 04/30/2022 12:30 PM Medical Record Number: [id number] Patient Account Number: [id number] Date of Birth/Sex: Treating RN: Dec 25, 1950 (72 y.o. Yates Decamp Primary Care Provider: Jarome Matin Other Clinician: Referring Provider: Treating Provider/Extender: Marena Chancy in Treatment: 4 Debridement Performed for Assessment: Wound #1 Left T Second oe Performed By: Physician Duanne Guess, MD Debridement Type: Debridement Severity of Tissue Pre Debridement: Fat layer exposed Level of Consciousness (Pre-procedure): Awake and Alert Pre-procedure Verification/Time Out Yes - 12:56 Taken: Start Time: 12:57 Pain Control: Lidocaine 4% T opical Solution Percent of Wound Bed Debrided: 100% T Area Debrided (cm): otal 0.44 Tissue and other material debrided: Viable, Non-Viable, Slough, Subcutaneous, Slough Level: Skin/Subcutaneous Tissue Debridement Description: Excisional Instrument: Curette Bleeding: Minimum Hemostasis Achieved: Pressure End Time:  12:59 Procedural Pain: 0 Post Procedural Pain: 0 Response to Treatment: Procedure was tolerated well Level of Consciousness (Post- Awake and Alert procedure): Post Debridement Measurements of Total Wound Length: (cm) 0.8 Width: (cm) 0.7 Depth: (cm) 0.2 Volume: (cm) 0.088 Character of Wound/Ulcer Post Debridement: Improved Severity of Tissue Post Debridement: Fat layer exposed Post Procedure Diagnosis Same as Pre-procedure Notes Scribed for Dr Lady Gary by Brenton Grills RN. JAQUES, MINEER ([id number]) [id number] Page 2 of 8 Electronic Signature(s) Signed: 04/30/2022 4:00:45 PM By: Duanne Guess MD FACS Signed: 04/30/2022 4:01:24 PM By: Brenton Grills Entered By: Brenton Grills on 04/30/2022 12:59:28 -------------------------------------------------------------------------------- HPI Details Patient Name: Date of Service: [name], [name] 04/30/2022 12:30 PM Medical Record Number: [id number] Patient Account Number: [id number] Date of Birth/Sex: Treating RN: [birth year] (72 y.o. M) Primary Care Provider: Jarome Matin Other Clinician: Referring Provider: Treating Provider/Extender: Marena Chancy in Treatment: 4 History of Present Illness HPI Description: ADMISSION 04/02/2022 This is a 72 year old man with multiple comorbidities including congestive heart failure, chronic ischemic heart disease, COPD, oxygen dependence, diabetes (last hemoglobin A1c 7.2%), morbid obesity, peripheral artery disease, chronic venous insufficiency, and peripheral neuropathy, among others. He routinely follows with podiatry for his diabetic footcare. In February, he was being seen for his regular nail debridement and was noted to have an ulceration on the second digit of his left foot. He had diminished pedal pulses and he was referred for ABI testing. ABIs were noncompressible bilaterally and he had abnormal diminished TBI's of 0. 4 7 on  the right and 0.33 on the left. Based upon these findings, along with a nonhealing ulcer, vascular surgery was consulted. On March 13, 2022, he had an aortogram with left lower extremity runoff. Stents were placed in his left superficial femoral-popliteal artery, left external iliac, and left common iliac artery. Post stenting, he had three-vessel runoff. He saw Dr. Allena Katz after undergoing the stent procedure. As he already had a wound care consultation scheduled, Dr. Allena Katz simply had him continue dressing changes until his visit today. On the  dorsal surface of his left second toe, there is an ulcer with slough and nonviable tendon visible. On further inspection, the joint space of the bone and the end of the proximal phalanx are both visible. 04/10/2022: The biopsy that I took last week showed chronic inflammation, but no osteomyelitis. The wound is cleaner this week with granulation tissue beginning to fill in. There is still a little bit of tendon exposed. 04/20/2022: There is more granulation tissue filling in the wound. Bone remains exposed with some slough accumulation. He saw vascular surgery earlier this week and has improved toe pressures although there is some evidence of stenosis in both his common and external iliac stents as well as stenosis proximal to the stent. They plan to bring him back for short-term interval follow-up to monitor this closely and intervene if necessary. 04/30/2022: The wound is smaller with good granulation tissue. Bone is no longer exposed. There is some slough accumulation. Electronic Signature(s) Signed: 04/30/2022 1:02:53 PM By: Duanne Guess MD FACS Entered By: Duanne Guess on 04/30/2022 13:02:52 -------------------------------------------------------------------------------- Physical Exam Details Patient Name: Date of Service: [name], [name] 04/30/2022 12:30 PM Medical Record Number: [id number] Patient Account Number: [id number] Date of  Birth/Sex: Treating RN: February 24, 1950 (72 y.o. M) Primary Care Provider: Jarome Matin Other Clinician: Referring Provider: Treating Provider/Extender: Marena Chancy in Treatment: 4 Constitutional . . . . no acute distress. Respiratory Normal work of breathing on room air. Notes 04/30/2022: The wound is smaller with good granulation tissue. Bone is no longer exposed. There is some slough accumulation. Electronic Signature(s) Signed: 04/30/2022 1:03:17 PM By: Duanne Guess MD FACS Entered By: Duanne Guess on 04/30/2022 13:03:17 [name] ([id number]) [id number] Page 3 of 8 -------------------------------------------------------------------------------- Physician Orders Details Patient Name: Date of Service: [name] RO LD R. 04/30/2022 12:30 PM Medical Record Number: [id number] Patient Account Number: [id number] Date of Birth/Sex: Treating RN: 15-Jul-1950 (72 y.o. Yates Decamp Primary Care Provider: Jarome Matin Other Clinician: Referring Provider: Treating Provider/Extender: Marena Chancy in Treatment: 4 Verbal / Phone Orders: No Diagnosis Coding Follow-up Appointments ppointment in 1 week. - Dr. Lady Gary Room 3 Return A Anesthetic (In clinic) Topical Lidocaine 4% applied to wound bed - Used in Clinic prior to debridement Cellular or Tissue Based Products Wound #1 Left T Second oe Cellular or Tissue Based Product Type: - RUN IVR for Epicord Bathing/ Shower/ Hygiene May shower and wash wound with soap and water. - May change dressing after bathing Edema Control - Lymphedema / SCD / Other Bilateral Lower Extremities Avoid standing for long periods of time. Patient to wear own compression stockings every day. Other Edema Control Orders/Instructions: - Elevate feet throughout the day Off-Loading Other: - Place pillows under ankles to off-load-especially while in bed Wound  Treatment Wound #1 - T Second oe Wound Laterality: Left Cleanser: Soap and Water 1 x Per Day/30 Days Discharge Instructions: May shower and wash wound with dial antibacterial soap and water prior to dressing change. Cleanser: Wound Cleanser (Generic) 1 x Per Day/30 Days Discharge Instructions: Cleanse the wound with wound cleanser prior to applying a clean dressing using gauze sponges, not tissue or cotton balls. Prim Dressing: Maxorb Extra Calcium Alginate, 2x2 (in/in) (Generic) 1 x Per Day/30 Days ary Discharge Instructions: Apply to wound bed as instructed Secondary Dressing: Woven Gauze Sponges 2x2 in (Generic) 1 x Per Day/30 Days Discharge Instructions: Apply over primary dressing as directed. Secured With: Insurance underwriter, Sterile  2x75 (in/in) (Generic) 1 x Per Day/30 Days Discharge Instructions: Secure with stretch gauze as directed. Secured With: Paper Tape, 2x10 (in/yd) (Generic) 1 x Per Day/30 Days Discharge Instructions: Secure dressing with tape as directed. Secured With: Netting, Tubular #2 (Generic) 1 x Per Day/30 Days Discharge Instructions: apply over dressing Electronic Signature(s) Signed: 04/30/2022 4:00:45 PM By: Duanne Guess MD FACS Entered By: Duanne Guess on 04/30/2022 13:03:28 -------------------------------------------------------------------------------- Problem List Details Patient Name: Date of Service: [name], [name] 04/30/2022 12:30 PM Medical Record Number: [id number] Patient Account Number: [id number] Date of Birth/Sex: Treating RN: March 20, 1950 (72 y.o. M) Primary Care Provider: Jarome Matin Other Clinician: Referring Provider: Treating Provider/Extender: Marena Chancy in Treatment: 4 DEONTAE, ROBSON R ([id number]) [id number] Page 4 of 8 Active Problems ICD-10 Encounter Code Description Active Date MDM Diagnosis L97.526 Non-pressure chronic ulcer of other  part of left foot with bone involvement 04/02/2022 No Yes without evidence of necrosis E11.621 Type 2 diabetes mellitus with foot ulcer 04/02/2022 No Yes I73.9 Peripheral vascular disease, unspecified 04/02/2022 No Yes I25.9 Chronic ischemic heart disease, unspecified 04/02/2022 No Yes I87.2 Venous insufficiency (chronic) (peripheral) 04/02/2022 No Yes I50.30 Unspecified diastolic (congestive) heart failure 04/02/2022 No Yes I27.20 Pulmonary hypertension, unspecified 04/02/2022 No Yes J44.9 Chronic obstructive pulmonary disease, unspecified 04/02/2022 No Yes Z99.81 Dependence on supplemental oxygen 04/02/2022 No Yes Inactive Problems Resolved Problems Electronic Signature(s) Signed: 04/30/2022 1:02:07 PM By: Duanne Guess MD FACS Entered By: Duanne Guess on 04/30/2022 13:02:07 -------------------------------------------------------------------------------- Progress Note Details Patient Name: Date of Service: [name], [name] 04/30/2022 12:30 PM Medical Record Number: [id number] Patient Account Number: [id number] Date of Birth/Sex: Treating RN: 11/21/1950 (72 y.o. M) Primary Care Provider: Jarome Matin Other Clinician: Referring Provider: Treating Provider/Extender: Marena Chancy in Treatment: 4 Subjective Chief Complaint Information obtained from Patient Patients presents for treatment of an open diabetic ulcer History of Present Illness (HPI) ADMISSION 04/02/2022 This is a 72 year old man with multiple comorbidities including congestive heart failure, chronic ischemic heart disease, COPD, oxygen dependence, diabetes (last hemoglobin A1c 7.2%), morbid obesity, peripheral artery disease, chronic venous insufficiency, and peripheral neuropathy, among others. He routinely follows with podiatry for his diabetic footcare. In February, he was being seen for his regular nail debridement and was noted to have an ulceration on the JIMMY, PLESSINGER ([id number])  [id number] Page 5 of 8 second digit of his left foot. He had diminished pedal pulses and he was referred for ABI testing. ABIs were noncompressible bilaterally and he had abnormal diminished TBI's of 0. 4 7 on the right and 0.33 on the left. Based upon these findings, along with a nonhealing ulcer, vascular surgery was consulted. On March 13, 2022, he had an aortogram with left lower extremity runoff. Stents were placed in his left superficial femoral-popliteal artery, left external iliac, and left common iliac artery. Post stenting, he had three-vessel runoff. He saw Dr. Allena Katz after undergoing the stent procedure. As he already had a wound care consultation scheduled, Dr. Allena Katz simply had him continue dressing changes until his visit today. On the dorsal surface of his left second toe, there is an ulcer with slough and nonviable tendon visible. On further inspection, the joint space of the bone and the end of the proximal phalanx are both visible. 04/10/2022: The biopsy that I took last week showed chronic inflammation, but no osteomyelitis. The wound is cleaner this week with granulation tissue beginning to fill in. There is still a little bit  of tendon exposed. 04/20/2022: There is more granulation tissue filling in the wound. Bone remains exposed with some slough accumulation. He saw vascular surgery earlier this week and has improved toe pressures although there is some evidence of stenosis in both his common and external iliac stents as well as stenosis proximal to the stent. They plan to bring him back for short-term interval follow-up to monitor this closely and intervene if necessary. 04/30/2022: The wound is smaller with good granulation tissue. Bone is no longer exposed. There is some slough accumulation. Patient History Social History Former smoker - ended on 01/01/2009, Marital Status - Married, Alcohol Use - Never, Drug Use - No History, Caffeine Use -  Never. Medical History Respiratory Patient has history of Chronic Obstructive Pulmonary Disease (COPD), Sleep Apnea - wears a CPAP Cardiovascular Patient has history of Hypertension, Myocardial Infarction Endocrine Patient has history of Type II Diabetes Neurologic Patient has history of Neuropathy Hospitalization/Surgery History - CABG (2005);[name] Medical A Surgical History Notes nd Cardiovascular Carotid Artery Disease Chronic Venous Insufficiency Peripheral arterial occlusive disease Gastrointestinal GERD Genitourinary ED (of organic origin) Urinary Incontinence Musculoskeletal Left Foot - Foot Drop Neurologic Left Foot Drop Objective Constitutional no acute distress. Vitals Time Taken: 12:35 PM, Height: 66 in, Weight: 235 lbs, BMI: 37.9, Temperature: 97.8 F, Pulse: 78 bpm, Respiratory Rate: 18 breaths/min, Blood Pressure: 139/76 mmHg, Capillary Blood Glucose: 110 mg/dl. Respiratory Normal work of breathing on room air. General Notes: 04/30/2022: The wound is smaller with good granulation tissue. Bone is no longer exposed. There is some slough accumulation. Integumentary (Hair, Skin) Wound #1 status is Open. Original cause of wound was Gradually Appeared. The date acquired was: 01/25/2022. The wound has been in treatment 4 weeks. The wound is located on the Left T Second. The wound measures 0.8cm length x 0.7cm width x 0.2cm depth; 0.44cm^2 area and 0.088cm^3 volume. There is oe bone and Fat Layer (Subcutaneous Tissue) exposed. There is no tunneling or undermining noted. There is a medium amount of serosanguineous drainage noted. The wound margin is epibole. There is medium (34-66%) red, pink granulation within the wound bed. There is a medium (34-66%) amount of necrotic tissue within the wound bed including Adherent Slough. The periwound skin appearance had no abnormalities noted for texture. The periwound skin appearance had no abnormalities noted for moisture. The periwound  skin appearance had no abnormalities noted for color. Periwound temperature was noted as No Abnormality. Assessment Active Problems ICD-10 Non-pressure chronic ulcer of other part of left foot with bone involvement without evidence of necrosis AUDON, HEYMANN ([id number]) [id number] Page 6 of 8 Type 2 diabetes mellitus with foot ulcer Peripheral vascular disease, unspecified Chronic ischemic heart disease, unspecified Venous insufficiency (chronic) (peripheral) Unspecified diastolic (congestive) heart failure Pulmonary hypertension, unspecified Chronic obstructive pulmonary disease, unspecified Dependence on supplemental oxygen Procedures Wound #1 Pre-procedure diagnosis of Wound #1 is a Diabetic Wound/Ulcer of the Lower Extremity located on the Left T Second .Severity of Tissue Pre Debridement oe is: Fat layer exposed. There was a Excisional Skin/Subcutaneous Tissue Debridement with a total area of 0.44 sq cm performed by Duanne Guess, MD. With the following instrument(s): Curette to remove Viable and Non-Viable tissue/material. Material removed includes Subcutaneous Tissue and Slough and after achieving pain control using Lidocaine 4% Topical Solution. No specimens were taken. A time out was conducted at 12:56, prior to the start of the procedure. A Minimum amount of bleeding was controlled with Pressure. The procedure was tolerated well with a pain level of 0  throughout and a pain level of 0 following the procedure. Post Debridement Measurements: 0.8cm length x 0.7cm width x 0.2cm depth; 0.088cm^3 volume. Character of Wound/Ulcer Post Debridement is improved. Severity of Tissue Post Debridement is: Fat layer exposed. Post procedure Diagnosis Wound #1: Same as Pre-Procedure General Notes: Scribed for Dr Lady Gary by Brenton Grills RN.[name] Plan Follow-up Appointments: Return Appointment in 1 week. - Dr. Lady Gary Room 3 Anesthetic: (In clinic) Topical  Lidocaine 4% applied to wound bed - Used in Clinic prior to debridement Cellular or Tissue Based Products: Wound #1 Left T Second: oe Cellular or Tissue Based Product Type: - RUN IVR for Epicord Bathing/ Shower/ Hygiene: May shower and wash wound with soap and water. - May change dressing after bathing Edema Control - Lymphedema / SCD / Other: Avoid standing for long periods of time. Patient to wear own compression stockings every day. Other Edema Control Orders/Instructions: - Elevate feet throughout the day Off-Loading: Other: - Place pillows under ankles to off-load-especially while in bed WOUND #1: - T Second Wound Laterality: Left oe Cleanser: Soap and Water 1 x Per Day/30 Days Discharge Instructions: May shower and wash wound with dial antibacterial soap and water prior to dressing change. Cleanser: Wound Cleanser (Generic) 1 x Per Day/30 Days Discharge Instructions: Cleanse the wound with wound cleanser prior to applying a clean dressing using gauze sponges, not tissue or cotton balls. Prim Dressing: Maxorb Extra Calcium Alginate, 2x2 (in/in) (Generic) 1 x Per Day/30 Days ary Discharge Instructions: Apply to wound bed as instructed Secondary Dressing: Woven Gauze Sponges 2x2 in (Generic) 1 x Per Day/30 Days Discharge Instructions: Apply over primary dressing as directed. Secured With: Insurance underwriter, Sterile 2x75 (in/in) (Generic) 1 x Per Day/30 Days Discharge Instructions: Secure with stretch gauze as directed. Secured With: Paper T ape, 2x10 (in/yd) (Generic) 1 x Per Day/30 Days Discharge Instructions: Secure dressing with tape as directed. Secured With: Netting, Tubular #2 (Generic) 1 x Per Day/30 Days Discharge Instructions: apply over dressing 04/30/2022: The wound is smaller with good granulation tissue. Bone is no longer exposed. There is some slough accumulation. I used a curette to debride slough and subcutaneous tissue from the wound. We will continue  silver alginate for now. We are awaiting insurance approval for Epicord. He will follow-up in 1 week. Electronic Signature(s) Signed: 04/30/2022 1:03:58 PM By: Duanne Guess MD FACS Entered By: Duanne Guess on 04/30/2022 13:03:58 -------------------------------------------------------------------------------- HxROS Details Patient Name: Date of Service: [name], [name] 04/30/2022 12:30 PM Medical Record Number: [id number] Patient Account Number: [id number] LINDBERG, ZENON ([id number]) [id number] Page 7 of 8 Date of Birth/Sex: Treating RN: [birth year] (72 y.o. M) Primary Care Provider: Jarome Matin Other Clinician: Referring Provider: Treating Provider/Extender: Marena Chancy in Treatment: 4 Respiratory Medical History: Positive for: Chronic Obstructive Pulmonary Disease (COPD); Sleep Apnea - wears a CPAP Cardiovascular Medical History: Positive for: Hypertension; Myocardial Infarction Past Medical History Notes: Carotid Artery Disease Chronic Venous Insufficiency Peripheral arterial occlusive disease Gastrointestinal Medical History: Past Medical History Notes: GERD Endocrine Medical History: Positive for: Type II Diabetes Time with diabetes: 15 years Treated with: Insulin Blood sugar tested every day: Yes Tested : 2 x day Genitourinary Medical History: Past Medical History Notes: ED (of organic origin) Urinary Incontinence Musculoskeletal Medical History: Past Medical History Notes: Left Foot - Foot Drop Neurologic Medical History: Positive for: Neuropathy Past Medical History Notes: Left Foot Drop Immunizations Pneumococcal Vaccine: Received Pneumococcal Vaccination: No Implantable Devices None Hospitalization /  Surgery History Type of Hospitalization/Surgery CABG (2005); Family and Social History Former smoker - ended on 01/01/2009; Marital Status - Married; Alcohol Use: Never; Drug  Use: No History; Caffeine Use: Never; Financial Concerns: No; Food, Clothing or Shelter Needs: No; Support System Lacking: No; Transportation Concerns: No Electronic Signature(s) Signed: 04/30/2022 4:00:45 PM By: Duanne Guess MD FACS Entered By: Duanne Guess on 04/30/2022 13:02:57 [name] ([id number]) [id number] Page 8 of 8 -------------------------------------------------------------------------------- SuperBill Details Patient Name: Date of Service: [name] RO LD R. 04/30/2022 Medical Record Number: [id number] Patient Account Number: [id number] Date of Birth/Sex: Treating RN: 18-Feb-1950 (72 y.o. Yates Decamp Primary Care Provider: Jarome Matin Other Clinician: Referring Provider: Treating Provider/Extender: Marena Chancy in Treatment: 4 Diagnosis Coding ICD-10 Codes Code Description [phone] Non-pressure chronic ulcer of other part of left foot with bone involvement without evidence of necrosis E11.621 Type 2 diabetes mellitus with foot ulcer I73.9 Peripheral vascular disease, unspecified I25.9 Chronic ischemic heart disease, unspecified I87.2 Venous insufficiency (chronic) (peripheral) I50.30 Unspecified diastolic (congestive) heart failure I27.20 Pulmonary hypertension, unspecified J44.9 Chronic obstructive pulmonary disease, unspecified Z99.81 Dependence on supplemental oxygen Facility Procedures : CPT4 Code: [id number] Description: 11042 - DEB SUBQ TISSUE 20 SQ CM/< ICD-10 Diagnosis Description L97.526 Non-pressure chronic ulcer of other part of left foot with bone involvement wit Modifier: hout evidence of necr Quantity: 1 osis Physician Procedures : CPT4 Code Description Modifier [id number] 99213 - WC PHYS LEVEL 3 - EST PT 25 ICD-10 Diagnosis Description L97.526 Non-pressure chronic ulcer of other part of left foot with bone involvement without evidence of necr E11.621 Type 2 diabetes mellitus  with  foot ulcer I73.9 Peripheral vascular disease, unspecified I87.2 Venous insufficiency (chronic) (peripheral) Quantity: 1 osis : [id number] 11042 - WC PHYS SUBQ TISS 20 SQ CM ICD-10 Diagnosis Description L97.526 Non-pressure chronic ulcer of other part of left foot with bone involvement without evidence of necr Quantity: 1 osis Electronic Signature(s) Signed: 04/30/2022 1:04:21 PM By: Duanne Guess MD FACS Entered By: Duanne Guess on 04/30/2022 13:04:21

## 2022-05-07 ENCOUNTER — Other Ambulatory Visit (HOSPITAL_COMMUNITY): Payer: Self-pay

## 2022-05-08 ENCOUNTER — Other Ambulatory Visit (HOSPITAL_COMMUNITY): Payer: Self-pay

## 2022-05-09 ENCOUNTER — Other Ambulatory Visit (HOSPITAL_COMMUNITY): Payer: Self-pay

## 2022-05-11 ENCOUNTER — Encounter (HOSPITAL_BASED_OUTPATIENT_CLINIC_OR_DEPARTMENT_OTHER): Payer: Medicare Other | Attending: General Surgery | Admitting: General Surgery

## 2022-05-11 ENCOUNTER — Ambulatory Visit (HOSPITAL_COMMUNITY)
Admission: RE | Admit: 2022-05-11 | Discharge: 2022-05-11 | Disposition: A | Discharge status: Home / Self Care | Payer: Medicare Other | Source: Ambulatory Visit | Attending: General Surgery | Admitting: General Surgery

## 2022-05-11 ENCOUNTER — Other Ambulatory Visit (HOSPITAL_COMMUNITY): Payer: Self-pay | Admitting: General Surgery

## 2022-05-11 DIAGNOSIS — J449 Chronic obstructive pulmonary disease, unspecified: Secondary | ICD-10-CM | POA: Insufficient documentation

## 2022-05-11 DIAGNOSIS — L97526 Non-pressure chronic ulcer of other part of left foot with bone involvement without evidence of necrosis: Secondary | ICD-10-CM | POA: Insufficient documentation

## 2022-05-11 DIAGNOSIS — I272 Pulmonary hypertension, unspecified: Secondary | ICD-10-CM | POA: Insufficient documentation

## 2022-05-11 DIAGNOSIS — I872 Venous insufficiency (chronic) (peripheral): Secondary | ICD-10-CM | POA: Insufficient documentation

## 2022-05-11 DIAGNOSIS — E13621 Other specified diabetes mellitus with foot ulcer: Secondary | ICD-10-CM | POA: Insufficient documentation

## 2022-05-11 DIAGNOSIS — Z9981 Dependence on supplemental oxygen: Secondary | ICD-10-CM | POA: Insufficient documentation

## 2022-05-11 DIAGNOSIS — E1151 Type 2 diabetes mellitus with diabetic peripheral angiopathy without gangrene: Secondary | ICD-10-CM | POA: Insufficient documentation

## 2022-05-11 DIAGNOSIS — I5032 Chronic diastolic (congestive) heart failure: Secondary | ICD-10-CM | POA: Diagnosis not present

## 2022-05-11 DIAGNOSIS — I110 Hypertensive heart disease with heart failure: Secondary | ICD-10-CM | POA: Diagnosis not present

## 2022-05-11 DIAGNOSIS — E11621 Type 2 diabetes mellitus with foot ulcer: Secondary | ICD-10-CM | POA: Insufficient documentation

## 2022-05-18 ENCOUNTER — Other Ambulatory Visit (HOSPITAL_COMMUNITY): Payer: Self-pay

## 2022-05-18 ENCOUNTER — Other Ambulatory Visit: Payer: Self-pay

## 2022-05-25 ENCOUNTER — Encounter (HOSPITAL_BASED_OUTPATIENT_CLINIC_OR_DEPARTMENT_OTHER): Payer: Medicare Other | Admitting: General Surgery

## 2022-05-25 ENCOUNTER — Other Ambulatory Visit (HOSPITAL_COMMUNITY): Payer: Self-pay

## 2022-05-25 DIAGNOSIS — I5032 Chronic diastolic (congestive) heart failure: Secondary | ICD-10-CM | POA: Diagnosis not present

## 2022-05-25 MED ORDER — SULFAMETHOXAZOLE-TRIMETHOPRIM 800-160 MG PO TABS
1.0000 | ORAL_TABLET | Freq: Two times a day (BID) | ORAL | 0 refills | Status: DC
  Filled 2022-05-25: qty 28, 14d supply, fill #0

## 2022-05-25 MED ORDER — LEVOFLOXACIN 750 MG PO TABS
750.0000 mg | ORAL_TABLET | Freq: Every day | ORAL | 0 refills | Status: DC
  Filled 2022-05-25: qty 14, 14d supply, fill #0

## 2022-05-31 ENCOUNTER — Other Ambulatory Visit (HOSPITAL_COMMUNITY): Payer: Self-pay

## 2022-06-08 ENCOUNTER — Encounter (HOSPITAL_BASED_OUTPATIENT_CLINIC_OR_DEPARTMENT_OTHER): Payer: Medicare Other | Attending: General Surgery | Admitting: General Surgery

## 2022-06-08 DIAGNOSIS — L97526 Non-pressure chronic ulcer of other part of left foot with bone involvement without evidence of necrosis: Secondary | ICD-10-CM | POA: Diagnosis present

## 2022-06-08 DIAGNOSIS — Z9981 Dependence on supplemental oxygen: Secondary | ICD-10-CM | POA: Diagnosis not present

## 2022-06-08 DIAGNOSIS — I110 Hypertensive heart disease with heart failure: Secondary | ICD-10-CM | POA: Insufficient documentation

## 2022-06-08 DIAGNOSIS — Z87891 Personal history of nicotine dependence: Secondary | ICD-10-CM | POA: Diagnosis not present

## 2022-06-08 DIAGNOSIS — E11621 Type 2 diabetes mellitus with foot ulcer: Secondary | ICD-10-CM | POA: Insufficient documentation

## 2022-06-08 DIAGNOSIS — J449 Chronic obstructive pulmonary disease, unspecified: Secondary | ICD-10-CM | POA: Diagnosis not present

## 2022-06-08 DIAGNOSIS — I272 Pulmonary hypertension, unspecified: Secondary | ICD-10-CM | POA: Insufficient documentation

## 2022-06-08 DIAGNOSIS — Z6837 Body mass index (BMI) 37.0-37.9, adult: Secondary | ICD-10-CM | POA: Insufficient documentation

## 2022-06-08 DIAGNOSIS — G4730 Sleep apnea, unspecified: Secondary | ICD-10-CM | POA: Insufficient documentation

## 2022-06-08 DIAGNOSIS — E1151 Type 2 diabetes mellitus with diabetic peripheral angiopathy without gangrene: Secondary | ICD-10-CM | POA: Diagnosis not present

## 2022-06-08 DIAGNOSIS — I252 Old myocardial infarction: Secondary | ICD-10-CM | POA: Diagnosis not present

## 2022-06-08 DIAGNOSIS — I259 Chronic ischemic heart disease, unspecified: Secondary | ICD-10-CM | POA: Insufficient documentation

## 2022-06-08 DIAGNOSIS — I872 Venous insufficiency (chronic) (peripheral): Secondary | ICD-10-CM | POA: Insufficient documentation

## 2022-06-08 DIAGNOSIS — I5032 Chronic diastolic (congestive) heart failure: Secondary | ICD-10-CM | POA: Insufficient documentation

## 2022-06-10 ENCOUNTER — Other Ambulatory Visit (HOSPITAL_COMMUNITY): Payer: Self-pay

## 2022-06-11 ENCOUNTER — Other Ambulatory Visit (HOSPITAL_COMMUNITY): Payer: Self-pay

## 2022-06-11 ENCOUNTER — Other Ambulatory Visit: Payer: Self-pay

## 2022-06-12 ENCOUNTER — Other Ambulatory Visit (HOSPITAL_COMMUNITY): Payer: Self-pay

## 2022-06-12 NOTE — Progress Notes (Signed)
Hunter, [name] ([id number]) [id number] Page 1 of 9 Visit Report for 05/11/2022 Chief Complaint Document Details Patient Name: Date of Service: [name]. 05/11/2022 11:15 A M Medical Record Number: [id number] Patient Account Number: [id number] Date of Birth/Sex: Treating RN: [birth year] (72 y.o. M) Primary Care Provider: Jarome [name] Other Clinician: Referring Provider: Treating Provider/Extender: Hunter [name] in Treatment: 5 Information Obtained from: Patient Chief Complaint Patients presents for treatment of an open diabetic ulcer Electronic Signature(s) Signed: 05/11/2022 12:38:48 PM By: Hunter Guess MD FACS Entered By: Hunter [name] on 05/11/2022 12:38:48 -------------------------------------------------------------------------------- Debridement Details Patient Name: Date of Service: [name], [name]. 05/11/2022 11:15 A M Medical Record Number: [id number] Patient Account Number: [id number] Date of Birth/Sex: Treating RN: 1950/10/19 (72 y.o. Hunter [name] Primary Care Provider: Jarome [name] Other Clinician: Referring Provider: Treating Provider/Extender: Hunter [name] in Treatment: 5 Debridement Performed for Assessment: Wound #1 Left T Second oe Performed By: Physician Hunter Guess, MD Debridement Type: Debridement Severity of Tissue Pre Debridement: Fat layer exposed Level of Consciousness (Pre-procedure): Awake and Alert Pre-procedure Verification/Time Out Yes - 11:47 Taken: Start Time: 11:48 Pain Control: Lidocaine 4% T opical Solution Percent of Wound Bed Debrided: 100% T Area Debrided (cm): otal 19.62 Tissue and other material debrided: Non-Viable, Slough, Slough Level: Non-Viable Tissue Debridement Description: Selective/Open Wound Instrument: Curette Bleeding: Minimum Hemostasis Achieved: Pressure End Time: 11:50 Procedural Pain: 0 Post  Procedural Pain: 0 Response to Treatment: Procedure was tolerated well Level of Consciousness (Post- Awake and Alert procedure): Post Debridement Measurements of Total Wound Length: (cm) 5 Width: (cm) 5 Depth: (cm) 0.2 Volume: (cm) 3.927 Character of Wound/Ulcer Post Debridement: Improved Severity of Tissue Post Debridement: Fat layer exposed Post Procedure Diagnosis Same as Pre-procedure Notes Scribed for Dr Hunter [name] by Hunter Grills RN. Hunter [name], Hunter [name] ([id number]) [id number] Page 2 of 9 Electronic Signature(s) Signed: 05/11/2022 12:57:31 PM By: Hunter Guess MD FACS Signed: 06/12/2022 7:49:05 AM By: Hunter [name] Entered By: Hunter [name] on 05/11/2022 11:49:37 -------------------------------------------------------------------------------- HPI Details Patient Name: Date of Service: [name], [name]. 05/11/2022 11:15 A M Medical Record Number: [id number] Patient Account Number: [id number] Date of Birth/Sex: Treating RN: 1950-06-29 (72 y.o. M) Primary Care Provider: Jarome [name] Other Clinician: Referring Provider: Treating Provider/Extender: Hunter [name] in Treatment: 5 History of Present Illness HPI Description: ADMISSION 04/02/2022 This is a 72 year old man with multiple comorbidities including congestive heart failure, chronic ischemic heart disease, COPD, oxygen dependence, diabetes (last hemoglobin A1c 7.2%), morbid obesity, peripheral artery disease, chronic venous insufficiency, and peripheral neuropathy, among others. He routinely follows with podiatry for his diabetic footcare. In February, he was being seen for his regular nail debridement and was noted to have an ulceration on the second digit of his left foot. He had diminished pedal pulses and he was referred for ABI testing. ABIs were noncompressible bilaterally and he had abnormal diminished TBI's of 0. 4 7 on the right and 0.33 on the left.  Based upon these findings, along with a nonhealing ulcer, vascular surgery was consulted. On March 13, 2022, he had an aortogram with left lower extremity runoff. Stents were placed in his left superficial femoral-popliteal artery, left external iliac, and left common iliac artery. Post stenting, he had three-vessel runoff. He saw Dr. Allena [name] after undergoing the stent procedure. As he already had a wound care consultation scheduled, Dr. Allena [name] simply had him continue dressing changes until his visit today.  On the dorsal surface of his left second toe, there is an ulcer with slough and nonviable tendon visible. On further inspection, the joint space of the bone and the end of the proximal phalanx are both visible. 04/10/2022: The biopsy that I took last week showed chronic inflammation, but no osteomyelitis. The wound is cleaner this week with granulation tissue beginning to fill in. There is still a little bit of tendon exposed. 04/20/2022: There is more granulation tissue filling in the wound. Bone remains exposed with some slough accumulation. He saw vascular surgery earlier this week and has improved toe pressures although there is some evidence of stenosis in both his common and external iliac stents as well as stenosis proximal to the stent. They plan to bring him back for short-term interval follow-up to monitor this closely and intervene if necessary. 04/30/2022: The wound is smaller with good granulation tissue. Bone is no longer exposed. There is some slough accumulation. 05/11/2022: The wound continues to contract. Bone is completely covered with good granulation tissue. Minimal slough accumulation. Unfortunately, he was denied for a skin substitute. Electronic Signature(s) Signed: 05/11/2022 12:39:35 PM By: Hunter Guess MD FACS Entered By: Hunter [name] on 05/11/2022 12:39:35 -------------------------------------------------------------------------------- Physical Exam Details Patient  Name: Date of Service: [name], [name]. 05/11/2022 11:15 A M Medical Record Number: [id number] Patient Account Number: [id number] Date of Birth/Sex: Treating RN: 1950-06-03 (72 y.o. M) Primary Care Provider: Jarome [name] Other Clinician: Referring Provider: Treating Provider/Extender: Hunter [name] in Treatment: 5 Constitutional . . . . no acute distress. Respiratory Normal work of breathing on room air. Notes 05/11/2022: The wound continues to contract. Bone is completely covered with good granulation tissue. Minimal slough accumulation. Electronic Signature(s) Signed: 05/11/2022 12:41:01 PM By: Hunter Guess MD FACS Entered By: Hunter [name] on 05/11/2022 12:41:01 Hunter [name] ([id number]) [id number] Page 3 of 9 -------------------------------------------------------------------------------- Physician Orders Details Patient Name: Date of Service: [name]. 05/11/2022 11:15 A M Medical Record Number: [id number] Patient Account Number: [id number] Date of Birth/Sex: Treating RN: [birth year] (72 y.o. Hunter [name] Primary Care Provider: Jarome [name] Other Clinician: Referring Provider: Treating Provider/Extender: Hunter [name] in Treatment: 5 Verbal / Phone Orders: No Diagnosis Coding ICD-10 Coding Code Description [phone] Non-pressure chronic ulcer of other part of left foot with bone involvement without evidence of necrosis E11.621 Type 2 diabetes mellitus with foot ulcer I73.9 Peripheral vascular disease, unspecified I25.9 Chronic ischemic heart disease, unspecified I87.2 Venous insufficiency (chronic) (peripheral) I50.30 Unspecified diastolic (congestive) heart failure I27.20 Pulmonary hypertension, unspecified J44.9 Chronic obstructive pulmonary disease, unspecified Z99.81 Dependence on supplemental oxygen Follow-up Appointments ppointment in 1 week. -  Dr. Lady [name] Room 3 Return A Anesthetic (In clinic) Topical Lidocaine 4% applied to wound bed - Used in Clinic prior to debridement Cellular or Tissue Based Products Wound #1 Left T Second oe Cellular or Tissue Based Product Type: - RUN IVR for Epicord - Not covered by insurance. Bathing/ Shower/ Hygiene May shower and wash wound with soap and water. - May change dressing after bathing Edema Control - Lymphedema / SCD / Other Bilateral Lower Extremities Avoid standing for long periods of time. Patient to wear own compression stockings every day. Other Edema Control Orders/Instructions: - Elevate feet throughout the day Off-Loading Other: - Place pillows under ankles to off-load-especially while in bed Wound Treatment Wound #1 - T Second oe Wound Laterality: Left Cleanser: Soap and Water 1 x Per Day/30 Days Discharge  Instructions: May shower and wash wound with dial antibacterial soap and water prior to dressing change. Cleanser: Wound Cleanser (Generic) 1 x Per Day/30 Days Discharge Instructions: Cleanse the wound with wound cleanser prior to applying a clean dressing using gauze sponges, not tissue or cotton balls. Prim Dressing: Maxorb Extra Calcium Alginate, 2x2 (in/in) (Generic) 1 x Per Day/30 Days ary Discharge Instructions: Apply to wound bed as instructed Secondary Dressing: Woven Gauze Sponges 2x2 in (Generic) 1 x Per Day/30 Days Discharge Instructions: Apply over primary dressing as directed. Secured With: Insurance underwriter, Sterile 2x75 (in/in) (Generic) 1 x Per Day/30 Days Discharge Instructions: Secure with stretch gauze as directed. Secured With: Paper Tape, 2x10 (in/yd) (Generic) 1 x Per Day/30 Days Discharge Instructions: Secure dressing with tape as directed. Secured With: Netting, Tubular #2 (Generic) 1 x Per Day/30 Days Discharge Instructions: apply over dressing Radiology X-ray, foot - Xray, three views of left foot. Evaluate for  fracture. Hunter [name], [name] [id number] Page 4 of 9 Electronic Signature(s) Signed: 05/11/2022 12:57:31 PM By: Hunter Guess MD FACS Entered By: Hunter [name] on 05/11/2022 12:41:23 Prescription 05/11/2022 -------------------------------------------------------------------------------- Hunter [name]. Hunter Guess MD Patient Name: Provider: 04-17-1950 [id number] Date of Birth: NPI#: Judie Petit [id number] Sex: DEA #: [phone] [id number] Phone #: License #: [employer] Signs: Patient Address: [address] Eligha Bridegroom [facility] [place], [place] [address] [address] [place], [place] [address] [phone] Allergies codeine; Glucophage; amitriptyline; Chantix; Glucophage; ranolazine; lisinopril; ramipril; adhesive tape; adhesive tape Provider's Orders X-ray, foot - Xray, three views of left foot. Evaluate for fracture. Hand Signature: Date(s): Electronic Signature(s) Signed: 05/11/2022 12:57:31 PM By: Hunter Guess MD FACS Entered By: Hunter [name] on 05/11/2022 12:41:23 -------------------------------------------------------------------------------- Problem List Details Patient Name: Date of Service: [name]. 05/11/2022 11:15 A M Medical Record Number: [id number] Patient Account Number: [id number] Date of Birth/Sex: Treating RN: November 23, 1950 (72 y.o. Hunter [name] Primary Care Provider: Jarome [name] Other Clinician: Referring Provider: Treating Provider/Extender: Hunter [name] in Treatment: 5 Active Problems ICD-10 Encounter Code Description Active Date MDM Diagnosis [phone] Non-pressure chronic ulcer of other part of left foot with bone involvement 04/02/2022 No Yes without evidence of necrosis E11.621 Type 2 diabetes mellitus with foot ulcer 04/02/2022 No Yes I73.9 Peripheral vascular disease, unspecified 04/02/2022 No Yes I25.9 Chronic ischemic  heart disease, unspecified 04/02/2022 No Yes I87.2 Venous insufficiency (chronic) (peripheral) 04/02/2022 No Yes Hunter [name], Hunter [name] ([id number]) [id number] Page 5 of 9 I50.30 Unspecified diastolic (congestive) heart failure 04/02/2022 No Yes I27.20 Pulmonary hypertension, unspecified 04/02/2022 No Yes J44.9 Chronic obstructive pulmonary disease, unspecified 04/02/2022 No Yes Z99.81 Dependence on supplemental oxygen 04/02/2022 No Yes Inactive Problems Resolved Problems Electronic Signature(s) Signed: 05/11/2022 12:33:35 PM By: Hunter Guess MD FACS Entered By: Hunter [name] on 05/11/2022 12:33:35 -------------------------------------------------------------------------------- Progress Note Details Patient Name: Date of Service: [name], [name]. 05/11/2022 11:15 A M Medical Record Number: [id number] Patient Account Number: [id number] Date of Birth/Sex: Treating RN: 07-09-1950 (72 y.o. M) Primary Care Provider: Jarome [name] Other Clinician: Referring Provider: Treating Provider/Extender: Hunter [name] in Treatment: 5 Subjective Chief Complaint Information obtained from Patient Patients presents for treatment of an open diabetic ulcer History of Present Illness (HPI) ADMISSION 04/02/2022 This is a 72 year old man with multiple comorbidities including congestive heart failure, chronic ischemic heart disease, COPD, oxygen dependence, diabetes (last hemoglobin A1c 7.2%), morbid obesity, peripheral artery disease, chronic venous insufficiency, and peripheral neuropathy, among  others. He routinely follows with podiatry for his diabetic footcare. In February, he was being seen for his regular nail debridement and was noted to have an ulceration on the second digit of his left foot. He had diminished pedal pulses and he was referred for ABI testing. ABIs were noncompressible bilaterally and he had abnormal diminished TBI's of 0. 4 7 on the  right and 0.33 on the left. Based upon these findings, along with a nonhealing ulcer, vascular surgery was consulted. On March 13, 2022, he had an aortogram with left lower extremity runoff. Stents were placed in his left superficial femoral-popliteal artery, left external iliac, and left common iliac artery. Post stenting, he had three-vessel runoff. He saw Dr. Allena [name] after undergoing the stent procedure. As he already had a wound care consultation scheduled, Dr. Allena [name] simply had him continue dressing changes until his visit today. On the dorsal surface of his left second toe, there is an ulcer with slough and nonviable tendon visible. On further inspection, the joint space of the bone and the end of the proximal phalanx are both visible. 04/10/2022: The biopsy that I took last week showed chronic inflammation, but no osteomyelitis. The wound is cleaner this week with granulation tissue beginning to fill in. There is still a little bit of tendon exposed. 04/20/2022: There is more granulation tissue filling in the wound. Bone remains exposed with some slough accumulation. He saw vascular surgery earlier this week and has improved toe pressures although there is some evidence of stenosis in both his common and external iliac stents as well as stenosis proximal to the stent. They plan to bring him back for short-term interval follow-up to monitor this closely and intervene if necessary. 04/30/2022: The wound is smaller with good granulation tissue. Bone is no longer exposed. There is some slough accumulation. 05/11/2022: The wound continues to contract. Bone is completely covered with good granulation tissue. Minimal slough accumulation. Unfortunately, he was denied for a skin substitute. Patient History Social History Former smoker - ended on 01/01/2009, Marital Status - Married, Alcohol Use - Never, Drug Use - No History, Caffeine Use - Never. Medical History Respiratory Patient has history of Chronic  Obstructive Pulmonary Disease (COPD), Sleep Apnea - wears a CPAP Hunter [name], [name] [id number] Page 6 of 9 Cardiovascular Patient has history of Hypertension, Myocardial Infarction Endocrine Patient has history of Type II Diabetes Neurologic Patient has history of Neuropathy Hospitalization/Surgery History - CABG (2005);[name] A Surgical History Notes nd Cardiovascular Carotid Artery Disease Chronic Venous Insufficiency Peripheral arterial occlusive disease Gastrointestinal GERD Genitourinary ED (of organic origin) Urinary Incontinence Musculoskeletal Left Foot - Foot Drop Neurologic Left Foot Drop Objective Constitutional no acute distress. Vitals Time Taken: 11:28 AM, Height: 66 in, Weight: 235 lbs, BMI: 37.9, Temperature: 97.8 F, Pulse: 74 bpm, Respiratory Rate: 18 breaths/min, Blood Pressure: 128/74 mmHg, Capillary Blood Glucose: 117 mg/dl. Respiratory Normal work of breathing on room air. General Notes: 05/11/2022: The wound continues to contract. Bone is completely covered with good granulation tissue. Minimal slough accumulation. Integumentary (Hair, Skin) Wound #1 status is Open. Original cause of wound was Gradually Appeared. The date acquired was: 01/25/2022. The wound has been in treatment 5 weeks. The wound is located on the Left T Second. The wound measures 0.5cm length x 0.5cm width x 0.2cm depth; 0.196cm^2 area and 0.039cm^3 volume. There is oe bone and Fat Layer (Subcutaneous Tissue) exposed. There is a medium amount of serosanguineous drainage noted. The wound margin is epibole. There is medium (  34-66%) red, pink granulation within the wound bed. There is a medium (34-66%) amount of necrotic tissue within the wound bed including Adherent Slough. The periwound skin appearance had no abnormalities noted for texture. The periwound skin appearance had no abnormalities noted for moisture. The periwound skin appearance had no  abnormalities noted for color. Periwound temperature was noted as No Abnormality. Assessment Active Problems ICD-10 Non-pressure chronic ulcer of other part of left foot with bone involvement without evidence of necrosis Type 2 diabetes mellitus with foot ulcer Peripheral vascular disease, unspecified Chronic ischemic heart disease, unspecified Venous insufficiency (chronic) (peripheral) Unspecified diastolic (congestive) heart failure Pulmonary hypertension, unspecified Chronic obstructive pulmonary disease, unspecified Dependence on supplemental oxygen Procedures Wound #1 Pre-procedure diagnosis of Wound #1 is a Diabetic Wound/Ulcer of the Lower Extremity located on the Left T Second .Severity of Tissue Pre Debridement oe is: Fat layer exposed. There was a Selective/Open Wound Non-Viable Tissue Debridement with a total area of 19.62 sq cm performed by Hunter Guess, MD. With the following instrument(s): Curette to remove Non-Viable tissue/material. Material removed includes [facility] after achieving pain control using Lidocaine 4% T opical Solution. No specimens were taken. A time out was conducted at 11:47, prior to the start of the procedure. A Minimum amount of bleeding was controlled with Pressure. The procedure was tolerated well with a pain level of 0 throughout and a pain level of 0 following the procedure. Post Debridement Measurements: 5cm length x 5cm width x 0.2cm depth; 3.927cm^3 volume. Character of Wound/Ulcer Post Debridement is improved. Severity of Tissue Post Debridement is: Fat layer exposed. Post procedure Diagnosis Wound #1: Same as Pre-Procedure General Notes: Scribed for Dr Hunter [name] by Hunter Grills RN.[name] Hunter [name], [name] [id number] Page 7 of 9 Plan Follow-up Appointments: Return Appointment in 1 week. - Dr. Lady [name] Room 3 Anesthetic: (In clinic) Topical Lidocaine 4% applied to wound bed - Used in Clinic prior to  debridement Cellular or Tissue Based Products: Wound #1 Left T Second: oe Cellular or Tissue Based Product Type: - RUN IVR for Epicord - Not covered by insurance. Bathing/ Shower/ Hygiene: May shower and wash wound with soap and water. - May change dressing after bathing Edema Control - Lymphedema / SCD / Other: Avoid standing for long periods of time. Patient to wear own compression stockings every day. Other Edema Control Orders/Instructions: - Elevate feet throughout the day Off-Loading: Other: - Place pillows under ankles to off-load-especially while in bed Radiology ordered were: X-ray, foot - Xray, three views of left foot. Evaluate for fracture. WOUND #1: - T Second Wound Laterality: Left oe Cleanser: Soap and Water 1 x Per Day/30 Days Discharge Instructions: May shower and wash wound with dial antibacterial soap and water prior to dressing change. Cleanser: Wound Cleanser (Generic) 1 x Per Day/30 Days Discharge Instructions: Cleanse the wound with wound cleanser prior to applying a clean dressing using gauze sponges, not tissue or cotton balls. Prim Dressing: Maxorb Extra Calcium Alginate, 2x2 (in/in) (Generic) 1 x Per Day/30 Days ary Discharge Instructions: Apply to wound bed as instructed Secondary Dressing: Woven Gauze Sponges 2x2 in (Generic) 1 x Per Day/30 Days Discharge Instructions: Apply over primary dressing as directed. Secured With: Insurance underwriter, Sterile 2x75 (in/in) (Generic) 1 x Per Day/30 Days Discharge Instructions: Secure with stretch gauze as directed. Secured With: Paper T ape, 2x10 (in/yd) (Generic) 1 x Per Day/30 Days Discharge Instructions: Secure dressing with tape as directed. Secured With: Netting, Tubular #2 (Generic) 1 x Per Day/30 Days  Discharge Instructions: apply over dressing 05/11/2022: The wound continues to contract. Bone is completely covered with good granulation tissue. Minimal slough accumulation. Unfortunately, he  was denied for a skin substitute. I used a curette to debride slough from his wound. We will continue silver alginate to the wound bed. Although a skin substitute would likely accelerate his healing, he is doing well regardless. He did note that the toe seems to be deviating more laterally and wanted to know if it could be broken. We will send him for an x-ray to evaluate this possibility. He will follow-up in 1 week. Electronic Signature(s) Signed: 05/11/2022 12:46:30 PM By: Hunter Guess MD FACS Previous Signature: 05/11/2022 12:44:57 PM Version By: Hunter Guess MD FACS Previous Signature: 05/11/2022 12:43:30 PM Version By: Hunter Guess MD FACS Entered By: Hunter [name] on 05/11/2022 12:46:30 -------------------------------------------------------------------------------- HxROS Details Patient Name: Date of Service: [name], [name]. 05/11/2022 11:15 A M Medical Record Number: [id number] Patient Account Number: [id number] Date of Birth/Sex: Treating RN: 1950/06/16 (72 y.o. M) Primary Care Provider: Jarome [name] Other Clinician: Referring Provider: Treating Provider/Extender: Hunter [name] in Treatment: 5 Respiratory Medical History: Positive for: Chronic Obstructive Pulmonary Disease (COPD); Sleep Apnea - wears a CPAP Cardiovascular Medical History: Positive for: Hypertension; Myocardial Infarction Past Medical History Notes: Carotid Artery Disease Chronic Venous Insufficiency Peripheral arterial occlusive disease Hunter [name], Hunter [name] ([id number]) [id number] Page 8 of 9 Gastrointestinal Medical History: Past Medical History Notes: GERD Endocrine Medical History: Positive for: Type II Diabetes Time with diabetes: 15 years Treated with: Insulin Blood sugar tested every day: Yes Tested : 2 x day Genitourinary Medical History: Past Medical History Notes: ED (of organic origin) Urinary  Incontinence Musculoskeletal Medical History: Past Medical History Notes: Left Foot - Foot Drop Neurologic Medical History: Positive for: Neuropathy Past Medical History Notes: Left Foot Drop Immunizations Pneumococcal Vaccine: Received Pneumococcal Vaccination: No Implantable Devices None Hospitalization / Surgery History Type of Hospitalization/Surgery CABG (2005); Family and Social History Former smoker - ended on 01/01/2009; Marital Status - Married; Alcohol Use: Never; Drug Use: No History; Caffeine Use: Never; Financial Concerns: No; Food, Clothing or Shelter Needs: No; Support System Lacking: No; Transportation Concerns: No Electronic Signature(s) Signed: 05/11/2022 12:57:31 PM By: Hunter Guess MD FACS Entered By: Hunter [name] on 05/11/2022 12:40:35 -------------------------------------------------------------------------------- SuperBill Details Patient Name: Date of Service: [name], [name]. 05/11/2022 Medical Record Number: [id number] Patient Account Number: [id number] Date of Birth/Sex: Treating RN: [birth year] (72 y.o. 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, unspecified Hunter [name], Hunter [name] ([id number]) [id number] Page 9 of 9 I87.2 Venous insufficiency (chronic) (peripheral) I50.30 Unspecified diastolic (congestive) heart failure I27.20 Pulmonary hypertension, unspecified J44.9 Chronic obstructive pulmonary disease, unspecified Z99.81 Dependence on supplemental oxygen Facility Procedures : CPT4  Code: 41324401 Description: 97597 - DEBRIDE WOUND 1ST 20 SQ CM OR < ICD-10 Diagnosis Description L97.526 Non-pressure chronic ulcer of other part of left foot with bone involvement witho Modifier: ut evidence of necr Quantity: 1 osis Physician Procedures : CPT4 Code Description Modifier [id number] - WC PHYS LEVEL 3 - EST PT 25 ICD-10 Diagnosis Description L97.526 Non-pressure chronic ulcer of other part of left foot  with bone involvement without evidence of necro E11.621 Type 2 diabetes mellitus with  foot ulcer I73.9 Peripheral vascular disease, unspecified I50.30 Unspecified diastolic (congestive) heart failure Quantity: 1 sis : [id number] 97597 - WC PHYS DEBR WO ANESTH 20 SQ CM ICD-10 Diagnosis Description L97.526 Non-pressure chronic ulcer of other 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

## 2022-06-12 NOTE — Progress Notes (Signed)
ISSIAC, SEACHRIST ([id number]) [id number] Page 1 of 6 Visit Report for 05/11/2022 Arrival Information Details Patient Name: Date of Service: [name]. 05/11/2022 11:15 A M Medical Record Number: [id number] Patient Account Number: [id number] Date of Birth/Sex: Treating RN: September 29, 1950 (72 y.o. M) Primary Care Iliyana Convey: Jarome Matin Other Clinician: Referring Maggy Wyble: Treating Erline Siddoway/Extender: Marena Chancy in Treatment: 5 Visit Information History Since Last Visit All ordered tests and consults were completed: No Patient Arrived: Wheel Chair Added or deleted any medications: No Arrival Time: 11:28 Any new allergies or adverse reactions: No Accompanied By: wife Had a fall or experienced change in No Transfer Assistance: None activities of daily living that may affect Patient Requires Transmission-Based Precautions: No risk of falls: Patient Has Alerts: Yes Signs or symptoms of abuse/neglect since last visito No Patient Alerts: ABI [name] [place] (03/02/22) Hospitalized since last visit: No ABI L [place] (03/02/22) Implantable device outside of the clinic excluding No Diuretic-Lasix cellular tissue based products placed in the center Plavix since last visit: Pain Present Now: Yes Electronic Signature(s) Signed: 05/11/2022 12:53:09 PM By: Dayton Scrape Entered By: Dayton Scrape on 05/11/2022 11:28:49 -------------------------------------------------------------------------------- Encounter Discharge Information Details Patient Name: Date of Service: [name], [name]. 05/11/2022 11:15 A M Medical Record Number: [id number] Patient Account Number: [id number] Date of Birth/Sex: Treating RN: Jun 28, 1950 (72 y.o. Yates Decamp Primary Care Damonie Ellenwood: Jarome Matin Other Clinician: Referring Mattheo Swindle: Treating Shunsuke Granzow/Extender: Marena Chancy in Treatment: 5 Encounter Discharge Information Items  Post Procedure Vitals Discharge Condition: Stable Temperature (F): 98.2 Ambulatory Status: Wheelchair Pulse (bpm): 84 Discharge Destination: Home Respiratory Rate (breaths/min): 18 Transportation: Private Auto Blood Pressure (mmHg): 138/72 Accompanied By: spouse Schedule Follow-up Appointment: Yes Clinical Summary of Care: Patient Declined Electronic Signature(s) Signed: 06/12/2022 7:49:05 AM By: Brenton Grills Entered By: Brenton Grills on 05/11/2022 12:13:46 -------------------------------------------------------------------------------- Lower Extremity Assessment Details Patient Name: Date of Service: [name], [name]. 05/11/2022 11:15 A M Medical Record Number: [id number] Patient Account Number: [id number] Date of Birth/Sex: Treating RN: December 21, 1950 (72 y.o. Yates Decamp Primary Care Kamya Watling: Jarome Matin Other Clinician: Referring Jaquaya Coyle: Treating Lambros Cerro/Extender: Marena Chancy in Treatment: 5 Edema Assessment Assessed: Hunter [name]: No] Franne Forts: No] [name], [name] [Right: [id number] Page 2 of 6] Edema: [Left: Ye] [Right: s] Calf Left: Right: Point of Measurement: 29 cm From Medial Instep 41 cm Ankle Left: Right: Point of Measurement: 5 cm From Medial Instep 30 cm Vascular Assessment Pulses: Dorsalis Pedis Palpable: [Left:Yes] Electronic Signature(s) Signed: 06/12/2022 7:49:05 AM By: Brenton Grills Entered By: Brenton Grills on 05/11/2022 11:38:30 -------------------------------------------------------------------------------- Multi Wound Chart Details Patient Name: Date of Service: [name], [name]. 05/11/2022 11:15 A M Medical Record Number: [id number] Patient Account Number: [id number] Date of Birth/Sex: Treating RN: December 01, 1950 (72 y.o. M) Primary Care Tilden Broz: Jarome Matin Other Clinician: Referring Sheppard Luckenbach: Treating Derris Millan/Extender: Marena Chancy in Treatment: 5 Vital Signs Height(in): 66 Capillary Blood Glucose(mg/dl): 347 Weight(lbs): 425 Pulse(bpm): 74 Body Mass Index(BMI): 37.9 Blood Pressure(mmHg): 128/74 Temperature(F): 97.8 Respiratory Rate(breaths/min): 18 [1:Photos:] [N/A:N/A] Left T Second oe N/A N/A Wound Location: Gradually Appeared N/A N/A Wounding Event: Diabetic Wound/Ulcer of the Lower N/A N/A Primary Etiology: Extremity Chronic Obstructive Pulmonary N/A N/A Comorbid History: Disease (COPD), Sleep Apnea, Hypertension, Myocardial Infarction, Type II Diabetes, Neuropathy 01/25/2022 N/A N/A Date Acquired: 5 N/A N/A Weeks of Treatment: Open N/A N/A Wound Status: No N/A N/A Wound Recurrence:  0.5x0.5x0.2 N/A N/A Measurements L x W x D (cm) 0.196 N/A N/A A (cm) : rea 0.039 N/A N/A Volume (cm) : 92.00% N/A N/A % Reduction in A rea: 94.70% N/A N/A % Reduction in Volume: Grade 2 N/A N/A Classification: Medium N/A N/A Exudate A mount: Serosanguineous N/A N/A Exudate Type: red, brown N/A N/A Exudate Color: Epibole N/A N/A Wound Margin: Medium (34-66%) N/A N/A Granulation A mount: Red, Pink N/A N/A Granulation Quality: Medium (34-66%) N/A N/A Necrotic A mountCLAIBORNE, [name] [id number] Page 3 of 6 Fat Layer (Subcutaneous Tissue): Yes N/A N/A Exposed Structures: Bone: Yes Fascia: No Tendon: No Muscle: No Joint: No None N/A N/A Epithelialization: Debridement - Selective/Open Wound N/A N/A Debridement: Pre-procedure Verification/Time Out 11:47 N/A N/A Taken: Lidocaine 4% Topical Solution N/A N/A Pain Control: Slough N/A N/A Tissue Debrided: Non-Viable Tissue N/A N/A Level: 19.62 N/A N/A Debridement A (sq cm): rea Curette N/A N/A Instrument: Minimum N/A N/A Bleeding: Pressure N/A N/A Hemostasis A chieved: 0 N/A N/A Procedural Pain: 0 N/A N/A Post Procedural Pain: Procedure was tolerated well N/A N/A Debridement  Treatment Response: 5x5x0.2 N/A N/A Post Debridement Measurements L x W x D (cm) 3.927 N/A N/A Post Debridement Volume: (cm) No Abnormalities Noted N/A N/A Periwound Skin Texture: No Abnormalities Noted N/A N/A Periwound Skin Moisture: No Abnormalities Noted N/A N/A Periwound Skin Color: No Abnormality N/A N/A Temperature: Debridement N/A N/A Procedures Performed: Treatment Notes Wound #1 (Toe Second) Wound Laterality: Left Cleanser Soap and Water Discharge Instruction: May shower and wash wound with dial antibacterial soap and water prior to dressing change. Wound Cleanser Discharge Instruction: Cleanse the wound with wound cleanser prior to applying a clean dressing using gauze sponges, not tissue or cotton balls. Peri-Wound Care Topical Primary Dressing Maxorb Extra Calcium Alginate, 2x2 (in/in) Discharge Instruction: Apply to wound bed as instructed Secondary Dressing Woven Gauze Sponges 2x2 in Discharge Instruction: Apply over primary dressing as directed. Secured With Conforming Stretch Gauze Bandage, Sterile 2x75 (in/in) Discharge Instruction: Secure with stretch gauze as directed. Paper Tape, 2x10 (in/yd) Discharge Instruction: Secure dressing with tape as directed. Netting, Tubular #2 Discharge Instruction: apply over dressing Compression Wrap Compression Stockings Add-Ons Electronic Signature(s) Signed: 05/11/2022 12:34:20 PM By: Duanne Guess MD FACS Entered By: Duanne Guess on 05/11/2022 12:34:20 -------------------------------------------------------------------------------- Multi-Disciplinary Care Plan Details Patient Name: Date of Service: [name], [name]. 05/11/2022 11:15 A M Medical Record Number: [id number] Patient Account Number: [id number] Date of Birth/Sex: Treating RN: 08-25-1950 (72 y.o. Hunter [name], Hunter [name], Hunter [name] ([id number]) [phone].pdf Page 4 of 6 Primary Care Joell Buerger: Jarome Matin Other  Clinician: Referring Ed Rayson: Treating Daevon Holdren/Extender: Marena Chancy in Treatment: 5 Active Inactive Wound/Skin Impairment Nursing Diagnoses: Impaired tissue integrity Goals: Patient/caregiver will verbalize understanding of skin care regimen Date Initiated: 04/02/2022 Target Resolution Date: 07/01/2022 Goal Status: Active Interventions: Assess ulceration(s) every visit Treatment Activities: Skin care regimen initiated : 04/02/2022 Notes: Electronic Signature(s) Signed: 06/12/2022 7:49:05 AM By: Brenton Grills Entered By: Brenton Grills on 05/11/2022 11:40:18 -------------------------------------------------------------------------------- Pain Assessment Details Patient Name: Date of Service: [name], [name]. 05/11/2022 11:15 A M Medical Record Number: [id number] Patient Account Number: [id number] Date of Birth/Sex: Treating RN: September 15, 1950 (72 y.o. M) Primary Care Denielle Bayard: Jarome Matin Other Clinician: Referring Briony Parveen: Treating Stacyann Mcconaughy/Extender: Marena Chancy in Treatment: 5 Active Problems Location of Pain Severity and Description of Pain Patient Has Paino Yes Site Locations Rate the pain. Current Pain Level: 6 Worst  Pain Level: 10 Least Pain Level: 0 Tolerable Pain Level: 2 Pain Management and Medication Current Pain Management: Electronic Signature(s) Signed: 05/11/2022 12:53:09 PM By: Dayton Scrape Entered By: Dayton Scrape on 05/11/2022 11:29:38 [name] [id number] Page 5 of 6 -------------------------------------------------------------------------------- Patient/Caregiver Education Details Patient Name: Date of Service: [name]. 5/10/2024andnbsp11:15 A M Medical Record Number: [id number] Patient Account Number: [id number] Date of Birth/Gender: Treating RN: 13-Feb-1950 (72 y.o. Yates Decamp Primary Care Physician: Jarome Matin  Other Clinician: Referring Physician: Treating Physician/Extender: Marena Chancy in Treatment: 5 Education Assessment Education Provided To: Patient and Caregiver Education Topics Provided Nutrition: Methods: Explain/Verbal Responses: State content correctly Wound/Skin Impairment: Methods: Explain/Verbal Responses: State content correctly Electronic Signature(s) Signed: 06/12/2022 7:49:05 AM By: Brenton Grills Entered By: Brenton Grills on 05/11/2022 11:41:01 -------------------------------------------------------------------------------- Wound Assessment Details Patient Name: Date of Service: [name], [name]. 05/11/2022 11:15 A M Medical Record Number: [id number] Patient Account Number: [id number] Date of Birth/Sex: Treating RN: [birth year] (72 y.o. M) Primary Care Yameli Delamater: Jarome Matin Other Clinician: Referring Martinez Boxx: Treating Ameila Weldon/Extender: Marena Chancy in Treatment: 5 Wound Status Wound Number: 1 Primary Diabetic Wound/Ulcer of the Lower Extremity Etiology: Wound Location: Left T Second oe Wound Open Wounding Event: Gradually Appeared Status: Date Acquired: 01/25/2022 Comorbid Chronic Obstructive Pulmonary Disease (COPD), Sleep Apnea, Weeks Of Treatment: 5 History: Hypertension, Myocardial Infarction, Type II Diabetes, Neuropathy Clustered Wound: No Photos Wound Measurements Length: (cm) 0.5 Width: (cm) 0.5 Depth: (cm) 0.2 Area: (cm) 0.196 Volume: (cm) 0.039 % Reduction in Area: 92% % Reduction in Volume: 94.7% Epithelialization: None Wound Description Hunter [name], Hunter [name] ([id number]) Classification: Grade 2 Wound Margin: Epibole Exudate Amount: Medium Exudate Type: Serosanguineous Exudate Color: red, brown [id number] Page 6 of 6 Foul Odor After Cleansing: No Slough/Fibrino Yes Wound Bed Granulation Amount: Medium (34-66%) Exposed Structure Granulation  Quality: Red, Pink Fascia Exposed: No Necrotic Amount: Medium (34-66%) Fat Layer (Subcutaneous Tissue) Exposed: Yes Necrotic Quality: Adherent Slough Tendon Exposed: No Muscle Exposed: No Joint Exposed: No Bone Exposed: Yes Periwound Skin Texture Texture Color No Abnormalities Noted: Yes No Abnormalities Noted: Yes Moisture Temperature / Pain No Abnormalities Noted: Yes Temperature: No Abnormality Electronic Signature(s) Signed: 05/11/2022 12:53:09 PM By: Dayton Scrape Entered By: Dayton Scrape on 05/11/2022 11:34:38 -------------------------------------------------------------------------------- Vitals Details Patient Name: Date of Service: [name], [name]. 05/11/2022 11:15 A M Medical Record Number: [id number] Patient Account Number: [id number] Date of Birth/Sex: Treating RN: 18-May-1950 (72 y.o. M) Primary Care Balen Woolum: Jarome Matin Other Clinician: Referring Lashaya Kienitz: Treating Gradie Butrick/Extender: Marena Chancy in Treatment: 5 Vital Signs Time Taken: 11:28 Temperature (F): 97.8 Height (in): 66 Pulse (bpm): 74 Weight (lbs): 235 Respiratory Rate (breaths/min): 18 Body Mass Index (BMI): 37.9 Blood Pressure (mmHg): 128/74 Capillary Blood Glucose (mg/dl): 244 Reference Range: 80 - 120 mg / dl Electronic Signature(s) Signed: 05/11/2022 12:53:09 PM By: Dayton Scrape Entered By: Dayton Scrape on 05/11/2022 11:29:14

## 2022-06-13 ENCOUNTER — Other Ambulatory Visit (HOSPITAL_COMMUNITY): Payer: Self-pay

## 2022-06-14 ENCOUNTER — Other Ambulatory Visit (HOSPITAL_COMMUNITY): Payer: Self-pay

## 2022-06-15 ENCOUNTER — Other Ambulatory Visit (HOSPITAL_COMMUNITY): Payer: Self-pay

## 2022-06-18 ENCOUNTER — Other Ambulatory Visit (HOSPITAL_COMMUNITY): Payer: Self-pay

## 2022-06-19 ENCOUNTER — Other Ambulatory Visit (HOSPITAL_COMMUNITY): Payer: Self-pay

## 2022-06-19 ENCOUNTER — Other Ambulatory Visit: Payer: Self-pay

## 2022-06-20 ENCOUNTER — Other Ambulatory Visit (HOSPITAL_COMMUNITY): Payer: Self-pay

## 2022-06-20 MED ORDER — TRAMADOL HCL 50 MG PO TABS
50.0000 mg | ORAL_TABLET | Freq: Three times a day (TID) | ORAL | 0 refills | Status: DC | PRN
  Filled 2022-06-20: qty 90, 30d supply, fill #0

## 2022-06-25 ENCOUNTER — Encounter (HOSPITAL_BASED_OUTPATIENT_CLINIC_OR_DEPARTMENT_OTHER): Payer: Medicare Other | Admitting: General Surgery

## 2022-06-25 DIAGNOSIS — L97526 Non-pressure chronic ulcer of other part of left foot with bone involvement without evidence of necrosis: Secondary | ICD-10-CM | POA: Diagnosis not present

## 2022-06-25 NOTE — Progress Notes (Signed)
BUCKY, GRIGG ([id number]) [id number] Page 1 of 7 Visit Report for 06/25/2022 Arrival Information Details Patient Name: Date of Service: [name] RO LD R. 06/25/2022 9:30 A M Medical Record Number: [id number] Patient Account Number: [id number] Date of Birth/Sex: Treating RN: 07/11/1950 (72 y.o. Hunter [name] Primary Care Prateek Knipple: Jarome Matin Other Clinician: Referring Kiaria Quinnell: Treating Elmo Rio/Extender: Marena Chancy in Treatment: 12 Visit Information History Since Last Visit Added or deleted any medications: No Patient Arrived: Wheel Chair Any new allergies or adverse reactions: No Arrival Time: 09:33 Had a fall or experienced change in No Accompanied By: spouse activities of daily living that may affect Transfer Assistance: None risk of falls: Patient Identification Verified: Yes Signs or symptoms of abuse/neglect since last visito No Patient Requires Transmission-Based Precautions: No Hospitalized since last visit: No Patient Has Alerts: Yes Implantable device outside of the clinic excluding No Patient Alerts: ABI R [place] (03/02/22) cellular tissue based products placed in the center ABI L [place] (03/02/22) since last visit: Diuretic-Lasix Has Dressing in Place as Prescribed: Yes Plavix Pain Present Now: No Electronic Signature(s) Signed: 06/25/2022 11:06:51 AM By: Karie Schwalbe RN Entered By: Karie Schwalbe on 06/25/2022 09:43:56 -------------------------------------------------------------------------------- Encounter Discharge Information Details Patient Name: Date of Service: [name], [name] 06/25/2022 9:30 A M Medical Record Number: [id number] Patient Account Number: [id number] Date of Birth/Sex: Treating RN: Jul 02, 1950 (72 y.o. Hunter [name] Primary Care Daire Okimoto: Jarome Matin Other Clinician: Referring Tadarrius Burch: Treating Ziyan Schoon/Extender: Marena Chancy in  Treatment: 12 Encounter Discharge Information Items Post Procedure Vitals Discharge Condition: Stable Temperature (F): 98 Ambulatory Status: Wheelchair Pulse (bpm): 69 Discharge Destination: Home Respiratory Rate (breaths/min): 18 Transportation: Private Auto Blood Pressure (mmHg): 152/78 Accompanied By: spouse Schedule Follow-up Appointment: Yes Clinical Summary of Care: Patient Declined Electronic Signature(s) Signed: 06/25/2022 11:06:51 AM By: Karie Schwalbe RN Entered By: Karie Schwalbe on 06/25/2022 11:06:24 [name] ([id number]) [id number] Page 2 of 7 -------------------------------------------------------------------------------- Lower Extremity Assessment Details Patient Name: Date of Service: [name] RO LD R. 06/25/2022 9:30 A M Medical Record Number: [id number] Patient Account Number: [id number] Date of Birth/Sex: Treating RN: 1950-03-13 (72 y.o. Hunter [name] Primary Care Ronnie Doo: Jarome Matin Other Clinician: Referring Monserrath Junio: Treating Rashun Grattan/Extender: Marena Chancy in Treatment: 12 Edema Assessment Assessed: Kyra Searles: No] Franne Forts: No] Edema: [Left: Ye] [Right: s] Calf Left: Right: Point of Measurement: 29 cm From Medial Instep 39.8 cm Ankle Left: Right: Point of Measurement: 5 cm From Medial Instep 29.4 cm Vascular Assessment Pulses: Dorsalis Pedis Palpable: [Left:Yes] Electronic Signature(s) Signed: 06/25/2022 11:06:51 AM By: Karie Schwalbe RN Entered By: Karie Schwalbe on 06/25/2022 09:44:50 -------------------------------------------------------------------------------- Multi Wound Chart Details Patient Name: Date of Service: [name], [name] 06/25/2022 9:30 A M Medical Record Number: [id number] Patient Account Number: [id number] Date of Birth/Sex: Treating RN: 1950/03/21 (72 y.o. 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: Extremity Chronic Obstructive Pulmonary N/A N/A Comorbid History: Disease (COPD), Sleep Apnea, Hypertension, Myocardial Infarction, Type II Diabetes, Neuropathy 01/25/2022 N/A N/A Date Acquired: 12 N/A N/A Weeks of Treatment: Open N/A N/A Wound Status: No N/A N/A  Wound Recurrence: 0.3x0.3x0.1 N/A N/A Measurements L x W x D (cm) 0.071 N/A N/A A (cm) : rea 0.007 N/A N/A Volume (cm) : 97.10% N/A N/A % Reduction in A rea: 99.00% N/A N/A % Reduction in Volume: Grade 2 N/A N/A Classification: Medium N/A N/A Exudate A mount: Serosanguineous N/A N/A Exudate Type: red, brown N/A N/A Exudate Color: Epibole N/A N/A Wound Margin: Large (67-100%) N/A N/A Granulation A mount: Red, Pink N/A N/A Granulation Quality: Small (1-33%) N/A N/A Necrotic A mount: Fat Layer (Subcutaneous Tissue): Yes N/A N/A Exposed Structures: Bone: Yes Fascia: No Tendon: No Muscle: No Joint: No Small (1-33%) N/A N/A Epithelialization: Debridement - Selective/Open Wound N/A N/A Debridement: Pre-procedure Verification/Time Out 09:52 N/A N/A Taken: Lidocaine 5% topical ointment N/A N/A Pain Control: Slough N/A N/A Tissue Debrided: Non-Viable Tissue N/A N/A Level: 0.07 N/A N/A Debridement A (sq cm): rea Curette N/A N/A Instrument: Minimum N/A N/A Bleeding: Pressure N/A N/A Hemostasis A chieved: 0 N/A N/A Procedural Pain: 0 N/A N/A Post Procedural Pain: Procedure was tolerated well N/A N/A Debridement Treatment Response: 0.3x0.3x0.1 N/A N/A Post  Debridement Measurements L x W x D (cm) 0.007 N/A N/A Post Debridement Volume: (cm) No Abnormalities Noted N/A N/A Periwound Skin Texture: No Abnormalities Noted N/A N/A Periwound Skin Moisture: No Abnormalities Noted N/A N/A Periwound Skin Color: No Abnormality N/A N/A Temperature: Debridement N/A N/A Procedures Performed: Treatment Notes Electronic Signature(s) Signed: 06/25/2022 9:56:01 AM By: Duanne Guess MD FACS Entered By: Duanne Guess on 06/25/2022 09:56:01 -------------------------------------------------------------------------------- Multi-Disciplinary Care Plan Details Patient Name: Date of Service: [name], [name] 06/25/2022 9:30 A M Medical Record Number: [id number] Patient Account Number: [id number] Date of Birth/Sex: Treating RN: 1950/11/15 (72 y.o. Hunter [name] Primary Care Benjimen Kelley: Jarome Matin Other Clinician: Referring Craigory Toste: Treating Sion Reinders/Extender: Marena Chancy in Treatment: [address] BENTZION, Hunter ([id number]) [id number] Page 4 of 7 Wound/Skin Impairment Nursing Diagnoses: Impaired tissue integrity Goals: Patient/caregiver will verbalize understanding of skin care regimen Date Initiated: 04/02/2022 Target Resolution Date: 10/01/2022 Goal Status: Active Interventions: Assess ulceration(s) every visit Treatment Activities: Skin care regimen initiated : 04/02/2022 Notes: Electronic Signature(s) Signed: 06/25/2022 11:06:51 AM By: Karie Schwalbe RN Entered By: Karie Schwalbe on 06/25/2022 11:05:05 -------------------------------------------------------------------------------- Pain Assessment Details Patient Name: Date of Service: Hunter [name], [name] 06/25/2022 9:30 A M Medical Record Number: [id number] Patient Account Number: [id number] Date of Birth/Sex: Treating RN: 1950/04/19 (72 y.o. Hunter [name] Primary Care Domique Reardon: Jarome Matin Other  Clinician: Referring Melany Wiesman: Treating Simara Rhyner/Extender: Marena Chancy in Treatment: 12 Active Problems Location of Pain Severity and Description of Pain Patient Has Paino No Site Locations Pain Management and Medication Current Pain Management: Electronic Signature(s) Signed: 06/25/2022 11:06:51 AM By: Karie Schwalbe RN Entered By: Karie Schwalbe on 06/25/2022 09:44:32 [name] ([id number]) [id number] Page 5 of 7 -------------------------------------------------------------------------------- Patient/Caregiver Education Details Patient Name: Date of Service: [name] RO LD R. 6/24/2024andnbsp9:30 A M Medical Record Number: [id number] Patient Account Number: [id number] Date of Birth/Gender: Treating RN: Dec 20, 1950 (72 y.o. Hunter [name] Primary Care Physician: Jarome Matin Other Clinician: Referring Physician: Treating Physician/Extender: Marena Chancy in Treatment: 12 Education Assessment Education Provided To: Patient Education Topics Provided Wound/Skin Impairment: Methods: Explain/Verbal Responses: Return demonstration correctly Electronic Signature(s) Signed: 06/25/2022 11:06:51 AM By: Karie Schwalbe RN Entered By: Karie Schwalbe on 06/25/2022 11:05:19 -------------------------------------------------------------------------------- Wound Assessment Details Patient Name: Date of Service: HA RRELSO N, HA RO LD  R. 06/25/2022 9:30 A M Medical Record Number: [id number] Patient Account Number: [id number] Date of Birth/Sex: Treating RN: 1950/01/03 (72 y.o. Hunter [name] Primary Care Koni Kannan: Jarome Matin Other Clinician: Referring Michaeljoseph Revolorio: Treating Deysy Schabel/Extender: Marena Chancy in Treatment: 12 Wound Status Wound Number: 1 Primary Diabetic Wound/Ulcer of the Lower Extremity Etiology: Wound Location: Left T Second oe Wound  Open Wounding Event: Gradually Appeared Status: Date Acquired: 01/25/2022 Comorbid Chronic Obstructive Pulmonary Disease (COPD), Sleep Apnea, Weeks Of Treatment: 12 History: Hypertension, Myocardial Infarction, Type II Diabetes, Neuropathy Clustered Wound: No Photos Wound Measurements Length: (cm) 0. Width: (cm) 0. Depth: (cm) 0. Area: (cm) 0 Volume: (cm) 0 Cowger, Grantham R ([id number]) 3 % Reduction in Area: 97.1% 3 % Reduction in Volume: 99% 1 Epithelialization: Small (1-33%) .071 Tunneling: No .007 Undermining: No [id number] Page 6 of 7 Wound Description Classification: Grade 2 Wound Margin: Epibole Exudate Amount: Medium Exudate Type: Serosanguineous Exudate Color: red, brown Foul Odor After Cleansing: No Slough/Fibrino Yes Wound Bed Granulation Amount: Large (67-100%) Exposed Structure Granulation Quality: Red, Pink Fascia Exposed: No Necrotic Amount: Small (1-33%) Fat Layer (Subcutaneous Tissue) Exposed: Yes Necrotic Quality: Adherent Slough Tendon Exposed: No Muscle Exposed: No Joint Exposed: No Bone Exposed: Yes Periwound Skin Texture Texture Color No Abnormalities Noted: Yes No Abnormalities Noted: Yes Moisture Temperature / Pain No Abnormalities Noted: Yes Temperature: No Abnormality Treatment Notes Wound #1 (Toe Second) Wound Laterality: Left Cleanser Soap and Water Discharge Instruction: May shower and wash wound with dial antibacterial soap and water prior to dressing change. Wound Cleanser Discharge Instruction: Cleanse the wound with wound cleanser prior to applying a clean dressing using gauze sponges, not tissue or cotton balls. Peri-Wound Care Topical Primary Dressing Maxorb Extra Ag+ Alginate Dressing, 2x2 (in/in) Discharge Instruction: Apply to wound bed as instructed Optifoam Non-Adhesive Dressing, 4x4 in Discharge Instruction: Apply to wound bed as instructed Secondary Dressing Woven Gauze Sponges 2x2  in Discharge Instruction: Apply over primary dressing as directed. Secured With Conforming Stretch Gauze Bandage, Sterile 2x75 (in/in) Discharge Instruction: Secure with stretch gauze as directed. Paper Tape, 2x10 (in/yd) Discharge Instruction: Secure dressing with tape as directed. Netting, Tubular #2 Discharge Instruction: apply over dressing Compression Wrap Compression Stockings Add-Ons Electronic Signature(s) Signed: 06/25/2022 11:06:51 AM By: Karie Schwalbe RN Entered By: Karie Schwalbe on 06/25/2022 09:47:13 [name] ([id number]) [id number] Page 7 of 7 -------------------------------------------------------------------------------- Vitals Details Patient Name: Date of Service: [name] RO LD R. 06/25/2022 9:30 A M Medical Record Number: [id number] Patient Account Number: [id number] Date of Birth/Sex: Treating RN: August 17, 1950 (72 y.o. Hunter [name] Primary Care Jessee Newnam: Jarome Matin Other Clinician: Referring Tibor Lemmons: Treating Malesha Suliman/Extender: Marena Chancy in Treatment: 12 Vital Signs Time Taken: 09:38 Temperature (F): 98 Height (in): 66 Pulse (bpm): 69 Weight (lbs): 235 Respiratory Rate (breaths/min): 18 Body Mass Index (BMI): 37.9 Blood Pressure (mmHg): 152/78 Reference Range: 80 - 120 mg / dl Electronic Signature(s) Signed: 06/25/2022 11:06:51 AM By: Karie Schwalbe RN Entered By: Karie Schwalbe on 06/25/2022 09:44:26

## 2022-06-25 NOTE — Progress Notes (Signed)
Hunter [name], Hunter [name] ([id number]) [id number] Page 1 of 9 Visit Report for 06/25/2022 Chief Complaint Document Details Patient Name: Date of Service: Hunter Hunter [name] Hunter LD R. 06/25/2022 9:30 A M Medical Record Number: [id number] Patient Account Number: [id number] Date of Birth/Sex: Treating RN: [birth year] (72 y.o. M) Primary Care Provider: Jarome [name] Other Clinician: Referring Provider: Treating Provider/Extender: Hunter [name] in Treatment: 12 Information Obtained from: Patient Chief Complaint Patients presents for treatment of an open diabetic ulcer Electronic Signature(s) Signed: 06/25/2022 9:56:07 AM By: Hunter Guess MD FACS Entered By: Hunter [name] on 06/25/2022 09:56:07 -------------------------------------------------------------------------------- Debridement Details Patient Name: Date of Service: Hunter [name], Hunter Hunter LD R. 06/25/2022 9:30 A M Medical Record Number: [id number] Patient Account Number: [id number] Date of Birth/Sex: Treating RN: 03-09-1950 (72 y.o. [name] Primary Care Provider: Jarome [name] Other Clinician: Referring Provider: Treating Provider/Extender: Hunter [name] in Treatment: 12 Debridement Performed for Assessment: Wound #1 Left T Second oe Performed By: Physician Hunter Guess, MD Debridement Type: Debridement Severity of Tissue Pre Debridement: Fat layer exposed Level of Consciousness (Pre-procedure): Awake and Alert Pre-procedure Verification/Time Out Yes - 09:52 Taken: Start Time: 09:52 Pain Control: Lidocaine 5% topical ointment Percent of Wound Bed Debrided: 100% T Area Debrided (cm): otal 0.07 Tissue and other material debrided: Non-Viable, Slough, Slough Level: Non-Viable Tissue Debridement Description: Selective/Open Wound Instrument: Curette Bleeding: Minimum Hemostasis Achieved: Pressure End Time: 09:54 Procedural Pain: 0 Post  Procedural Pain: 0 Response to Treatment: Procedure was tolerated well Level of Consciousness (Post- Awake and Alert procedure): Post Debridement Measurements of Total Wound Length: (cm) 0.3 Width: (cm) 0.3 Depth: (cm) 0.1 Volume: (cm) 0.007 Character of Wound/Ulcer Post Debridement: Improved Hunter, Hunter [name] ([id number]) [id number] Page 2 of 9 Severity of Tissue Post Debridement: Fat layer exposed Post Procedure Diagnosis Same as Pre-procedure Notes Scribed for Hunter [name] by Hunter [name] Electronic Signature(s) Signed: 06/25/2022 10:21:53 AM By: Hunter Guess MD FACS Signed: 06/25/2022 11:06:51 AM By: Hunter Schwalbe RN Entered By: Hunter [name] on 06/25/2022 09:55:44 -------------------------------------------------------------------------------- HPI Details Patient Name: Date of Service: Hunter [name], Hunter Hunter LD R. 06/25/2022 9:30 A M Medical Record Number: [id number] Patient Account Number: [id number] Date of Birth/Sex: Treating RN: 1950-11-16 (72 y.o. M) Primary Care Provider: Jarome [name] Other Clinician: Referring Provider: Treating Provider/Extender: Hunter [name] in Treatment: 12 History of Present Illness HPI Description: ADMISSION 04/02/2022 This is a 72 year old man with multiple comorbidities including congestive heart failure, chronic ischemic heart disease, COPD, oxygen dependence, diabetes (last hemoglobin A1c 7.2%), morbid obesity, peripheral artery disease, chronic venous insufficiency, and peripheral neuropathy, among others. He routinely follows with podiatry for his diabetic footcare. In February, he was being seen for his regular nail debridement and was noted to have an ulceration on the second digit of his left foot. He had diminished pedal pulses and he was referred for ABI testing. ABIs were noncompressible bilaterally and he had abnormal diminished TBI's of 0. 4 7 on the right and 0.33 on the left.  Based upon these findings, along with a nonhealing ulcer, vascular surgery was consulted. On March 13, 2022, he had an aortogram with left lower extremity runoff. Stents were placed in his left superficial femoral-popliteal artery, left external iliac, and left common iliac artery. Post stenting, he had three-vessel runoff. He saw Hunter [name] after undergoing the stent procedure. As he already had a wound care consultation scheduled, Hunter [name] simply had him continue dressing changes until his visit today. On the  dorsal surface of his left second toe, there is an ulcer with slough and nonviable tendon visible. On further inspection, the joint space of the bone and the end of the proximal phalanx are both visible. 04/10/2022: The biopsy that I took last week showed chronic inflammation, but no osteomyelitis. The wound is cleaner this week with granulation tissue beginning to fill in. There is still a little bit of tendon exposed. 04/20/2022: There is more granulation tissue filling in the wound. Bone remains exposed with some slough accumulation. He saw vascular surgery earlier this week and has improved toe pressures although there is some evidence of stenosis in both his common and external iliac stents as well as stenosis proximal to the stent. They plan to bring him back for short-term interval follow-up to monitor this closely and intervene if necessary. 04/30/2022: The wound is smaller with good granulation tissue. Bone is no longer exposed. There is some slough accumulation. 05/11/2022: The wound continues to contract. Bone is completely covered with good granulation tissue. Minimal slough accumulation. Unfortunately, he was denied for a skin substitute. 05/25/2022: The wound is smaller again today with good granulation tissue on the surface. The x-ray that I had done after his last visit unfortunately did show some evidence of acute osteomyelitis, despite the negative biopsy in April. He is leaning  towards going ahead with amputation just to be done with this process. 06/08/2022: There has been a rather dramatic turnaround in his wound. It has filled with good granulation tissue and much of it has epithelialized. He has decided he wants to hold off on amputation for now. 06/25/2022: His wound continues to improve quite remarkably. He has completed his oral antibiotics. The wound is now quite superficial with just a small remaining open area of a couple of millimeters. Electronic Signature(s) Signed: 06/25/2022 9:56:55 AM By: Hunter Guess MD FACS Entered By: Hunter [name] on 06/25/2022 09:56:55 [name] ([id number]) [id number] Page 3 of 9 -------------------------------------------------------------------------------- Physical Exam Details Patient Name: Date of Service: Hunter Hunter [name] Hunter LD R. 06/25/2022 9:30 A M Medical Record Number: [id number] Patient Account Number: [id number] Date of Birth/Sex: Treating RN: January 09, 1950 (72 y.o. M) Primary Care Provider: Jarome [name] Other Clinician: Referring Provider: Treating Provider/Extender: Hunter [name] in Treatment: 12 Constitutional Hypertensive, asymptomatic. . . . no acute distress. Respiratory Normal work of breathing on room air. Notes 06/25/2022: His wound continues to improve quite remarkably. The wound is now quite superficial with just a small remaining open area of a couple of millimeters. Electronic Signature(s) Signed: 06/25/2022 9:58:15 AM By: Hunter Guess MD FACS Entered By: Hunter [name] on 06/25/2022 09:58:15 -------------------------------------------------------------------------------- Physician Orders Details Patient Name: Date of Service: Hunter Hunter [name] Hunter LD R. 06/25/2022 9:30 A M Medical Record Number: [id number] Patient Account Number: [id number] Date of Birth/Sex: Treating RN: 1950/02/28 (72 y.o. [name] Primary Care  Provider: Jarome [name] Other Clinician: Referring Provider: Treating Provider/Extender: Hunter [name] in Treatment: 12 Verbal / Phone Orders: No Diagnosis Coding ICD-10 Coding Code Description L97.526 Non-pressure chronic ulcer of other part of left foot with bone involvement without evidence of necrosis E11.621 Type 2 diabetes mellitus with foot ulcer I73.9 Peripheral vascular disease, unspecified I25.9 Chronic ischemic heart disease, unspecified I87.2 Venous insufficiency (chronic) (peripheral) I50.30 Unspecified diastolic (congestive) heart failure I27.20 Pulmonary hypertension, unspecified J44.9 Chronic obstructive pulmonary disease, unspecified Z99.81 Dependence on supplemental oxygen Follow-up Appointments ppointment in 2 weeks. - Hunter [name] Return A Anesthetic (In clinic) Topical  Lidocaine 4% applied to wound bed - Used in Clinic prior to debridement Bathing/ Shower/ Hygiene May shower and wash wound with soap and water. - May change dressing after bathing Edema Control - Lymphedema / SCD / Other Bilateral [address] JERRION, TABBERT ([id number]) [id number] Page 4 of 9 Avoid standing for long periods of time. Patient to wear own compression stockings every day. Other Edema Control Orders/Instructions: - Elevate feet throughout the day Off-Loading Other: - Place pillows under ankles to off-load-especially while in bed. Use Wheelchair as tolerated. Wound Treatment Wound #1 - T Second oe Wound Laterality: Left Cleanser: Soap and Water 1 x Per Day/30 Days Discharge Instructions: May shower and wash wound with dial antibacterial soap and water prior to dressing change. Cleanser: Wound Cleanser (Generic) 1 x Per Day/30 Days Discharge Instructions: Cleanse the wound with wound cleanser prior to applying a clean dressing using gauze sponges, not tissue or cotton balls. Prim Dressing: Maxorb Extra Ag+ Alginate  Dressing, 2x2 (in/in) 1 x Per Day/30 Days ary Discharge Instructions: Apply to wound bed as instructed Prim Dressing: Optifoam Non-Adhesive Dressing, 4x4 in 1 x Per Day/30 Days ary Discharge Instructions: Apply to wound bed as instructed Secondary Dressing: Woven Gauze Sponges 2x2 in (Generic) 1 x Per Day/30 Days Discharge Instructions: Apply over primary dressing as directed. Secured With: Insurance underwriter, Sterile 2x75 (in/in) (Generic) 1 x Per Day/30 Days Discharge Instructions: Secure with stretch gauze as directed. Secured With: Paper Tape, 2x10 (in/yd) (Generic) 1 x Per Day/30 Days Discharge Instructions: Secure dressing with tape as directed. Secured With: Netting, Tubular #2 (Generic) 1 x Per Day/30 Days Discharge Instructions: apply over dressing Electronic Signature(s) Signed: 06/25/2022 10:21:53 AM By: Hunter Guess MD FACS Signed: 06/25/2022 11:06:51 AM By: Hunter Schwalbe RN Entered By: Hunter [name] on 06/25/2022 10:00:26 -------------------------------------------------------------------------------- Problem List Details Patient Name: Date of Service: Hunter [name], Hunter Hunter LD R. 06/25/2022 9:30 A M Medical Record Number: [id number] Patient Account Number: [id number] Date of Birth/Sex: Treating RN: 20-Oct-1950 (72 y.o. 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 04/02/2022 No Yes without evidence of necrosis E11.621 Type 2 diabetes mellitus with foot ulcer 04/02/2022 No Yes I73.9 Peripheral vascular disease, unspecified 04/02/2022 No Yes I25.9 Chronic ischemic heart disease, unspecified 04/02/2022 No Yes JERICK, KHACHATRYAN ([id number]) [id number] Page 5 of  9 I87.2 Venous insufficiency (chronic) (peripheral) 04/02/2022 No Yes I50.30 Unspecified diastolic (congestive) heart failure 04/02/2022 No Yes I27.20 Pulmonary hypertension, unspecified 04/02/2022 No Yes J44.9 Chronic obstructive pulmonary disease, unspecified 04/02/2022 No Yes Z99.81 Dependence on supplemental oxygen 04/02/2022 No Yes Inactive Problems Resolved Problems Electronic Signature(s) Signed: 06/25/2022 9:55:53 AM By: Hunter Guess MD FACS Entered By: Hunter [name] on 06/25/2022 09:55:53 -------------------------------------------------------------------------------- Progress Note Details Patient Name: Date of Service: Hunter [name], Hunter Hunter LD R. 06/25/2022 9:30 A M Medical Record Number: [id number] Patient Account Number: [id number] Date of Birth/Sex: Treating RN: 1950-12-30 (72 y.o. M) Primary Care Provider: Jarome [name] Other Clinician: Referring Provider: Treating Provider/Extender: Hunter [name] in Treatment: 12 Subjective Chief Complaint Information obtained from Patient Patients presents for treatment of an open diabetic ulcer History of Present Illness (HPI) ADMISSION 04/02/2022 This is a 72 year old man with multiple comorbidities including congestive heart failure, chronic ischemic heart disease, COPD, oxygen dependence, diabetes (last hemoglobin A1c  7.2%), morbid obesity, peripheral artery disease, chronic venous insufficiency, and peripheral neuropathy, among others. He routinely follows with podiatry for his diabetic footcare. In February, he was being seen for his regular nail debridement and was noted to have an ulceration on the second digit of his left foot. He had diminished pedal pulses and he was referred for ABI testing. ABIs were noncompressible bilaterally and he had abnormal diminished TBI's of 0. 4 7 on the right and 0.33 on the left. Based upon these findings, along with a nonhealing ulcer, vascular surgery was consulted.  On March 13, 2022, he had an aortogram with left lower extremity runoff. Stents were placed in his left superficial femoral-popliteal artery, left external iliac, and left common iliac artery. Post stenting, he had three-vessel runoff. He saw Hunter [name] after undergoing the stent procedure. As he already had a wound care consultation scheduled, Hunter [name] simply had him continue dressing changes until his visit today. On the dorsal surface of his left second toe, there is an ulcer with slough and nonviable tendon visible. On further inspection, the joint space of the bone and the end of the proximal phalanx are both visible. 04/10/2022: The biopsy that I took last week showed chronic inflammation, but no osteomyelitis. The wound is cleaner this week with granulation tissue beginning to fill in. There is still a little bit of tendon exposed. 04/20/2022: There is more granulation tissue filling in the wound. Bone remains exposed with some slough accumulation. He saw vascular surgery earlier this week and has improved toe pressures although there is some evidence of stenosis in both his common and external iliac stents as well as stenosis proximal to the stent. They plan to bring him back for short-term interval follow-up to monitor this closely and intervene if necessary. 04/30/2022: The wound is smaller with good granulation tissue. Bone is no longer exposed. There is some slough accumulation. Hunter [name], Hunter [name] ([id number]) [id number] Page 6 of 9 05/11/2022: The wound continues to contract. Bone is completely covered with good granulation tissue. Minimal slough accumulation. Unfortunately, he was denied for a skin substitute. 05/25/2022: The wound is smaller again today with good granulation tissue on the surface. The x-ray that I had done after his last visit unfortunately did show some evidence of acute osteomyelitis, despite the negative biopsy in April. He is leaning towards  going ahead with amputation just to be done with this process. 06/08/2022: There has been a rather dramatic turnaround in his wound. It has filled with good granulation tissue and much of it has epithelialized. He has decided he wants to hold off on amputation for now. 06/25/2022: His wound continues to improve quite remarkably. He has completed his oral antibiotics. The wound is now quite superficial with just a small remaining open area of a couple of millimeters. Patient History Social History Former smoker - ended on 01/01/2009, Marital Status - Married, Alcohol Use - Never, Drug Use - No History, Caffeine Use - Never. Medical History Respiratory Patient has history of Chronic Obstructive Pulmonary Disease (COPD), Sleep Apnea - wears a CPAP Cardiovascular Patient has history of Hypertension, Myocardial Infarction Endocrine Patient has history of Type II Diabetes Neurologic Patient has history of Neuropathy Hospitalization/Surgery History - CABG (2005);[name] Surgical History Notes nd Cardiovascular Carotid Artery Disease Chronic Venous Insufficiency Peripheral arterial occlusive disease Gastrointestinal GERD Genitourinary ED (of organic origin) Urinary Incontinence Musculoskeletal Left Foot - Foot Drop Neurologic Left Foot Drop Objective Constitutional Hypertensive, asymptomatic. no acute distress. Vitals Time Taken:  9:38 AM, Height: 66 in, Weight: 235 lbs, BMI: 37.9, Temperature: 98 F, Pulse: 69 bpm, Respiratory Rate: 18 breaths/min, Blood Pressure: 152/78 mmHg. Respiratory Normal work of breathing on room air. General Notes: 06/25/2022: His wound continues to improve quite remarkably. The wound is now quite superficial with just a small remaining open area of a couple of millimeters. Integumentary (Hair, Skin) Wound #1 status is Open. Original cause of wound was Gradually Appeared. The date acquired was: 01/25/2022. The wound has been in treatment 12 weeks. The wound is  located on the Left T Second. The wound measures 0.3cm length x 0.3cm width x 0.1cm depth; 0.071cm^2 area and 0.007cm^3 volume. There oe is bone and Fat Layer (Subcutaneous Tissue) exposed. There is no tunneling or undermining noted. There is a medium amount of serosanguineous drainage noted. The wound margin is epibole. There is large (67-100%) red, pink granulation within the wound bed. There is a small (1-33%) amount of necrotic tissue within the wound bed including Adherent Slough. The periwound skin appearance had no abnormalities noted for texture. The periwound skin appearance had no abnormalities noted for moisture. The periwound skin appearance had no abnormalities noted for color. Periwound temperature was noted as No Abnormality. Assessment Active Problems ICD-10 Non-pressure chronic ulcer of other part of left foot with bone involvement without evidence of necrosis Type 2 diabetes mellitus with foot ulcer Peripheral vascular disease, unspecified Chronic ischemic heart disease, unspecified Venous insufficiency (chronic) (peripheral) Unspecified diastolic (congestive) heart failure Pulmonary hypertension, unspecified Hunter [name], Hunter [name] ([id number]) [id number] Page 7 of 9 Chronic obstructive pulmonary disease, unspecified Dependence on supplemental oxygen Procedures Wound #1 Pre-procedure diagnosis of Wound #1 is a Diabetic Wound/Ulcer of the Lower Extremity located on the Left T Second .Severity of Tissue Pre Debridement oe is: Fat layer exposed. There was a Selective/Open Wound Non-Viable Tissue Debridement with a total area of 0.07 sq cm performed by Hunter Guess, MD. With the following instrument(s): Curette to remove Non-Viable tissue/material. Material removed includes [facility] after achieving pain control using Lidocaine 5% topical ointment. No specimens were taken. A time out was conducted at 09:52, prior to the start of the procedure. A  Minimum amount of bleeding was controlled with Pressure. The procedure was tolerated well with a pain level of 0 throughout and a pain level of 0 following the procedure. Post Debridement Measurements: 0.3cm length x 0.3cm width x 0.1cm depth; 0.007cm^3 volume. Character of Wound/Ulcer Post Debridement is improved. Severity of Tissue Post Debridement is: Fat layer exposed. Post procedure Diagnosis Wound #1: Same as Pre-Procedure General Notes: Scribed for Hunter [name] by Hunter [name]. Plan Follow-up Appointments: Return Appointment in 2 weeks. - Hunter [name] Anesthetic: (In clinic) Topical Lidocaine 4% applied to wound bed - Used in Clinic prior to debridement Bathing/ Shower/ Hygiene: May shower and wash wound with soap and water. - May change dressing after bathing Edema Control - Lymphedema / SCD / Other: Avoid standing for long periods of time. Patient to wear own compression stockings every day. Other Edema Control Orders/Instructions: - Elevate feet throughout the day Off-Loading: Other: - Place pillows under ankles to off-load-especially while in bed. Use Wheelchair as tolerated. WOUND #1: - T Second Wound Laterality: Left oe Cleanser: Soap and Water 1 x Per Day/30 Days Discharge Instructions: May shower and wash wound with dial antibacterial soap and water prior to dressing change. Cleanser: Wound Cleanser (Generic) 1 x Per Day/30 Days Discharge Instructions: Cleanse the wound with wound cleanser prior to applying a clean dressing using gauze sponges,  not tissue or cotton balls. Prim Dressing: Maxorb Extra Ag+ Alginate Dressing, 2x2 (in/in) 1 x Per Day/30 Days ary Discharge Instructions: Apply to wound bed as instructed Prim Dressing: Optifoam Non-Adhesive Dressing, 4x4 in 1 x Per Day/30 Days ary Discharge Instructions: Apply to wound bed as instructed Secondary Dressing: Woven Gauze Sponges 2x2 in (Generic) 1 x Per Day/30 Days Discharge Instructions: Apply over primary dressing as  directed. Secured With: Insurance underwriter, Sterile 2x75 (in/in) (Generic) 1 x Per Day/30 Days Discharge Instructions: Secure with stretch gauze as directed. Secured With: Paper T ape, 2x10 (in/yd) (Generic) 1 x Per Day/30 Days Discharge Instructions: Secure dressing with tape as directed. Secured With: Netting, Tubular #2 (Generic) 1 x Per Day/30 Days Discharge Instructions: apply over dressing 06/25/2022: His wound continues to improve quite remarkably. He has completed his oral antibiotics. The wound is now quite superficial with just a small remaining open area of a couple of millimeters. I used a curette to debride slough from his wound. We will continue silver alginate dressing changes. I think the antibiotic therapy was key to the progress he has made. He will follow-up in 2 weeks. Electronic Signature(s) Signed: 06/25/2022 5:35:38 PM By: Shawn Stall RN, BSN Signed: 06/26/2022 8:12:41 AM By: Hunter Guess MD FACS Previous Signature: 06/25/2022 9:59:14 AM Version By: Hunter Guess MD FACS Entered By: Shawn Stall on 06/25/2022 17:32:31 -------------------------------------------------------------------------------- HxROS Details Patient Name: Date of Service: Hunter [name], Hunter Hunter LD R. 06/25/2022 9:30 A Hunter [name], [name] [id number] Page 8 of 9 Medical Record Number: [id number] Patient Account Number: [id number] Date of Birth/Sex: Treating RN: 27-Feb-1950 (72 y.o. M) Primary Care Provider: Jarome [name] Other Clinician: Referring Provider: Treating Provider/Extender: Hunter [name] in Treatment: 12 Respiratory Medical History: Positive for: Chronic Obstructive Pulmonary Disease (COPD); Sleep Apnea - wears a CPAP Cardiovascular Medical History: Positive for: Hypertension; Myocardial Infarction Past Medical History Notes: Carotid Artery Disease Chronic Venous Insufficiency Peripheral arterial  occlusive disease Gastrointestinal Medical History: Past Medical History Notes: GERD Endocrine Medical History: Positive for: Type II Diabetes Time with diabetes: 15 years Treated with: Insulin Blood sugar tested every day: Yes Tested : 2 x day Genitourinary Medical History: Past Medical History Notes: ED (of organic origin) Urinary Incontinence Musculoskeletal Medical History: Past Medical History Notes: Left Foot - Foot Drop Neurologic Medical History: Positive for: Neuropathy Past Medical History Notes: Left Foot Drop Immunizations Pneumococcal Vaccine: Received Pneumococcal Vaccination: No Implantable Devices None Hospitalization / Surgery History Type of Hospitalization/Surgery CABG (2005); Family and Social History Former smoker - ended on 01/01/2009; Marital Status - Married; Alcohol Use: Never; Drug Use: No History; Caffeine Use: Never; Financial Concerns: No; Food, Clothing or Shelter Needs: No; Support System Lacking: No; Transportation Concerns: No Electronic Signature(s) Signed: 06/25/2022 10:21:53 AM By: Hunter Guess MD FACS Entered By: Hunter [name] on 06/25/2022 09:57:05 [name] ([id number]) [id number] Page 9 of 9 -------------------------------------------------------------------------------- SuperBill Details Patient Name: Date of Service: Hunter Hunter [name] Hunter LD R. 06/25/2022 Medical Record Number: [id number] Patient Account Number: [id number] Date of Birth/Sex: Treating RN: [birth year] (72 y.o. 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) I50.30  Unspecified diastolic (congestive) heart failure I27.20 Pulmonary hypertension, unspecified J44.9 Chronic obstructive pulmonary disease, unspecified Z99.81 Dependence on supplemental oxygen Facility Procedures : CPT4 Code: [id number] Description: 97597 - DEBRIDE WOUND 1ST 20 SQ CM OR < ICD-10 Diagnosis Description L97.526 Non-pressure chronic ulcer of other part of left foot with bone involvement witho Modifier: ut evidence of necr Quantity: 1 osis Physician Procedures : CPT4 Code Description Modifier [id number] - WC PHYS LEVEL 3 - EST PT 25 ICD-10 Diagnosis Description L97.526 Non-pressure chronic ulcer of other part of left foot with bone involvement without evidence of necro E11.621 Type 2 diabetes mellitus with  foot ulcer I73.9 Peripheral vascular disease, unspecified I50.30 Unspecified diastolic (congestive) heart failure Quantity: 1 sis : [id number] 97597 - WC PHYS DEBR WO ANESTH 20 SQ CM ICD-10 Diagnosis Description L97.526 Non-pressure chronic ulcer of other 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

## 2022-07-08 ENCOUNTER — Other Ambulatory Visit (HOSPITAL_COMMUNITY): Payer: Self-pay

## 2022-07-09 ENCOUNTER — Encounter (HOSPITAL_BASED_OUTPATIENT_CLINIC_OR_DEPARTMENT_OTHER): Payer: Medicare Other | Attending: General Surgery | Admitting: General Surgery

## 2022-07-09 ENCOUNTER — Other Ambulatory Visit: Payer: Self-pay

## 2022-07-09 ENCOUNTER — Other Ambulatory Visit (HOSPITAL_COMMUNITY): Payer: Self-pay

## 2022-07-09 DIAGNOSIS — I872 Venous insufficiency (chronic) (peripheral): Secondary | ICD-10-CM | POA: Insufficient documentation

## 2022-07-09 DIAGNOSIS — I110 Hypertensive heart disease with heart failure: Secondary | ICD-10-CM | POA: Diagnosis not present

## 2022-07-09 DIAGNOSIS — Z9981 Dependence on supplemental oxygen: Secondary | ICD-10-CM | POA: Diagnosis not present

## 2022-07-09 DIAGNOSIS — Z6837 Body mass index (BMI) 37.0-37.9, adult: Secondary | ICD-10-CM | POA: Diagnosis not present

## 2022-07-09 DIAGNOSIS — I5032 Chronic diastolic (congestive) heart failure: Secondary | ICD-10-CM | POA: Insufficient documentation

## 2022-07-09 DIAGNOSIS — E1151 Type 2 diabetes mellitus with diabetic peripheral angiopathy without gangrene: Secondary | ICD-10-CM | POA: Insufficient documentation

## 2022-07-09 DIAGNOSIS — Z8631 Personal history of diabetic foot ulcer: Secondary | ICD-10-CM | POA: Diagnosis not present

## 2022-07-09 DIAGNOSIS — E1142 Type 2 diabetes mellitus with diabetic polyneuropathy: Secondary | ICD-10-CM | POA: Diagnosis not present

## 2022-07-09 DIAGNOSIS — Z09 Encounter for follow-up examination after completed treatment for conditions other than malignant neoplasm: Secondary | ICD-10-CM | POA: Diagnosis not present

## 2022-07-09 DIAGNOSIS — I272 Pulmonary hypertension, unspecified: Secondary | ICD-10-CM | POA: Diagnosis not present

## 2022-07-09 DIAGNOSIS — J449 Chronic obstructive pulmonary disease, unspecified: Secondary | ICD-10-CM | POA: Diagnosis not present

## 2022-07-09 MED ORDER — TECHLITE PEN NEEDLES 32G X 4 MM MISC
1.0000 | Freq: Four times a day (QID) | 5 refills | Status: DC
  Filled 2022-07-09 (×3): qty 400, 100d supply, fill #0
  Filled 2022-12-04: qty 400, 100d supply, fill #1
  Filled 2023-04-15: qty 400, 100d supply, fill #2

## 2022-07-09 MED ORDER — METHOCARBAMOL 500 MG PO TABS
500.0000 mg | ORAL_TABLET | Freq: Every evening | ORAL | 3 refills | Status: DC
  Filled 2022-07-09: qty 90, 90d supply, fill #0
  Filled 2022-09-10 – 2022-10-08 (×2): qty 90, 90d supply, fill #1
  Filled 2023-01-01: qty 90, 90d supply, fill #2
  Filled 2023-04-15: qty 90, 90d supply, fill #3

## 2022-07-09 NOTE — Progress Notes (Signed)
Hunter, [name] ([id number]) [id number] Page 1 of 8 Visit Report for 07/09/2022 Arrival Information Details Patient Name: Date of Service: [name]. 07/09/2022 9:30 A M Medical Record Number: [id number] Patient Account Number: [id number] Date of Birth/Sex: Treating RN: 1950-01-11 (72 y.o. M) Primary Care Hunter [name]: Hunter [name] Other Clinician: Referring Hunter [name]: Treating Hunter [name]/Extender: Hunter [name] in Treatment: 14 Visit Information History Since Last Visit All ordered tests and consults were completed: No Patient Arrived: Wheel Chair Added or deleted any medications: No Arrival Time: 09:48 Any new allergies or adverse reactions: No Accompanied By: wife Had a fall or experienced change in No Transfer Assistance: None activities of daily living that may affect Patient Identification Verified: Yes risk of falls: Secondary Verification Process Completed: Yes Signs or symptoms of abuse/neglect since last visito No Patient Requires Transmission-Based Precautions: No Hospitalized since last visit: No Patient Has Alerts: Yes Implantable device outside of the clinic excluding No Patient Alerts: ABI [name] Hunter (03/02/22) cellular tissue based products placed in the center ABI L [place] (03/02/22) since last visit: Diuretic-Lasix Pain Present Now: No Plavix Electronic Signature(s) Signed: 07/09/2022 3:58:16 PM By: Hunter [name] Entered By: Hunter [name] on 07/09/2022 09:48:55 -------------------------------------------------------------------------------- Clinic Level of Care Assessment Details Patient Name: Date of Service: [name]. 07/09/2022 9:30 A M Medical Record Number: [id number] Patient Account Number: [id number] Date of Birth/Sex: Treating RN: [birth year] (72 y.o. Hunter [name] Primary Care Hunter [name]: Hunter [name] Other Clinician: Referring Hunter [name]: Treating Hunter [name]: Hunter [name] in Treatment: 14 Clinic Level of Care Assessment Items TOOL 4 Quantity Score X- 1 0 Use when only an EandM is performed on FOLLOW-UP visit ASSESSMENTS - Nursing Assessment / Reassessment []  - 0 Reassessment of Co-morbidities (includes updates in patient status) X- 1 5 Reassessment of Adherence to Treatment Plan ASSESSMENTS - Wound and Skin A ssessment / Reassessment X - Simple Wound Assessment / Reassessment - one wound 1 5 []  - 0 Complex Wound Assessment / Reassessment - multiple wounds []  - 0 Dermatologic / Skin Assessment (not related to wound area) ASSESSMENTS - Focused Assessment []  - 0 Circumferential Edema Measurements - multi extremities []  - 0 Nutritional Assessment / Counseling / Intervention Hunter [name], Hunter [name] ([id number]) [id number] Page 2 of 8 []  - 0 Lower Extremity Assessment (monofilament, tuning fork, pulses) []  - 0 Peripheral Arterial Disease Assessment (using hand held doppler) ASSESSMENTS - Ostomy and/or Continence Assessment and Care []  - 0 Incontinence Assessment and Management []  - 0 Ostomy Care Assessment and Management (repouching, etc.) PROCESS - Coordination of Care X - Simple Patient / Family Education for ongoing care 1 15 []  - 0 Complex (extensive) Patient / Family Education for ongoing care X- 1 10 Staff obtains Chiropractor, Records, T Results / Process Orders est []  - 0 Staff telephones HHA, Nursing Homes / Clarify orders / etc []  - 0 Routine Transfer to another Facility (non-emergent condition) []  - 0 Routine Hospital Admission (non-emergent condition) []  - 0 New Admissions / Manufacturing engineer / Ordering NPWT Apligraf, etc. , []  - 0 Emergency Hospital Admission (emergent condition) X- 1 10 Simple Discharge Coordination []  - 0 Complex (extensive) Discharge Coordination PROCESS - Special Needs []  - 0 Pediatric / Minor Patient Management []  - 0 Isolation Patient  Management []  - 0 Hearing / Language / Visual special needs []  - 0 Assessment of Community assistance (transportation, D/C planning, etc.) []  - 0 Additional assistance / Altered mentation []  - 0  Support Surface(s) Assessment (bed, cushion, seat, etc.) INTERVENTIONS - Wound Cleansing / Measurement []  - 0 Simple Wound Cleansing - one wound []  - 0 Complex Wound Cleansing - multiple wounds X- 1 5 Wound Imaging (photographs - any number of wounds) []  - 0 Wound Tracing (instead of photographs) []  - 0 Simple Wound Measurement - one wound []  - 0 Complex Wound Measurement - multiple wounds INTERVENTIONS - Wound Dressings X - Small Wound Dressing one or multiple wounds 1 10 []  - 0 Medium Wound Dressing one or multiple wounds []  - 0 Large Wound Dressing one or multiple wounds X- 1 5 Application of Medications - topical []  - 0 Application of Medications - injection INTERVENTIONS - Miscellaneous []  - 0 External ear exam []  - 0 Specimen Collection (cultures, biopsies, blood, body fluids, etc.) []  - 0 Specimen(s) / Culture(s) sent or taken to Lab for analysis []  - 0 Patient Transfer (multiple staff / Nurse, adult / Similar devices) []  - 0 Simple Staple / Suture removal (25 or less) []  - 0 Complex Staple / Suture removal (26 or more) []  - 0 Hypo / Hyperglycemic Management (close monitor of Blood Glucose) Hunter [name], Hunter [name] ([id number]) [id number] Page 3 of 8 []  - 0 Ankle / Brachial Index (ABI) - do not check if billed separately X- 1 5 Vital Signs Has the patient been seen at the hospital within the last three years: Yes Total Score: 70 Level Of Care: New/Established - Level 2 Electronic Signature(s) Signed: 07/09/2022 4:26:55 PM By: Hunter [name] Entered By: Hunter [name] on 07/09/2022 10:15:07 -------------------------------------------------------------------------------- Encounter Discharge Information Details Patient Name: Date of  Service: [name], [name]. 07/09/2022 9:30 A M Medical Record Number: [id number] Patient Account Number: [id number] Date of Birth/Sex: Treating RN: 13-Feb-1950 (72 y.o. Hunter [name] Primary Care Angenette Daily: Hunter [name] Other Clinician: Referring Kristan Brummitt: Treating Tanijah Morais/Extender: Hunter [name] in Treatment: 14 Encounter Discharge Information Items Discharge Condition: Stable Ambulatory Status: Wheelchair Discharge Destination: Home Transportation: Private Auto Accompanied By: self Schedule Follow-up Appointment: Yes Clinical Summary of Care: Patient Declined Electronic Signature(s) Signed: 07/09/2022 4:26:55 PM By: Hunter [name] Entered By: Hunter [name] on 07/09/2022 10:15:38 -------------------------------------------------------------------------------- Lower Extremity Assessment Details Patient Name: Date of Service: [name], [name]. 07/09/2022 9:30 A M Medical Record Number: [id number] Patient Account Number: [id number] Date of Birth/Sex: Treating RN: 1950/06/24 (72 y.o. Hunter [name] Primary Care Rogue Rafalski: Hunter [name] Other Clinician: Referring Gery Sabedra: Treating Evon Lopezperez/Extender: Hunter [name] in Treatment: 14 Edema Assessment Assessed: Kyra Searles: No] Franne Forts: No] Edema: [Left: Ye] [Right: s] Calf Left: Right: Point of Measurement: 29 cm From Medial Instep 39.8 cm Ankle Left: Right: Point of Measurement: 5 cm From Medial Instep 29.4 cm Electronic Signature(s) Signed: 07/09/2022 4:26:55 PM By: Hershal Coria, Reece R4:26:55 PM By: Darcella Gasman Signed: 07/09/2022 ([id number]) [id number] Page 4 of 8 aylor Entered By: Hunter [name] on 07/09/2022 10:08:22 -------------------------------------------------------------------------------- Multi Wound Chart Details Patient Name: Date of Service: [name]. 07/09/2022 9:30 A  M Medical Record Number: [id number] Patient Account Number: [id number] Date of Birth/Sex: Treating RN: 1950/12/31 (72 y.o. M) Primary Care Severin Bou: Hunter [name] Other Clinician: Referring Shakeisha Horine: Treating Enslie Sahota/Extender: Hunter [name] in Treatment: 14 Vital Signs Height(in): 66 Pulse(bpm): 69 Weight(lbs): 235 Blood Pressure(mmHg): 155/71 Body Mass Index(BMI): 37.9 Temperature(F): 98.3 Respiratory Rate(breaths/min): 18 [1:Photos:] [N/A:N/A] Left T Second oe N/A N/A Wound Location: Gradually Appeared N/A N/A Wounding Event: Diabetic Wound/Ulcer of the  Lower N/A N/A Primary Etiology: Extremity Chronic Obstructive Pulmonary N/A N/A Comorbid History: Disease (COPD), Sleep Apnea, Hypertension, Myocardial Infarction, Type II Diabetes, Neuropathy 01/25/2022 N/A N/A Date Acquired: 14 N/A N/A Weeks of Treatment: Healed - Epithelialized N/A N/A Wound Status: No N/A N/A Wound Recurrence: 0x0x0 N/A N/A Measurements L x W x D (cm) 0 N/A N/A A (cm) : rea 0 N/A N/A Volume (cm) : 100.00% N/A N/A % Reduction in A rea: 100.00% N/A N/A % Reduction in Volume: Grade 2 N/A N/A Classification: None Present N/A N/A Exudate A mount: Epibole N/A N/A Wound Margin: None Present (0%) N/A N/A Granulation A mount: None Present (0%) N/A N/A Necrotic A mount: Fascia: No N/A N/A Exposed Structures: Fat Layer (Subcutaneous Tissue): No Tendon: No Muscle: No Joint: No Bone: No Small (1-33%) N/A N/A Epithelialization: No Abnormalities Noted N/A N/A Periwound Skin Texture: No Abnormalities Noted N/A N/A Periwound Skin Moisture: No Abnormalities Noted N/A N/A Periwound Skin Color: No Abnormality N/A N/A Temperature: Treatment Notes Wound #1 (Toe Second) Wound Laterality: Left Cleanser Peri-Wound Care Hunter [name], [name] [id number] Page 5 of 8 Topical Primary Dressing Secondary Dressing Secured  With Compression Wrap Compression Stockings Add-Ons Electronic Signature(s) Signed: 07/09/2022 10:17:11 AM By: Duanne Guess MD FACS Entered By: Duanne Guess on 07/09/2022 10:17:10 -------------------------------------------------------------------------------- Multi-Disciplinary Care Plan Details Patient Name: Date of Service: [name], [name]. 07/09/2022 9:30 A M Medical Record Number: [id number] Patient Account Number: [id number] Date of Birth/Sex: Treating RN: 24-Jul-1950 (72 y.o. Hunter [name] Primary Care Chayden Garrelts: Hunter [name] Other Clinician: Referring Teddy Rebstock: Treating Doss Cybulski/Extender: Hunter [name] in Treatment: 14 Active Inactive Electronic Signature(s) Signed: 07/09/2022 4:26:55 PM By: Gelene Mink By: Hunter [name] on 07/09/2022 10:35:47 -------------------------------------------------------------------------------- Pain Assessment Details Patient Name: Date of Service: [name], [name]. 07/09/2022 9:30 A M Medical Record Number: [id number] Patient Account Number: [id number] Date of Birth/Sex: Treating RN: November 05, 1950 (72 y.o. M) Primary Care Gurnoor Sloop: Hunter [name] Other Clinician: Referring Nehemie Casserly: Treating Angeliah Wisdom/Extender: Hunter [name] in Treatment: 14 Active Problems Location of Pain Severity and Description of Pain Patient Has Paino No Site Locations Hunter [name], Hunter [name] ([id number]) [id number] Page 6 of 8 Pain Management and Medication Current Pain Management: Electronic Signature(s) Signed: 07/09/2022 3:58:16 PM By: Hunter [name] Entered By: Hunter [name] on 07/09/2022 09:49:22 -------------------------------------------------------------------------------- Patient/Caregiver Education Details Patient Name: Date of Service: [name]. 7/8/2024andnbsp9:30 A M Medical Record Number: [id number] Patient Account Number:  [id number] Date of Birth/Gender: Treating RN: 1950/10/29 (72 y.o. Hunter [name] Primary Care Physician: Hunter [name] Other Clinician: Referring Physician: Treating Physician/Extender: Hunter [name] in Treatment: 14 Education Assessment Education Provided To: Patient Education Topics Provided Safety: Methods: Explain/Verbal Responses: Reinforcements needed, State content correctly Electronic Signature(s) Signed: 07/09/2022 4:26:55 PM By: Hunter [name] Entered By: Hunter [name] on 07/09/2022 10:14:40 -------------------------------------------------------------------------------- Wound Assessment Details Patient Name: Date of Service: [name], [name]. 07/09/2022 9:30 A M Medical Record Number: [id number] Patient Account Number: [id number] Date of Birth/Sex: Treating RN: 05/02/1950 (72 y.o. Hunter [name] Primary Care Melvina Pangelinan: Hunter [name] Other Clinician: Referring Laakea Pereira: Treating Alexzavier Girardin/Extender: Hunter [name], Hunter [name] ([id number]) [phone].pdf Page 7 of 8 Weeks in Treatment: 14 Wound Status Wound Number: 1 Primary Diabetic Wound/Ulcer of the Lower Extremity Etiology: Wound Location: Left T Second oe Wound Healed - Epithelialized Wounding Event: Gradually Appeared Status: Date Acquired: 01/25/2022 Comorbid Chronic Obstructive Pulmonary Disease (COPD),  Sleep Apnea, Weeks Of Treatment: 14 History: Hypertension, Myocardial Infarction, Type II Diabetes, Neuropathy Clustered Wound: No Photos Wound Measurements Length: (cm) Width: (cm) Depth: (cm) Area: (cm) Volume: (cm) 0 % Reduction in Area: 100% 0 % Reduction in Volume: 100% 0 Epithelialization: Small (1-33%) 0 Tunneling: No 0 Undermining: No Wound Description Classification: Grade 2 Wound Margin: Epibole Exudate Amount: None Present Foul Odor After Cleansing: No Slough/Fibrino No Wound  Bed Granulation Amount: None Present (0%) Exposed Structure Necrotic Amount: None Present (0%) Fascia Exposed: No Fat Layer (Subcutaneous Tissue) Exposed: No Tendon Exposed: No Muscle Exposed: No Joint Exposed: No Bone Exposed: No Periwound Skin Texture Texture Color No Abnormalities Noted: Yes No Abnormalities Noted: Yes Moisture Temperature / Pain No Abnormalities Noted: Yes Temperature: No Abnormality Treatment Notes Wound #1 (Toe Second) Wound Laterality: Left Cleanser Peri-Wound Care Topical Primary Dressing Secondary Dressing Secured With Compression Wrap Compression Stockings Add-Ons Electronic Signature(s) Signed: 07/09/2022 4:26:55 PM By: Wilber Bihari ([id number]) [id number] Page 8 of 8 Entered By: Hunter [name] on 07/09/2022 10:12:47 -------------------------------------------------------------------------------- Vitals Details Patient Name: Date of Service: [name]. 07/09/2022 9:30 A M Medical Record Number: [id number] Patient Account Number: [id number] Date of Birth/Sex: Treating RN: 1950-05-13 (72 y.o. M) Primary Care Yariana Hoaglund: Hunter [name] Other Clinician: Referring Mauro Arps: Treating Nelwyn Hebdon/Extender: Hunter [name] in Treatment: 14 Vital Signs Time Taken: 09:48 Temperature (F): 98.3 Height (in): 66 Pulse (bpm): 69 Weight (lbs): 235 Respiratory Rate (breaths/min): 18 Body Mass Index (BMI): 37.9 Blood Pressure (mmHg): 155/71 Reference Range: 80 - 120 mg / dl Electronic Signature(s) Signed: 07/09/2022 3:58:16 PM By: Hunter [name] Entered By: Hunter [name] on 07/09/2022 09:49:17

## 2022-07-09 NOTE — Progress Notes (Signed)
Hunter [name], [name] [id number] Page 1 of 8 Visit Report for 07/09/2022 Chief Complaint Document Details Patient Name: Date of Service: Hunter Hunter [name]. 07/09/2022 9:30 A M Medical Record Number: [id number] Patient Account Number: [id number] Date of Birth/Sex: Treating RN: 08/28/1950 (72 y.o. M) Primary Care Provider: Jarome Matin Other Clinician: Referring Provider: Treating Provider/Extender: Marena Chancy in Treatment: 14 Information Obtained from: Patient Chief Complaint Patients presents for treatment of an open diabetic ulcer Electronic Signature(s) Signed: 07/09/2022 10:17:18 AM By: Duanne Guess MD FACS Entered By: Duanne Guess on 07/09/2022 10:17:18 -------------------------------------------------------------------------------- HPI Details Patient Name: Date of Service: Hunter [name], Hunter [name]. 07/09/2022 9:30 A M Medical Record Number: [id number] Patient Account Number: [id number] Date of Birth/Sex: Treating RN: 1950/09/17 (72 y.o. M) Primary Care Provider: Jarome Matin Other Clinician: Referring Provider: Treating Provider/Extender: Marena Chancy in Treatment: 14 History of Present Illness HPI Description: ADMISSION 04/02/2022 This is a 72 year old man with multiple comorbidities including congestive heart failure, chronic ischemic heart disease, COPD, oxygen dependence, diabetes (last hemoglobin A1c 7.2%), morbid obesity, peripheral artery disease, chronic venous insufficiency, and peripheral neuropathy, among others. He routinely follows with podiatry for his diabetic footcare. In February, he was being seen for his regular nail debridement and was noted to have an ulceration on the second digit of his left foot. He had diminished pedal pulses and he was referred for ABI testing. ABIs were noncompressible bilaterally and he had abnormal diminished TBI's of 0. 4 7 on  the right and 0.33 on the left. Based upon these findings, along with a nonhealing ulcer, vascular surgery was consulted. On March 13, 2022, he had an aortogram with left lower extremity runoff. Stents were placed in his left superficial femoral-popliteal artery, left external iliac, and left common iliac artery. Post stenting, he had three-vessel runoff. He saw Dr. Allena Katz after undergoing the stent procedure. As he already had a wound care consultation scheduled, Dr. Allena Katz simply had him continue dressing changes until his visit today. On the dorsal surface of his left second toe, there is an ulcer with slough and nonviable tendon visible. On further inspection, the joint space of the bone and the end of the proximal phalanx are both visible. 04/10/2022: The biopsy that I took last week showed chronic inflammation, but no osteomyelitis. The wound is cleaner this week with granulation tissue beginning to fill in. There is still a little bit of tendon exposed. 04/20/2022: There is more granulation tissue filling in the wound. Bone remains exposed with some slough accumulation. He saw vascular surgery earlier this week and has improved toe pressures although there is some evidence of stenosis in both his common and external iliac stents as well as stenosis proximal to the stent. They plan to bring him back for short-term interval follow-up to monitor this closely and intervene if necessary. 04/30/2022: The wound is smaller with good granulation tissue. Bone is no longer exposed. There is some slough accumulation. 05/11/2022: The wound continues to contract. Bone is completely covered with good granulation tissue. Minimal slough accumulation. Unfortunately, he was denied for a skin substitute. 05/25/2022: The wound is smaller again today with good granulation tissue on the surface. The x-ray that I had done after his last visit unfortunately did show some evidence of acute osteomyelitis, despite the negative  biopsy in April. He is leaning towards going ahead with amputation just to be done with this process. 06/08/2022: There has been a rather dramatic  turnaround in his wound. It has filled with good granulation tissue and much of it has epithelialized. He has decided Hunter [name], [name] [id number] Page 2 of 8 he wants to hold off on amputation for now. 06/25/2022: His wound continues to improve quite remarkably. He has completed his oral antibiotics. The wound is now quite superficial with just a small remaining open area of a couple of millimeters. 07/09/2022: His wound is healed. Electronic Signature(s) Signed: 07/09/2022 10:17:40 AM By: Duanne Guess MD FACS Entered By: Duanne Guess on 07/09/2022 10:17:40 -------------------------------------------------------------------------------- Physical Exam Details Patient Name: Date of Service: Hunter [name], Hunter [name]. 07/09/2022 9:30 A M Medical Record Number: [id number] Patient Account Number: [id number] Date of Birth/Sex: Treating RN: [birth year] (72 y.o. M) Primary Care Provider: Jarome Matin Other Clinician: Referring Provider: Treating Provider/Extender: Marena Chancy in Treatment: 14 Constitutional Hypertensive, asymptomatic. . . . no acute distress. Respiratory Normal work of breathing on room air. Notes 07/09/2022: His wound is healed. Electronic Signature(s) Signed: 07/09/2022 10:18:29 AM By: Duanne Guess MD FACS Entered By: Duanne Guess on 07/09/2022 10:18:29 -------------------------------------------------------------------------------- Physician Orders Details Patient Name: Date of Service: Hunter Hunter [name]. 07/09/2022 9:30 A M Medical Record Number: [id number] Patient Account Number: [id number] Date of Birth/Sex: Treating RN: 1950-12-10 (72 y.o. 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!! Anesthetic (In clinic) Topical Lidocaine 4% applied to wound bed Edema Control - Lymphedema / SCD / Other Bilateral Lower Extremities Elevate legs to the level of the heart or above for 30 minutes daily and/or when sitting for 3-4 times a day throughout the day. Avoid standing for long periods of time. Patient to wear own compression stockings every day. Other Edema Control Orders/Instructions: - Elevate feet throughout the day Off-Loading Other: - Place pillows under ankles to off-load-especially while in bed. Use Wheelchair as tolerated. Additional Orders / Instructions Other: - protect the toe with a pad or gauze so it does not rub on your shoe Patient Medications llergies: codeine, Glucophage, amitriptyline, Chantix, Glucophage, ranolazine, lisinopril, ramipril, adhesive tape, adhesive tape A Notifications Medication Indication Start End 07/09/2022 lidocaine DOSE topical 4 % cream - cream topical Electronic Signature(s) Signed: 07/09/2022 10:20:35 AM By: Duanne Guess MD FACS Entered By: Duanne Guess on 07/09/2022  10:18:39 -------------------------------------------------------------------------------- Problem List Details Patient Name: Date of Service: Hunter [name], Hunter [name]. 07/09/2022 9:30 A M Medical Record Number: [id number] Patient Account Number: [id number] Date of Birth/Sex: Treating RN: [birth year] (72 y.o. M) Primary Care Provider: Jarome Matin Other Clinician: Referring Provider: Treating Provider/Extender: Marena Chancy in Treatment: 14 Active Problems ICD-10 Encounter Code Description Active Date MDM Diagnosis L97.526 Non-pressure chronic ulcer of other part of left foot with bone involvement 04/02/2022 No Yes without evidence of necrosis E11.621 Type 2 diabetes mellitus with foot ulcer 04/02/2022 No Yes I73.9 Peripheral  vascular disease, unspecified 04/02/2022 No Yes I25.9 Chronic ischemic heart disease, unspecified 04/02/2022 No Yes I87.2 Venous insufficiency (chronic) (peripheral) 04/02/2022 No Yes Hunter [name], [name] [id number] Page 4 of 8 I50.30 Unspecified diastolic (congestive) heart failure 04/02/2022 No Yes I27.20 Pulmonary hypertension, unspecified 04/02/2022 No Yes J44.9 Chronic obstructive pulmonary disease, unspecified 04/02/2022 No Yes Z99.81 Dependence on supplemental oxygen 04/02/2022 No Yes Inactive Problems Resolved Problems Electronic Signature(s) Signed: 07/09/2022 10:16:55 AM By: Duanne Guess MD FACS Entered By: Duanne Guess on 07/09/2022 10:16:55 -------------------------------------------------------------------------------- Progress Note Details Patient Name: Date of Service: Hunter [name], Hunter [name]. 07/09/2022 9:30 A M Medical Record Number: [id number] Patient Account Number: [id number] Date of Birth/Sex: Treating RN: 05-Sep-1950 (72 y.o. M) Primary Care Provider: Jarome Matin Other Clinician: Referring Provider: Treating Provider/Extender: Marena Chancy in Treatment:  14 Subjective Chief Complaint Information obtained from Patient Patients presents for treatment of an open diabetic ulcer History of Present Illness (HPI) ADMISSION 04/02/2022 This is a 72 year old man with multiple comorbidities including congestive heart failure, chronic ischemic heart disease, COPD, oxygen dependence, diabetes (last hemoglobin A1c 7.2%), morbid obesity, peripheral artery disease, chronic venous insufficiency, and peripheral neuropathy, among others. He routinely follows with podiatry for his diabetic footcare. In February, he was being seen for his regular nail debridement and was noted to have an ulceration on the second digit of his left foot. He had diminished pedal pulses and he was referred for ABI testing. ABIs were noncompressible bilaterally and he had abnormal diminished TBI's of 0. 4 7 on the right and 0.33 on the left. Based upon these findings, along with a nonhealing ulcer, vascular surgery was consulted. On March 13, 2022, he had an aortogram with left lower extremity runoff. Stents were placed in his left superficial femoral-popliteal artery, left external iliac, and left common iliac artery. Post stenting, he had three-vessel runoff. He saw Dr. Allena Katz after undergoing the stent procedure. As he already had a wound care consultation scheduled, Dr. Allena Katz simply had him continue dressing changes until his visit today. On the dorsal surface of his left second toe, there is an ulcer with slough and nonviable tendon visible. On further inspection, the joint space of the bone and the end of the proximal phalanx are both visible. 04/10/2022: The biopsy that I took last week showed chronic inflammation, but no osteomyelitis. The wound is cleaner this week with granulation tissue beginning to fill in. There is still a little bit of tendon exposed. 04/20/2022: There is more granulation tissue filling in the wound. Bone remains exposed with some slough accumulation. He saw  vascular surgery earlier this week and has improved toe pressures although there is some evidence of stenosis in both his common and external iliac stents as well as stenosis proximal to the stent. They plan to bring him back for short-term interval follow-up to monitor this closely and intervene if necessary. 04/30/2022: The wound is smaller with good granulation tissue. Bone is no longer exposed. There is some slough accumulation. 05/11/2022: The wound continues to contract. Bone is completely covered with good granulation tissue. Minimal slough accumulation. Unfortunately, he was denied for a skin substitute. 05/25/2022: The wound is smaller again today with good granulation tissue on the surface. The x-ray that I had done after his last visit unfortunately did show some evidence of acute osteomyelitis, despite the negative biopsy in April. He is leaning towards going ahead with amputation just to be done with this process. 06/08/2022: There has been a rather  dramatic turnaround in his wound. It has filled with good granulation tissue and much of it has epithelialized. He has decided JABRIEL, HIGINBOTHAM ([id number]) [id number] Page 5 of 8 he wants to hold off on amputation for now. 06/25/2022: His wound continues to improve quite remarkably. He has completed his oral antibiotics. The wound is now quite superficial with just a small remaining open area of a couple of millimeters. 07/09/2022: His wound is healed. Patient History Social History Former smoker - ended on 01/01/2009, Marital Status - Married, Alcohol Use - Never, Drug Use - No History, Caffeine Use - Never. Medical History Respiratory Patient has history of Chronic Obstructive Pulmonary Disease (COPD), Sleep Apnea - wears a CPAP Cardiovascular Patient has history of Hypertension, Myocardial Infarction Endocrine Patient has history of Type II Diabetes Neurologic Patient has history of  Neuropathy Hospitalization/Surgery History - CABG (2005);[name] Surgical History Notes nd Cardiovascular Carotid Artery Disease Chronic Venous Insufficiency Peripheral arterial occlusive disease Gastrointestinal GERD Genitourinary ED (of organic origin) Urinary Incontinence Musculoskeletal Left Foot - Foot Drop Neurologic Left Foot Drop Objective Constitutional Hypertensive, asymptomatic. no acute distress. Vitals Time Taken: 9:48 AM, Height: 66 in, Weight: 235 lbs, BMI: 37.9, Temperature: 98.3 F, Pulse: 69 bpm, Respiratory Rate: 18 breaths/min, Blood Pressure: 155/71 mmHg. Respiratory Normal work of breathing on room air. General Notes: 07/09/2022: His wound is healed. Integumentary (Hair, Skin) Wound #1 status is Healed - Epithelialized. Original cause of wound was Gradually Appeared. The date acquired was: 01/25/2022. The wound has been in treatment 14 weeks. The wound is located on the Left T Second. The wound measures 0cm length x 0cm width x 0cm depth; 0cm^2 area and 0cm^3 volume. oe There is no tunneling or undermining noted. There is a none present amount of drainage noted. The wound margin is epibole. There is no granulation within the wound bed. There is no necrotic tissue within the wound bed. The periwound skin appearance had no abnormalities noted for texture. The periwound skin appearance had no abnormalities noted for moisture. The periwound skin appearance had no abnormalities noted for color. Periwound temperature was noted as No Abnormality. Assessment Active Problems ICD-10 Non-pressure chronic ulcer of other part of left foot with bone involvement without evidence of necrosis Type 2 diabetes mellitus with foot ulcer Peripheral vascular disease, unspecified Chronic ischemic heart disease, unspecified Venous insufficiency (chronic) (peripheral) Unspecified diastolic (congestive) heart failure Pulmonary hypertension, unspecified Chronic obstructive pulmonary  disease, unspecified Dependence on supplemental oxygen Hunter [name], Hunter [name] ([id number]) [id number] Page 6 of 8 Plan Discharge From [facility] [place] Services: Discharge from Wound Care Center - Congratulations!!!!! Anesthetic: (In clinic) Topical Lidocaine 4% applied to wound bed Edema Control - Lymphedema / SCD / Other: Elevate legs to the level of the heart or above for 30 minutes daily and/or when sitting for 3-4 times a day throughout the day. Avoid standing for long periods of time. Patient to wear own compression stockings every day. Other Edema Control Orders/Instructions: - Elevate feet throughout the day Off-Loading: Other: - Place pillows under ankles to off-load-especially while in bed. Use Wheelchair as tolerated. Additional Orders / Instructions: Other: - protect the toe with a pad or gauze so it does not rub on your shoe The following medication(s) was prescribed: lidocaine topical 4 % cream cream topical was prescribed at facility 07/09/2022: His wound is healed. We will discharge him from the wound care center. I did advise him to continue to protect that site from any friction from his footwear, such as a Band-Aid  or callus pad. I also noticed that he has a total knee long and he is at risk of this digging into his other toe. I told him to put some gauze between his toes and to contact his podiatrist for nail trim. At this time he has no further need of wound care services but he may follow-up in future as needed. Electronic Signature(s) Signed: 07/09/2022 10:19:42 AM By: Duanne Guess MD FACS Entered By: Duanne Guess on 07/09/2022 10:19:42 -------------------------------------------------------------------------------- HxROS Details Patient Name: Date of Service: Hunter [name], Hunter [name]. 07/09/2022 9:30 A M Medical Record Number: [id number] Patient Account Number: [id number] Date of Birth/Sex: Treating RN: June 14, 1950 (72 y.o. M) Primary Care Provider:  Jarome Matin Other Clinician: Referring Provider: Treating Provider/Extender: Marena Chancy in Treatment: 14 Respiratory Medical History: Positive for: Chronic Obstructive Pulmonary Disease (COPD); Sleep Apnea - wears a CPAP Cardiovascular Medical History: Positive for: Hypertension; Myocardial Infarction Past Medical History Notes: Carotid Artery Disease Chronic Venous Insufficiency Peripheral arterial occlusive disease Gastrointestinal Medical History: Past Medical History Notes: GERD Endocrine Medical History: Positive for: Type II Diabetes Time with diabetes: 15 years Treated with: Insulin Blood sugar tested every day: Yes Tested : 2 x day Hunter [name], Hunter [name] ([id number]) [id number] Page 7 of 8 Medical History: Past Medical History Notes: ED (of organic origin) Urinary Incontinence Musculoskeletal Medical History: Past Medical History Notes: Left Foot - Foot Drop Neurologic Medical History: Positive for: Neuropathy Past Medical History Notes: Left Foot Drop Immunizations Pneumococcal Vaccine: Received Pneumococcal Vaccination: No Implantable Devices None Hospitalization / Surgery History Type of Hospitalization/Surgery CABG (2005); Family and Social History Former smoker - ended on 01/01/2009; Marital Status - Married; Alcohol Use: Never; Drug Use: No History; Caffeine Use: Never; Financial Concerns: No; Food, Clothing or Shelter Needs: No; Support System Lacking: No; Transportation Concerns: No Electronic Signature(s) Signed: 07/09/2022 10:20:35 AM By: Duanne Guess MD FACS Entered By: Duanne Guess on 07/09/2022 10:17:56 -------------------------------------------------------------------------------- SuperBill Details Patient Name: Date of Service: Hunter [name], Hunter [name]. 07/09/2022 Medical Record Number: [id number] Patient Account Number: [id number] Date of Birth/Sex: Treating  RN: 1950-10-17 (72 y.o. Hunter [name] Primary Care Provider: Jarome Matin Other Clinician: Referring Provider: Treating Provider/Extender: Marena Chancy in Treatment: 14 Diagnosis Coding ICD-10 Codes Code Description [phone] Non-pressure chronic ulcer of other part of left foot with bone involvement without evidence of necrosis E11.621 Type 2 diabetes mellitus with foot ulcer I73.9 Peripheral vascular disease, unspecified I25.9 Chronic ischemic heart disease, unspecified I87.2 Venous insufficiency (chronic) (peripheral) I50.30 Unspecified diastolic (congestive) heart failure I27.20 Pulmonary hypertension, unspecified J44.9 Chronic obstructive pulmonary disease, unspecified Z99.81 Dependence on supplemental oxygen Facility Procedures : Hunter [name] Code: [id number] , Hunter [name] ([id number]) Description: [phone] - WOUND CARE VISIT-LEV 2 EST PT [phone] Modifier: [id number] Quantity: 1 27.pdf Page 8 of 8 Physician Procedures : CPT4 Code Description Modifier [id number] [phone] - WC PHYS LEVEL 3 - EST PT ICD-10 Diagnosis Description L97.526 Non-pressure chronic ulcer of other part of left foot with bone involvement without evidence of necr E11.621 Type 2 diabetes mellitus with foot  ulcer I73.9 Peripheral vascular disease, unspecified I87.2 Venous insufficiency (chronic) (peripheral) Quantity: 1 osis Electronic Signature(s) Signed: 07/09/2022 10:20:01 AM By: Duanne Guess MD FACS Entered By: Duanne Guess on 07/09/2022 10:20:00

## 2022-07-10 ENCOUNTER — Other Ambulatory Visit (HOSPITAL_COMMUNITY): Payer: Self-pay

## 2022-07-10 ENCOUNTER — Other Ambulatory Visit: Payer: Self-pay

## 2022-07-11 ENCOUNTER — Other Ambulatory Visit (HOSPITAL_COMMUNITY): Payer: Self-pay

## 2022-07-13 ENCOUNTER — Other Ambulatory Visit (HOSPITAL_COMMUNITY): Payer: Self-pay

## 2022-07-13 MED ORDER — CIPROFLOXACIN HCL 500 MG PO TABS
500.0000 mg | ORAL_TABLET | Freq: Two times a day (BID) | ORAL | 0 refills | Status: DC
  Filled 2022-07-13: qty 14, 7d supply, fill #0

## 2022-07-16 ENCOUNTER — Encounter: Payer: Self-pay | Admitting: Surgery

## 2022-07-16 ENCOUNTER — Ambulatory Visit (HOSPITAL_COMMUNITY)
Admission: RE | Admit: 2022-07-16 | Discharge: 2022-07-16 | Disposition: A | Discharge status: Home / Self Care | Payer: Medicare Other | Source: Ambulatory Visit | Attending: Surgery | Admitting: Surgery

## 2022-07-16 ENCOUNTER — Ambulatory Visit (INDEPENDENT_AMBULATORY_CARE_PROVIDER_SITE_OTHER)
Admission: RE | Admit: 2022-07-16 | Discharge: 2022-07-16 | Disposition: A | Discharge status: Home / Self Care | Payer: Medicare Other | Source: Ambulatory Visit | Attending: Surgery | Admitting: Surgery

## 2022-07-16 ENCOUNTER — Ambulatory Visit: Payer: Medicare Other | Admitting: Surgery

## 2022-07-16 VITALS — BP 143/77 | HR 72 | Temp 98.2°F | Resp 20 | Ht 66.0 in | Wt 230.0 lb

## 2022-07-16 DIAGNOSIS — I70245 Atherosclerosis of native arteries of left leg with ulceration of other part of foot: Secondary | ICD-10-CM

## 2022-07-16 DIAGNOSIS — I739 Peripheral vascular disease, unspecified: Secondary | ICD-10-CM | POA: Diagnosis present

## 2022-07-16 LAB — VAS US ABI WITH/WO TBI

## 2022-07-16 NOTE — H&P (View-Only) (Signed)
[facility] [place]  Patient name: Hunter [name] MRN: [id number] DOB: [birth year] Sex: male   REASON FOR VISIT:    Follow up  HISOTRY OF PRESENT ILLNESS:    Hunter [name] is a 72 y.o. male who is status post left superficial femoral, left external iliac, and left common iliac artery stenting for left f second toe ulcer on 03/13/2022.  He was seen in the [facility] clinic 3 months ago and his toe pain had resolved.  Fortunately, his left second toe ulcer has healed although the skin is still very thin over top.  He has been released from the wound center.   The patient has a history of spinal cord injury and is nonambulatory.  He has significant venous insufficiency with edema.  He suffers from coronary artery disease, status post CABG.  He is a former smoker.  He takes a statin for hypercholesterolemia.  PAST MEDICAL HISTORY:   Past Medical History:  Diagnosis Date   (HFpEF) heart failure with preserved ejection fraction (HCC)    Echo 5/22: EF 50-55, no RWMA, moderate LVH, GRII DD, normal RVSF, trivial MR   Bilateral lower extremity edema    CAD (coronary artery disease) cardiologist-  dr Anne Fu   a. Remote MI with stent in 2005;  b. CABG x 5 in 2005;  c. 09/2010 Cath: LM nl, LAD 70p, 90-18m, D1 80p, LCX 68m, OM1 100, OM2 90ost, OM3 80, RCA 24m, VG->PDA->RPL nl, VG->Diag nl, VG->OM1 nl, LIMA->LAD nl, EF 60%-->Med Rx.// Myoview 5/22: EF 46, mod inf infarct, no ischemia    Carotid artery disease (HCC)    Chronic venous insufficiency    post vein harvest for CABG   COPD (chronic obstructive pulmonary disease) (HCC)    Diabetes mellitus type 2, controlled (HCC)    Difficult intravenous access 06/16/2015   multiple attempt IV starts until use of scanner   ED (erectile dysfunction) of organic origin    Foot drop, bilateral    GERD (gastroesophageal reflux disease)    H/O spinal cord injury    a. more or less wheelchair bound.  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-28-2003   Type 2 diabetes mellitus (HCC)    Urinary incontinence, urge    Wheelchair bound      FAMILY HISTORY:   Family History  Problem Relation Age of Onset   Other Father        WORK ACCIDENT   Heart disease Father        Heart Disease before age 69   Heart  attack Mother    Heart disease Mother        before age 24   Diabetes Mother    Colon cancer Mother    Heart disease Brother        before age 72   Diabetes Brother    Heart attack Brother    Suicidality Brother    Coronary artery disease Sister        CABG   Heart disease Sister    Diabetes Sister    Heart attack Sister    Lung cancer Sister    COPD Sister    Coronary artery disease Brother     SOCIAL HISTORY:   Social History   Tobacco Use   Smoking status: Former    Current packs/day: 0.00    Average packs/day: 0.5 packs/day for 40.0 years (20.0 ttl pk-yrs)    Types: Cigarettes    Start date: 01/01/1969    Quit date: 01/01/2009    Years since quitting: 13.5    Passive exposure: Never   Smokeless tobacco: Never   Tobacco comments:    as of 06-09-2015 per pt last month lite smoker  Substance Use Topics   Alcohol use: No    Alcohol/week: 0.0 standard drinks of  alcohol     ALLERGIES:   Allergies  Allergen Reactions   Chantix [Varenicline] Shortness Of Breath   Codeine Nausea And Vomiting and Other (See Comments)    Severe stomach cramps   Amitriptyline Hcl     Unknown   Atorvastatin     Increase LFT's   Glucophage [Metformin] Diarrhea and Nausea And Vomiting   Ranolazine     "nauseated and dizziness"   Lisinopril Cough   Ramipril Cough   Tape Other (See Comments)    Only plastic tape--can use paper tape OK. Blisters     CURRENT MEDICATIONS:   Current Outpatient Medications  Medication Sig Dispense Refill   albuterol (PROVENTIL) (2.5 MG/3ML) 0.083% nebulizer solution USE 1 VIAL IN NEBULIZER EVERY 6 HOURS - and as needed DX: J44.9 (Patient taking differently: Take 2.5 mg by nebulization 2 (two) times daily.) 360 mL 11   albuterol (VENTOLIN HFA) 108 (90 Base) MCG/ACT inhaler Inhale 2 puffs into the lungs every 4-6 hours as needed. 6.7 g 5   alum hydroxide-mag trisilicate (GAVISCON) 80-20 MG CHEW chewable tablet Chew 2 tablets by mouth 3 (three) times daily as needed for indigestion or heartburn.     amLODipine (NORVASC) 5 MG tablet Take 1 tablet (5 mg total) by mouth daily. 90 tablet 3   aspirin 81 MG tablet Take 81 mg by mouth at bedtime.     Budeson-Glycopyrrol-Formoterol (BREZTRI AEROSPHERE) 160-9-4.8 MCG/ACT AERO Inhale 2 puffs into the lungs in the morning and at bedtime. 10.7 g 11   ciprofloxacin (CIPRO) 500 MG tablet Take 1 tablet (500 mg total) by mouth 2 (two) times daily for 7 days as directed. 14 tablet 0   clopidogrel (PLAVIX) 75 MG tablet Take 1 tablet (75 mg total) by mouth daily. 90 tablet 3   collagenase (SANTYL) 250 UNIT/GM ointment Apply 1 Application topically daily. 30 g 0   Continuous Blood Gluc Sensor (FREESTYLE LIBRE 3 SENSOR) MISC Change sensor every 14 days to monitor blood glucose continuously 6 each 3   Continuous Glucose Sensor (FREESTYLE LIBRE 3 SENSOR) MISC Change sensor every 14 days to monitor blood  glucose continuously. 6 each 3   doxazosin (CARDURA) 2 MG tablet Take 1 tablet (2 mg total) by mouth daily.  90 tablet 3   doxycycline (ADOXA) 100 MG tablet Take 100 mg by mouth 2 (two) times daily.     ergocalciferol (VITAMIN D2) 1.25 MG (50000 UT) capsule Take 1 capsule (50,000 Units total) by mouth 2 (two) times a week, every wednesday and sunday 24 capsule 3   fluticasone (CUTIVATE) 0.005 % ointment Apply 1 application externally 2 (two) times daily as needed for irritation, not near eyes for 14 days 30 g 1   furosemide (LASIX) 40 MG tablet Take 1 tablet (40 mg total) by mouth 2 (two) times daily. (Patient taking differently: Take 20 mg by mouth See admin instructions. Take 20 mg daily, may take a second 20 mg dose as needed for swelling) 180 tablet 3   gabapentin (NEURONTIN) 300 MG capsule Take 1 capsule (300 mg total) by mouth in the morning AND 2 capsules (600 mg total) every evening. 270 capsule 1   glucose blood (ONETOUCH VERIO) test strip USE TO SELF MONITOR BLOOD GLUCOSE UP TO 4 TIMES DAILY (E11.65) 37 100 days 400 strip 3   insulin degludec (TRESIBA FLEXTOUCH) 200 UNIT/ML FlexTouch Pen Inject 66 Units into the skin daily. 9 mL 5   insulin lispro (HUMALOG KWIKPEN) 100 UNIT/ML KwikPen Inject 30 Units with each meal into the skin 3 (three) times daily 60 mL 5   Insulin Pen Needle (TECHLITE PEN NEEDLES) 32G X 4 MM MISC Use  3 times daily with Humalog and 1 time daily with Tresiba 400 each 5   ipratropium (ATROVENT) 0.06 % nasal spray Place 1 spray into both nostrils daily as needed for rhinitis.     ipratropium (ATROVENT) 0.06 % nasal spray Place 1-2 sprays into each nostril 3 (three) times daily as needed for CONGESTION 45 mL 1   ketotifen (ZADITOR) 0.025 % ophthalmic solution Place 2 drops as needed into both eyes (for allergies).      loperamide (IMODIUM A-D) 2 MG tablet Take 1 mg by mouth at bedtime.     methocarbamol (ROBAXIN) 500 MG tablet Take 1 tablet (500 mg total) by mouth every  evening. 90 tablet 3   metoprolol tartrate (LOPRESSOR) 50 MG tablet Take 1 tablet (50 mg total) by mouth 2 (two) times daily. 180 tablet 3   montelukast (SINGULAIR) 10 MG tablet TAKE 1 TABLET BY MOUTH EVERYDAY AT BEDTIME 90 tablet 0   nitroGLYCERIN (NITROSTAT) 0.4 MG SL tablet Place 1 tablet (0.4 mg total) under the tongue every 5 (five) minutes as needed for chest pain 25 tablet 5   NOVOFINE 32G X 6 MM MISC USE ONCE DAILY WITH LEVEMIR PEN  1   omeprazole (PRILOSEC) 40 MG capsule Take 1 capsule (40 mg total) by mouth 2 (two) times daily. 180 capsule 4   ONETOUCH VERIO test strip USE TO SELF MONITOR BLOOD GLUCOSE UP TO 4 TIMES DAILY (E11.65)  4   Polyvinyl Alcohol-Povidone (REFRESH OP) Place 1 drop into both eyes daily as needed (dry eyes).     potassium chloride SA (KLOR-CON M20) 20 MEQ tablet Take 1 tablet (20 mEq total) by mouth daily. 90 tablet 3   PROAIR HFA 108 (90 Base) MCG/ACT inhaler Inhale 2 puffs into the lungs every 4 (four) hours as needed for shortness of breath or wheezing.  3   Psyllium (METAMUCIL PO) Take 1 Scoop by mouth 2 (two) times daily.     Respiratory Therapy Supplies (FLUTTER) DEVI Use as directed 1 each 0   rosuvastatin (CRESTOR) 20 MG tablet Take 1 tablet (20 mg  total) by mouth daily. 90 tablet 3   Semaglutide, 1 MG/DOSE, (OZEMPIC, 1 MG/DOSE,) 4 MG/3ML SOPN Inject 1 mg into the skin once a week. 3 mL 11   sodium chloride HYPERTONIC 3 % nebulizer solution 4 mL inhaled via nebulizer twice daily. 240 mL 3   traMADol (ULTRAM) 50 MG tablet Take 1 tablet (50 mg total) 3 (three) times daily as needed by mouth for moderate pain. 30 tablet 4   traMADol (ULTRAM) 50 MG tablet Take 1 tablet (50 mg total) by mouth 3 (three) times daily as needed for pain 90 tablet 0   triamterene-hydrochlorothiazide (MAXZIDE-25) 37.5-25 MG tablet Take 1 tablet by mouth daily. 90 tablet 3   No current facility-administered medications for this visit.    REVIEW OF SYSTEMS:   [X]  denotes positive  finding, [ ]  denotes negative finding Cardiac  Comments:  Chest pain or chest pressure:    Shortness of breath upon exertion:    Short of breath when lying flat:    Irregular heart rhythm:        Vascular    Pain in calf, thigh, or hip brought on by ambulation:    Pain in feet at night that wakes you up from your sleep:     Blood clot in your veins:    Leg swelling:         Pulmonary    Oxygen at home:    Productive cough:     Wheezing:         Neurologic    Sudden weakness in arms or legs:     Sudden numbness in arms or legs:     Sudden onset of difficulty speaking or slurred speech:    Temporary loss of vision in one eye:     Problems with dizziness:         Gastrointestinal    Blood in stool:     Vomited blood:         Genitourinary    Burning when urinating:     Blood in urine:        Psychiatric    Major depression:         Hematologic    Bleeding problems:    Problems with blood clotting too easily:        Skin    Rashes or ulcers:        Constitutional    Fever or chills:      PHYSICAL EXAM:   Vitals:   07/16/22 1043  BP: (!) 143/77  Pulse: 72  Resp: 20  Temp: 98.2 F (36.8 C)  SpO2: 96%  Weight: 230 lb (104.3 kg)  Height: 5\' 6"  (1.676 m)    GENERAL: The patient is a well-nourished male, in no acute distress. The vital signs are documented above. CARDIAC: There is a regular rate and rhythm.  VASCULAR: Significant left leg edema.  No palpable pedal pulse PULMONARY: Non-labored respirations MUSCULOSKELETAL: There are no major deformities or cyanosis. NEUROLOGIC: No focal weakness or paresthesias are detected. SKIN: There are no ulcers or rashes noted. PSYCHIATRIC: The patient has a normal affect.  STUDIES:   Reviewed the following vascular studies: Stenosis: +-------------------+---------------+  Location           Stent            +-------------------+---------------+  Left Common Iliac  50-99% stenosis   +-------------------+---------------+  Left External Iliac50-99% stenosis  +-------------------+---------------+   +-------+-----------+-----------+------------+------------+  ABI/TBIToday's ABIToday's TBIPrevious ABIPrevious TBI  +-------+-----------+-----------+------------+------------+  Right  [place]         0.35       [place]          0.41          +-------+-----------+-----------+------------+------------+  Left  [place]         0.62       [place]          0.74          +-------+-----------+-----------+------------+------------+  Right toe pressure: 66 Left toe pressure: 116   Left: Patent stent with inflow velocities suggestive of a 50-99% stenosis.  Proximal end of stent velocities sugest 50-99% stenosis (lowest end of  range).    MEDICAL ISSUES:   PAD with ulcer: Fortunately, the patient has been able to heal his wound.  He has had significant stenosis in the left common and external iliac artery as well as the left superficial femoral artery stents.  I am concerned that if we do not evaluate this, it could lead to stent occlusion.  Therefore, I have recommended proceeding with repeat angiography via a right femoral approach and intervention with atherectomy and angioplasty if ultrasound findings of in-stent stenosis are confirmed.    Charlena Cross, MD, FACS [facility] [phone] Pager [phone]

## 2022-07-16 NOTE — Progress Notes (Signed)
[facility] [place]  Patient name: Hunter [name] MRN: [id number] DOB: [birth year] Sex: male   REASON FOR VISIT:    Follow up  HISOTRY OF PRESENT ILLNESS:    Hunter [name] is a 72 y.o. male who is status post left superficial femoral, left external iliac, and left common iliac artery stenting for left f second toe ulcer on 03/13/2022.  He was seen in the [facility] clinic 3 months ago and his toe pain had resolved.  Fortunately, his left second toe ulcer has healed although the skin is still very thin over top.  He has been released from the wound center.   The patient has a history of spinal cord injury and is nonambulatory.  He has significant venous insufficiency with edema.  He suffers from coronary artery disease, status post CABG.  He is a former smoker.  He takes a statin for hypercholesterolemia.  PAST MEDICAL HISTORY:   Past Medical History:  Diagnosis Date   (HFpEF) heart failure with preserved ejection fraction (HCC)    Echo 5/22: EF 50-55, no RWMA, moderate LVH, GRII DD, normal RVSF, trivial MR   Bilateral lower extremity edema    CAD (coronary artery disease) cardiologist-  dr Anne Fu   a. Remote MI with stent in 2005;  b. CABG x 5 in 2005;  c. 09/2010 Cath: LM nl, LAD 70p, 90-18m, D1 80p, LCX 68m, OM1 100, OM2 90ost, OM3 80, RCA 24m, VG->PDA->RPL nl, VG->Diag nl, VG->OM1 nl, LIMA->LAD nl, EF 60%-->Med Rx.// Myoview 5/22: EF 46, mod inf infarct, no ischemia    Carotid artery disease (HCC)    Chronic venous insufficiency    post vein harvest for CABG   COPD (chronic obstructive pulmonary disease) (HCC)    Diabetes mellitus type 2, controlled (HCC)    Difficult intravenous access 06/16/2015   multiple attempt IV starts until use of scanner   ED (erectile dysfunction) of organic origin    Foot drop, bilateral    GERD (gastroesophageal reflux disease)    H/O spinal cord injury    a. more or less wheelchair bound.  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-28-2003   Type 2 diabetes mellitus (HCC)    Urinary incontinence, urge    Wheelchair bound      FAMILY HISTORY:   Family History  Problem Relation Age of Onset   Other Father        WORK ACCIDENT   Heart disease Father        Heart Disease before age 69   Heart  attack Mother    Heart disease Mother        before age 24   Diabetes Mother    Colon cancer Mother    Heart disease Brother        before age 72   Diabetes Brother    Heart attack Brother    Suicidality Brother    Coronary artery disease Sister        CABG   Heart disease Sister    Diabetes Sister    Heart attack Sister    Lung cancer Sister    COPD Sister    Coronary artery disease Brother     SOCIAL HISTORY:   Social History   Tobacco Use   Smoking status: Former    Current packs/day: 0.00    Average packs/day: 0.5 packs/day for 40.0 years (20.0 ttl pk-yrs)    Types: Cigarettes    Start date: 01/01/1969    Quit date: 01/01/2009    Years since quitting: 13.5    Passive exposure: Never   Smokeless tobacco: Never   Tobacco comments:    as of 06-09-2015 per pt last month lite smoker  Substance Use Topics   Alcohol use: No    Alcohol/week: 0.0 standard drinks of  alcohol     ALLERGIES:   Allergies  Allergen Reactions   Chantix [Varenicline] Shortness Of Breath   Codeine Nausea And Vomiting and Other (See Comments)    Severe stomach cramps   Amitriptyline Hcl     Unknown   Atorvastatin     Increase LFT's   Glucophage [Metformin] Diarrhea and Nausea And Vomiting   Ranolazine     "nauseated and dizziness"   Lisinopril Cough   Ramipril Cough   Tape Other (See Comments)    Only plastic tape--can use paper tape OK. Blisters     CURRENT MEDICATIONS:   Current Outpatient Medications  Medication Sig Dispense Refill   albuterol (PROVENTIL) (2.5 MG/3ML) 0.083% nebulizer solution USE 1 VIAL IN NEBULIZER EVERY 6 HOURS - and as needed DX: J44.9 (Patient taking differently: Take 2.5 mg by nebulization 2 (two) times daily.) 360 mL 11   albuterol (VENTOLIN HFA) 108 (90 Base) MCG/ACT inhaler Inhale 2 puffs into the lungs every 4-6 hours as needed. 6.7 g 5   alum hydroxide-mag trisilicate (GAVISCON) 80-20 MG CHEW chewable tablet Chew 2 tablets by mouth 3 (three) times daily as needed for indigestion or heartburn.     amLODipine (NORVASC) 5 MG tablet Take 1 tablet (5 mg total) by mouth daily. 90 tablet 3   aspirin 81 MG tablet Take 81 mg by mouth at bedtime.     Budeson-Glycopyrrol-Formoterol (BREZTRI AEROSPHERE) 160-9-4.8 MCG/ACT AERO Inhale 2 puffs into the lungs in the morning and at bedtime. 10.7 g 11   ciprofloxacin (CIPRO) 500 MG tablet Take 1 tablet (500 mg total) by mouth 2 (two) times daily for 7 days as directed. 14 tablet 0   clopidogrel (PLAVIX) 75 MG tablet Take 1 tablet (75 mg total) by mouth daily. 90 tablet 3   collagenase (SANTYL) 250 UNIT/GM ointment Apply 1 Application topically daily. 30 g 0   Continuous Blood Gluc Sensor (FREESTYLE LIBRE 3 SENSOR) MISC Change sensor every 14 days to monitor blood glucose continuously 6 each 3   Continuous Glucose Sensor (FREESTYLE LIBRE 3 SENSOR) MISC Change sensor every 14 days to monitor blood  glucose continuously. 6 each 3   doxazosin (CARDURA) 2 MG tablet Take 1 tablet (2 mg total) by mouth daily.  90 tablet 3   doxycycline (ADOXA) 100 MG tablet Take 100 mg by mouth 2 (two) times daily.     ergocalciferol (VITAMIN D2) 1.25 MG (50000 UT) capsule Take 1 capsule (50,000 Units total) by mouth 2 (two) times a week, every wednesday and sunday 24 capsule 3   fluticasone (CUTIVATE) 0.005 % ointment Apply 1 application externally 2 (two) times daily as needed for irritation, not near eyes for 14 days 30 g 1   furosemide (LASIX) 40 MG tablet Take 1 tablet (40 mg total) by mouth 2 (two) times daily. (Patient taking differently: Take 20 mg by mouth See admin instructions. Take 20 mg daily, may take a second 20 mg dose as needed for swelling) 180 tablet 3   gabapentin (NEURONTIN) 300 MG capsule Take 1 capsule (300 mg total) by mouth in the morning AND 2 capsules (600 mg total) every evening. 270 capsule 1   glucose blood (ONETOUCH VERIO) test strip USE TO SELF MONITOR BLOOD GLUCOSE UP TO 4 TIMES DAILY (E11.65) 37 100 days 400 strip 3   insulin degludec (TRESIBA FLEXTOUCH) 200 UNIT/ML FlexTouch Pen Inject 66 Units into the skin daily. 9 mL 5   insulin lispro (HUMALOG KWIKPEN) 100 UNIT/ML KwikPen Inject 30 Units with each meal into the skin 3 (three) times daily 60 mL 5   Insulin Pen Needle (TECHLITE PEN NEEDLES) 32G X 4 MM MISC Use  3 times daily with Humalog and 1 time daily with Tresiba 400 each 5   ipratropium (ATROVENT) 0.06 % nasal spray Place 1 spray into both nostrils daily as needed for rhinitis.     ipratropium (ATROVENT) 0.06 % nasal spray Place 1-2 sprays into each nostril 3 (three) times daily as needed for CONGESTION 45 mL 1   ketotifen (ZADITOR) 0.025 % ophthalmic solution Place 2 drops as needed into both eyes (for allergies).      loperamide (IMODIUM A-D) 2 MG tablet Take 1 mg by mouth at bedtime.     methocarbamol (ROBAXIN) 500 MG tablet Take 1 tablet (500 mg total) by mouth every  evening. 90 tablet 3   metoprolol tartrate (LOPRESSOR) 50 MG tablet Take 1 tablet (50 mg total) by mouth 2 (two) times daily. 180 tablet 3   montelukast (SINGULAIR) 10 MG tablet TAKE 1 TABLET BY MOUTH EVERYDAY AT BEDTIME 90 tablet 0   nitroGLYCERIN (NITROSTAT) 0.4 MG SL tablet Place 1 tablet (0.4 mg total) under the tongue every 5 (five) minutes as needed for chest pain 25 tablet 5   NOVOFINE 32G X 6 MM MISC USE ONCE DAILY WITH LEVEMIR PEN  1   omeprazole (PRILOSEC) 40 MG capsule Take 1 capsule (40 mg total) by mouth 2 (two) times daily. 180 capsule 4   ONETOUCH VERIO test strip USE TO SELF MONITOR BLOOD GLUCOSE UP TO 4 TIMES DAILY (E11.65)  4   Polyvinyl Alcohol-Povidone (REFRESH OP) Place 1 drop into both eyes daily as needed (dry eyes).     potassium chloride SA (KLOR-CON M20) 20 MEQ tablet Take 1 tablet (20 mEq total) by mouth daily. 90 tablet 3   PROAIR HFA 108 (90 Base) MCG/ACT inhaler Inhale 2 puffs into the lungs every 4 (four) hours as needed for shortness of breath or wheezing.  3   Psyllium (METAMUCIL PO) Take 1 Scoop by mouth 2 (two) times daily.     Respiratory Therapy Supplies (FLUTTER) DEVI Use as directed 1 each 0   rosuvastatin (CRESTOR) 20 MG tablet Take 1 tablet (20 mg  total) by mouth daily. 90 tablet 3   Semaglutide, 1 MG/DOSE, (OZEMPIC, 1 MG/DOSE,) 4 MG/3ML SOPN Inject 1 mg into the skin once a week. 3 mL 11   sodium chloride HYPERTONIC 3 % nebulizer solution 4 mL inhaled via nebulizer twice daily. 240 mL 3   traMADol (ULTRAM) 50 MG tablet Take 1 tablet (50 mg total) 3 (three) times daily as needed by mouth for moderate pain. 30 tablet 4   traMADol (ULTRAM) 50 MG tablet Take 1 tablet (50 mg total) by mouth 3 (three) times daily as needed for pain 90 tablet 0   triamterene-hydrochlorothiazide (MAXZIDE-25) 37.5-25 MG tablet Take 1 tablet by mouth daily. 90 tablet 3   No current facility-administered medications for this visit.    REVIEW OF SYSTEMS:   [X]  denotes positive  finding, [ ]  denotes negative finding Cardiac  Comments:  Chest pain or chest pressure:    Shortness of breath upon exertion:    Short of breath when lying flat:    Irregular heart rhythm:        Vascular    Pain in calf, thigh, or hip brought on by ambulation:    Pain in feet at night that wakes you up from your sleep:     Blood clot in your veins:    Leg swelling:         Pulmonary    Oxygen at home:    Productive cough:     Wheezing:         Neurologic    Sudden weakness in arms or legs:     Sudden numbness in arms or legs:     Sudden onset of difficulty speaking or slurred speech:    Temporary loss of vision in one eye:     Problems with dizziness:         Gastrointestinal    Blood in stool:     Vomited blood:         Genitourinary    Burning when urinating:     Blood in urine:        Psychiatric    Major depression:         Hematologic    Bleeding problems:    Problems with blood clotting too easily:        Skin    Rashes or ulcers:        Constitutional    Fever or chills:      PHYSICAL EXAM:   Vitals:   07/16/22 1043  BP: (!) 143/77  Pulse: 72  Resp: 20  Temp: 98.2 F (36.8 C)  SpO2: 96%  Weight: 230 lb (104.3 kg)  Height: 5\' 6"  (1.676 m)    GENERAL: The patient is a well-nourished male, in no acute distress. The vital signs are documented above. CARDIAC: There is a regular rate and rhythm.  VASCULAR: Significant left leg edema.  No palpable pedal pulse PULMONARY: Non-labored respirations MUSCULOSKELETAL: There are no major deformities or cyanosis. NEUROLOGIC: No focal weakness or paresthesias are detected. SKIN: There are no ulcers or rashes noted. PSYCHIATRIC: The patient has a normal affect.  STUDIES:   Reviewed the following vascular studies: Stenosis: +-------------------+---------------+  Location           Stent            +-------------------+---------------+  Left Common Iliac  50-99% stenosis   +-------------------+---------------+  Left External Iliac50-99% stenosis  +-------------------+---------------+   +-------+-----------+-----------+------------+------------+  ABI/TBIToday's ABIToday's TBIPrevious ABIPrevious TBI  +-------+-----------+-----------+------------+------------+  Right  [place]         0.35       [place]          0.41          +-------+-----------+-----------+------------+------------+  Left  [place]         0.62       [place]          0.74          +-------+-----------+-----------+------------+------------+  Right toe pressure: 66 Left toe pressure: 116   Left: Patent stent with inflow velocities suggestive of a 50-99% stenosis.  Proximal end of stent velocities sugest 50-99% stenosis (lowest end of  range).    MEDICAL ISSUES:   PAD with ulcer: Fortunately, the patient has been able to heal his wound.  He has had significant stenosis in the left common and external iliac artery as well as the left superficial femoral artery stents.  I am concerned that if we do not evaluate this, it could lead to stent occlusion.  Therefore, I have recommended proceeding with repeat angiography via a right femoral approach and intervention with atherectomy and angioplasty if ultrasound findings of in-stent stenosis are confirmed.    Charlena Cross, MD, FACS [facility] [phone] Pager [phone]

## 2022-07-19 ENCOUNTER — Other Ambulatory Visit (HOSPITAL_COMMUNITY): Payer: Self-pay

## 2022-07-19 MED ORDER — CIPROFLOXACIN HCL 500 MG PO TABS
500.0000 mg | ORAL_TABLET | Freq: Two times a day (BID) | ORAL | 0 refills | Status: DC
  Filled 2022-07-19: qty 6, 3d supply, fill #0

## 2022-07-21 ENCOUNTER — Other Ambulatory Visit (HOSPITAL_COMMUNITY): Payer: Self-pay

## 2022-07-23 ENCOUNTER — Other Ambulatory Visit: Payer: Self-pay

## 2022-07-23 ENCOUNTER — Other Ambulatory Visit (HOSPITAL_COMMUNITY): Payer: Self-pay

## 2022-07-23 ENCOUNTER — Telehealth: Payer: Self-pay

## 2022-07-23 DIAGNOSIS — I70245 Atherosclerosis of native arteries of left leg with ulceration of other part of foot: Secondary | ICD-10-CM

## 2022-07-23 DIAGNOSIS — I739 Peripheral vascular disease, unspecified: Secondary | ICD-10-CM

## 2022-07-23 MED ORDER — TRAMADOL HCL 50 MG PO TABS
50.0000 mg | ORAL_TABLET | Freq: Three times a day (TID) | ORAL | 2 refills | Status: DC | PRN
Start: 2022-07-23 — End: 2023-04-08
  Filled 2022-07-23: qty 90, 30d supply, fill #0
  Filled 2022-09-10: qty 90, 30d supply, fill #1
  Filled 2022-11-10: qty 90, 30d supply, fill #2

## 2022-07-23 NOTE — Telephone Encounter (Signed)
Called pt to schedule his AGM. He put his wife on the phone to schedule this, as she will be bringing him. All pre op instructions provided, including Semaglutide hold and taking half of his insulin. She verbalized understanding, no questions/concerns at this time.

## 2022-07-24 ENCOUNTER — Other Ambulatory Visit (HOSPITAL_COMMUNITY): Payer: Self-pay

## 2022-08-01 ENCOUNTER — Ambulatory Visit: Payer: Medicare Other | Admitting: Podiatry

## 2022-08-07 ENCOUNTER — Ambulatory Visit (HOSPITAL_COMMUNITY)
Admission: RE | Admit: 2022-08-07 | Disposition: A | Discharge status: Home / Self Care | Payer: Medicare Other | Source: Home / Self Care | Admitting: Surgery

## 2022-08-07 ENCOUNTER — Other Ambulatory Visit: Payer: Self-pay

## 2022-08-07 ENCOUNTER — Encounter (HOSPITAL_COMMUNITY)
Admission: RE | Disposition: A | Discharge status: Home / Self Care | Payer: Self-pay | Source: Home / Self Care | Attending: Surgery

## 2022-08-07 DIAGNOSIS — Y832 Surgical operation with anastomosis, bypass or graft as the cause of abnormal reaction of the patient, or of later complication, without mention of misadventure at the time of the procedure: Secondary | ICD-10-CM | POA: Insufficient documentation

## 2022-08-07 DIAGNOSIS — Z7985 Long-term (current) use of injectable non-insulin antidiabetic drugs: Secondary | ICD-10-CM | POA: Diagnosis not present

## 2022-08-07 DIAGNOSIS — Z951 Presence of aortocoronary bypass graft: Secondary | ICD-10-CM | POA: Insufficient documentation

## 2022-08-07 DIAGNOSIS — T82856A Stenosis of peripheral vascular stent, initial encounter: Secondary | ICD-10-CM | POA: Diagnosis not present

## 2022-08-07 DIAGNOSIS — Z794 Long term (current) use of insulin: Secondary | ICD-10-CM | POA: Insufficient documentation

## 2022-08-07 DIAGNOSIS — E1151 Type 2 diabetes mellitus with diabetic peripheral angiopathy without gangrene: Secondary | ICD-10-CM | POA: Insufficient documentation

## 2022-08-07 DIAGNOSIS — Z95820 Peripheral vascular angioplasty status with implants and grafts: Secondary | ICD-10-CM | POA: Diagnosis not present

## 2022-08-07 DIAGNOSIS — E7800 Pure hypercholesterolemia, unspecified: Secondary | ICD-10-CM | POA: Diagnosis not present

## 2022-08-07 DIAGNOSIS — I739 Peripheral vascular disease, unspecified: Secondary | ICD-10-CM

## 2022-08-07 DIAGNOSIS — I2510 Atherosclerotic heart disease of native coronary artery without angina pectoris: Secondary | ICD-10-CM | POA: Diagnosis not present

## 2022-08-07 DIAGNOSIS — I70245 Atherosclerosis of native arteries of left leg with ulceration of other part of foot: Secondary | ICD-10-CM | POA: Diagnosis not present

## 2022-08-07 DIAGNOSIS — T82858A Stenosis of vascular prosthetic devices, implants and grafts, initial encounter: Secondary | ICD-10-CM

## 2022-08-07 DIAGNOSIS — Z87891 Personal history of nicotine dependence: Secondary | ICD-10-CM | POA: Insufficient documentation

## 2022-08-07 DIAGNOSIS — L97529 Non-pressure chronic ulcer of other part of left foot with unspecified severity: Secondary | ICD-10-CM | POA: Insufficient documentation

## 2022-08-07 DIAGNOSIS — Z79899 Other long term (current) drug therapy: Secondary | ICD-10-CM | POA: Insufficient documentation

## 2022-08-07 DIAGNOSIS — I708 Atherosclerosis of other arteries: Secondary | ICD-10-CM | POA: Diagnosis not present

## 2022-08-07 HISTORY — PX: ABDOMINAL AORTOGRAM W/LOWER EXTREMITY: CATH118223

## 2022-08-07 LAB — POCT I-STAT, CHEM 8
BUN: 27 mg/dL — ABNORMAL HIGH (ref 8–23)
Calcium, Ion: 0.99 mmol/L — ABNORMAL LOW (ref 1.15–1.40)
Chloride: 105 mmol/L (ref 98–111)
Creatinine, Ser: 1.2 mg/dL (ref 0.61–1.24)
Glucose, Bld: 165 mg/dL — ABNORMAL HIGH (ref 70–99)
HCT: 36 % — ABNORMAL LOW (ref 39.0–52.0)
Hemoglobin: 12.2 g/dL — ABNORMAL LOW (ref 13.0–17.0)
Potassium: 4.6 mmol/L (ref 3.5–5.1)
Sodium: 138 mmol/L (ref 135–145)
TCO2: 26 mmol/L (ref 22–32)

## 2022-08-07 LAB — GLUCOSE, CAPILLARY: Glucose-Capillary: 108 mg/dL — ABNORMAL HIGH (ref 70–99)

## 2022-08-07 SURGERY — ABDOMINAL AORTOGRAM W/LOWER EXTREMITY
Anesthesia: LOCAL

## 2022-08-07 MED ORDER — SODIUM CHLORIDE 0.9 % IV SOLN: 250.0000 mL | INTRAVENOUS | Status: DC | PRN

## 2022-08-07 MED ORDER — HYDRALAZINE HCL 20 MG/ML IJ SOLN: 5.0000 mg | INTRAMUSCULAR | Status: DC | PRN

## 2022-08-07 MED ORDER — ONDANSETRON HCL 4 MG/2ML IJ SOLN: 4.0000 mg | Freq: Four times a day (QID) | INTRAMUSCULAR | Status: DC | PRN

## 2022-08-07 MED ORDER — IODIXANOL 320 MG/ML IV SOLN
INTRAVENOUS | Status: DC | PRN
  Administered 2022-08-07: 70 mL

## 2022-08-07 MED ORDER — SODIUM CHLORIDE 0.9% FLUSH: 3.0000 mL | Freq: Two times a day (BID) | INTRAVENOUS | Status: DC

## 2022-08-07 MED ORDER — LABETALOL HCL 5 MG/ML IV SOLN: 10.0000 mg | INTRAVENOUS | Status: DC | PRN

## 2022-08-07 MED ORDER — OXYCODONE HCL 5 MG PO TABS: 5.0000 mg | ORAL_TABLET | ORAL | Status: DC | PRN

## 2022-08-07 MED ORDER — FENTANYL CITRATE (PF) 100 MCG/2ML IJ SOLN
INTRAMUSCULAR | Status: AC
  Filled 2022-08-07: qty 2

## 2022-08-07 MED ORDER — FENTANYL CITRATE (PF) 100 MCG/2ML IJ SOLN
INTRAMUSCULAR | Status: DC | PRN
  Administered 2022-08-07: 50 ug via INTRAVENOUS

## 2022-08-07 MED ORDER — LIDOCAINE HCL (PF) 1 % IJ SOLN
INTRAMUSCULAR | Status: AC
  Filled 2022-08-07: qty 30

## 2022-08-07 MED ORDER — MIDAZOLAM HCL 2 MG/2ML IJ SOLN
INTRAMUSCULAR | Status: AC
  Filled 2022-08-07: qty 2

## 2022-08-07 MED ORDER — SODIUM CHLORIDE 0.9% FLUSH: 3.0000 mL | INTRAVENOUS | Status: DC | PRN

## 2022-08-07 MED ORDER — ACETAMINOPHEN 325 MG PO TABS: 650.0000 mg | ORAL_TABLET | ORAL | Status: DC | PRN

## 2022-08-07 MED ORDER — MIDAZOLAM HCL 2 MG/2ML IJ SOLN
INTRAMUSCULAR | Status: DC | PRN
  Administered 2022-08-07: 2 mg via INTRAVENOUS

## 2022-08-07 MED ORDER — SODIUM CHLORIDE 0.9 % IV SOLN: INTRAVENOUS | Status: DC

## 2022-08-07 MED ORDER — LIDOCAINE HCL (PF) 1 % IJ SOLN
INTRAMUSCULAR | Status: DC | PRN
  Administered 2022-08-07: 15 mL

## 2022-08-07 MED ORDER — HEPARIN (PORCINE) IN NACL 1000-0.9 UT/500ML-% IV SOLN
INTRAVENOUS | Status: DC | PRN
  Administered 2022-08-07 (×2): 500 mL

## 2022-08-07 MED ORDER — SODIUM CHLORIDE 0.9 % WEIGHT BASED INFUSION: 1.0000 mL/kg/h | INTRAVENOUS | Status: AC

## 2022-08-07 SURGICAL SUPPLY — 9 items
CATH OMNI FLUSH 5F 65CM (CATHETERS) IMPLANT
CLOSURE MYNX CONTROL 5F (Vascular Products) IMPLANT
COVER DOME SNAP 22 D (MISCELLANEOUS) IMPLANT
KIT MICROPUNCTURE NIT STIFF (SHEATH) IMPLANT
SET ATX-X65L (MISCELLANEOUS) IMPLANT
SHEATH PINNACLE 5F 10CM (SHEATH) IMPLANT
SHEATH PROBE COVER 6X72 (BAG) IMPLANT
TRAY PV CATH (CUSTOM PROCEDURE TRAY) ×1 IMPLANT
WIRE BENTSON .035X145CM (WIRE) IMPLANT

## 2022-08-07 NOTE — Interval H&P Note (Signed)
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  The various methods of treatment have been discussed with the patient and family. After consideration of risks, benefits and other options for treatment, the patient has consented to  Procedure(s): ABDOMINAL AORTOGRAM W/LOWER EXTREMITY (N/A) as a surgical intervention.  The patient's history has been reviewed, patient examined, no change in status, stable for surgery.  I have reviewed the patient's chart and labs.  Questions were answered to the patient's satisfaction.     Durene Cal

## 2022-08-07 NOTE — Op Note (Signed)
    Patient name: Hunter [name] MRN: [id number] DOB: 1950/12/11 Sex: male  08/07/2022 Pre-operative Diagnosis: in-stent stenosis Post-operative diagnosis:  Same Surgeon:  Durene Cal Procedure Performed:  1.  Ultrasound-guided access, right femoral artery  2.  Abdominal aortogram  3.  Second-order catheterization with contrast injection in the left external iliac artery from the right common femoral artery  4.  Left lower extremity angiogram  5.  Closure device, Mynx  6.  Conscious sedation, 25 minutes   Indications: This is a 72 year old gentleman with history of previous stenting for ulceration.  His ulcers are healed but surveillance imaging suggested in-stent stenosis.  He is here for further evaluation and possible intervention.  Procedure:  The patient was identified in the holding area and taken to room 8.  The patient was then placed supine on the table and prepped and draped in the usual sterile fashion.  A time out was called.  Conscious sedation was administered with the use of IV fentanyl and Versed under continuous physician and nurse monitoring.  Heart rate, blood pressure, and oxygen saturation were continuously monitored.  Total sedation time was 25 minutes.  Ultrasound was used to evaluate the right common femoral artery.  It was patent .  A digital ultrasound image was acquired.  A micropuncture needle was used to access the right common femoral artery under ultrasound guidance.  An 018 wire was advanced without resistance and a micropuncture sheath was placed.  The 018 wire was removed and a benson wire was placed.  The micropuncture sheath was exchanged for a 5 french sheath.  An omniflush catheter was advanced over the wire to the level of L-1.  An abdominal angiogram was obtained.  Next, using the omniflush catheter and a benson wire, the aortic bifurcation was crossed and the catheter was placed into theleft external iliac artery and left runoff was obtained.     Findings:   Aortogram: No significant renal artery stenosis was visualized.  There is luminal narrowing within the infrarenal aorta however there is no significant pressure gradient across this conservatively.  Bilateral common and external iliac arteries are widely patent including the left iliac stents.  Bilateral femoral arteries are patent.  Right Lower Extremity: The visualized portion of the right common femoral artery is widely patent.  Left Lower Extremity: Left common femoral artery widely patent.  Stents in the superficial femoral and popliteal artery are patent.  There is.  Intervention:  Right groin was closed with a Mynx  Impression:  #1  Widely patent left iliac and superficial femoral artery stents  V. Durene Cal, M.D., [facility] [place] Office: [phone] Pager:  [phone]

## 2022-08-07 NOTE — Progress Notes (Signed)
Patient unable to bear weight to ambulation prior to discharge. Right groin site CDI.

## 2022-08-08 ENCOUNTER — Encounter (HOSPITAL_COMMUNITY): Payer: Self-pay | Admitting: Surgery

## 2022-08-10 ENCOUNTER — Ambulatory Visit: Payer: Medicare Other | Admitting: Podiatry

## 2022-08-10 DIAGNOSIS — E1151 Type 2 diabetes mellitus with diabetic peripheral angiopathy without gangrene: Secondary | ICD-10-CM | POA: Diagnosis not present

## 2022-08-10 DIAGNOSIS — L97522 Non-pressure chronic ulcer of other part of left foot with fat layer exposed: Secondary | ICD-10-CM | POA: Diagnosis not present

## 2022-08-10 NOTE — Progress Notes (Signed)
Subjective:  Patient ID: Hunter [name], male    DOB: 11/14/1950,  MRN: [id number]  Chief Complaint  Patient presents with   Foot Ulcer    72 y.o. male presents with the above complaint.  Patient presents for follow-up of left second digit ulceration and one of the wound care center he is here for a checkup.  He states is doing much better.  The ulcer is completely healed   Review of Systems: Negative except as noted in the HPI. Denies N/V/F/Ch.  Past Medical History:  Diagnosis Date   (HFpEF) heart failure with preserved ejection fraction (HCC)    Echo 5/22: EF 50-55, no RWMA, moderate LVH, GRII DD, normal RVSF, trivial MR   Bilateral lower extremity edema    CAD (coronary artery disease) cardiologist-  dr Anne Fu   a. Remote MI with stent in 2005;  b. CABG x 5 in 2005;  c. 09/2010 Cath: LM nl, LAD 70p, 90-4m, D1 80p, LCX 30m, OM1 100, OM2 90ost, OM3 80, RCA 90m, VG->PDA->RPL nl, VG->Diag nl, VG->OM1 nl, LIMA->LAD nl, EF 60%-->Med Rx.// Myoview 5/22: EF 46, mod inf infarct, no ischemia    Carotid artery disease (HCC)    Chronic venous insufficiency    post vein harvest for CABG   COPD (chronic obstructive pulmonary disease) (HCC)    Diabetes mellitus type 2, controlled (HCC)    Difficult intravenous access 06/16/2015   multiple attempt IV starts until use of scanner   ED (erectile dysfunction) of organic origin    Foot drop, bilateral    GERD (gastroesophageal reflux disease)    H/O spinal cord injury    a. more or less wheelchair bound. 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-28-2003   Type 2 diabetes mellitus (HCC)    Urinary incontinence, urge    Wheelchair bound     Current Outpatient Medications:    acetaminophen (TYLENOL) 500 MG tablet, Take 1,000 mg by mouth every 6 (six) hours as needed for moderate pain., Disp: , Rfl:    albuterol (PROVENTIL) (2.5 MG/3ML) 0.083% nebulizer solution, USE 1 VIAL IN NEBULIZER EVERY 6 HOURS - and as needed DX: J44.9 (Patient taking differently: Take 2.5 mg by nebulization 2 (two) times daily.), Disp: 360 mL, Rfl: 11   albuterol (VENTOLIN HFA) 108 (90 Base) MCG/ACT inhaler, Inhale 2 puffs into the lungs every 4-6 hours as needed., Disp: 6.7 g, Rfl: 5   alum hydroxide-mag trisilicate (GAVISCON) 80-20 MG CHEW chewable tablet, Chew 2 tablets by mouth 3 (three) times daily as needed for indigestion or heartburn.,  Disp: , Rfl:    amLODipine (NORVASC) 5 MG tablet, Take 1 tablet (5 mg total) by mouth daily., Disp: 90 tablet, Rfl: 3   aspirin 81 MG tablet, Take 81 mg by mouth at bedtime., Disp: , Rfl:    Budeson-Glycopyrrol-Formoterol (BREZTRI AEROSPHERE) 160-9-4.8 MCG/ACT AERO, Inhale 2 puffs into the lungs in the morning and at bedtime., Disp: 10.7 g, Rfl: 11   cetirizine (ZYRTEC) 10 MG tablet, Take 10 mg by mouth daily., Disp: , Rfl:    ciprofloxacin (CIPRO) 500 MG tablet, Take 1 tablet (500 mg total) by mouth every 12 (twelve) hours for 3 days (Patient not taking: Reported on 08/01/2022), Disp: 6 tablet, Rfl: 0   clopidogrel (PLAVIX) 75 MG tablet, Take 1 tablet (75 mg total) by mouth daily., Disp: 90 tablet, Rfl: 3   Continuous Blood Gluc Sensor (FREESTYLE LIBRE 3 SENSOR) MISC, Change sensor every 14 days to monitor blood glucose  continuously, Disp: 6 each, Rfl: 3   Continuous Glucose Sensor (FREESTYLE LIBRE 3 SENSOR) MISC, Change sensor every 14 days to monitor blood glucose continuously., Disp: 6 each, Rfl: 3   docusate sodium (COLACE) 100 MG capsule, Take 100 mg by mouth daily., Disp: , Rfl:    doxazosin (CARDURA) 2 MG tablet, Take 1 tablet (2 mg total) by mouth daily., Disp: 90 tablet, Rfl: 3   ergocalciferol (VITAMIN D2) 1.25 MG (50000 UT) capsule, Take 1 capsule (50,000 Units total) by mouth 2 (two) times a week, every wednesday and sunday, Disp: 24 capsule, Rfl: 3   fluticasone (CUTIVATE) 0.005 % ointment, Apply 1 application externally 2 (two) times daily as needed for irritation, not near eyes for 14 days (Patient not taking: Reported on 08/01/2022), Disp: 30 g, Rfl: 1   furosemide (LASIX) 40 MG tablet, Take 1 tablet (40 mg total) by mouth 2 (two) times daily. (Patient taking differently: Take 40 mg by mouth daily.), Disp: 180 tablet, Rfl: 3   gabapentin (NEURONTIN) 300 MG capsule, Take 1 capsule (300 mg total) by mouth in the morning AND 2 capsules (600 mg total) every evening., Disp: 270 capsule, Rfl: 1   glucose blood (ONETOUCH VERIO) test strip, USE TO SELF MONITOR BLOOD GLUCOSE UP TO 4 TIMES DAILY (E11.65) 37 100 days, Disp: 400 strip, Rfl: 3   ibuprofen (ADVIL) 200 MG tablet, Take 400 mg by mouth every 6 (six) hours as needed for moderate pain., Disp: , Rfl:    insulin degludec (TRESIBA FLEXTOUCH) 200 UNIT/ML FlexTouch Pen, Inject 66 Units into the skin daily., Disp: 9 mL, Rfl: 5   insulin lispro (HUMALOG KWIKPEN) 100 UNIT/ML KwikPen, Inject 30 Units with each meal into the skin 3 (three) times daily, Disp: 60 mL, Rfl: 5   Insulin Pen Needle (TECHLITE PEN NEEDLES) 32G X 4 MM MISC, Use  3 times daily with Humalog and 1 time daily with Evaristo Bury, Disp: 400 each, Rfl: 5   ipratropium (ATROVENT) 0.06 % nasal spray, Place 1-2 sprays into each nostril 3 (three) times daily as needed for CONGESTION, Disp: 45 mL, Rfl: 1    ketotifen (ZADITOR) 0.025 % ophthalmic solution, Place 2 drops as needed into both eyes (for allergies). , Disp: , Rfl:    loperamide (IMODIUM A-D) 2 MG tablet, Take 1 mg by mouth at bedtime., Disp: , Rfl:    methocarbamol (ROBAXIN) 500 MG tablet, Take 1 tablet (500 mg total) by mouth every evening., Disp: 90 tablet, Rfl: 3   metoprolol tartrate (LOPRESSOR) 50 MG tablet, Take 1 tablet (50 mg total)  by mouth 2 (two) times daily., Disp: 180 tablet, Rfl: 3   nitroGLYCERIN (NITROSTAT) 0.4 MG SL tablet, Place 1 tablet (0.4 mg total) under the tongue every 5 (five) minutes as needed for chest pain, Disp: 25 tablet, Rfl: 5   NOVOFINE 32G X 6 MM MISC, USE ONCE DAILY WITH LEVEMIR PEN, Disp: , Rfl: 1   omeprazole (PRILOSEC) 40 MG capsule, Take 1 capsule (40 mg total) by mouth 2 (two) times daily., Disp: 180 capsule, Rfl: 4   ONETOUCH VERIO test strip, USE TO SELF MONITOR BLOOD GLUCOSE UP TO 4 TIMES DAILY (E11.65), Disp: , Rfl: 4   Polyvinyl Alcohol-Povidone (REFRESH OP), Place 1 drop into both eyes daily as needed (dry eyes)., Disp: , Rfl:    potassium chloride SA (KLOR-CON M20) 20 MEQ tablet, Take 1 tablet (20 mEq total) by mouth daily., Disp: 90 tablet, Rfl: 3   Psyllium (METAMUCIL PO), Take 1 Scoop by mouth 2 (two) times daily., Disp: , Rfl:    Respiratory Therapy Supplies (FLUTTER) DEVI, Use as directed, Disp: 1 each, Rfl: 0   rosuvastatin (CRESTOR) 20 MG tablet, Take 1 tablet (20 mg total) by mouth daily., Disp: 90 tablet, Rfl: 3   Semaglutide, 1 MG/DOSE, (OZEMPIC, 1 MG/DOSE,) 4 MG/3ML SOPN, Inject 1 mg into the skin once a week., Disp: 3 mL, Rfl: 11   traMADol (ULTRAM) 50 MG tablet, Take 1 tablet (50 mg total) by mouth 3 (three) times daily as needed for pain, Disp: 90 tablet, Rfl: 2   triamterene-hydrochlorothiazide (MAXZIDE-25) 37.5-25 MG tablet, Take 1 tablet by mouth daily., Disp: 90 tablet, Rfl: 3  Social History   Tobacco Use  Smoking Status Former   Current packs/day: 0.00   Average  packs/day: 0.5 packs/day for 40.0 years (20.0 ttl pk-yrs)   Types: Cigarettes   Start date: 01/01/1969   Quit date: 01/01/2009   Years since quitting: 13.6   Passive exposure: Never  Smokeless Tobacco Never  Tobacco Comments   as of 06-09-2015 per pt last month lite smoker    Allergies  Allergen Reactions   Chantix [Varenicline] Shortness Of Breath   Codeine Nausea And Vomiting and Other (See Comments)    Severe stomach cramps   Amitriptyline Hcl     Unknown   Atorvastatin     Increase LFT's   Glucophage [Metformin] Diarrhea and Nausea And Vomiting   Ranolazine     "nauseated and dizziness"   Lisinopril Cough   Ramipril Cough   Tape Other (See Comments)    Only plastic tape--can use paper tape OK. Blisters   Objective:  There were no vitals filed for this visit. There is no height or weight on file to calculate BMI. Constitutional Well developed. Well nourished.  Vascular Dorsalis pedis pulses nonpalpable bilaterally. Posterior tibial pulses nonpalpable both bilaterally. Capillary refill normal to all digits.  No cyanosis or clubbing noted. Pedal hair growth normal.  Neurologic Normal speech. Oriented to person, place, and time. Epicritic sensation to light touch grossly present bilaterally.  Dermatologic Left second digit dorsal ulceration completely epithelialized.  No signs of ulceration noted.  No acute complaints.  Orthopedic: Normal joint ROM without pain or crepitus bilaterally. No visible deformities. No bony tenderness.   Radiographs: None Assessment:  No diagnosis found. Plan:  Patient was evaluated and treated and all questions answered.  Left second digit ulceration with fat layer exposed -All questions and concerns were discussed with the patient in extensive detail -Clinically improved considerably.  At this time no further  restrictions.  If any foot and ankle issues on future he will come back and see me.

## 2022-08-30 ENCOUNTER — Telehealth: Payer: Self-pay | Admitting: Pulmonary Disease

## 2022-09-05 ENCOUNTER — Other Ambulatory Visit (HOSPITAL_COMMUNITY): Payer: Self-pay

## 2022-09-05 ENCOUNTER — Encounter (HOSPITAL_COMMUNITY): Payer: Self-pay

## 2022-09-05 MED ORDER — BUDESONIDE-FORMOTEROL FUMARATE 160-4.5 MCG/ACT IN AERO
2.0000 | INHALATION_SPRAY | Freq: Two times a day (BID) | RESPIRATORY_TRACT | 3 refills | Status: DC
  Filled 2022-09-05: qty 30.6, 90d supply, fill #0
  Filled 2023-01-29: qty 10.2, 30d supply, fill #0

## 2022-09-05 NOTE — Telephone Encounter (Signed)
Symbicort rx sent.  Mychart message sent to patient to see if he has a urologist.

## 2022-09-07 ENCOUNTER — Other Ambulatory Visit (HOSPITAL_COMMUNITY): Payer: Self-pay

## 2022-09-10 ENCOUNTER — Other Ambulatory Visit (HOSPITAL_COMMUNITY): Payer: Self-pay

## 2022-09-10 ENCOUNTER — Other Ambulatory Visit: Payer: Self-pay

## 2022-09-10 MED ORDER — GABAPENTIN 300 MG PO CAPS
ORAL_CAPSULE | ORAL | 1 refills | Status: DC
Start: 2022-09-10 — End: 2023-02-27
  Filled 2022-09-10: qty 270, 90d supply, fill #0
  Filled 2022-09-15 – 2022-12-04 (×2): qty 270, 90d supply, fill #1

## 2022-09-10 MED ORDER — OZEMPIC (1 MG/DOSE) 4 MG/3ML ~~LOC~~ SOPN
1.0000 mg | PEN_INJECTOR | SUBCUTANEOUS | 11 refills | Status: DC
Start: 2022-09-10 — End: 2023-09-24
  Filled 2022-09-10: qty 3, 28d supply, fill #0
  Filled 2022-10-08: qty 3, 28d supply, fill #1
  Filled 2022-11-02: qty 3, 28d supply, fill #2
  Filled 2022-12-04: qty 3, 28d supply, fill #3
  Filled 2023-01-01: qty 3, 28d supply, fill #4
  Filled 2023-01-29 – 2023-03-05 (×6): qty 3, 28d supply, fill #5
  Filled 2023-03-25 – 2023-03-30 (×2): qty 3, 28d supply, fill #6
  Filled 2023-05-08: qty 3, 28d supply, fill #7
  Filled 2023-05-30: qty 3, 28d supply, fill #8
  Filled 2023-06-25: qty 3, 28d supply, fill #9
  Filled 2023-07-23: qty 3, 28d supply, fill #10
  Filled 2023-08-15: qty 3, 28d supply, fill #11

## 2022-09-15 ENCOUNTER — Other Ambulatory Visit (HOSPITAL_COMMUNITY): Payer: Self-pay

## 2022-09-16 ENCOUNTER — Other Ambulatory Visit (HOSPITAL_COMMUNITY): Payer: Self-pay

## 2022-09-17 ENCOUNTER — Other Ambulatory Visit: Payer: Self-pay

## 2022-09-17 ENCOUNTER — Other Ambulatory Visit (HOSPITAL_COMMUNITY): Payer: Self-pay

## 2022-09-17 MED ORDER — IPRATROPIUM BROMIDE 0.06 % NA SOLN
1.0000 | Freq: Three times a day (TID) | NASAL | 1 refills | Status: AC | PRN
  Filled 2022-09-17: qty 45, 75d supply, fill #0
  Filled 2023-02-27: qty 45, 75d supply, fill #1

## 2022-09-24 ENCOUNTER — Other Ambulatory Visit (HOSPITAL_COMMUNITY): Payer: Self-pay

## 2022-09-24 ENCOUNTER — Other Ambulatory Visit: Payer: Self-pay

## 2022-09-26 ENCOUNTER — Other Ambulatory Visit (HOSPITAL_COMMUNITY): Payer: Self-pay

## 2022-10-05 ENCOUNTER — Ambulatory Visit: Payer: Medicare Other | Admitting: Pulmonary Disease

## 2022-10-08 ENCOUNTER — Other Ambulatory Visit: Payer: Self-pay

## 2022-10-08 ENCOUNTER — Other Ambulatory Visit (HOSPITAL_COMMUNITY): Payer: Self-pay

## 2022-10-08 MED ORDER — TRESIBA FLEXTOUCH 200 UNIT/ML ~~LOC~~ SOPN
66.0000 [IU] | PEN_INJECTOR | Freq: Every day | SUBCUTANEOUS | 5 refills | Status: DC
  Filled 2022-10-08: qty 27, 75d supply, fill #0
  Filled 2022-12-04: qty 27, 75d supply, fill #1
  Filled 2023-01-31 – 2023-02-27 (×2): qty 27, 75d supply, fill #2
  Filled 2023-05-30: qty 27, 75d supply, fill #3
  Filled 2023-08-15: qty 27, 75d supply, fill #4

## 2022-10-09 ENCOUNTER — Other Ambulatory Visit (HOSPITAL_COMMUNITY): Payer: Self-pay

## 2022-11-05 ENCOUNTER — Other Ambulatory Visit (HOSPITAL_COMMUNITY): Payer: Self-pay

## 2022-11-10 ENCOUNTER — Other Ambulatory Visit (HOSPITAL_COMMUNITY): Payer: Self-pay

## 2022-11-10 ENCOUNTER — Other Ambulatory Visit: Payer: Self-pay | Admitting: Cardiology

## 2022-11-12 ENCOUNTER — Other Ambulatory Visit (HOSPITAL_COMMUNITY): Payer: Self-pay

## 2022-11-12 ENCOUNTER — Other Ambulatory Visit: Payer: Self-pay

## 2022-11-12 MED ORDER — ALBUTEROL SULFATE HFA 108 (90 BASE) MCG/ACT IN AERS
2.0000 | INHALATION_SPRAY | RESPIRATORY_TRACT | 5 refills | Status: DC | PRN
  Filled 2022-11-12: qty 6.7, 17d supply, fill #0
  Filled 2023-02-27: qty 6.7, 17d supply, fill #1
  Filled 2023-07-23: qty 6.7, 17d supply, fill #2
  Filled 2023-08-29: qty 6.7, 17d supply, fill #3
  Filled 2023-10-15: qty 6.7, 17d supply, fill #4

## 2022-11-12 MED ORDER — FUROSEMIDE 40 MG PO TABS
40.0000 mg | ORAL_TABLET | Freq: Two times a day (BID) | ORAL | 0 refills | Status: DC
  Filled 2022-11-12: qty 60, 30d supply, fill #0

## 2022-11-13 ENCOUNTER — Other Ambulatory Visit (HOSPITAL_COMMUNITY): Payer: Self-pay

## 2022-11-13 ENCOUNTER — Encounter: Payer: Self-pay | Admitting: Podiatry

## 2022-11-13 ENCOUNTER — Ambulatory Visit: Payer: Medicare Other | Admitting: Podiatry

## 2022-11-13 DIAGNOSIS — D689 Coagulation defect, unspecified: Secondary | ICD-10-CM

## 2022-11-13 DIAGNOSIS — E1151 Type 2 diabetes mellitus with diabetic peripheral angiopathy without gangrene: Secondary | ICD-10-CM | POA: Diagnosis not present

## 2022-11-13 DIAGNOSIS — M79676 Pain in unspecified toe(s): Secondary | ICD-10-CM | POA: Diagnosis not present

## 2022-11-13 DIAGNOSIS — B351 Tinea unguium: Secondary | ICD-10-CM | POA: Diagnosis not present

## 2022-11-13 NOTE — Progress Notes (Signed)
He presents today for toenail debridement.  He has healed second digit of his right foot after revascularization of his left leg and wound Canthacur therapy.  Objective: Toenails are long thick yellow dystrophic clinically mycotic.  No open lesions or wounds.  Pulses are nonpalpable feet are warm to the touch.  Assessment: Pain limb secondary to onychomycosis.  Plan: Debridement toenails 1 through 5 bilateral.

## 2022-12-04 ENCOUNTER — Other Ambulatory Visit (HOSPITAL_COMMUNITY): Payer: Self-pay

## 2022-12-04 ENCOUNTER — Other Ambulatory Visit: Payer: Self-pay | Admitting: Cardiology

## 2022-12-04 ENCOUNTER — Other Ambulatory Visit: Payer: Self-pay

## 2022-12-04 DIAGNOSIS — I25118 Atherosclerotic heart disease of native coronary artery with other forms of angina pectoris: Secondary | ICD-10-CM

## 2022-12-05 ENCOUNTER — Other Ambulatory Visit (HOSPITAL_COMMUNITY): Payer: Self-pay

## 2022-12-05 ENCOUNTER — Other Ambulatory Visit: Payer: Self-pay

## 2022-12-05 MED ORDER — INSULIN LISPRO (1 UNIT DIAL) 100 UNIT/ML (KWIKPEN)
30.0000 [IU] | PEN_INJECTOR | Freq: Three times a day (TID) | SUBCUTANEOUS | 5 refills | Status: DC
  Filled 2022-12-05: qty 60, 67d supply, fill #0
  Filled 2023-01-29: qty 60, 67d supply, fill #1
  Filled 2023-05-03: qty 60, 67d supply, fill #2
  Filled 2023-07-23: qty 60, 67d supply, fill #3
  Filled 2023-09-24: qty 60, 67d supply, fill #4

## 2022-12-05 MED ORDER — VITAMIN D (ERGOCALCIFEROL) 1.25 MG (50000 UNIT) PO CAPS
50000.0000 [IU] | ORAL_CAPSULE | ORAL | 3 refills | Status: DC
  Filled 2022-12-05 – 2022-12-10 (×2): qty 24, 84d supply, fill #0
  Filled 2023-03-25: qty 24, 84d supply, fill #1
  Filled 2023-06-25: qty 24, 84d supply, fill #2
  Filled 2023-09-24: qty 24, 84d supply, fill #3

## 2022-12-06 ENCOUNTER — Other Ambulatory Visit (HOSPITAL_COMMUNITY): Payer: Self-pay

## 2022-12-06 ENCOUNTER — Encounter (HOSPITAL_COMMUNITY): Payer: Self-pay

## 2022-12-06 MED ORDER — TRAMADOL HCL 50 MG PO TABS
50.0000 mg | ORAL_TABLET | Freq: Three times a day (TID) | ORAL | 2 refills | Status: AC | PRN
Start: 2022-12-06 — End: ?
  Filled 2022-12-06 – 2023-01-29 (×2): qty 90, 30d supply, fill #0
  Filled 2023-03-25: qty 90, 30d supply, fill #1
  Filled 2023-04-29 – 2023-05-30 (×2): qty 90, 30d supply, fill #2
  Filled ????-??-??: fill #2

## 2022-12-07 ENCOUNTER — Other Ambulatory Visit (HOSPITAL_COMMUNITY): Payer: Self-pay

## 2022-12-07 MED ORDER — DOXAZOSIN MESYLATE 2 MG PO TABS
2.0000 mg | ORAL_TABLET | Freq: Every day | ORAL | 0 refills | Status: DC
  Filled 2022-12-07: qty 30, 30d supply, fill #0

## 2022-12-07 MED ORDER — CLOPIDOGREL BISULFATE 75 MG PO TABS
75.0000 mg | ORAL_TABLET | Freq: Every day | ORAL | 0 refills | Status: DC
  Filled 2022-12-07 – 2023-01-01 (×2): qty 30, 30d supply, fill #0

## 2022-12-07 MED ORDER — NITROGLYCERIN 0.4 MG SL SUBL
0.4000 mg | SUBLINGUAL_TABLET | SUBLINGUAL | 0 refills | Status: DC | PRN
  Filled 2022-12-07: qty 25, 10d supply, fill #0

## 2022-12-07 MED ORDER — FUROSEMIDE 40 MG PO TABS
40.0000 mg | ORAL_TABLET | Freq: Two times a day (BID) | ORAL | 0 refills | Status: DC
  Filled 2022-12-07: qty 30, 15d supply, fill #0

## 2022-12-10 ENCOUNTER — Other Ambulatory Visit: Payer: Self-pay

## 2022-12-10 ENCOUNTER — Other Ambulatory Visit (HOSPITAL_COMMUNITY): Payer: Self-pay

## 2022-12-12 ENCOUNTER — Other Ambulatory Visit (HOSPITAL_COMMUNITY): Payer: Self-pay

## 2022-12-12 MED ORDER — TRAMADOL HCL 50 MG PO TABS
50.0000 mg | ORAL_TABLET | Freq: Three times a day (TID) | ORAL | 0 refills | Status: DC | PRN
Start: 2022-12-12 — End: 2023-02-07
  Filled 2022-12-12: qty 90, 30d supply, fill #0

## 2023-01-01 ENCOUNTER — Other Ambulatory Visit: Payer: Self-pay | Admitting: Cardiology

## 2023-01-01 ENCOUNTER — Other Ambulatory Visit: Payer: Self-pay

## 2023-01-01 ENCOUNTER — Other Ambulatory Visit (HOSPITAL_COMMUNITY): Payer: Self-pay

## 2023-01-01 MED ORDER — DOXAZOSIN MESYLATE 2 MG PO TABS
2.0000 mg | ORAL_TABLET | Freq: Every day | ORAL | 0 refills | Status: DC
  Filled 2023-01-01: qty 15, 15d supply, fill #0

## 2023-01-01 MED ORDER — ROSUVASTATIN CALCIUM 20 MG PO TABS
20.0000 mg | ORAL_TABLET | Freq: Every day | ORAL | 0 refills | Status: DC
  Filled 2023-01-01: qty 15, 15d supply, fill #0

## 2023-01-01 MED ORDER — METOPROLOL TARTRATE 50 MG PO TABS
50.0000 mg | ORAL_TABLET | Freq: Two times a day (BID) | ORAL | 0 refills | Status: DC
  Filled 2023-01-01: qty 30, 15d supply, fill #0

## 2023-01-01 MED ORDER — FUROSEMIDE 40 MG PO TABS
40.0000 mg | ORAL_TABLET | Freq: Two times a day (BID) | ORAL | 0 refills | Status: DC
  Filled 2023-01-01: qty 30, 15d supply, fill #0

## 2023-01-01 MED ORDER — POTASSIUM CHLORIDE CRYS ER 20 MEQ PO TBCR
20.0000 meq | EXTENDED_RELEASE_TABLET | Freq: Every day | ORAL | 0 refills | Status: DC
  Filled 2023-01-01: qty 15, 15d supply, fill #0

## 2023-01-01 MED ORDER — TRIAMTERENE-HCTZ 37.5-25 MG PO TABS
1.0000 | ORAL_TABLET | Freq: Every morning | ORAL | 3 refills | Status: DC
  Filled 2023-01-01: qty 90, 90d supply, fill #0
  Filled 2023-03-25: qty 90, 90d supply, fill #1
  Filled 2023-06-25: qty 90, 90d supply, fill #2
  Filled 2023-10-31: qty 90, 90d supply, fill #3

## 2023-01-07 ENCOUNTER — Ambulatory Visit: Payer: Medicare Other | Admitting: Pulmonary Disease

## 2023-01-14 ENCOUNTER — Other Ambulatory Visit: Payer: Self-pay | Admitting: Cardiology

## 2023-01-15 ENCOUNTER — Other Ambulatory Visit (HOSPITAL_COMMUNITY): Payer: Self-pay

## 2023-01-15 MED ORDER — POTASSIUM CHLORIDE CRYS ER 20 MEQ PO TBCR
20.0000 meq | EXTENDED_RELEASE_TABLET | Freq: Every day | ORAL | 0 refills | Status: DC
  Filled 2023-01-15: qty 15, 15d supply, fill #0

## 2023-01-15 MED ORDER — FUROSEMIDE 40 MG PO TABS
40.0000 mg | ORAL_TABLET | Freq: Two times a day (BID) | ORAL | 0 refills | Status: DC
  Filled 2023-01-15: qty 30, 15d supply, fill #0

## 2023-01-15 MED ORDER — ROSUVASTATIN CALCIUM 20 MG PO TABS
20.0000 mg | ORAL_TABLET | Freq: Every day | ORAL | 0 refills | Status: DC
  Filled 2023-01-15: qty 15, 15d supply, fill #0

## 2023-01-15 MED ORDER — METOPROLOL TARTRATE 50 MG PO TABS
50.0000 mg | ORAL_TABLET | Freq: Two times a day (BID) | ORAL | 0 refills | Status: DC
  Filled 2023-01-15: qty 30, 15d supply, fill #0

## 2023-01-15 MED ORDER — DOXAZOSIN MESYLATE 2 MG PO TABS
2.0000 mg | ORAL_TABLET | Freq: Every day | ORAL | 0 refills | Status: DC
  Filled 2023-01-15: qty 15, 15d supply, fill #0

## 2023-01-29 ENCOUNTER — Other Ambulatory Visit (HOSPITAL_COMMUNITY): Payer: Self-pay

## 2023-01-29 ENCOUNTER — Other Ambulatory Visit: Payer: Self-pay

## 2023-01-29 ENCOUNTER — Other Ambulatory Visit: Payer: Self-pay | Admitting: Cardiology

## 2023-01-29 ENCOUNTER — Encounter (HOSPITAL_COMMUNITY): Payer: Self-pay

## 2023-01-29 ENCOUNTER — Other Ambulatory Visit: Payer: Self-pay | Admitting: *Deleted

## 2023-01-29 DIAGNOSIS — I779 Disorder of arteries and arterioles, unspecified: Secondary | ICD-10-CM

## 2023-01-29 DIAGNOSIS — I70245 Atherosclerosis of native arteries of left leg with ulceration of other part of foot: Secondary | ICD-10-CM

## 2023-01-29 DIAGNOSIS — I739 Peripheral vascular disease, unspecified: Secondary | ICD-10-CM

## 2023-01-29 MED ORDER — TRAMADOL HCL 50 MG PO TABS
50.0000 mg | ORAL_TABLET | Freq: Three times a day (TID) | ORAL | 2 refills | Status: DC | PRN
  Filled 2023-01-29: qty 90, 30d supply, fill #0

## 2023-01-30 ENCOUNTER — Other Ambulatory Visit (HOSPITAL_COMMUNITY): Payer: Self-pay

## 2023-01-30 MED ORDER — DOXAZOSIN MESYLATE 2 MG PO TABS
2.0000 mg | ORAL_TABLET | Freq: Every day | ORAL | 1 refills | Status: DC
  Filled 2023-01-30: qty 90, 90d supply, fill #0
  Filled 2023-05-03: qty 90, 90d supply, fill #1

## 2023-01-30 MED ORDER — ROSUVASTATIN CALCIUM 20 MG PO TABS
20.0000 mg | ORAL_TABLET | Freq: Every day | ORAL | 1 refills | Status: DC
  Filled 2023-01-30: qty 90, 90d supply, fill #0
  Filled 2023-05-03: qty 90, 90d supply, fill #1

## 2023-01-30 MED ORDER — POTASSIUM CHLORIDE CRYS ER 20 MEQ PO TBCR
20.0000 meq | EXTENDED_RELEASE_TABLET | Freq: Every day | ORAL | 1 refills | Status: DC
  Filled 2023-01-30: qty 90, 90d supply, fill #0
  Filled 2023-05-03: qty 90, 90d supply, fill #1

## 2023-01-30 MED ORDER — METOPROLOL TARTRATE 50 MG PO TABS
50.0000 mg | ORAL_TABLET | Freq: Two times a day (BID) | ORAL | 1 refills | Status: DC
  Filled 2023-01-30: qty 180, 90d supply, fill #0
  Filled 2023-05-03: qty 180, 90d supply, fill #1

## 2023-01-30 MED ORDER — FUROSEMIDE 40 MG PO TABS
40.0000 mg | ORAL_TABLET | Freq: Two times a day (BID) | ORAL | 1 refills | Status: DC
  Filled 2023-01-30: qty 180, 90d supply, fill #0
  Filled 2023-05-03: qty 180, 90d supply, fill #1

## 2023-01-31 ENCOUNTER — Other Ambulatory Visit (HOSPITAL_COMMUNITY): Payer: Self-pay

## 2023-01-31 MED ORDER — AMOXICILLIN-POT CLAVULANATE 875-125 MG PO TABS
1.0000 | ORAL_TABLET | Freq: Two times a day (BID) | ORAL | 0 refills | Status: DC
  Filled 2023-01-31: qty 20, 10d supply, fill #0

## 2023-01-31 MED ORDER — PREDNISONE 10 MG (21) PO TBPK
ORAL_TABLET | ORAL | 0 refills | Status: DC
  Filled 2023-01-31: qty 21, 6d supply, fill #0

## 2023-02-07 ENCOUNTER — Other Ambulatory Visit (HOSPITAL_COMMUNITY): Payer: Self-pay

## 2023-02-07 ENCOUNTER — Encounter: Payer: Self-pay | Admitting: Emergency Medicine

## 2023-02-07 ENCOUNTER — Ambulatory Visit: Payer: Medicare Other | Admitting: Emergency Medicine

## 2023-02-07 VITALS — BP 122/82 | HR 73 | Temp 98.3°F | Ht 66.0 in | Wt 246.0 lb

## 2023-02-07 DIAGNOSIS — G4733 Obstructive sleep apnea (adult) (pediatric): Secondary | ICD-10-CM | POA: Diagnosis not present

## 2023-02-07 DIAGNOSIS — J449 Chronic obstructive pulmonary disease, unspecified: Secondary | ICD-10-CM

## 2023-02-07 DIAGNOSIS — J9611 Chronic respiratory failure with hypoxia: Secondary | ICD-10-CM

## 2023-02-07 MED ORDER — BUDESONIDE-FORMOTEROL FUMARATE 160-4.5 MCG/ACT IN AERO
2.0000 | INHALATION_SPRAY | Freq: Two times a day (BID) | RESPIRATORY_TRACT | 3 refills | Status: AC
  Filled 2023-02-07: qty 30.6, 90d supply, fill #0
  Filled 2023-02-11: qty 10.2, 30d supply, fill #0
  Filled 2023-02-27: qty 30.6, 90d supply, fill #0
  Filled 2023-08-15: qty 30.6, 90d supply, fill #1

## 2023-02-07 NOTE — Progress Notes (Signed)
 Subjective:    Patient ID: Hunter [name], male    DOB: [birth year], 73 y.o.   MRN: [id number]  HPI 73 year old gentleman who has been followed by Dr. Brenna in our office for history of OSA on CPAP, COPD.  He is a former smoker with obesity, coronary artery disease and HFrEF, diabetes, GERD, hypertension, hyperlipidemia, peripheral vascular disease, spinal cord injury with immobility.  Last saw Dr. Brenna in October 2023.  At that time he was on Breztri .  He began having urinary difficulty in 08/2022 and his Breztri  was changed to Symbicort . He never picked up or started the symbicort . He uses albuterol  nebs bid, rarely uses extra doses.  Today he reports that his urinary sx improved after making the switch.   He is compliant w his CPAP + O2, 2.5L/min. Rarely uses O2 during the day w exertion.  Good clinical benefit from the CPAP.   Review of Systems As per HPI  Past Medical History:  Diagnosis Date   (HFpEF) heart failure with preserved ejection fraction (HCC)    Echo 5/22: EF 50-55, no RWMA, moderate LVH, GRII DD, normal RVSF, trivial MR   Bilateral lower extremity edema    CAD (coronary artery disease) cardiologist-  dr jeffrie   a. Remote MI with stent in 2005;  b. CABG x 5 in 2005;  c. 09/2010 Cath: LM nl, LAD 70p, 90-62m, D1 80p, LCX 29m, OM1 100, OM2 90ost, OM3 80, RCA 58m, VG->PDA->RPL nl, VG->Diag nl, VG->OM1 nl, LIMA->LAD nl, EF 60%-->Med Rx.// Myoview  5/22: EF 46, mod inf infarct, no ischemia    Carotid artery disease (HCC)    Chronic venous insufficiency    post vein harvest for CABG   COPD (chronic obstructive pulmonary disease) (HCC)    Diabetes mellitus type 2, controlled (HCC)    Difficult intravenous access 06/16/2015   multiple attempt IV starts until use of scanner   ED (erectile dysfunction) of organic origin    Foot drop, bilateral    GERD (gastroesophageal reflux disease)    H/O spinal cord injury    a. more or less wheelchair bound. 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 Harvey   S/P CABG x 5    04-28-2003   Type 2 diabetes mellitus (HCC)    Urinary incontinence, urge    Wheelchair bound      Family History  Problem Relation Age of Onset   Other Father        WORK ACCIDENT   Heart disease Father        Heart Disease before age 37   Heart attack Mother  Heart disease Mother        before age 14   Diabetes Mother    Colon cancer Mother    Heart disease Brother        before age 33   Diabetes Brother    Heart attack Brother    Suicidality Brother    Coronary artery disease Sister        CABG   Heart disease Sister    Diabetes Sister    Heart attack Sister    Lung cancer Sister    COPD Sister    Coronary artery disease Brother      Social History   Socioeconomic History   Marital status: Divorced    Spouse name: Not on file   Number of children: 2   Years of education: Not on file   Highest education level: Not on file  Occupational History   Occupation: disabled  Tobacco Use   Smoking status: Former    Current packs/day: 0.00    Average packs/day: 0.5 packs/day for 40.0 years (20.0 ttl pk-yrs)    Types: Cigarettes    Start date: 01/01/1969    Quit date: 01/01/2009    Years since quitting: 14.1    Passive exposure: Never   Smokeless tobacco:  Never   Tobacco comments:    as of 06-09-2015 per pt last month lite smoker  Vaping Use   Vaping status: Never Used  Substance and Sexual Activity   Alcohol use: No    Alcohol/week: 0.0 standard drinks of alcohol   Drug use: No   Sexual activity: Not on file  Other Topics Concern   Not on file  Social History Narrative   Not on file   Social Drivers of Health   Financial Resource Strain: Not on file  Food Insecurity: Not on file  Transportation Needs: Not on file  Physical Activity: Not on file  Stress: Not on file  Social Connections: Not on file  Intimate Partner Violence: Not on file     Allergies  Allergen Reactions   Chantix  [Varenicline ] Shortness Of Breath   Codeine Nausea And Vomiting and Other (See Comments)    Severe stomach cramps   Amitriptyline Hcl     Unknown   Atorvastatin      Increase LFT's   Glucophage [Metformin] Diarrhea and Nausea And Vomiting   Ranolazine      nauseated and dizziness   Lisinopril Cough   Ramipril Cough   Tape Other (See Comments)    Only plastic tape--can use paper tape OK. Blisters     Outpatient Medications Prior to Visit  Medication Sig Dispense Refill   albuterol  (PROVENTIL ) (2.5 MG/3ML) 0.083% nebulizer solution USE 1 VIAL IN NEBULIZER EVERY 6 HOURS - and as needed DX: J44.9 (Patient taking differently: Take 2.5 mg by nebulization 2 (two) times daily.) 360 mL 11   albuterol  (VENTOLIN  HFA) 108 (90 Base) MCG/ACT inhaler Inhale 2 puffs into the lungs every 4-6 hours as needed. 6.7 g 5   alum hydroxide-mag trisilicate (GAVISCON) 80-20 MG CHEW chewable tablet Chew 2 tablets by mouth 3 (three) times daily as needed for indigestion or heartburn.     amLODipine  (NORVASC ) 5 MG tablet Take 1 tablet (5 mg total) by mouth daily. 90 tablet 3   amoxicillin -clavulanate (AUGMENTIN ) 875-125 MG tablet Take 1 tablet by mouth every 12 (twelve) hours for 10 days. 20 tablet 0   aspirin  81 MG tablet Take 81 mg by mouth at bedtime.      cetirizine (  ZYRTEC) 10 MG tablet Take 10 mg by mouth daily.     clopidogrel  (PLAVIX ) 75 MG tablet Take 1 tablet (75 mg total) by mouth daily. Please call office to schedule appt for further refills. 30 tablet 0   Continuous Glucose Sensor (FREESTYLE LIBRE 3 SENSOR) MISC Change sensor every 14 days to monitor blood glucose continuously 6 each 3   Continuous Glucose Sensor (FREESTYLE LIBRE 3 SENSOR) MISC Change sensor every 14 days to monitor blood glucose continuously. 6 each 3   docusate sodium  (COLACE) 100 MG capsule Take 100 mg by mouth daily.     doxazosin  (CARDURA ) 2 MG tablet Take 1 tablet (2 mg total) by mouth daily. 90 tablet 1   fluticasone  (CUTIVATE ) 0.005 % ointment Apply 1 application externally 2 (two) times daily as needed for irritation, not near eyes for 14 days 30 g 1   furosemide  (LASIX ) 40 MG tablet Take 1 tablet (40 mg total) by mouth 2 (two) times daily. 180 tablet 1   gabapentin  (NEURONTIN ) 300 MG capsule Take 1 capsule (300 mg total) by mouth in the morning AND 2 capsules (600 mg total) every evening. 270 capsule 1   glucose blood (ONETOUCH VERIO) test strip USE TO SELF MONITOR BLOOD GLUCOSE UP TO 4 TIMES DAILY (E11.65) 400 strip 3   insulin  degludec (TRESIBA  FLEXTOUCH) 200 UNIT/ML FlexTouch Pen Inject 66 Units into the skin daily. 27 mL 5   insulin  lispro (HUMALOG ) 100 UNIT/ML KwikPen Inject 30 Units into the skin 3 (three) times daily with each  meal. 60 mL 5   Insulin  Pen Needle (TECHLITE PEN NEEDLES) 32G X 4 MM MISC Use  3 times daily with Humalog  and 1 time daily with Tresiba  400 each 5   ipratropium (ATROVENT ) 0.06 % nasal spray Place 1-2 sprays into both nostrils 3 (three) times daily as needed for congestion. 45 mL 1   ketotifen (ZADITOR) 0.025 % ophthalmic solution Place 2 drops as needed into both eyes (for allergies).      loperamide  (IMODIUM  A-D) 2 MG tablet Take 1 mg by mouth at bedtime.     methocarbamol  (ROBAXIN ) 500 MG tablet Take 1 tablet (500 mg total) by mouth  every evening. 90 tablet 3   metoprolol  tartrate (LOPRESSOR ) 50 MG tablet Take 1 tablet (50 mg total) by mouth 2 (two) times daily. 180 tablet 1   nitroGLYCERIN  (NITROSTAT ) 0.4 MG SL tablet Place 1 tablet (0.4 mg total) under the tongue every 5 (five) minutes as needed for chest pain 25 tablet 0   NOVOFINE 32G X 6 MM MISC USE ONCE DAILY WITH LEVEMIR  PEN  1   omeprazole  (PRILOSEC) 40 MG capsule Take 1 capsule (40 mg total) by mouth 2 (two) times daily. 180 capsule 4   ONETOUCH VERIO test strip USE TO SELF MONITOR BLOOD GLUCOSE UP TO 4 TIMES DAILY (E11.65)  4   Polyvinyl Alcohol-Povidone (REFRESH OP) Place 1 drop into both eyes daily as needed (dry eyes).     potassium chloride  SA (KLOR-CON  M20) 20 MEQ tablet Take 1 tablet (20 mEq total) by mouth daily. 90 tablet 1   Psyllium (METAMUCIL PO) Take 1 Scoop by mouth 2 (two) times daily.     Respiratory Therapy Supplies (FLUTTER) DEVI Use as directed 1 each 0   rosuvastatin  (CRESTOR ) 20 MG tablet Take 1 tablet (20 mg total) by mouth daily. 90 tablet 1   Semaglutide , 1 MG/DOSE, (OZEMPIC , 1 MG/DOSE,) 4 MG/3ML SOPN Inject 1 mg into the skin once a week.  3 mL 11   traMADol  (ULTRAM ) 50 MG tablet Take 1 tablet (50 mg total) by mouth 3 (three) times daily as needed for pain 90 tablet 2   traMADol  (ULTRAM ) 50 MG tablet Take 1 tablet (50 mg total) by mouth 3 (three) times daily as needed for pain 90 tablet 2   triamterene -hydrochlorothiazide  (MAXZIDE -25) 37.5-25 MG tablet Take 1 tablet by mouth once a day, in the morning. 90 tablet 3   Vitamin D , Ergocalciferol , (DRISDOL ) 1.25 MG (50000 UNIT) CAPS capsule Take 1 capsule (50,000 Units total) by mouth 2 (two) times a week on Wednesday and Sunday. 24 capsule 3   Budeson-Glycopyrrol-Formoterol  (BREZTRI  AEROSPHERE) 160-9-4.8 MCG/ACT AERO Inhale 2 puffs into the lungs in the morning and at bedtime. 10.7 g 11   budesonide -formoterol  (SYMBICORT ) 160-4.5 MCG/ACT inhaler Inhale 2 puffs into the lungs 2 (two) times daily.  30.6 g 3   predniSONE  (STERAPRED UNI-PAK 21 TAB) 10 MG (21) TBPK tablet Take as directed on package with food for a 6 day taper. 21 tablet 0   traMADol  (ULTRAM ) 50 MG tablet Take 1 tablet (50 mg total) by mouth 3 (three) times daily as needed for pain. 90 tablet 0   traMADol  (ULTRAM ) 50 MG tablet Take 1 tablet (50 mg total) by mouth 3 (three) times daily as needed for pain. 90 tablet 2   No facility-administered medications prior to visit.        Objective:   Physical Exam  Vitals:   02/07/23 1556  BP: 122/82  Pulse: 73  Temp: 98.3 F (36.8 C)  TempSrc: Oral  SpO2: 98%  Weight: 246 lb (111.6 kg)  Height: 5' 6 (1.676 m)   Gen: Pleasant, obese man in a wheelchair, in no distress,  normal affect  ENT: No lesions,  mouth clear,  oropharynx clear, no postnasal drip  Neck: No JVD, no stridor  Lungs: No use of accessory muscles, decreased at the bases, no wheezes or crackles  Cardiovascular: RRR, heart sounds normal, no murmur or gallops, 1+ lower extremity peripheral edema  Musculoskeletal: No deformities, no cyanosis or clubbing  Neuro: alert, awake, non focal  Skin: Warm, no lesions or rash     Assessment & Plan:  COPD (chronic obstructive pulmonary disease) (HCC) He did not tolerate Breztri  due to urinary retention.  He never started the Symbicort  so I will send a prescription now.  He will let us  know if he has side effects.  He does use albuterol  twice a day on a schedule.  Hopefully his frequency will decrease once he is on maintenance medication.  Obstructive sleep apnea (adult) (pediatric) Good compliance with his CPAP with oxygen bled in.  He gets good clinical benefit, better sleep, less sleepiness during the day.  Plan to continue same.  His device is 73 years old and he has all the equipment he needs.  Chronic respiratory failure with hypoxia (HCC) He has oxygen at home to use with exertion, uses rarely.  Have asked him to try to be better compliant, check his  saturations with goal to keep greater than 90%.   Lamar Chris, MD, PhD 02/07/2023, 4:35 PM [facility] [phone] or if no answer before 7:00PM call [phone] For any issues after 7:00PM please call eLink [phone]

## 2023-02-07 NOTE — Assessment & Plan Note (Signed)
 He has oxygen at home to use with exertion, uses rarely.  Have asked him to try to be better compliant, check his saturations with goal to keep greater than 90%.

## 2023-02-07 NOTE — Assessment & Plan Note (Signed)
 He did not tolerate Breztri  due to urinary retention.  He never started the Symbicort  so I will send a prescription now.  He will let us  know if he has side effects.  He does use albuterol  twice a day on a schedule.  Hopefully his frequency will decrease once he is on maintenance medication.

## 2023-02-07 NOTE — Assessment & Plan Note (Signed)
 Good compliance with his CPAP with oxygen bled in.  He gets good clinical benefit, better sleep, less sleepiness during the day.  Plan to continue same.  His device is 73 years old and he has all the equipment he needs.

## 2023-02-07 NOTE — Patient Instructions (Addendum)
 Please continue to wear your CPAP with oxygen 2.5 L/min every night as you have been doing Wear your oxygen during the daytime with exertion if needed.  Our goal is to keep your saturations > 90% We will start Symbicort  2 puffs twice a day.  Rinse and gargle after using.  Please let us  know if you develop any side effects so we can make adjustments. Keep your albuterol  available to use either 1 nebulizer treatment or 2 puffs when needed for shortness of breath, chest tightness, wheezing. Follow-up with APP in 6 months Follow Dr. Shelah in 12 months, sooner if you have any problems.

## 2023-02-08 ENCOUNTER — Other Ambulatory Visit (HOSPITAL_COMMUNITY): Payer: Self-pay

## 2023-02-11 ENCOUNTER — Ambulatory Visit (HOSPITAL_COMMUNITY): Payer: Medicare Other

## 2023-02-11 ENCOUNTER — Other Ambulatory Visit (HOSPITAL_COMMUNITY): Payer: Self-pay

## 2023-02-11 ENCOUNTER — Ambulatory Visit: Payer: Medicare Other

## 2023-02-14 ENCOUNTER — Ambulatory Visit: Payer: Medicare Other | Admitting: Podiatry

## 2023-02-21 ENCOUNTER — Ambulatory Visit: Payer: Medicare Other | Admitting: Podiatry

## 2023-02-26 ENCOUNTER — Ambulatory Visit: Payer: Medicare Other | Admitting: Podiatry

## 2023-02-26 ENCOUNTER — Encounter: Payer: Self-pay | Admitting: Podiatry

## 2023-02-26 DIAGNOSIS — M79676 Pain in unspecified toe(s): Secondary | ICD-10-CM

## 2023-02-26 DIAGNOSIS — B351 Tinea unguium: Secondary | ICD-10-CM | POA: Diagnosis not present

## 2023-02-26 DIAGNOSIS — E1151 Type 2 diabetes mellitus with diabetic peripheral angiopathy without gangrene: Secondary | ICD-10-CM | POA: Diagnosis not present

## 2023-02-26 DIAGNOSIS — D689 Coagulation defect, unspecified: Secondary | ICD-10-CM

## 2023-02-26 NOTE — Progress Notes (Signed)
 He presents today chief complaint of painful elongated toenails.  Denies any fever chills nausea vomit muscle aches pains.  Objective: Pulses are nonpalpable capillary fill time is immediate toenails are long thick yellow dystrophic onychomycotic no open lesions or wounds are noted.  Assessment: Pain limb secondary to onychomycosis.  Plan: Debridement of toenails 1 through 5 bilateral.

## 2023-02-27 ENCOUNTER — Other Ambulatory Visit: Payer: Self-pay | Admitting: Cardiology

## 2023-02-27 ENCOUNTER — Other Ambulatory Visit: Payer: Self-pay

## 2023-02-27 ENCOUNTER — Other Ambulatory Visit (HOSPITAL_COMMUNITY): Payer: Self-pay

## 2023-02-27 MED ORDER — OMEPRAZOLE 40 MG PO CPDR
40.0000 mg | DELAYED_RELEASE_CAPSULE | Freq: Two times a day (BID) | ORAL | 4 refills | Status: AC
  Filled 2023-02-27 – 2023-03-25 (×2): qty 180, 90d supply, fill #0
  Filled 2023-06-25: qty 180, 90d supply, fill #1
  Filled 2023-09-18: qty 180, 90d supply, fill #0
  Filled 2023-12-18: qty 180, 90d supply, fill #1

## 2023-02-27 MED ORDER — GABAPENTIN 300 MG PO CAPS
ORAL_CAPSULE | ORAL | 1 refills | Status: DC
  Filled 2023-02-27 – 2023-03-25 (×2): qty 270, 90d supply, fill #0
  Filled 2023-06-25: qty 270, 90d supply, fill #1

## 2023-02-28 ENCOUNTER — Other Ambulatory Visit: Payer: Self-pay

## 2023-02-28 ENCOUNTER — Other Ambulatory Visit (HOSPITAL_COMMUNITY): Payer: Self-pay

## 2023-02-28 MED ORDER — CLOPIDOGREL BISULFATE 75 MG PO TABS
75.0000 mg | ORAL_TABLET | Freq: Every day | ORAL | 1 refills | Status: DC
  Filled 2023-02-28: qty 30, 30d supply, fill #0
  Filled 2023-03-25: qty 30, 30d supply, fill #1

## 2023-03-04 ENCOUNTER — Other Ambulatory Visit (HOSPITAL_COMMUNITY): Payer: Self-pay

## 2023-03-05 ENCOUNTER — Other Ambulatory Visit (HOSPITAL_COMMUNITY): Payer: Self-pay

## 2023-03-25 ENCOUNTER — Other Ambulatory Visit: Payer: Self-pay

## 2023-03-25 ENCOUNTER — Other Ambulatory Visit: Payer: Self-pay | Admitting: Cardiology

## 2023-03-25 ENCOUNTER — Other Ambulatory Visit (HOSPITAL_COMMUNITY): Payer: Self-pay

## 2023-03-25 DIAGNOSIS — I25118 Atherosclerotic heart disease of native coronary artery with other forms of angina pectoris: Secondary | ICD-10-CM

## 2023-03-25 MED ORDER — NITROGLYCERIN 0.4 MG SL SUBL
0.4000 mg | SUBLINGUAL_TABLET | SUBLINGUAL | 0 refills | Status: DC | PRN
  Filled 2023-03-25: qty 25, 10d supply, fill #0

## 2023-03-26 ENCOUNTER — Encounter (HOSPITAL_COMMUNITY): Payer: Self-pay

## 2023-03-26 ENCOUNTER — Other Ambulatory Visit (HOSPITAL_COMMUNITY): Payer: Self-pay

## 2023-03-26 ENCOUNTER — Other Ambulatory Visit: Payer: Self-pay

## 2023-03-26 MED ORDER — AMLODIPINE BESYLATE 5 MG PO TABS
5.0000 mg | ORAL_TABLET | Freq: Every day | ORAL | 3 refills | Status: AC
  Filled 2023-03-26: qty 90, 90d supply, fill #0
  Filled 2023-06-25: qty 90, 90d supply, fill #1
  Filled 2023-09-24: qty 90, 90d supply, fill #2
  Filled 2023-12-18: qty 90, 90d supply, fill #3

## 2023-03-26 MED ORDER — FREESTYLE LIBRE 3 SENSOR MISC
1.0000 | 3 refills | Status: AC
Start: 2023-03-26 — End: ?
  Filled 2023-03-26 – 2023-06-06 (×5): qty 6, 84d supply, fill #0
  Filled 2024-02-04: qty 6, 84d supply, fill #1

## 2023-03-26 MED ORDER — TRAMADOL HCL 50 MG PO TABS
50.0000 mg | ORAL_TABLET | Freq: Three times a day (TID) | ORAL | 2 refills | Status: DC | PRN
  Filled 2023-03-26 – 2023-05-07 (×3): qty 90, 30d supply, fill #0
  Filled 2023-06-25: qty 90, 30d supply, fill #1
  Filled 2023-07-23 – 2023-08-15 (×2): qty 90, 30d supply, fill #2

## 2023-04-01 ENCOUNTER — Other Ambulatory Visit (HOSPITAL_COMMUNITY): Payer: Self-pay

## 2023-04-01 ENCOUNTER — Other Ambulatory Visit: Payer: Self-pay

## 2023-04-02 ENCOUNTER — Other Ambulatory Visit: Payer: Self-pay

## 2023-04-08 ENCOUNTER — Encounter: Payer: Self-pay | Admitting: Cardiology

## 2023-04-08 ENCOUNTER — Ambulatory Visit: Payer: Medicare Other | Attending: Cardiology | Admitting: Cardiology

## 2023-04-08 VITALS — BP 132/62 | HR 68 | Ht 66.0 in | Wt 246.0 lb

## 2023-04-08 DIAGNOSIS — I6529 Occlusion and stenosis of unspecified carotid artery: Secondary | ICD-10-CM

## 2023-04-08 DIAGNOSIS — I872 Venous insufficiency (chronic) (peripheral): Secondary | ICD-10-CM | POA: Diagnosis not present

## 2023-04-08 DIAGNOSIS — I739 Peripheral vascular disease, unspecified: Secondary | ICD-10-CM

## 2023-04-08 DIAGNOSIS — I70245 Atherosclerosis of native arteries of left leg with ulceration of other part of foot: Secondary | ICD-10-CM

## 2023-04-08 DIAGNOSIS — E1151 Type 2 diabetes mellitus with diabetic peripheral angiopathy without gangrene: Secondary | ICD-10-CM

## 2023-04-08 DIAGNOSIS — L97522 Non-pressure chronic ulcer of other part of left foot with fat layer exposed: Secondary | ICD-10-CM

## 2023-04-08 NOTE — Patient Instructions (Signed)

## 2023-04-08 NOTE — Progress Notes (Signed)
 Cardiology Office Note:  .   Date:  04/08/2023  ID:  Hunter [name], DOB 1950-07-05, MRN [id number] PCP: Garlan Fillers, MD  [facility] Providers Cardiologist:  Donato Schultz, MD     History of Present Illness: .   Hunter [name] is a 73 y.o. male Discussed the use of AI scribe software for clinical note transcription with the patient, who gave verbal consent to proceed.  History of Present Illness The patient is a 73 year old with coronary artery disease, COPD, and diabetes who presents for cardiovascular follow-up.  He has a history of coronary artery disease and underwent coronary artery bypass grafting (CABG) in the past. He experiences chest discomfort that improves with drinking water and taking Metamucil. He has not tolerated isosorbide or Ranexa in the past. His last cardiac catheterization in 2019 showed all grafts were patent, with small vessel disease not amenable to PCI. Nuclear stress tests have been reassuring. He is currently on metoprolol 50 mg twice a day, aspirin 81 mg, Plavix 75 mg, and rosuvastatin 20 mg daily.  He has significant COPD and obstructive sleep apnea, for which he uses CPAP at 2 liters. He experiences multifactorial shortness of breath attributed to pulmonary condition, deconditioning, and cardiac conditioning. He is out of breath with minimal exertion, such as walking to the bathroom. He is also on Lasix, which he takes once daily, as he cannot tolerate it twice a day.  He has diabetes with peripheral arterial disease. He manages fluctuating blood sugar levels, noting that his sugar has been 'up and down' with the use of Ozempic, leading him to consume candy to raise his sugar levels. He has a history of a non-healing sore on his toe, initially a blister that became infected, requiring six weeks of treatment at a wound center. He has had stents placed in the left iliac and superficial femoral arteries, which are widely patent.  He reports  hyperlipidemia and is currently on rosuvastatin 20 mg daily. He also takes amlodipine 5 mg for blood pressure control.  Socially, he has been active in his garden, preparing it for the season, despite the pollen affecting him. He notes weight gain over the winter, from 230 to 246 pounds, and is attempting to lose weight with increased outdoor activity.      Studies Reviewed: .        Results LABS Continuous blood glucose monitor: 56 (04/08/2023)  RADIOLOGY Angiogram: Bilateral femoral arteries patent, right common femoral artery widely patent, left common femoral artery widely patent, widely patent left iliac and superficial femoral artery stents (08/2022)  DIAGNOSTIC Cardiac catheterization: All grafts patent, small vessel disease noted, not amenable to PCI (2019) Nuclear stress test: Reassuring Risk Assessment/Calculations:            Physical Exam:   VS:  BP 132/62   Pulse 68   Ht 5\' 6"  (1.676 m)   Wt 246 lb (111.6 kg)   SpO2 97%   BMI 39.71 kg/m    Wt Readings from Last 3 Encounters:  04/08/23 246 lb (111.6 kg)  02/07/23 246 lb (111.6 kg)  08/07/22 230 lb (104.3 kg)    GEN: Well nourished, well developed in no acute distress NECK: No JVD; No carotid bruits CARDIAC: RRR, no murmurs, no rubs, no gallops RESPIRATORY:  decreased breath sounds Bilat ABDOMEN: Soft, non-tender, non-distended EXTREMITIES:  L > R  edema; No deformity   ASSESSMENT AND PLAN: .    Assessment and Plan Assessment & Plan  Peripheral Arterial Disease (PAD) Peripheral arterial disease with previous stenting of the left iliac and superficial femoral arteries. Recent angiogram showed widely patent bilateral femoral arteries and stents. Non-healing toe sore initially suspected to be due to poor blood flow, but tests confirmed adequate perfusion. No signs of critical limb ischemia. - Continue aspirin 81 mg and clopidogrel 75 mg for stent patency.  Coronary Artery Disease (CAD) Coronary artery  disease with previous CABG. Last cardiac catheterization in 2019 showed patent grafts but small vessel disease not amenable to PCI. Experiences chest discomfort relieved by water and Metamucil, with intolerance to isosorbide and Ranexa. Managed with medications and lifestyle modifications. - Continue metoprolol 50 mg twice daily, rosuvastatin 20 mg daily, and amlodipine 5 mg for blood pressure control. - Encourage weight loss and physical activity as tolerated.  Hypertension Hypertension managed with amlodipine and metoprolol. Blood pressure control is crucial given cardiovascular history. - Continue current antihypertensive regimen.  Hyperlipidemia Hyperlipidemia managed with rosuvastatin 20 mg daily for cardiovascular risk reduction. - Continue rosuvastatin 20 mg daily. - Monitor lipid levels periodically.  Chronic Obstructive Pulmonary Disease (COPD) Significant COPD managed with CPAP at 2 liters. Contributes to multifactorial shortness of breath along with cardiac and deconditioning factors. Regular follow-up with pulmonologist Dr. Corliss Blacker. - Continue CPAP therapy at 2 liters. - Encourage pulmonary rehabilitation and physical activity as tolerated.  Diabetes Mellitus Diabetes mellitus with fluctuating blood glucose levels, exacerbated by Ozempic. Reports hypoglycemia managed with candy intake. Requires careful monitoring and medication adjustment. - Monitor blood glucose levels closely and adjust Ozempic dosage as needed. - Educate on dietary modifications to stabilize blood glucose levels.  Follow-up Multiple follow-up appointments scheduled, including with Dr. Corliss Blacker for pulmonary management and for compression tests on the legs. - Schedule follow-up appointment in one year.          Signed, Donato Schultz, MD

## 2023-04-15 ENCOUNTER — Ambulatory Visit (INDEPENDENT_AMBULATORY_CARE_PROVIDER_SITE_OTHER): Payer: Medicare Other | Admitting: Physician Assistant

## 2023-04-15 ENCOUNTER — Encounter: Payer: Self-pay | Admitting: Physician Assistant

## 2023-04-15 ENCOUNTER — Ambulatory Visit (HOSPITAL_COMMUNITY)
Admission: RE | Admit: 2023-04-15 | Discharge: 2023-04-15 | Disposition: A | Discharge status: Home / Self Care | Payer: Medicare Other | Source: Ambulatory Visit | Attending: Surgery | Admitting: Surgery

## 2023-04-15 ENCOUNTER — Ambulatory Visit (INDEPENDENT_AMBULATORY_CARE_PROVIDER_SITE_OTHER)
Admission: RE | Admit: 2023-04-15 | Discharge: 2023-04-15 | Disposition: A | Discharge status: Home / Self Care | Payer: Medicare Other | Source: Ambulatory Visit | Attending: Surgery | Admitting: Surgery

## 2023-04-15 VITALS — BP 141/71 | HR 64 | Temp 97.9°F | Ht 66.0 in | Wt 246.0 lb

## 2023-04-15 DIAGNOSIS — I739 Peripheral vascular disease, unspecified: Secondary | ICD-10-CM | POA: Diagnosis present

## 2023-04-15 DIAGNOSIS — I872 Venous insufficiency (chronic) (peripheral): Secondary | ICD-10-CM

## 2023-04-15 DIAGNOSIS — I70245 Atherosclerosis of native arteries of left leg with ulceration of other part of foot: Secondary | ICD-10-CM

## 2023-04-15 DIAGNOSIS — I779 Disorder of arteries and arterioles, unspecified: Secondary | ICD-10-CM

## 2023-04-15 DIAGNOSIS — I6523 Occlusion and stenosis of bilateral carotid arteries: Secondary | ICD-10-CM
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
## 2023-04-16 ENCOUNTER — Other Ambulatory Visit: Payer: Self-pay | Admitting: *Deleted

## 2023-04-16 DIAGNOSIS — I739 Peripheral vascular disease, unspecified: Secondary | ICD-10-CM

## 2023-04-16 DIAGNOSIS — I6523 Occlusion and stenosis of bilateral carotid arteries: Secondary | ICD-10-CM

## 2023-04-16 DIAGNOSIS — I779 Disorder of arteries and arterioles, unspecified: Secondary | ICD-10-CM

## 2023-04-25 ENCOUNTER — Encounter (HOSPITAL_COMMUNITY): Payer: Self-pay

## 2023-04-25 ENCOUNTER — Observation Stay (HOSPITAL_COMMUNITY)
Admission: EM | Admit: 2023-04-25 | Discharge: 2023-04-26 | Disposition: A | Discharge status: Home / Self Care | Attending: Internal Medicine | Admitting: Internal Medicine

## 2023-04-25 ENCOUNTER — Emergency Department (HOSPITAL_COMMUNITY)

## 2023-04-25 ENCOUNTER — Other Ambulatory Visit: Payer: Self-pay

## 2023-04-25 DIAGNOSIS — Z951 Presence of aortocoronary bypass graft: Secondary | ICD-10-CM | POA: Insufficient documentation

## 2023-04-25 DIAGNOSIS — Z8679 Personal history of other diseases of the circulatory system: Secondary | ICD-10-CM

## 2023-04-25 DIAGNOSIS — Z7985 Long-term (current) use of injectable non-insulin antidiabetic drugs: Secondary | ICD-10-CM | POA: Insufficient documentation

## 2023-04-25 DIAGNOSIS — Z794 Long term (current) use of insulin: Secondary | ICD-10-CM | POA: Insufficient documentation

## 2023-04-25 DIAGNOSIS — I2510 Atherosclerotic heart disease of native coronary artery without angina pectoris: Secondary | ICD-10-CM | POA: Diagnosis not present

## 2023-04-25 DIAGNOSIS — R1031 Right lower quadrant pain: Secondary | ICD-10-CM | POA: Diagnosis present

## 2023-04-25 DIAGNOSIS — M7989 Other specified soft tissue disorders: Secondary | ICD-10-CM | POA: Diagnosis not present

## 2023-04-25 DIAGNOSIS — E1140 Type 2 diabetes mellitus with diabetic neuropathy, unspecified: Secondary | ICD-10-CM | POA: Diagnosis not present

## 2023-04-25 DIAGNOSIS — I10 Essential (primary) hypertension: Secondary | ICD-10-CM | POA: Diagnosis present

## 2023-04-25 DIAGNOSIS — J449 Chronic obstructive pulmonary disease, unspecified: Secondary | ICD-10-CM | POA: Insufficient documentation

## 2023-04-25 DIAGNOSIS — Z87891 Personal history of nicotine dependence: Secondary | ICD-10-CM | POA: Diagnosis not present

## 2023-04-25 DIAGNOSIS — E785 Hyperlipidemia, unspecified: Secondary | ICD-10-CM | POA: Insufficient documentation

## 2023-04-25 DIAGNOSIS — I739 Peripheral vascular disease, unspecified: Secondary | ICD-10-CM | POA: Diagnosis not present

## 2023-04-25 DIAGNOSIS — Z7982 Long term (current) use of aspirin: Secondary | ICD-10-CM | POA: Insufficient documentation

## 2023-04-25 DIAGNOSIS — G629 Polyneuropathy, unspecified: Secondary | ICD-10-CM

## 2023-04-25 DIAGNOSIS — Z7902 Long term (current) use of antithrombotics/antiplatelets: Secondary | ICD-10-CM | POA: Insufficient documentation

## 2023-04-25 DIAGNOSIS — I110 Hypertensive heart disease with heart failure: Secondary | ICD-10-CM | POA: Insufficient documentation

## 2023-04-25 DIAGNOSIS — Z79899 Other long term (current) drug therapy: Secondary | ICD-10-CM | POA: Diagnosis not present

## 2023-04-25 DIAGNOSIS — E119 Type 2 diabetes mellitus without complications: Secondary | ICD-10-CM

## 2023-04-25 DIAGNOSIS — I5032 Chronic diastolic (congestive) heart failure: Secondary | ICD-10-CM | POA: Diagnosis not present

## 2023-04-25 LAB — COMPREHENSIVE METABOLIC PANEL WITH GFR
ALT: 16 U/L (ref 0–44)
AST: 18 U/L (ref 15–41)
Albumin: 3.6 g/dL (ref 3.5–5.0)
Alkaline Phosphatase: 40 U/L (ref 38–126)
Anion gap: 10 (ref 5–15)
BUN: 19 mg/dL (ref 8–23)
CO2: 26 mmol/L (ref 22–32)
Calcium: 8.9 mg/dL (ref 8.9–10.3)
Chloride: 100 mmol/L (ref 98–111)
Creatinine, Ser: 1.1 mg/dL (ref 0.61–1.24)
GFR, Estimated: >60 mL/min (ref >60)
Glucose, Bld: 190 mg/dL — ABNORMAL HIGH (ref 70–99)
Potassium: 3.7 mmol/L (ref 3.5–5.1)
Sodium: 136 mmol/L (ref 135–145)
Total Bilirubin: 0.6 mg/dL (ref 0.0–1.2)
Total Protein: 7 g/dL (ref 6.5–8.1)

## 2023-04-25 LAB — CBC WITH DIFFERENTIAL/PLATELET
Abs Immature Granulocytes: 0.06 10*3/uL (ref 0.00–0.07)
Basophils Absolute: 0 10*3/uL (ref 0.0–0.1)
Basophils Relative: 0 %
Eosinophils Absolute: 0.1 10*3/uL (ref 0.0–0.5)
Eosinophils Relative: 1 %
HCT: 42.2 % (ref 39.0–52.0)
Hemoglobin: 13 g/dL (ref 13.0–17.0)
Immature Granulocytes: 1 %
Lymphocytes Relative: 10 %
Lymphs Abs: 0.9 10*3/uL (ref 0.7–4.0)
MCH: 28 pg (ref 26.0–34.0)
MCHC: 30.8 g/dL (ref 30.0–36.0)
MCV: 90.8 fL (ref 80.0–100.0)
Monocytes Absolute: 0.7 10*3/uL (ref 0.1–1.0)
Monocytes Relative: 8 %
Neutro Abs: 7.3 10*3/uL (ref 1.7–7.7)
Neutrophils Relative %: 80 %
Platelets: 208 10*3/uL (ref 150–400)
RBC: 4.65 MIL/uL (ref 4.22–5.81)
RDW: 14.4 % (ref 11.5–15.5)
WBC: 9.2 10*3/uL (ref 4.0–10.5)
nRBC: 0 % (ref 0.0–0.2)

## 2023-04-25 LAB — URINALYSIS, W/ REFLEX TO CULTURE (INFECTION SUSPECTED)
Bilirubin Urine: NEGATIVE
Glucose, UA: NEGATIVE mg/dL
Hgb urine dipstick: NEGATIVE
Ketones, ur: NEGATIVE mg/dL
Leukocytes,Ua: NEGATIVE
Nitrite: NEGATIVE
Protein, ur: 30 mg/dL — AB
Specific Gravity, Urine: 1.021 (ref 1.005–1.030)
pH: 5 (ref 5.0–8.0)

## 2023-04-25 LAB — BRAIN NATRIURETIC PEPTIDE: B Natriuretic Peptide: 106.6 pg/mL — ABNORMAL HIGH (ref 0.0–100.0)

## 2023-04-25 MED ORDER — IOHEXOL 350 MG/ML SOLN
100.0000 mL | Freq: Once | INTRAVENOUS | Status: AC | PRN
  Administered 2023-04-25: 100 mL via INTRAVENOUS

## 2023-04-25 NOTE — ED Triage Notes (Signed)
 EMS stated, Pt has bilateral groin pain for the last 2 days. Pt is SOB , has not taken his Lasix  today. On arrival to his house to move from wheelchair to stretcher his oxygen dropped to 90 , put him on 3 L and it came back up so we decreased the oxygen to 2 L and then to none. Pt had this trouble a few years back and had to have surgery, had a cyst there and almost burst.

## 2023-04-25 NOTE — ED Notes (Signed)
 Patient transported to CT

## 2023-04-25 NOTE — ED Provider Triage Note (Signed)
 Emergency Medicine Provider Triage Evaluation Note  Hunter [name] , a 74 y.o. male  was evaluated in triage.  Pt complains of groin pain..  Review of Systems  Positive: Groin pain Negative: Fevers  Physical Exam  BP (!) 184/69   Pulse 78   Temp 97.6 F (36.4 C) (Oral)   Resp (!) 21   SpO2 99%  Patient in chair.  Unable to get good perineal evaluation.   Medical Decision Making  Medically screening exam initiated at 10:28 AM.  Appropriate orders placed.  Hunter [name] was informed that the remainder of the evaluation will be completed by another provider, this initial triage assessment does not replace that evaluation, and the importance of remaining in the ED until their evaluation is complete.  Patient with perineal/groin pain.  Unable to examine due to being in chair.  Also some shortness of breath.  Will get blood work and get to exam room.   Hunter Arias, MD 04/25/23 1029

## 2023-04-25 NOTE — ED Provider Notes (Signed)
 [facility] Provider Note   CSN: [id number] Arrival date & time: 04/25/23  1010     History {Add pertinent medical, surgical, social history, OB history to HPI:1} Chief Complaint  Patient presents with   Groin Pain    Hunter [name] is a 73 y.o. male with past medical history HFpEF, CAD, COPD, type 2 diabetes, PVD s/p stents presents due to right groin pain.  Patient states that over the last few days, he has noticed pain with bearing weight onto the right leg.  He is wheelchair-bound, but able to move himself due to his ability to bear weight on his right leg.  Left leg has foot drop at baseline.  However, today he was unable to move himself due to worsening right leg pain.  Pain is located in the upper inner thigh near the groin.  Denies any fevers.  Denies any numbness or tingling to the leg.  Is concerned that this could be related to his stents.   Groin Pain       Home Medications Prior to Admission medications   Medication Sig Start Date End Date Taking? Authorizing Provider  albuterol  (PROVENTIL ) (2.5 MG/3ML) 0.083% nebulizer solution USE 1 VIAL IN NEBULIZER EVERY 6 HOURS - and as needed DX: J44.9 Patient taking differently: Take 2.5 mg by nebulization 2 (two) times daily. 01/02/22   Icard, Lucie Ruts, DO  albuterol  (VENTOLIN  HFA) 108 (90 Base) MCG/ACT inhaler Inhale 2 puffs into the lungs every 4-6 hours as needed. 11/12/22     alum hydroxide-mag trisilicate (GAVISCON) 80-20 MG CHEW chewable tablet Chew 2 tablets by mouth 3 (three) times daily as needed for indigestion or heartburn.    [provider]  amLODipine  (NORVASC ) 5 MG tablet Take 1 tablet (5 mg total) by mouth daily. 03/26/23     aspirin  81 MG tablet Take 81 mg by mouth at bedtime.    [provider]  budesonide -formoterol  (SYMBICORT ) 160-4.5 MCG/ACT inhaler Inhale 2 puffs into the lungs in the morning and at bedtime. 02/07/23   Denson Flake, MD   cetirizine (ZYRTEC) 10 MG tablet Take 10 mg by mouth daily.    [provider]  clopidogrel  (PLAVIX ) 75 MG tablet Take 1 tablet (75 mg total) by mouth daily. 02/28/23   Hugh Madura, MD  Continuous Glucose Sensor (FREESTYLE LIBRE 3 PLUS SENSOR) MISC 1 each every 14 (fourteen) days. 03/26/23     Continuous Glucose Sensor (FREESTYLE LIBRE 3 SENSOR) MISC Change sensor every 14 days to monitor blood glucose continuously. 04/27/22     docusate sodium  (COLACE) 100 MG capsule Take 100 mg by mouth daily.    [provider]  doxazosin  (CARDURA ) 2 MG tablet Take 1 tablet (2 mg total) by mouth daily. 01/30/23   Hugh Madura, MD  fluticasone  (CUTIVATE ) 0.005 % ointment Apply 1 application externally 2 (two) times daily as needed for irritation, not near eyes for 14 days 04/06/22     furosemide  (LASIX ) 40 MG tablet Take 1 tablet (40 mg total) by mouth 2 (two) times daily. 01/30/23   Hugh Madura, MD  gabapentin  (NEURONTIN ) 300 MG capsule Take 1 capsule (300 mg total) by mouth every morning AND 2 capsules (600 mg total) every evening. 02/27/23     glucose blood (ONETOUCH VERIO) test strip USE TO SELF MONITOR BLOOD GLUCOSE UP TO 4 TIMES DAILY (E11.65) 04/17/22     insulin  degludec (TRESIBA  FLEXTOUCH) 200 UNIT/ML FlexTouch Pen Inject 66  Units into the skin daily. 10/08/22     insulin  lispro (HUMALOG ) 100 UNIT/ML KwikPen Inject 30 Units into the skin 3 (three) times daily with each  meal. 12/05/22     Insulin  Pen Needle (TECHLITE PEN NEEDLES) 32G X 4 MM MISC Use  3 times daily with Humalog  and 1 time daily with Tresiba  07/09/22     ipratropium (ATROVENT ) 0.06 % nasal spray Place 1-2 sprays into both nostrils 3 (three) times daily as needed for congestion. 09/17/22     ketotifen (ZADITOR) 0.025 % ophthalmic solution Place 2 drops as needed into both eyes (for allergies).     [provider]  loperamide  (IMODIUM  A-D) 2 MG tablet Take 1 mg by mouth at bedtime.    [provider]   methocarbamol  (ROBAXIN ) 500 MG tablet Take 1 tablet (500 mg total) by mouth every evening. 07/09/22     metoprolol  tartrate (LOPRESSOR ) 50 MG tablet Take 1 tablet (50 mg total) by mouth 2 (two) times daily. 01/30/23   Hugh Madura, MD  nitroGLYCERIN  (NITROSTAT ) 0.4 MG SL tablet Place 1 tablet (0.4 mg total) under the tongue every 5 (five) minutes as needed for chest pain 03/25/23   Hugh Madura, MD  NOVOFINE 32G X 6 MM MISC USE ONCE DAILY WITH LEVEMIR  PEN 10/29/17   [provider]  omeprazole  (PRILOSEC) 40 MG capsule Take 1 capsule (40 mg total) by mouth 2 (two) times daily. 02/27/23     ONETOUCH VERIO test strip USE TO SELF MONITOR BLOOD GLUCOSE UP TO 4 TIMES DAILY (E11.65) 10/28/17   [provider]  Polyvinyl Alcohol-Povidone (REFRESH OP) Place 1 drop into both eyes daily as needed (dry eyes).    [provider]  potassium chloride  SA (KLOR-CON  M20) 20 MEQ tablet Take 1 tablet (20 mEq total) by mouth daily. 01/30/23   Hugh Madura, MD  Psyllium (METAMUCIL PO) Take 1 Scoop by mouth 2 (two) times daily.    [provider]  Respiratory Therapy Supplies (FLUTTER) DEVI Use as directed 11/14/17   Antonio Baumgarten, NP  rosuvastatin  (CRESTOR ) 20 MG tablet Take 1 tablet (20 mg total) by mouth daily. 01/30/23   Hugh Madura, MD  Semaglutide , 1 MG/DOSE, (OZEMPIC , 1 MG/DOSE,) 4 MG/3ML SOPN Inject 1 mg into the skin once a week. 09/10/22     traMADol  (ULTRAM ) 50 MG tablet Take 1 tablet (50 mg total) by mouth 3 (three) times daily as needed for pain 12/06/22     traMADol  (ULTRAM ) 50 MG tablet Take 1 tablet (50 mg total) by mouth 3 (three) times daily as needed for pain 03/26/23     triamterene -hydrochlorothiazide  (MAXZIDE -25) 37.5-25 MG tablet Take 1 tablet by mouth once a day, in the morning. 01/01/23     Vitamin D , Ergocalciferol , (DRISDOL ) 1.25 MG (50000 UNIT) CAPS capsule Take 1 capsule (50,000 Units total) by mouth 2 (two) times a week on Wednesday and Sunday. 12/06/22          Allergies    Chantix  [varenicline ], Codeine, Amitriptyline hcl, Atorvastatin , Glucophage [metformin], Ranolazine , Lisinopril, Ramipril, and Tape    Review of Systems   Review of Systems  Physical Exam Updated Vital Signs BP (!) 176/67 (BP Location: Right Wrist)   Pulse 65   Temp 98.2 F (36.8 C)   Resp 18   SpO2 97%  Physical Exam Vitals and nursing note reviewed.  Constitutional:      General: He is not in acute distress.    Appearance: He  is well-developed.  HENT:     Head: Normocephalic and atraumatic.  Eyes:     Conjunctiva/sclera: Conjunctivae normal.  Cardiovascular:     Rate and Rhythm: Normal rate and regular rhythm.     Heart sounds: No murmur heard.    Comments: Dopplered DP and PT pulses bilaterally Pulmonary:     Effort: Pulmonary effort is normal. No respiratory distress.     Breath sounds: Normal breath sounds.  Abdominal:     Palpations: Abdomen is soft.     Tenderness: There is no abdominal tenderness.  Musculoskeletal:        General: No swelling.     Cervical back: Neck supple.  Skin:    General: Skin is warm and dry.     Capillary Refill: Capillary refill takes less than 2 seconds.     Comments: No groin erythema, skin changes, or crepitus  Neurological:     Mental Status: He is alert.  Psychiatric:        Mood and Affect: Mood normal.     ED Results / Procedures / Treatments   Labs (all labs ordered are listed, but only abnormal results are displayed) Labs Reviewed  BRAIN NATRIURETIC PEPTIDE - Abnormal; Notable for the following components:      Result Value   B Natriuretic Peptide 106.6 (*)    All other components within normal limits  COMPREHENSIVE METABOLIC PANEL WITH GFR - Abnormal; Notable for the following components:   Glucose, Bld 190 (*)    All other components within normal limits  URINALYSIS, W/ REFLEX TO CULTURE (INFECTION SUSPECTED) - Abnormal; Notable for the following components:   Protein, ur 30 (*)    Bacteria,  UA RARE (*)    All other components within normal limits  CBC WITH DIFFERENTIAL/PLATELET    EKG None  Radiology No results found.  Procedures Procedures  {Document cardiac monitor, telemetry assessment procedure when appropriate:1}  Medications Ordered in ED Medications - No data to display  ED Course/ Medical Decision Making/ A&P   {   Click here for ABCD2, HEART and other calculatorsREFRESH Note before signing :1}                              Medical Decision Making Amount and/or Complexity of Data Reviewed Radiology: ordered.  Risk Prescription drug management.   Patient is alert, afebrile, and hemodynamically stable in no acute distress.  Physical exam as noted above.  Groin is without any erythema, crepitus, or skin changes to suggest cellulitis or necrotizing fasciitis.  There is pain to palpation of the right upper inner thigh with no overlying swelling or skin discoloration.  I am unable to palpate DP or PT pulses in his bilateral lower extremities, but they are warm and well-perfused.  On Doppler, there are PT and DP pulses. Differential includes fracture, DVT, arterial or vascular disease.  On chart review, patient does not have right-sided stents placed, so low concern for thrombosed stent causing his symptoms.  Will obtain CT angio to assess for any arterial abnormalities.  No asymmetry in lower extremities to suggest a DVT.  Labs obtained through triage demonstrate no leukocytosis, hemoglobin 13, unremarkable CMP, BUN slightly elevated at 106, urinalysis noninfectious.  Right hip x-ray with no acute fractures or dislocations.  I personally interpreted patient's chest x-ray, which demonstrated no obvious focal consolidations concerning for pneumonia, mild pulmonary edema. CT imaging resulted with atherosclerotic disease, severe stenosis noted to  the right superficial femoral artery with no distal flow to the femoral artery or popliteal artery.  There is runoff to the  ankles bilaterally, and left iliac artery and SFA are patent.  I spoke with vascular surgery on-call.  They do not feel acutely concerned about this, do not believe this is related to his pain and can follow-up outpatient.    {Document critical care time when appropriate:1} {Document review of labs and clinical decision tools ie heart score, Chads2Vasc2 etc:1}  {Document your independent review of radiology images, and any outside records:1} {Document your discussion with family members, caretakers, and with consultants:1} {Document social determinants of health affecting pt's care:1} {Document your decision making why or why not admission, treatments were needed:1} Final Clinical Impression(s) / ED Diagnoses Final diagnoses:  None    Rx / DC Orders ED Discharge Orders     None

## 2023-04-26 ENCOUNTER — Encounter (HOSPITAL_COMMUNITY): Payer: Self-pay | Admitting: Internal Medicine

## 2023-04-26 DIAGNOSIS — R1031 Right lower quadrant pain: Principal | ICD-10-CM

## 2023-04-26 DIAGNOSIS — Z8679 Personal history of other diseases of the circulatory system: Secondary | ICD-10-CM

## 2023-04-26 DIAGNOSIS — E7849 Other hyperlipidemia: Secondary | ICD-10-CM | POA: Diagnosis not present

## 2023-04-26 DIAGNOSIS — I739 Peripheral vascular disease, unspecified: Secondary | ICD-10-CM

## 2023-04-26 DIAGNOSIS — G609 Hereditary and idiopathic neuropathy, unspecified: Secondary | ICD-10-CM

## 2023-04-26 DIAGNOSIS — I10 Essential (primary) hypertension: Secondary | ICD-10-CM

## 2023-04-26 DIAGNOSIS — E119 Type 2 diabetes mellitus without complications: Secondary | ICD-10-CM

## 2023-04-26 DIAGNOSIS — G629 Polyneuropathy, unspecified: Secondary | ICD-10-CM

## 2023-04-26 DIAGNOSIS — Z794 Long term (current) use of insulin: Secondary | ICD-10-CM

## 2023-04-26 DIAGNOSIS — M7989 Other specified soft tissue disorders: Secondary | ICD-10-CM | POA: Insufficient documentation

## 2023-04-26 LAB — COMPREHENSIVE METABOLIC PANEL WITH GFR
ALT: 18 U/L (ref 0–44)
AST: 18 U/L (ref 15–41)
Albumin: 3.4 g/dL — ABNORMAL LOW (ref 3.5–5.0)
Alkaline Phosphatase: 41 U/L (ref 38–126)
Anion gap: 10 (ref 5–15)
BUN: 15 mg/dL (ref 8–23)
CO2: 24 mmol/L (ref 22–32)
Calcium: 8.8 mg/dL — ABNORMAL LOW (ref 8.9–10.3)
Chloride: 103 mmol/L (ref 98–111)
Creatinine, Ser: 0.96 mg/dL (ref 0.61–1.24)
GFR, Estimated: >60 mL/min (ref >60)
Glucose, Bld: 152 mg/dL — ABNORMAL HIGH (ref 70–99)
Potassium: 3.6 mmol/L (ref 3.5–5.1)
Sodium: 137 mmol/L (ref 135–145)
Total Bilirubin: 0.8 mg/dL (ref 0.0–1.2)
Total Protein: 6.7 g/dL (ref 6.5–8.1)

## 2023-04-26 LAB — GLUCOSE, CAPILLARY
Glucose-Capillary: 221 mg/dL — ABNORMAL HIGH (ref 70–99)
Glucose-Capillary: 154 mg/dL — ABNORMAL HIGH (ref 70–99)

## 2023-04-26 LAB — CBC
HCT: 40.6 % (ref 39.0–52.0)
Hemoglobin: 12.6 g/dL — ABNORMAL LOW (ref 13.0–17.0)
MCH: 27.9 pg (ref 26.0–34.0)
MCHC: 31 g/dL (ref 30.0–36.0)
MCV: 90 fL (ref 80.0–100.0)
Platelets: 200 10*3/uL (ref 150–400)
RBC: 4.51 MIL/uL (ref 4.22–5.81)
RDW: 14.6 % (ref 11.5–15.5)
WBC: 9.5 10*3/uL (ref 4.0–10.5)
nRBC: 0 % (ref 0.0–0.2)

## 2023-04-26 LAB — HEMOGLOBIN A1C
Hgb A1c MFr Bld: 7.3 % — ABNORMAL HIGH (ref 4.8–5.6)
Mean Plasma Glucose: 162.81 mg/dL

## 2023-04-26 LAB — CBG MONITORING, ED: Glucose-Capillary: 121 mg/dL — ABNORMAL HIGH (ref 70–99)

## 2023-04-26 MED ORDER — AMLODIPINE BESYLATE 5 MG PO TABS
5.0000 mg | ORAL_TABLET | Freq: Every day | ORAL | Status: DC
  Administered 2023-04-26 (×2): 5 mg via ORAL
  Filled 2023-04-26 (×2): qty 1

## 2023-04-26 MED ORDER — SODIUM CHLORIDE 0.9% FLUSH: 3.0000 mL | INTRAVENOUS | Status: DC | PRN

## 2023-04-26 MED ORDER — INSULIN GLARGINE-YFGN 100 UNIT/ML ~~LOC~~ SOLN
66.0000 [IU] | Freq: Every day | SUBCUTANEOUS | Status: DC
  Administered 2023-04-26: 66 [IU] via SUBCUTANEOUS
  Filled 2023-04-26: qty 0.66

## 2023-04-26 MED ORDER — ROSUVASTATIN CALCIUM 20 MG PO TABS
40.0000 mg | ORAL_TABLET | Freq: Every day | ORAL | Status: DC
Start: 2023-04-26 — End: 2023-04-26
  Administered 2023-04-26: 40 mg via ORAL
  Filled 2023-04-26: qty 2

## 2023-04-26 MED ORDER — ONDANSETRON HCL 4 MG/2ML IJ SOLN: 4.0000 mg | Freq: Four times a day (QID) | INTRAMUSCULAR | Status: DC | PRN

## 2023-04-26 MED ORDER — TRAMADOL HCL 50 MG PO TABS
50.0000 mg | ORAL_TABLET | Freq: Three times a day (TID) | ORAL | Status: DC | PRN
  Administered 2023-04-26: 50 mg via ORAL
  Filled 2023-04-26: qty 1

## 2023-04-26 MED ORDER — ROSUVASTATIN CALCIUM 20 MG PO TABS: 20.0000 mg | ORAL_TABLET | Freq: Every day | ORAL | Status: DC

## 2023-04-26 MED ORDER — INSULIN ASPART 100 UNIT/ML IJ SOLN: 0.0000 [IU] | Freq: Every day | INTRAMUSCULAR | Status: DC

## 2023-04-26 MED ORDER — ACETAMINOPHEN 650 MG RE SUPP: 650.0000 mg | Freq: Four times a day (QID) | RECTAL | Status: DC | PRN

## 2023-04-26 MED ORDER — ENOXAPARIN SODIUM 40 MG/0.4ML IJ SOSY: 40.0000 mg | PREFILLED_SYRINGE | INTRAMUSCULAR | Status: DC

## 2023-04-26 MED ORDER — IPRATROPIUM-ALBUTEROL 0.5-2.5 (3) MG/3ML IN SOLN: 3.0000 mL | RESPIRATORY_TRACT | Status: DC | PRN

## 2023-04-26 MED ORDER — TRIAMTERENE-HCTZ 37.5-25 MG PO TABS
1.0000 | ORAL_TABLET | Freq: Every day | ORAL | Status: DC
  Administered 2023-04-26: 1 via ORAL
  Filled 2023-04-26: qty 1

## 2023-04-26 MED ORDER — ASPIRIN 81 MG PO TBEC
81.0000 mg | DELAYED_RELEASE_TABLET | Freq: Every day | ORAL | Status: DC
  Administered 2023-04-26: 81 mg via ORAL
  Filled 2023-04-26: qty 1

## 2023-04-26 MED ORDER — METOPROLOL TARTRATE 50 MG PO TABS
50.0000 mg | ORAL_TABLET | Freq: Two times a day (BID) | ORAL | Status: DC
  Administered 2023-04-26 (×2): 50 mg via ORAL
  Filled 2023-04-26: qty 2
  Filled 2023-04-26: qty 1

## 2023-04-26 MED ORDER — GABAPENTIN 300 MG PO CAPS
600.0000 mg | ORAL_CAPSULE | Freq: Every evening | ORAL | Status: DC
  Administered 2023-04-26: 600 mg via ORAL
  Filled 2023-04-26: qty 2

## 2023-04-26 MED ORDER — CLOPIDOGREL BISULFATE 75 MG PO TABS
75.0000 mg | ORAL_TABLET | Freq: Every day | ORAL | Status: DC
  Administered 2023-04-26: 75 mg via ORAL
  Filled 2023-04-26: qty 1

## 2023-04-26 MED ORDER — PSYLLIUM 95 % PO PACK
1.0000 | PACK | Freq: Every day | ORAL | Status: DC
  Filled 2023-04-26: qty 1

## 2023-04-26 MED ORDER — PANTOPRAZOLE SODIUM 40 MG PO TBEC
40.0000 mg | DELAYED_RELEASE_TABLET | Freq: Every day | ORAL | Status: DC
  Administered 2023-04-26: 40 mg via ORAL

## 2023-04-26 MED ORDER — SODIUM CHLORIDE 0.9 % IV SOLN: 250.0000 mL | INTRAVENOUS | Status: DC | PRN

## 2023-04-26 MED ORDER — MOMETASONE FURO-FORMOTEROL FUM 200-5 MCG/ACT IN AERO
2.0000 | INHALATION_SPRAY | Freq: Two times a day (BID) | RESPIRATORY_TRACT | Status: DC
  Administered 2023-04-26: 2 via RESPIRATORY_TRACT
  Filled 2023-04-26: qty 8.8

## 2023-04-26 MED ORDER — ONDANSETRON HCL 4 MG PO TABS: 4.0000 mg | ORAL_TABLET | Freq: Four times a day (QID) | ORAL | Status: DC | PRN

## 2023-04-26 MED ORDER — DOXAZOSIN MESYLATE 2 MG PO TABS
2.0000 mg | ORAL_TABLET | Freq: Every day | ORAL | Status: DC
  Administered 2023-04-26: 2 mg via ORAL
  Filled 2023-04-26: qty 1

## 2023-04-26 MED ORDER — ACETAMINOPHEN 325 MG PO TABS: 650.0000 mg | ORAL_TABLET | Freq: Four times a day (QID) | ORAL | Status: DC | PRN

## 2023-04-26 MED ORDER — FUROSEMIDE 40 MG PO TABS
40.0000 mg | ORAL_TABLET | Freq: Two times a day (BID) | ORAL | Status: DC
  Filled 2023-04-26: qty 1

## 2023-04-26 MED ORDER — GABAPENTIN 300 MG PO CAPS
300.0000 mg | ORAL_CAPSULE | Freq: Every morning | ORAL | Status: DC
  Administered 2023-04-26: 300 mg via ORAL
  Filled 2023-04-26: qty 1

## 2023-04-26 MED ORDER — METHOCARBAMOL 500 MG PO TABS
500.0000 mg | ORAL_TABLET | Freq: Every evening | ORAL | Status: DC
  Administered 2023-04-26: 500 mg via ORAL
  Filled 2023-04-26: qty 1

## 2023-04-26 MED ORDER — SODIUM CHLORIDE 0.9% FLUSH
3.0000 mL | Freq: Two times a day (BID) | INTRAVENOUS | Status: DC
  Administered 2023-04-26 (×2): 3 mL via INTRAVENOUS

## 2023-04-26 MED ORDER — INSULIN LISPRO (1 UNIT DIAL) 100 UNIT/ML (KWIKPEN): 30.0000 [IU] | PEN_INJECTOR | Freq: Three times a day (TID) | SUBCUTANEOUS | Status: DC

## 2023-04-26 MED ORDER — NITROGLYCERIN 0.4 MG SL SUBL: 0.4000 mg | SUBLINGUAL_TABLET | SUBLINGUAL | Status: DC | PRN

## 2023-04-26 MED ORDER — AMLODIPINE BESYLATE 5 MG PO TABS: 5.0000 mg | ORAL_TABLET | Freq: Every day | ORAL | Status: DC

## 2023-04-26 MED ORDER — INSULIN ASPART 100 UNIT/ML IJ SOLN
0.0000 [IU] | Freq: Three times a day (TID) | INTRAMUSCULAR | Status: DC
  Administered 2023-04-26: 4 [IU] via SUBCUTANEOUS
  Administered 2023-04-26: 7 [IU] via SUBCUTANEOUS

## 2023-04-26 NOTE — Care Management Obs Status (Signed)
 MEDICARE OBSERVATION STATUS NOTIFICATION   Patient Details  Name: XZAVION DOSWELL MRN: [id number] Date of Birth: 19-Apr-1950   Medicare Observation Status Notification Given:  Yes    Tom-Johnson, Angelique Ken, RN 04/26/2023, 12:56 PM

## 2023-04-26 NOTE — ED Notes (Signed)
 Called 66M to advise patient in route

## 2023-04-26 NOTE — Evaluation (Addendum)
 Physical Therapy Evaluation Patient Details Name: [name] [name] MRN: [id number] DOB: 27-Feb-1950 Today's Date: 04/26/2023  History of Present Illness  [name] [name] is a 73 y.o. male admitted 4/24 presented to emergency department complaining of right-sided groin pain. PMH: peripheral artery disease s/p left iliac and superficial femoral artery stent, COPD, insulin -dependent DM type II, CAD s/p CABG, grade 2 diastolic heart failure with preserved EF 50 to 55%, essential hypertension, peripheral neuropathy, generalized anxiety disorder, hyperlipidemia, morbidly obese and benign tumor of the cauda equina  Clinical Impression  Pt admitted with above diagnosis. Pt was able to transfer to recliner with use of RW with supervision only. Pt has set up at home he states that works for him and attempts made to replicate for this transfer. Pt is most likely close to baseline and did not assist today but only supervised transfer. Pt states his right groin is much better and is anxious to go home. Does not need f/u PT and has all equipment. Will follow while in hospital to ensure mobility.  Pt currently with functional limitations due to the deficits listed below (see PT Problem List). Pt will benefit from acute skilled PT to increase their independence and safety with mobility to allow discharge.           If plan is discharge home, recommend the following: Assist for transportation;Help with stairs or ramp for entrance   Can travel by private vehicle        Equipment Recommendations None recommended by PT  Recommendations for Other Services       Functional Status Assessment Patient has not had a recent decline in their functional status     Precautions / Restrictions Precautions Precautions: Fall Precaution/Restrictions Comments: hx of left foot drop Restrictions Weight Bearing Restrictions Per Provider Order: No      Mobility  Bed Mobility Overal bed mobility: Independent                   Transfers Overall transfer level: Needs assistance Equipment used: Rolling walker (2 wheels) Transfers: Sit to/from Stand, Bed to chair/wheelchair/BSC Sit to Stand: Supervision Stand pivot transfers: Supervision         General transfer comment: No assist needed with pt standing to RW and pivoting    Ambulation/Gait               General Gait Details: doesnt walk at baseline  Stairs            Wheelchair Mobility     Tilt Bed    Modified Rankin (Stroke Patients Only)       Balance Overall balance assessment: Needs assistance Sitting-balance support: No upper extremity supported, Feet supported Sitting balance-Leahy Scale: Fair     Standing balance support: Bilateral upper extremity supported, During functional activity Standing balance-Leahy Scale: Poor Standing balance comment: relies on RW for balance                             Pertinent Vitals/Pain Pain Assessment Pain Assessment: Faces Faces Pain Scale: Hurts even more Pain Location: right groin/hamstring Pain Descriptors / Indicators: Discomfort Pain Intervention(s): Limited activity within patient's tolerance, Monitored during session, Repositioned    Home Living Family/patient expects to be discharged to:: Private residence Living Arrangements: Spouse/significant other Available Help at Discharge: Family;Available PRN/intermittently (wife works 8 hours day) Type of Home: House Home Access: Ramped entrance       Home Layout: One level  Home Equipment: Agricultural consultant (2 wheels);Wheelchair - power;Adaptive equipment      Prior Function Prior Level of Function : Independent/Modified Independent             Mobility Comments: stands and pivots to wheelchair with set up of walkers ADLs Comments: wife there when he gets on tub bench, pt can B/D and cook for self     Extremity/Trunk Assessment   Upper Extremity Assessment Upper Extremity Assessment: Defer  to OT evaluation    Lower Extremity Assessment Lower Extremity Assessment: LLE deficits/detail LLE Deficits / Details: left foot drop premorbid    Cervical / Trunk Assessment Cervical / Trunk Assessment: Kyphotic  Communication   Communication Communication: No apparent difficulties    Cognition Arousal: Alert Behavior During Therapy: WFL for tasks assessed/performed   PT - Cognitive impairments: No apparent impairments                         Following commands: Intact       Cueing       General Comments General comments (skin integrity, edema, etc.): VSS    Exercises     Assessment/Plan    PT Assessment Patient needs continued PT services  PT Problem List Decreased mobility       PT Treatment Interventions DME instruction;Therapeutic activities;Functional mobility training;Therapeutic exercise    PT Goals (Current goals can be found in the Care Plan section)  Acute Rehab PT Goals Patient Stated Goal: to go home PT Goal Formulation: With patient Time For Goal Achievement: 05/10/23 Potential to Achieve Goals: Good    Frequency Min 1X/week     Co-evaluation               AM-PAC PT "6 Clicks" Mobility  Outcome Measure Help needed turning from your back to your side while in a flat bed without using bedrails?: None Help needed moving from lying on your back to sitting on the side of a flat bed without using bedrails?: None Help needed moving to and from a bed to a chair (including a wheelchair)?: A Little Help needed standing up from a chair using your arms (e.g., wheelchair or bedside chair)?: A Little Help needed to walk in hospital room?: Total Help needed climbing 3-5 steps with a railing? : Total 6 Click Score: 16    End of Session Equipment Utilized During Treatment: Gait belt Activity Tolerance: Patient tolerated treatment well Patient left: in chair;with call bell/phone within reach;with chair alarm set Nurse Communication:  Mobility status PT Visit Diagnosis: Muscle weakness (generalized) (M62.81)    Time: 6213-0865 PT Time Calculation (min) (ACUTE ONLY): 18 min   Charges:   PT Evaluation $PT Eval Low Complexity: 1 Low   PT General Charges $$ ACUTE PT VISIT: 1 Visit         [name] [name] M,PT Acute Rehab Services [phone]   [name] [name] 04/26/2023, 12:44 PM
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
## 2023-04-26 NOTE — Discharge Summary (Signed)
 Physician Discharge Summary  STEPHANOS FAN WUJ:[id number] DOB: 06/03/1950 DOA: 04/25/2023  PCP: Bertha Broad, MD  Admit date: 04/25/2023 Discharge date: 04/26/2023  Admitted From: Home Disposition: Home  Recommendations for Outpatient Follow-up:  Follow up with PCP in 1-2 weeks Follow-up with vascular surgery as scheduled  Home Health: None Equipment/Devices: None  Discharge Condition: Stable CODE STATUS: Full Diet recommendation: Low-salt low-fat low-carb diet  Brief/Interim Summary: MELINDA POTTINGER is a 73 y.o. male with medical history significant of peripheral artery disease s/p left iliac and superficial femoral artery stent, COPD, insulin -dependent DM type II, CAD s/p CABG, grade 2 diastolic heart failure with preserved EF 50 to 55%, essential hypertension, peripheral neuropathy, generalized anxiety disorder, hyperlipidemia, morbidly obese and benign tumor of the cauda equina presented emergency department complaining of right-sided groin pain.  Patient admitted for acute right-sided groin pain with concern for amatory dysfunction however today with PT patient is at baseline without any further symptoms or complaints.  Otherwise stable and agreeable for discharge home  Discharge Diagnoses:  Principal Problem:   Right groin pain Active Problems:   Insulin  dependent type 2 diabetes mellitus (HCC)   Essential hypertension   Hyperlipidemia   Swelling of right lower extremity   History of CAD (coronary artery disease)   Peripheral neuropathy   Peripheral artery disease (HCC)  Discharge Instructions   Allergies as of 04/26/2023       Reactions   Chantix  [varenicline ] Shortness Of Breath   Codeine Nausea And Vomiting, Other (See Comments)   Severe stomach cramps   Amitriptyline Hcl    Unknown reaction   Atorvastatin     Increase LFT's   Glucophage [metformin] Diarrhea, Nausea And Vomiting   Ranolazine     "nauseated and dizziness"   Lisinopril Cough    Ramipril Cough   Tape Other (See Comments)   Only plastic tape--can use paper tape OK. Blisters        Medication List     TAKE these medications    albuterol  (2.5 MG/3ML) 0.083% nebulizer solution Commonly known as: PROVENTIL  USE 1 VIAL IN NEBULIZER EVERY 6 HOURS - and as needed DX: J44.9 What changed:  how much to take how to take this when to take this additional instructions   albuterol  108 (90 Base) MCG/ACT inhaler Commonly known as: VENTOLIN  HFA Inhale 2 puffs into the lungs every 4-6 hours as needed. What changed: Another medication with the same name was changed. Make sure you understand how and when to take each.   alum hydroxide-mag trisilicate 80-20 MG Chew chewable tablet Commonly known as: GAVISCON Chew 2 tablets by mouth 3 (three) times daily as needed for indigestion or heartburn.   amLODipine  5 MG tablet Commonly known as: NORVASC  Take 1 tablet (5 mg total) by mouth daily.   aspirin  81 MG tablet Take 81 mg by mouth at bedtime.   cetirizine 10 MG tablet Commonly known as: ZYRTEC Take 10 mg by mouth daily as needed for allergies.   clopidogrel  75 MG tablet Commonly known as: PLAVIX  Take 1 tablet (75 mg total) by mouth daily.   docusate sodium  100 MG capsule Commonly known as: COLACE Take 100 mg by mouth daily.   doxazosin  2 MG tablet Commonly known as: CARDURA  Take 1 tablet (2 mg total) by mouth daily.   fluticasone  0.005 % ointment Commonly known as: CUTIVATE  Apply 1 application externally 2 (two) times daily as needed for irritation, not near eyes for 14 days   Flutter Devi Use as directed  FreeStyle Libre 3 Sensor Misc Change sensor every 14 days to monitor blood glucose continuously.   FreeStyle Libre 3 Plus Sensor Misc 1 each every 14 (fourteen) days.   furosemide  40 MG tablet Commonly known as: LASIX  Take 1 tablet (40 mg total) by mouth 2 (two) times daily. What changed: when to take this   gabapentin  300 MG capsule Commonly  known as: NEURONTIN  Take 1 capsule (300 mg total) by mouth every morning AND 2 capsules (600 mg total) every evening.   insulin  lispro 100 UNIT/ML KwikPen Commonly known as: HUMALOG  Inject 30 Units into the skin 3 (three) times daily with each  meal.   ipratropium 0.06 % nasal spray Commonly known as: ATROVENT  Place 1-2 sprays into both nostrils 3 (three) times daily as needed for congestion.   ketotifen 0.025 % ophthalmic solution Commonly known as: ZADITOR Place 2 drops as needed into both eyes (for allergies).   loperamide  2 MG tablet Commonly known as: IMODIUM  A-D Take 1 mg by mouth every other day.   METAMUCIL PO Take 1 Scoop by mouth 2 (two) times daily.   methocarbamol  500 MG tablet Commonly known as: ROBAXIN  Take 1 tablet (500 mg total) by mouth every evening.   metoprolol  tartrate 50 MG tablet Commonly known as: LOPRESSOR  Take 1 tablet (50 mg total) by mouth 2 (two) times daily.   nitroGLYCERIN  0.4 MG SL tablet Commonly known as: NITROSTAT  Place 1 tablet (0.4 mg total) under the tongue every 5 (five) minutes as needed for chest pain   NovoFine 32G X 6 MM Misc Generic drug: Insulin  Pen Needle USE ONCE DAILY WITH LEVEMIR  PEN   Insupen Pen Needles 32G X 4 MM Misc Generic drug: Insulin  Pen Needle Use  3 times daily with Humalog  and 1 time daily with Tresiba    omeprazole  40 MG capsule Commonly known as: PRILOSEC Take 1 capsule (40 mg total) by mouth 2 (two) times daily.   OneTouch Verio test strip Generic drug: glucose blood USE TO SELF MONITOR BLOOD GLUCOSE UP TO 4 TIMES DAILY (E11.65)   OneTouch Verio test strip Generic drug: glucose blood USE TO SELF MONITOR BLOOD GLUCOSE UP TO 4 TIMES DAILY (E11.65)   Ozempic  (1 MG/DOSE) 4 MG/3ML Sopn Generic drug: Semaglutide  (1 MG/DOSE) Inject 1 mg into the skin once a week.   potassium chloride  SA 20 MEQ tablet Commonly known as: Klor-Con  M20 Take 1 tablet (20 mEq total) by mouth daily.   REFRESH OP Place 1  drop into both eyes daily as needed (dry eyes).   rosuvastatin  20 MG tablet Commonly known as: CRESTOR  Take 1 tablet (20 mg total) by mouth daily.   Symbicort  160-4.5 MCG/ACT inhaler Generic drug: budesonide -formoterol  Inhale 2 puffs into the lungs in the morning and at bedtime.   traMADol  50 MG tablet Commonly known as: ULTRAM  Take 1 tablet (50 mg total) by mouth 3 (three) times daily as needed for pain   traMADol  50 MG tablet Commonly known as: ULTRAM  Take 1 tablet (50 mg total) by mouth 3 (three) times daily as needed for pain   Tresiba  FlexTouch 200 UNIT/ML FlexTouch Pen Generic drug: insulin  degludec Inject 66 Units into the skin daily.   triamterene -hydrochlorothiazide  37.5-25 MG tablet Commonly known as: MAXZIDE -25 Take 1 tablet by mouth once a day, in the morning.   Vitamin D  (Ergocalciferol ) 1.25 MG (50000 UNIT) Caps capsule Commonly known as: DRISDOL  Take 1 capsule (50,000 Units total) by mouth 2 (two) times a week on Wednesday and Sunday.  Allergies  Allergen Reactions   Chantix  [Varenicline ] Shortness Of Breath   Codeine Nausea And Vomiting and Other (See Comments)    Severe stomach cramps   Amitriptyline Hcl     Unknown reaction   Atorvastatin      Increase LFT's   Glucophage [Metformin] Diarrhea and Nausea And Vomiting   Ranolazine      "nauseated and dizziness"   Lisinopril Cough   Ramipril Cough   Tape Other (See Comments)    Only plastic tape--can use paper tape OK. Blisters    Consultations: None   Procedures/Studies: CT Angio Aortobifemoral W and/or Wo Contrast Result Date: 04/25/2023 CLINICAL DATA:  Right groin pain.  Shortness of breath. EXAM: CT ANGIOGRAPHY OF ABDOMINAL AORTA WITH ILIOFEMORAL RUNOFF TECHNIQUE: Multidetector CT imaging of the abdomen, pelvis and lower extremities was performed using the standard protocol during bolus administration of intravenous contrast. Multiplanar CT image reconstructions and MIPs were obtained to  evaluate the vascular anatomy. RADIATION DOSE REDUCTION: This exam was performed according to the departmental dose-optimization program which includes automated exposure control, adjustment of the mA and/or kV according to patient size and/or use of iterative reconstruction technique. CONTRAST:  OMNIPAQUE  IOHEXOL  350 MG/ML SOLN COMPARISON:  None Available. FINDINGS: VASCULAR Aorta: Aorta is small in caliber, the infrarenal abdominal aorta measures 11 mm. There is moderate calcified atherosclerotic disease. There is no significant stenosis, aneurysm identified. Celiac: Patent without evidence of aneurysm, dissection, vasculitis or significant stenosis. There is calcified atherosclerotic disease at the origin likely causing mild stenosis. SMA: Patent without evidence of aneurysm, dissection, vasculitis or significant stenosis. There is calcified atherosclerotic disease at the origin causing mild stenosis. Renals: There is severe calcified atherosclerotic disease at the origins of the bilateral renal artery likely causing moderate stenosis. The arteries are otherwise patent without evidence for dissection, thrombosis or aneurysm. IMA: Not well opacified on this study due to small caliber. Calcified atherosclerotic disease present. RIGHT Lower Extremity Inflow: Small caliber common and external iliac arteries are present bilaterally. Severe atherosclerotic disease present. There are areas of moderate severe stenosis in the right common femoral artery. Outflow: Small caliber. Severe calcified atherosclerotic disease present. There are multiple areas of focal severe stenosis/occlusion throughout the mid and proximal right superficial femoral artery. No flow is identified in the distal superficial femoral artery or popliteal artery. Runoff: Patent three vessel runoff to the ankle. LEFT Lower Extremity Inflow: Left external iliac artery stent patent. Vessels are small in caliber and there is diffuse calcified  atherosclerotic disease. No evidence for dissection or thrombosis. Vessels are small in caliber. Outflow: Left common femoral artery small in caliber with atherosclerotic disease, but grossly patent. Left superficial femoral artery stent is present and appears patent. Popliteal artery patent. Runoff: Patent three vessel runoff to the ankle. Veins: No obvious venous abnormality within the limitations of this arterial phase study. Review of the MIP images confirms the above findings. NON-VASCULAR Lower chest: No acute abnormality. Hepatobiliary: No focal liver abnormality is seen. Status post cholecystectomy. No biliary dilatation. Pancreas: Unremarkable. No pancreatic ductal dilatation or surrounding inflammatory changes. Spleen: Normal in size without focal abnormality. Adrenals/Urinary Tract: Adrenal glands are unremarkable. Kidneys are normal, without renal calculi, focal lesion, or hydronephrosis. Bladder is unremarkable. Stomach/Bowel: Stomach is within normal limits. Appendix appears normal. No evidence of bowel wall thickening, distention, or inflammatory changes. Lymphatic: No enlarged lymph nodes are seen. Reproductive: Prostate gland is within normal limits. Penile pump is present. Other: There is a small fat containing left inguinal hernia. There  is diastasis and bulging of the anterior abdominal wall. There is no ascites. Musculoskeletal: There is diffuse subcutaneous edema and swelling of the left lower extremity from the level of the knee to the level of the ankle. No fluid collection or soft tissue gas identified. There is fatty atrophy of musculature in the left lower extremity. There is no acute osseous abnormality. There are cerclage wires in the proximal femur with callus formation and healed fracture. Disc spacer seen at L5-S1. IMPRESSION: VASCULAR a 1. Severe atherosclerotic disease of the aorta, iliac arteries, and lower extremities. Small caliber aorta, pelvic vessels and lower extremity  vessels diffusely. 2. Multiple areas of severe stenosis/occlusion throughout the right superficial femoral artery. No flow identified in the distal right superficial femoral artery or popliteal artery. 3. Patent left external iliac artery and superficial femoral artery stents. 4. Three-vessel runoff to the ankles bilaterally. NON-VASCULAR 1. Diffuse subcutaneous edema and swelling of the left lower extremity from the level of the knee to the level of the ankle. No fluid collection or soft tissue gas. 2. No acute localizing process in the abdomen or pelvis. Electronically Signed   By: Tyron Gallon M.D.   On: 04/25/2023 23:33   DG Hip Unilat W or Wo Pelvis 2-3 Views Right Result Date: 04/25/2023 CLINICAL DATA:  Bilateral groin pain EXAM: DG HIP (WITH OR WITHOUT PELVIS) 2-3V RIGHT COMPARISON:  None Available. FINDINGS: Vascular stent in the left pelvis and left thigh. Hardware at the lumbosacral region. Penile prosthesis. Pubic symphysis appears intact. Partially visualized femoral cerclage wires. Old appearing fracture deformity proximal shaft of the right femur and evidence of old hardware tract. No definite acute displaced fracture or malalignment. Mild bilateral hip arthritis. Extensive vascular calcifications IMPRESSION: No definite acute osseous abnormality. Old appearing fracture deformity proximal shaft of the right femur with evidence of old hardware tract. Mild bilateral hip arthritis. Electronically Signed   By: Esmeralda Hedge M.D.   On: 04/25/2023 21:37   DG Chest 1 View Result Date: 04/25/2023 CLINICAL DATA:  Shortness of breath EXAM: CHEST  1 VIEW COMPARISON:  11/14/2017 FINDINGS: Sternotomy. Mild cardiomegaly. Possible small right effusion. Difficult to exclude atelectasis or airspace disease at left base. No pneumothorax IMPRESSION: Mild cardiomegaly. Possible small right effusion. Difficult to exclude atelectasis or airspace disease at left base. Electronically Signed   By: Esmeralda Hedge M.D.    On: 04/25/2023 21:35   VAS US  AORTA/IVC/ILIACS Result Date: 04/15/2023 ABDOMINAL AORTA STUDY Patient Name:  BRANNEN KOPPEN  Date of Exam:   04/15/2023 Medical Rec #: [id number]           Accession #:    [id number] Date of Birth: 10/12/1950            Patient Gender: M Patient Age:   35 years Exam Location:  [facility] Procedure:      VAS US  AORTA/IVC/ILIACS Referring Phys: Genny Kid --------------------------------------------------------------------------------  Indications: Left common and external iliac stent evaluation Risk Factors: Diabetes, coronary artery disease. Limitations: Air/bowel gas, obesity, patient discomfort and extreme back pain, unable to properly position patient.  Performing Technologist: Homer Lust RVT  Examination Guidelines: A complete evaluation includes B-mode imaging, spectral Doppler, color Doppler, and power Doppler as needed of all accessible portions of each vessel. Bilateral testing is considered an integral part of a complete examination. Limited examinations for reoccurring indications may be performed as noted.  Abdominal Aorta Findings: +-------------+-------+----------+----------+--------+--------+--------+ Location     AP (cm)Trans (cm)PSV (cm/s)WaveformThrombusComments +-------------+-------+----------+----------+--------+--------+--------+ Distal  NV       +-------------+-------+----------+----------+--------+--------+--------+ RT CIA Prox                                             NV       +-------------+-------+----------+----------+--------+--------+--------+ RT CIA Mid                    194                                +-------------+-------+----------+----------+--------+--------+--------+ RT CIA Distal                 211       biphasic                 +-------------+-------+----------+----------+--------+--------+--------+ RT EIA Mid                     134       biphasic                 +-------------+-------+----------+----------+--------+--------+--------+ RT EIA Distal                 133       biphasic                 +-------------+-------+----------+----------+--------+--------+--------+ LT CIA Prox                   116                                +-------------+-------+----------+----------+--------+--------+--------+ LT EIA Prox                   188       biphasic                 +-------------+-------+----------+----------+--------+--------+--------+ LT EIA Mid                    292       biphasic                 +-------------+-------+----------+----------+--------+--------+--------+ LT EIA Distal                 283       biphasic                 +-------------+-------+----------+----------+--------+--------+--------+  Summary: IVC/Iliac: Right: Visualized segments of the common iliac and external iliac artery appear patent, suboptimal visualization. Left: Unable to visualized the common iliac stent. Increased velocity in the visualized segment of the left stent in the > 50% stenosis. NOTE: This was a suboptimal exam, limited visualization.  *See table(s) above for measurements and observations.  Electronically signed by Jimmye Moulds MD on 04/15/2023 at 2:17:23 PM.    Final    VAS US  LOWER EXTREMITY ARTERIAL DUPLEX Result Date: 04/15/2023 LOWER EXTREMITY ARTERIAL DUPLEX STUDY Patient Name:  DONTREZ PETTIS  Date of Exam:   04/15/2023 Medical Rec #: [id number]           Accession #:    [id number] Date of Birth: [birth year]            Patient Gender: M Patient Age:   73 years Exam Location:  [facility] Procedure:  VAS US  LOWER EXTREMITY ARTERIAL DUPLEX Referring Phys: Genny Kid --------------------------------------------------------------------------------  Indications: Peripheral artery disease. High Risk Factors: Hypertension, hyperlipidemia, Diabetes, coronary artery                     disease. Other Factors: 08/07/2022 Abdominal aortogram, Stents in the left SFA and                popliteal are pateint. Patent left iliac stent.  Vascular Interventions: 03/13/2022                         Pre-operative Diagnosis: Left toe ulcer                         Post-operative diagnosis: Same                         Surgeon: Gareld June                         Procedure Performed:                         1. Ultrasound-guided access, right femoral artery                         2. Abdominal aortogram                         3. Left lower extremity runoff                         4. Stent, left superficial femoral-popliteal artery                         5. Stent, left external iliac artery                         6. Stent, left common iliac artery                         7. Conscious sedation, 108 minutes. Current ABI:            Bilateral ABI /15/24 [place] .( Biphasic and triphasic                         waveforms on the left per prior exam). Limitations: Extreme back pain. Patient unable to be properly positioned. Body              habitus. Performing Technologist: Homer Lust RVT  Examination Guidelines: A complete evaluation includes B-mode imaging, spectral Doppler, color Doppler, and power Doppler as needed of all accessible portions of each vessel. Bilateral testing is considered an integral part of a complete examination. Limited examinations for reoccurring indications may be performed as noted.   Right Stent(s): +-----------------+--------+---------------+---------+--------+ SFA and pop stentPSV cm/sStenosis       Waveform Comments +-----------------+--------+---------------+---------+--------+ Prox to Stent    271     50-99% stenosistriphasic         +-----------------+--------+---------------+---------+--------+ Proximal Stent   425     50-99% stenosisbiphasic          +-----------------+--------+---------------+---------+--------+ Mid Stent        40  biphasic          +-----------------+--------+---------------+---------+--------+ Distal Stent     57                     biphasic          +-----------------+--------+---------------+---------+--------+ Distal to Stent  90                     biphasic          +-----------------+--------+---------------+---------+--------+    +----------+--------+-----+--------+----------+--------+ LEFT      PSV cm/sRatioStenosisWaveform  Comments +----------+--------+-----+--------+----------+--------+ EIA Prox  301                  biphasic           +----------+--------+-----+--------+----------+--------+ CFA Prox  257                  biphasic           +----------+--------+-----+--------+----------+--------+ POP Mid   69                   biphasic           +----------+--------+-----+--------+----------+--------+ ATA Prox  30                   monophasicbrisk    +----------+--------+-----+--------+----------+--------+ PTA Distal28                   biphasic           +----------+--------+-----+--------+----------+--------+  Summary: Left: Patent stent with increased velocity in the 75 - 99 % stenosis range.  See table(s) above for measurements and observations. Electronically signed by Jimmye Moulds MD on 04/15/2023 at 2:15:53 PM.    Final      Subjective: No acute issues/events overnight   Discharge Exam: Vitals:   04/26/23 0914 04/26/23 1210  BP:  (!) 152/68  Pulse: 74 71  Resp: 16 16  Temp:  98.3 F (36.8 C)  SpO2: 98% 96%   Vitals:   04/26/23 0459 04/26/23 0804 04/26/23 0914 04/26/23 1210  BP: (!) 144/61 (!) 155/65  (!) 152/68  Pulse: 65 73 74 71  Resp: 18 16 16 16   Temp: 98.4 F (36.9 C) 97.8 F (36.6 C)  98.3 F (36.8 C)  TempSrc: Oral Oral  Oral  SpO2: 100% 99% 98% 96%  Height:        General: Pt is alert, awake, not in acute distress Cardiovascular: RRR, S1/S2 +, no rubs, no gallops Respiratory: CTA bilaterally, no  wheezing, no rhonchi Abdominal: Soft, NT, ND, bowel sounds + Extremities: no edema, no cyanosis    The results of significant diagnostics from this hospitalization (including imaging, microbiology, ancillary and laboratory) are listed below for reference.     Microbiology: No results found for this or any previous visit (from the past 240 hours).   Labs: BNP (last 3 results) Recent Labs    04/25/23 1318  BNP 106.6*   Basic Metabolic Panel: Recent Labs  Lab 04/25/23 1318 04/26/23 0605  NA 136 137  K 3.7 3.6  CL 100 103  CO2 26 24  GLUCOSE 190* 152*  BUN 19 15  CREATININE 1.10 0.96  CALCIUM  8.9 8.8*   Liver Function Tests: Recent Labs  Lab 04/25/23 1318 04/26/23 0605  AST 18 18  ALT 16 18  ALKPHOS 40 41  BILITOT 0.6 0.8  PROT 7.0 6.7  ALBUMIN 3.6 3.4*   No results for input(s): "LIPASE", "AMYLASE" in  the last 168 hours. No results for input(s): "AMMONIA" in the last 168 hours. CBC: Recent Labs  Lab 04/25/23 1318 04/26/23 0605  WBC 9.2 9.5  NEUTROABS 7.3  --   HGB 13.0 12.6*  HCT 42.2 40.6  MCV 90.8 90.0  PLT 208 200   Cardiac Enzymes: No results for input(s): "CKTOTAL", "CKMB", "CKMBINDEX", "TROPONINI" in the last 168 hours. BNP: Invalid input(s): "POCBNP" CBG: Recent Labs  Lab 04/26/23 0120 04/26/23 0827 04/26/23 1139  GLUCAP 121* 221* 154*   D-Dimer No results for input(s): "DDIMER" in the last 72 hours. Hgb A1c Recent Labs    04/26/23 0605  HGBA1C 7.3*   Lipid Profile No results for input(s): "CHOL", "HDL", "LDLCALC", "TRIG", "CHOLHDL", "LDLDIRECT" in the last 72 hours. Thyroid function studies No results for input(s): "TSH", "T4TOTAL", "T3FREE", "THYROIDAB" in the last 72 hours.  Invalid input(s): "FREET3" Anemia work up No results for input(s): "VITAMINB12", "FOLATE", "FERRITIN", "TIBC", "IRON", "RETICCTPCT" in the last 72 hours. Urinalysis    Component Value Date/Time   COLORURINE YELLOW 04/25/2023 1650   APPEARANCEUR  CLEAR 04/25/2023 1650   LABSPEC 1.021 04/25/2023 1650   PHURINE 5.0 04/25/2023 1650   GLUCOSEU NEGATIVE 04/25/2023 1650   HGBUR NEGATIVE 04/25/2023 1650   BILIRUBINUR NEGATIVE 04/25/2023 1650   KETONESUR NEGATIVE 04/25/2023 1650   PROTEINUR 30 (A) 04/25/2023 1650   NITRITE NEGATIVE 04/25/2023 1650   LEUKOCYTESUR NEGATIVE 04/25/2023 1650   Sepsis Labs Recent Labs  Lab 04/25/23 1318 04/26/23 0605  WBC 9.2 9.5   Microbiology No results found for this or any previous visit (from the past 240 hours).   Time coordinating discharge: Over 30 minutes  SIGNED:   Haydee Lipa, DO [facility] 04/26/2023, 3:55 PM Pager   If 7PM-7AM, please contact night-coverage [email or web address]

## 2023-04-26 NOTE — TOC Transition Note (Signed)
 Transition of Care [facility]) - Discharge Note   Patient Details  Name: Hunter [name] MRN: [id number] Date of Birth: 25-Dec-1950  Transition [facility]) CM/SW Contact:  Tom-Johnson, Angelique Ken, RN Phone Number: 04/26/2023, 1:25 PM   Clinical Narrative:     Patient is scheduled for discharge today.  Outpatient f/u, hospital f/u and discharge instructions on AVS. No TOC needs or recommendations noted. Wife, Cappie to transport at discharge.  No further TOC needs noted.         Final next level of care: Home/Self Care Barriers to Discharge: Barriers Resolved   Patient Goals and CMS Choice Patient states their goals for this hospitalization and ongoing recovery are:: To return home CMS Medicare.gov Compare Post Acute Care list provided to:: Patient Choice offered to / list presented to : NA      Discharge Placement                Patient to be transferred to facility by: Wife Name of family member notified: Cappie    Discharge Plan and Services Additional resources added to the After Visit Summary for                  DME Arranged: N/A DME Agency: NA       HH Arranged: NA HH Agency: NA        Social Drivers of Health (SDOH) Interventions SDOH Screenings   Food Insecurity: No Food Insecurity (04/26/2023)  Housing: Unknown (04/26/2023)  Transportation Needs: No Transportation Needs (04/26/2023)  Utilities: Not At Risk (04/26/2023)  Social Connections: Unknown (04/26/2023)  Tobacco Use: Medium Risk (04/26/2023)     Readmission Risk Interventions     No data to display

## 2023-04-26 NOTE — H&P (Signed)
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  Bilateral groin pain EXAM: DG HIP (WITH OR WITHOUT PELVIS) 2-3V RIGHT COMPARISON:  None Available. FINDINGS: Vascular stent in the left pelvis and left thigh. Hardware at the lumbosacral region. Penile prosthesis. Pubic symphysis appears intact. Partially visualized femoral cerclage wires. Old appearing fracture deformity proximal shaft of the right femur and evidence of old hardware tract. No definite acute displaced fracture or malalignment. Mild bilateral hip arthritis. Extensive vascular calcifications IMPRESSION: No definite acute osseous abnormality. Old appearing fracture deformity proximal shaft of the right femur with evidence of old hardware tract. Mild bilateral hip arthritis. Electronically Signed   By: Esmeralda Hedge M.D.   On: 04/25/2023 21:37   DG Chest 1 View Result Date:  04/25/2023 CLINICAL DATA:  Shortness of breath EXAM: CHEST  1 VIEW COMPARISON:  11/14/2017 FINDINGS: Sternotomy. Mild cardiomegaly. Possible small right effusion. Difficult to exclude atelectasis or airspace disease at left base. No pneumothorax IMPRESSION: Mild cardiomegaly. Possible small right effusion. Difficult to exclude atelectasis or airspace disease at left base. Electronically Signed   By: Esmeralda Hedge M.D.   On: 04/25/2023 21:35     Assessment/Plan: Principal Problem:   Right groin pain Active Problems:   Insulin  dependent type 2 diabetes mellitus (HCC)   Essential hypertension   Hyperlipidemia   Swelling of right lower extremity   History of CAD (coronary artery disease)   Peripheral neuropathy   Peripheral artery disease (HCC)    Assessment and Plan: Right groin pain-secondary to underlying severe PAD History of severe PAD s/p left external iliac and superficial femoral artery stent Severe PAD of right lower extremity -Patient present emergency department complaining of right-sided groin pain, progressively unwilling to bear weight on the right sided extremity.  At baseline patient is wheelchair-bound and left-sided foot drop.  He is able to transfer him to wheelchair to bed with help of right-sided lower extremity. 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 - CT angio runoff of bilateral lower extremity showed patent left internal iliac artery stent, left superficial femoral artery stent patent.  Left common femoral artery arthrosclerosis disease however grossly patent. Severe atherosclerotic disease of the aorta, iliac arteries, and lower extremities. Small caliber aorta, pelvic vessels and lower extremity vessels diffusely. Multiple areas of severe stenosis/occlusion throughout the right superficial femoral artery. No flow identified in the distal right superficial femoral  artery or popliteal artery. -Doppler platelike lower extremity distal pulses are intact of the right lower extremity. - Given patient has significant peripheral vascular disease Case has been discussed  with on-call vascular surgery Dr. Edgardo Goodwill who recommended continue medical management and per vascular do not feel acutely concerned at this time and patient should follow-up with outpatient vascular surgery on discharge. -Given patient has limiting mobility due to right-sided groin pain admitting patient for observation for pain control, and inpatient PT and OT therapy. -Continue home tramadol  50 mg 3 times daily as needed for moderate-severe pain and Robaxin . -Continue aspirin , Plavix  and increasing dose of Crestor  20 to 40 mg.   History of CAD status post CABG -Continue metoprolol , aspirin , Plavix  and Crestor .  Insulin -dependent DM type II Continue long-acting insulin  60 units in the morning and high sliding scale insulin  with meals.  Essential hypertension Diastolic heart failure with preserved EF 50 to 55% -Continue amlodipine , doxazosin , Maxzide ,, asix and Lopressor .   Peripheral neuropathy -Continue gabapentin  and tramadol   COPD - Continue DuoNeb as needed and Dulera  twice daily   DVT prophylaxis:  Lovenox Code Status:  Full Code Diet: Heart healthy/carb modified Family Communication: Currently no family member at bedside Disposition Plan: Plan to DC to home versus SNF based on PT OT evaluation Consults: PT, OT Admission status:   Observation, Telemetry bed  Severity of Illness: The appropriate patient status for this patient is OBSERVATION. Observation status is judged to be reasonable and necessary in order to provide the required intensity of service to ensure the patient's safety. The patient's presenting symptoms, physical exam findings, and initial radiographic and laboratory data in the context of their medical condition is felt to place them at decreased risk for  further clinical deterioration. Furthermore, it is anticipated that the patient will be medically stable for discharge from the hospital within 2 midnights of admission.     Malkia Nippert, MD [facility]  How to contact the [facility] Attending or Consulting provider 7A - 7P or covering provider during after hours 7P -7A, for this patient.  Check the care team in [facility] and look for a) attending/consulting [facility] provider listed and b) the [facility] team listed Log into [email or web address] and use [place]'s universal password to access. If you do not have the password, please contact the hospital operator. Locate the [facility] provider you are looking for under [facility] and page to a number that you can be directly reached. If you still have difficulty reaching the provider, please page the [facility] (Director on Call) for the Hospitalists listed on amion for assistance.  04/26/2023, 12:48 AM

## 2023-04-27 ENCOUNTER — Emergency Department (HOSPITAL_COMMUNITY)

## 2023-04-27 ENCOUNTER — Emergency Department (HOSPITAL_COMMUNITY)
Admission: EM | Admit: 2023-04-27 | Discharge: 2023-04-27 | Disposition: A | Discharge status: Home / Self Care | Attending: Emergency Medicine | Admitting: Emergency Medicine

## 2023-04-27 ENCOUNTER — Other Ambulatory Visit: Payer: Self-pay

## 2023-04-27 ENCOUNTER — Encounter (HOSPITAL_COMMUNITY): Payer: Self-pay

## 2023-04-27 DIAGNOSIS — W050XXA Fall from non-moving wheelchair, initial encounter: Secondary | ICD-10-CM | POA: Diagnosis not present

## 2023-04-27 DIAGNOSIS — Z7982 Long term (current) use of aspirin: Secondary | ICD-10-CM | POA: Insufficient documentation

## 2023-04-27 DIAGNOSIS — S63287A Dislocation of proximal interphalangeal joint of left little finger, initial encounter: Secondary | ICD-10-CM | POA: Insufficient documentation

## 2023-04-27 DIAGNOSIS — Z794 Long term (current) use of insulin: Secondary | ICD-10-CM | POA: Diagnosis not present

## 2023-04-27 DIAGNOSIS — E119 Type 2 diabetes mellitus without complications: Secondary | ICD-10-CM | POA: Diagnosis not present

## 2023-04-27 DIAGNOSIS — S63285A Dislocation of proximal interphalangeal joint of left ring finger, initial encounter: Secondary | ICD-10-CM | POA: Diagnosis not present

## 2023-04-27 DIAGNOSIS — Z23 Encounter for immunization: Secondary | ICD-10-CM | POA: Insufficient documentation

## 2023-04-27 DIAGNOSIS — S6992XA Unspecified injury of left wrist, hand and finger(s), initial encounter: Secondary | ICD-10-CM | POA: Diagnosis present

## 2023-04-27 DIAGNOSIS — I509 Heart failure, unspecified: Secondary | ICD-10-CM | POA: Diagnosis not present

## 2023-04-27 DIAGNOSIS — Z7902 Long term (current) use of antithrombotics/antiplatelets: Secondary | ICD-10-CM | POA: Insufficient documentation

## 2023-04-27 DIAGNOSIS — S61209A Unspecified open wound of unspecified finger without damage to nail, initial encounter: Secondary | ICD-10-CM

## 2023-04-27 DIAGNOSIS — S80212A Abrasion, left knee, initial encounter: Secondary | ICD-10-CM | POA: Diagnosis not present

## 2023-04-27 DIAGNOSIS — W19XXXA Unspecified fall, initial encounter: Secondary | ICD-10-CM

## 2023-04-27 LAB — CBC WITH DIFFERENTIAL/PLATELET
Abs Immature Granulocytes: 0.06 10*3/uL (ref 0.00–0.07)
Basophils Absolute: 0 10*3/uL (ref 0.0–0.1)
Basophils Relative: 0 %
Eosinophils Absolute: 0.1 10*3/uL (ref 0.0–0.5)
Eosinophils Relative: 1 %
HCT: 40.9 % (ref 39.0–52.0)
Hemoglobin: 12.8 g/dL — ABNORMAL LOW (ref 13.0–17.0)
Immature Granulocytes: 1 %
Lymphocytes Relative: 7 %
Lymphs Abs: 0.6 10*3/uL — ABNORMAL LOW (ref 0.7–4.0)
MCH: 28.6 pg (ref 26.0–34.0)
MCHC: 31.3 g/dL (ref 30.0–36.0)
MCV: 91.5 fL (ref 80.0–100.0)
Monocytes Absolute: 0.7 10*3/uL (ref 0.1–1.0)
Monocytes Relative: 9 %
Neutro Abs: 7 10*3/uL (ref 1.7–7.7)
Neutrophils Relative %: 82 %
Platelets: 179 10*3/uL (ref 150–400)
RBC: 4.47 MIL/uL (ref 4.22–5.81)
RDW: 14.6 % (ref 11.5–15.5)
WBC: 8.5 10*3/uL (ref 4.0–10.5)
nRBC: 0 % (ref 0.0–0.2)

## 2023-04-27 LAB — BASIC METABOLIC PANEL WITH GFR
Anion gap: 14 (ref 5–15)
BUN: 21 mg/dL (ref 8–23)
CO2: 23 mmol/L (ref 22–32)
Calcium: 9.6 mg/dL (ref 8.9–10.3)
Chloride: 102 mmol/L (ref 98–111)
Creatinine, Ser: 1.19 mg/dL (ref 0.61–1.24)
GFR, Estimated: >60 mL/min (ref >60)
Glucose, Bld: 128 mg/dL — ABNORMAL HIGH (ref 70–99)
Potassium: 3.3 mmol/L — ABNORMAL LOW (ref 3.5–5.1)
Sodium: 139 mmol/L (ref 135–145)

## 2023-04-27 MED ORDER — CEPHALEXIN 500 MG PO CAPS
500.0000 mg | ORAL_CAPSULE | Freq: Four times a day (QID) | ORAL | 0 refills | Status: DC
  Filled 2023-04-27: qty 20, 5d supply, fill #0

## 2023-04-27 MED ORDER — LIDOCAINE HCL (PF) 1 % IJ SOLN
30.0000 mL | Freq: Once | INTRAMUSCULAR | Status: AC
  Administered 2023-04-27: 30 mL via INTRADERMAL
  Filled 2023-04-27: qty 30

## 2023-04-27 MED ORDER — HYDROMORPHONE HCL 1 MG/ML IJ SOLN
0.5000 mg | Freq: Once | INTRAMUSCULAR | Status: AC
  Administered 2023-04-27: 0.5 mg via INTRAVENOUS
  Filled 2023-04-27: qty 1

## 2023-04-27 MED ORDER — TETANUS-DIPHTH-ACELL PERTUSSIS 5-2.5-18.5 LF-MCG/0.5 IM SUSY
0.5000 mL | PREFILLED_SYRINGE | Freq: Once | INTRAMUSCULAR | Status: AC
  Administered 2023-04-27: 0.5 mL via INTRAMUSCULAR
  Filled 2023-04-27: qty 0.5

## 2023-04-27 MED ORDER — CEPHALEXIN 500 MG PO CAPS: 500.0000 mg | ORAL_CAPSULE | Freq: Four times a day (QID) | ORAL | 0 refills | Status: DC

## 2023-04-27 MED ORDER — CEFAZOLIN SODIUM-DEXTROSE 2-4 GM/100ML-% IV SOLN
2.0000 g | INTRAVENOUS | Status: AC
  Administered 2023-04-27: 2 g via INTRAVENOUS
  Filled 2023-04-27: qty 100

## 2023-04-27 MED ORDER — FENTANYL CITRATE PF 50 MCG/ML IJ SOSY
50.0000 ug | PREFILLED_SYRINGE | Freq: Once | INTRAMUSCULAR | Status: AC
  Administered 2023-04-27: 50 ug via INTRAVENOUS
  Filled 2023-04-27: qty 1

## 2023-04-27 MED ORDER — BACITRACIN ZINC 500 UNIT/GM EX OINT
TOPICAL_OINTMENT | Freq: Two times a day (BID) | CUTANEOUS | Status: DC
  Administered 2023-04-27: 1 via TOPICAL
  Filled 2023-04-27: qty 0.9

## 2023-04-27 NOTE — ED Notes (Signed)
 XR at bedside

## 2023-04-27 NOTE — ED Provider Notes (Signed)
.  Reduction of dislocation  Date/Time: 04/27/2023 4:14 PM  Performed by: Merdis Stalling, MD Authorized by: Merdis Stalling, MD  Consent: Verbal consent obtained. Risks and benefits: risks, benefits and alternatives were discussed Consent given by: patient Patient understanding: patient states understanding of the procedure being performed Imaging studies: imaging studies available Patient identity confirmed: verbally with patient Time out: Immediately prior to procedure a "time out" was called to verify the correct patient, procedure, equipment, support staff and site/side marked as required. Local anesthesia used: yes  Anesthesia: Local anesthesia used: yes  Sedation: Patient sedated: no  Patient tolerance: patient tolerated the procedure well with no immediate complications   .Reduction of dislocation  Date/Time: 04/27/2023 4:14 PM  Performed by: Merdis Stalling, MD Authorized by: Merdis Stalling, MD  Consent: Verbal consent obtained. Risks and benefits: risks, benefits and alternatives were discussed Consent given by: patient Patient understanding: patient states understanding of the procedure being performed Imaging studies: imaging studies available Patient identity confirmed: verbally with patient Time out: Immediately prior to procedure a "time out" was called to verify the correct patient, procedure, equipment, support staff and site/side marked as required. Local anesthesia used: yes  Anesthesia: Local anesthesia used: yes  Sedation: Patient sedated: no  Patient tolerance: patient tolerated the procedure well with no immediate complications    Reduction of 4th and 5th finger PIP dislocations    Merdis Stalling, MD 04/27/23 1615

## 2023-04-27 NOTE — ED Provider Notes (Signed)
 [place] [facility] AT [facility] Provider Note   CSN: [id number] Arrival date & time: 04/27/23  1327     History  Chief Complaint  Patient presents with   Hunter [name] is a 73 y.o. male history of chronic ischemic heart disease, diabetes, chronic back pain from spinal stenosis, CHF, wheelchair-bound presented for mechanical fall.  Patient states today he went out to the garden to trim some flowers for his wife and when he was coming back in his electric wheelchair he hit the curb and fell forward with his left hand outstretched and landed on his left knee.  Patient did not hit his head or neck and did not lose consciousness.  Patient states that his pinky finger and ring finger on the left hand are going in different directions but he can still feel his fingers.  Patient seen and a bruise on his left knee.  Patient is unsure of last tetanus.  Home Medications Prior to Admission medications   Medication Sig Start Date End Date Taking? Authorizing Provider  cephALEXin  (KEFLEX ) 500 MG capsule Take 1 capsule (500 mg total) by mouth 4 (four) times daily. 04/27/23  Yes Amiayah Giebel, Arlin Benes, PA-C  albuterol  (PROVENTIL ) (2.5 MG/3ML) 0.083% nebulizer solution USE 1 VIAL IN NEBULIZER EVERY 6 HOURS - and as needed DX: J44.9 Patient taking differently: Take 2.5 mg by nebulization 2 (two) times daily. 01/02/22   Icard, Lucie Ruts, DO  albuterol  (VENTOLIN  HFA) 108 (90 Base) MCG/ACT inhaler Inhale 2 puffs into the lungs every 4-6 hours as needed. 11/12/22     alum hydroxide-mag trisilicate (GAVISCON) 80-20 MG CHEW chewable tablet Chew 2 tablets by mouth 3 (three) times daily as needed for indigestion or heartburn.    [provider]  amLODipine  (NORVASC ) 5 MG tablet Take 1 tablet (5 mg total) by mouth daily. 03/26/23     aspirin  81 MG tablet Take 81 mg by mouth at bedtime.    [provider]  budesonide -formoterol  (SYMBICORT ) 160-4.5 MCG/ACT inhaler Inhale 2  puffs into the lungs in the morning and at bedtime. 02/07/23   Byrum, Robert S, MD  cetirizine (ZYRTEC) 10 MG tablet Take 10 mg by mouth daily as needed for allergies.    [provider]  clopidogrel  (PLAVIX ) 75 MG tablet Take 1 tablet (75 mg total) by mouth daily. 02/28/23   Hugh Madura, MD  Continuous Glucose Sensor (FREESTYLE LIBRE 3 PLUS SENSOR) MISC 1 each every 14 (fourteen) days. 03/26/23     Continuous Glucose Sensor (FREESTYLE LIBRE 3 SENSOR) MISC Change sensor every 14 days to monitor blood glucose continuously. 04/27/22     docusate sodium  (COLACE) 100 MG capsule Take 100 mg by mouth daily.    [provider]  doxazosin  (CARDURA ) 2 MG tablet Take 1 tablet (2 mg total) by mouth daily. 01/30/23   Hugh Madura, MD  fluticasone  (CUTIVATE ) 0.005 % ointment Apply 1 application externally 2 (two) times daily as needed for irritation, not near eyes for 14 days 04/06/22     furosemide  (LASIX ) 40 MG tablet Take 1 tablet (40 mg total) by mouth 2 (two) times daily. Patient taking differently: Take 40 mg by mouth daily. 01/30/23   Hugh Madura, MD  gabapentin  (NEURONTIN ) 300 MG capsule Take 1 capsule (300 mg total) by mouth every morning AND 2 capsules (600 mg total) every evening. 02/27/23     glucose blood (ONETOUCH VERIO) test strip USE TO SELF MONITOR BLOOD  GLUCOSE UP TO 4 TIMES DAILY (E11.65) 04/17/22     insulin  degludec (TRESIBA  FLEXTOUCH) 200 UNIT/ML FlexTouch Pen Inject 66 Units into the skin daily. 10/08/22     insulin  lispro (HUMALOG ) 100 UNIT/ML KwikPen Inject 30 Units into the skin 3 (three) times daily with each  meal. 12/05/22     Insulin  Pen Needle (TECHLITE PEN NEEDLES) 32G X 4 MM MISC Use  3 times daily with Humalog  and 1 time daily with Tresiba  07/09/22     ipratropium (ATROVENT ) 0.06 % nasal spray Place 1-2 sprays into both nostrils 3 (three) times daily as needed for congestion. 09/17/22     ketotifen (ZADITOR) 0.025 % ophthalmic solution Place 2 drops as needed into both  eyes (for allergies).     [provider]  loperamide  (IMODIUM  A-D) 2 MG tablet Take 1 mg by mouth every other day.    [provider]  methocarbamol  (ROBAXIN ) 500 MG tablet Take 1 tablet (500 mg total) by mouth every evening. 07/09/22     metoprolol  tartrate (LOPRESSOR ) 50 MG tablet Take 1 tablet (50 mg total) by mouth 2 (two) times daily. 01/30/23   Hugh Madura, MD  nitroGLYCERIN  (NITROSTAT ) 0.4 MG SL tablet Place 1 tablet (0.4 mg total) under the tongue every 5 (five) minutes as needed for chest pain 03/25/23   Hugh Madura, MD  NOVOFINE 32G X 6 MM MISC USE ONCE DAILY WITH LEVEMIR  PEN 10/29/17   [provider]  omeprazole  (PRILOSEC) 40 MG capsule Take 1 capsule (40 mg total) by mouth 2 (two) times daily. 02/27/23     ONETOUCH VERIO test strip USE TO SELF MONITOR BLOOD GLUCOSE UP TO 4 TIMES DAILY (E11.65) 10/28/17   [provider]  Polyvinyl Alcohol-Povidone (REFRESH OP) Place 1 drop into both eyes daily as needed (dry eyes).    [provider]  potassium chloride  SA (KLOR-CON  M20) 20 MEQ tablet Take 1 tablet (20 mEq total) by mouth daily. 01/30/23   Hugh Madura, MD  Psyllium (METAMUCIL PO) Take 1 Scoop by mouth 2 (two) times daily.    [provider]  Respiratory Therapy Supplies (FLUTTER) DEVI Use as directed 11/14/17   Antonio Baumgarten, NP  rosuvastatin  (CRESTOR ) 20 MG tablet Take 1 tablet (20 mg total) by mouth daily. 01/30/23   Hugh Madura, MD  Semaglutide , 1 MG/DOSE, (OZEMPIC , 1 MG/DOSE,) 4 MG/3ML SOPN Inject 1 mg into the skin once a week. 09/10/22     traMADol  (ULTRAM ) 50 MG tablet Take 1 tablet (50 mg total) by mouth 3 (three) times daily as needed for pain 12/06/22     traMADol  (ULTRAM ) 50 MG tablet Take 1 tablet (50 mg total) by mouth 3 (three) times daily as needed for pain 03/26/23     triamterene -hydrochlorothiazide  (MAXZIDE -25) 37.5-25 MG tablet Take 1 tablet by mouth once a day, in the morning. 01/01/23     Vitamin D ,  Ergocalciferol , (DRISDOL ) 1.25 MG (50000 UNIT) CAPS capsule Take 1 capsule (50,000 Units total) by mouth 2 (two) times a week on Wednesday and Sunday. 12/06/22         Allergies    Chantix  [varenicline ], Codeine, Amitriptyline hcl, Atorvastatin , Glucophage [metformin], Ranolazine , Lisinopril, Ramipril, and Tape    Review of Systems   Review of Systems  Physical Exam Updated Vital Signs BP 132/89 (BP Location: Right Arm)   Pulse 80   Temp 97.8 F (36.6 C) (Oral)   Resp 16   Ht 5\' 6"  (1.676 m)  Wt 108.9 kg   SpO2 100%   BMI 38.74 kg/m  Physical Exam Constitutional:      General: He is not in acute distress. HENT:     Head: Normocephalic and atraumatic.  Eyes:     Extraocular Movements: Extraocular movements intact.     Conjunctiva/sclera: Conjunctivae normal.     Pupils: Pupils are equal, round, and reactive to light.  Neck:     Comments: No midline tenderness or bony abnormalities Cardiovascular:     Rate and Rhythm: Normal rate and regular rhythm.     Pulses: Normal pulses.     Comments: 2+ bilateral radial pulses Pulmonary:     Effort: Pulmonary effort is normal. No respiratory distress.     Breath sounds: Normal breath sounds.  Abdominal:     Palpations: Abdomen is soft.     Tenderness: There is no abdominal tenderness. There is no guarding or rebound.     Comments: No signs of trauma  Musculoskeletal:     Cervical back: Normal range of motion. No tenderness.     Comments: Left hand ring finger and pinky finger: Open dislocation at the PIP joint bilaterally however capillary refill is less than 2 seconds and patient can still feel distally Compartment soft Pain not out of proportion Left knee has no bony tenderness or open deformities  Skin:    General: Skin is warm and dry.     Capillary Refill: Capillary refill takes less than 2 seconds.     Comments: Abrasion noted to left knee  Neurological:     Mental Status: He is alert.     Comments: Sensation intact  distally  Psychiatric:        Mood and Affect: Mood normal.     ED Results / Procedures / Treatments   Labs (all labs ordered are listed, but only abnormal results are displayed) Labs Reviewed  BASIC METABOLIC PANEL WITH GFR - Abnormal; Notable for the following components:      Result Value   Potassium 3.3 (*)    Glucose, Bld 128 (*)    All other components within normal limits  CBC WITH DIFFERENTIAL/PLATELET - Abnormal; Notable for the following components:   Hemoglobin 12.8 (*)    Lymphs Abs 0.6 (*)    All other components within normal limits    EKG None  Radiology DG Hand 2 View Left Result Date: 04/27/2023 CLINICAL DATA:  Post reduction EXAM: LEFT HAND - 2 VIEW COMPARISON:  Earlier today at 2:02 p.m. FINDINGS: 4:25 p.m. AP and lateral views. Overlap of fingers on the lateral view. Given this limitation, relocation of the interphalangeal joints of the fourth and fifth digits, without complicating fracture. Detail obscured by overlying splint material. IMPRESSION: Interval relocation of the proximal interphalangeal joints of the fourth and fifth digits. Electronically Signed   By: Lore Rode M.D.   On: 04/27/2023 17:53   DG Hand Complete Left Result Date: 04/27/2023 CLINICAL DATA:  Open fracture base of fourth and fifth fingers. Wheelchair-bound. Slid out of wheelchair. EXAM: LEFT HAND - COMPLETE 3+ VIEW COMPARISON:  None Available. FINDINGS: There is dorsal dislocation of the middle phalanx with respect of the proximal phalanx of the fifth finger, with a middle phalanx proximally retracted approximately 9 mm and extending dorsally, nearly orthogonal to the proximal phalanx. There is dorsal dislocation of the middle phalanx with respect to the proximal phalanx of the fourth finger, with approximately 4 mm proximal retraction of a middle phalanx which extends dorsally, new  orthogonal to the proximal phalanx. There is also approximately 4 mm lateral/radial displacement of the middle  phalanx, and approximately 38 degrees medial apex angulation of the joint on frontal view. No definitive acute fracture is seen. Mild third DIP joint space narrowing and peripheral osteophytosis with a small chronic ossicle dorsally. Mild-to-moderate thumb metacarpophalangeal joint space narrowing and peripheral osteophytosis with a small ossicle laterally. Mild-to-moderate thumb carpometacarpal joint space narrowing, subchondral sclerosis, and peripheral osteophytosis. IMPRESSION: 1. Dorsal dislocation of the middle phalanges of the fourth and fifth fingers with respect to the proximal phalanges. 2. No definitive acute fracture is seen. 3. 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 Electronically Signed   By: Bertina Broccoli M.D.   On: 04/27/2023 15:41   DG Knee 2 Views Left Result Date: 04/27/2023 CLINICAL DATA:  Recent fall from wheelchair, knee pain EXAM: LEFT KNEE - 1-2 VIEW COMPARISON:  None Available. FINDINGS: Normal alignment without acute osseous finding, fracture, or large effusion. Relatively preserved joint spaces. No significant arthropathy. Peripheral femoropopliteal vascular stents noted. Mild extremity subcutaneous edema diffusely. IMPRESSION: 1. No acute osseous finding by plain radiography. 2. Mild extremity subcutaneous edema. Electronically Signed   By: Melven Stable.  Shick M.D.   On: 04/27/2023 15:15   CT Angio Aortobifemoral W and/or Wo Contrast Result Date: 04/25/2023 CLINICAL DATA:  Right groin pain.  Shortness of breath. EXAM: CT ANGIOGRAPHY OF ABDOMINAL AORTA WITH ILIOFEMORAL RUNOFF TECHNIQUE: Multidetector CT imaging of the abdomen, pelvis and lower extremities was performed using the standard protocol during bolus administration of intravenous contrast. Multiplanar CT image reconstructions and MIPs were obtained to evaluate the vascular anatomy. RADIATION DOSE REDUCTION: This exam was performed according to the departmental dose-optimization program which  includes automated exposure control, adjustment of the mA and/or kV according to patient size and/or use of iterative reconstruction technique. CONTRAST:  OMNIPAQUE  IOHEXOL  350 MG/ML SOLN COMPARISON:  None Available. FINDINGS: VASCULAR Aorta: Aorta is small in caliber, the infrarenal abdominal aorta measures 11 mm. There is moderate calcified atherosclerotic disease. There is no significant stenosis, aneurysm identified. Celiac: Patent without evidence of aneurysm, dissection, vasculitis or significant stenosis. There is calcified atherosclerotic disease at the origin likely causing mild stenosis. SMA: Patent without evidence of aneurysm, dissection, vasculitis or significant stenosis. There is calcified atherosclerotic disease at the origin causing mild stenosis. Renals: There is severe calcified atherosclerotic disease at the origins of the bilateral renal artery likely causing moderate stenosis. The arteries are otherwise patent without evidence for dissection, thrombosis or aneurysm. IMA: Not well opacified on this study due to small caliber. Calcified atherosclerotic disease present. RIGHT Lower Extremity Inflow: Small caliber common and external iliac arteries are present bilaterally. Severe atherosclerotic disease present. There are areas of moderate severe stenosis in the right common femoral artery. Outflow: Small caliber. Severe calcified atherosclerotic disease present. There are multiple areas of focal severe stenosis/occlusion throughout the mid and proximal right superficial femoral artery. No flow is identified in the distal superficial femoral artery or popliteal artery. Runoff: Patent three vessel runoff to the ankle. LEFT Lower Extremity Inflow: Left external iliac artery stent patent. Vessels are small in caliber and there is diffuse calcified atherosclerotic disease. No evidence for dissection or thrombosis. Vessels are small in caliber. Outflow: Left common femoral artery small in caliber  with atherosclerotic disease, but grossly patent. Left superficial femoral artery stent is present and appears patent. Popliteal artery patent. Runoff: Patent three vessel runoff to the ankle. Veins: No obvious venous abnormality within the limitations of this arterial  phase study. Review of the MIP images confirms the above findings. NON-VASCULAR Lower chest: No acute abnormality. Hepatobiliary: No focal liver abnormality is seen. Status post cholecystectomy. No biliary dilatation. Pancreas: Unremarkable. No pancreatic ductal dilatation or surrounding inflammatory changes. Spleen: Normal in size without focal abnormality. Adrenals/Urinary Tract: Adrenal glands are unremarkable. Kidneys are normal, without renal calculi, focal lesion, or hydronephrosis. Bladder is unremarkable. Stomach/Bowel: Stomach is within normal limits. Appendix appears normal. No evidence of bowel wall thickening, distention, or inflammatory changes. Lymphatic: No enlarged lymph nodes are seen. Reproductive: Prostate gland is within normal limits. Penile pump is present. Other: There is a small fat containing left inguinal hernia. There is diastasis and bulging of the anterior abdominal wall. There is no ascites. Musculoskeletal: There is diffuse subcutaneous edema and swelling of the left lower extremity from the level of the knee to the level of the ankle. No fluid collection or soft tissue gas identified. There is fatty atrophy of musculature in the left lower extremity. There is no acute osseous abnormality. There are cerclage wires in the proximal femur with callus formation and healed fracture. Disc spacer seen at L5-S1. IMPRESSION: VASCULAR a 1. Severe atherosclerotic disease of the aorta, iliac arteries, and lower extremities. Small caliber aorta, pelvic vessels and lower extremity vessels diffusely. 2. Multiple areas of severe stenosis/occlusion throughout the right superficial femoral artery. No flow identified in the distal right  superficial femoral artery or popliteal artery. 3. Patent left external iliac artery and superficial femoral artery stents. 4. Three-vessel runoff to the ankles bilaterally. NON-VASCULAR 1. Diffuse subcutaneous edema and swelling of the left lower extremity from the level of the knee to the level of the ankle. No fluid collection or soft tissue gas. 2. No acute localizing process in the abdomen or pelvis. Electronically Signed   By: Tyron Gallon M.D.   On: 04/25/2023 23:33   DG Hip Unilat W or Wo Pelvis 2-3 Views Right Result Date: 04/25/2023 CLINICAL DATA:  Bilateral groin pain EXAM: DG HIP (WITH OR WITHOUT PELVIS) 2-3V RIGHT COMPARISON:  None Available. FINDINGS: Vascular stent in the left pelvis and left thigh. Hardware at the lumbosacral region. Penile prosthesis. Pubic symphysis appears intact. Partially visualized femoral cerclage wires. Old appearing fracture deformity proximal shaft of the right femur and evidence of old hardware tract. No definite acute displaced fracture or malalignment. Mild bilateral hip arthritis. Extensive vascular calcifications IMPRESSION: No definite acute osseous abnormality. Old appearing fracture deformity proximal shaft of the right femur with evidence of old hardware tract. Mild bilateral hip arthritis. Electronically Signed   By: Esmeralda Hedge M.D.   On: 04/25/2023 21:37   DG Chest 1 View Result Date: 04/25/2023 CLINICAL DATA:  Shortness of breath EXAM: CHEST  1 VIEW COMPARISON:  11/14/2017 FINDINGS: Sternotomy. Mild cardiomegaly. Possible small right effusion. Difficult to exclude atelectasis or airspace disease at left base. No pneumothorax IMPRESSION: Mild cardiomegaly. Possible small right effusion. Difficult to exclude atelectasis or airspace disease at left base. Electronically Signed   By: Esmeralda Hedge M.D.   On: 04/25/2023 21:35    Procedures .Reduction of dislocation  Date/Time: 04/27/2023 4:07 PM  Performed by: Denese Finn, PA-C Authorized by:  Denese Finn, PA-C  Consent: Verbal consent obtained. Risks and benefits: risks, benefits and alternatives were discussed Consent given by: patient Patient understanding: patient states understanding of the procedure being performed Imaging studies: imaging studies available Patient identity confirmed: verbally with patient Time out: Immediately prior to procedure a "time out" was called  to verify the correct patient, procedure, equipment, support staff and site/side marked as required. Local anesthesia used: yes Anesthesia: digital block  Anesthesia: Local anesthesia used: yes Local Anesthetic: lidocaine  1% without epinephrine  Anesthetic total: 10 mL  Sedation: Patient sedated: no  Patient tolerance: patient tolerated the procedure well with no immediate complications       Medications Ordered in ED Medications  bacitracin  ointment (1 Application Topical Given 04/27/23 1356)  ceFAZolin  (ANCEF ) IVPB 2g/100 mL premix (0 g Intravenous Stopped 04/27/23 1510)  Tdap (BOOSTRIX) injection 0.5 mL (0.5 mLs Intramuscular Given 04/27/23 1355)  fentaNYL  (SUBLIMAZE ) injection 50 mcg (50 mcg Intravenous Given 04/27/23 1440)  lidocaine  (PF) (XYLOCAINE ) 1 % injection 30 mL (30 mLs Intradermal Given by Other 04/27/23 1439)  HYDROmorphone  (DILAUDID ) injection 0.5 mg (0.5 mg Intravenous Given 04/27/23 1549)    ED Course/ Medical Decision Making/ A&P                                 Medical Decision Making Amount and/or Complexity of Data Reviewed Labs: ordered. Radiology: ordered.  Risk OTC drugs. Prescription drug management.   Cammy Center 73 y.o. presented today for fall. Working DDx that I considered at this time includes, but not limited to, vasovagal episode, mechanical fall, ICH, epidural/subdural hematoma, basilar skull fracture, anemia, electrolyte abnormalities, drug-induced, arrhythmia, UTI, fracture, contusion, soft tissue injury, dislocation.  R/o DDx: vasovagal  episode, ICH, epidural/subdural hematoma, basilar skull fracture, anemia, electrolyte abnormalities, drug-induced, arrhythmia, UTI, fracture, contusion: These are considered less likely due to history of present illness, physical exam, lab/imaging findings  Review of prior external notes: 03/03/2023 continuity of care  Unique Tests and My Independent Interpretation:  Left hand x-ray: Dislocation of bilateral PIP 4th and 5th digit; repeat x-ray shows successful reduction Left knee x-ray: No acute bony pathology CBC: Unremarkable BMP: Unremarkable  Social Determinants of Health: none  Discussion with Independent Historian: None  Discussion of Management of Tests: None  Risk: Medium: prescription drug management  Risk Stratification Score: none  Staffed with Drury Geralds, MD  Plan: On exam patient was no acute distress with stable vitals. Physical exam showed open dislocation at the 4th and 5th PIP joint in the left hand however capillary refill still less than 2 seconds the patient has good pulses and sensation distally.  Will get imaging.  Will give patient dose of Ancef .  She does have open dislocation update tetanus as he is unsure of his last tetanus shot.  Will get imaging and basic labs.  Patient has chronic back pain but states that his back pain is at baseline and has no new changes and does not have any midline tenderness in the neck region with me in his denying hitting his head or passing out and so we will forego advanced imaging the head or neck region at this time as it appears he had a FOOSH injury.  X-ray does show by my independent interpretation dislocations of the 4th and 5th PIP joints of the left hand.  I spoke with the hand specialists and we will reduce these dislocations and have the patient follow-up in the office along with antibiotics and pain meds.  At this time there is no recommendation to repair the open dislocations.  We were able to anesthetize both fingers with a  digital block and successfully reduce both dislocations with capillary refill and sensation intact distally.  Patient is able to range fingers minimally afterwards  which was an improvement.  Vaseline and nonadherent dressing replaced along with Coban to wrap.  Did recommend that patient follows up with hand closely and we will give a few days with antibiotics.  Patient does have tramadol  prescription at his pharmacy with that I made him aware of that he can take for his pain.  We discussed return precautions.  Will discharge pending repeat hand x-ray.  At time of discharge pain is controlled and compartments are soft and patient is neuro vastly intact.  Repeat x-ray shows successful reduction.  Will discharge with outpatient follow-up.  Patient was given return precautions. Patient stable for discharge at this time.  Patient verbalized understanding of plan.  This chart was dictated using voice recognition software.  Despite best efforts to proofread,  errors can occur which can change the documentation meaning.        Final Clinical Impression(s) / ED Diagnoses Final diagnoses:  Open finger dislocation, initial encounter  Fall, initial encounter    Rx / DC Orders ED Discharge Orders          Ordered    cephALEXin  (KEFLEX ) 500 MG capsule  4 times daily        04/27/23 1642              Denese Finn, PA-C 04/27/23 1801    Merdis Stalling, MD 04/27/23 [phone]

## 2023-04-27 NOTE — ED Triage Notes (Addendum)
 Patient arrives via [facility] for fall out of wheelchair with left arm injury. Patient hit grassy curb of driveway, wheelchair stopped and patient slid out of wheelchair. Did not head head, no LOC. Up with fire. Abrasions to left knee and left elbow. GCS 15. Fracture to pinky and ring finger. Tenderness to spine-previous surgery. Patient is on plavix  and aspirin . Sensation intact.   178/68 HR 78 95 on room air CBG 155

## 2023-04-27 NOTE — Discharge Instructions (Addendum)
 Please follow-up with a hand specialist in regards recent symptoms and ER visit.  Today you appear to have an open dislocation of your 2 fingers in which we are able to reduce these.  Please keep these wrapped with Coban and follow-up with the hand specialist.  It appears that you have tramadol  prescription sent to your pharmacy from previous visit so I recommend you take these up along with antibiotics and follow-up with a hand specialist.  If you begin to have redness in the area, pus, severe pain, decree sensation, fevers or other changes or worsening of symptoms please return to ER.

## 2023-04-28 ENCOUNTER — Other Ambulatory Visit: Payer: Self-pay

## 2023-04-29 ENCOUNTER — Other Ambulatory Visit (HOSPITAL_COMMUNITY): Payer: Self-pay

## 2023-04-29 DIAGNOSIS — S63257A Unspecified dislocation of left little finger, initial encounter: Secondary | ICD-10-CM | POA: Insufficient documentation

## 2023-04-29 DIAGNOSIS — S61205A Unspecified open wound of left ring finger without damage to nail, initial encounter: Secondary | ICD-10-CM | POA: Insufficient documentation

## 2023-04-30 ENCOUNTER — Other Ambulatory Visit (HOSPITAL_COMMUNITY): Payer: Self-pay

## 2023-05-03 ENCOUNTER — Other Ambulatory Visit: Payer: Self-pay

## 2023-05-03 ENCOUNTER — Other Ambulatory Visit (HOSPITAL_COMMUNITY): Payer: Self-pay

## 2023-05-03 ENCOUNTER — Other Ambulatory Visit: Payer: Self-pay | Admitting: Cardiology

## 2023-05-03 MED ORDER — CLOPIDOGREL BISULFATE 75 MG PO TABS
75.0000 mg | ORAL_TABLET | Freq: Every day | ORAL | 3 refills | Status: AC
  Filled 2023-05-03: qty 90, 90d supply, fill #0
  Filled 2023-07-23: qty 90, 90d supply, fill #1
  Filled 2023-10-31: qty 90, 90d supply, fill #2
  Filled 2024-02-04: qty 90, 90d supply, fill #3

## 2023-05-06 ENCOUNTER — Other Ambulatory Visit (HOSPITAL_COMMUNITY): Payer: Self-pay

## 2023-05-07 ENCOUNTER — Other Ambulatory Visit: Payer: Self-pay

## 2023-05-07 ENCOUNTER — Other Ambulatory Visit (HOSPITAL_COMMUNITY): Payer: Self-pay

## 2023-05-08 ENCOUNTER — Other Ambulatory Visit (HOSPITAL_COMMUNITY): Payer: Self-pay

## 2023-05-08 MED ORDER — METHOCARBAMOL 500 MG PO TABS
500.0000 mg | ORAL_TABLET | Freq: Every evening | ORAL | 3 refills | Status: AC
  Filled 2023-07-23: qty 90, 90d supply, fill #0
  Filled 2023-10-15: qty 90, 90d supply, fill #1
  Filled 2024-02-04: qty 90, 90d supply, fill #2

## 2023-05-09 ENCOUNTER — Other Ambulatory Visit (HOSPITAL_COMMUNITY): Payer: Self-pay

## 2023-05-09 MED ORDER — CEPHALEXIN 500 MG PO CAPS
500.0000 mg | ORAL_CAPSULE | Freq: Four times a day (QID) | ORAL | 0 refills | Status: DC
  Filled 2023-05-09: qty 40, 10d supply, fill #0

## 2023-05-09 MED ORDER — CEPHALEXIN 500 MG PO CAPS
500.0000 mg | ORAL_CAPSULE | Freq: Three times a day (TID) | ORAL | 0 refills | Status: DC
  Filled 2023-05-09: qty 30, 10d supply, fill #0

## 2023-05-28 ENCOUNTER — Ambulatory Visit: Payer: Medicare Other | Admitting: Podiatry

## 2023-05-28 ENCOUNTER — Encounter: Payer: Self-pay | Admitting: Podiatry

## 2023-05-28 DIAGNOSIS — M79676 Pain in unspecified toe(s): Secondary | ICD-10-CM

## 2023-05-28 DIAGNOSIS — H9192 Unspecified hearing loss, left ear: Secondary | ICD-10-CM | POA: Insufficient documentation

## 2023-05-28 DIAGNOSIS — B351 Tinea unguium: Secondary | ICD-10-CM | POA: Diagnosis not present

## 2023-05-28 DIAGNOSIS — G8929 Other chronic pain: Secondary | ICD-10-CM | POA: Insufficient documentation

## 2023-05-28 DIAGNOSIS — E1151 Type 2 diabetes mellitus with diabetic peripheral angiopathy without gangrene: Secondary | ICD-10-CM

## 2023-05-28 DIAGNOSIS — D689 Coagulation defect, unspecified: Secondary | ICD-10-CM | POA: Diagnosis not present

## 2023-05-28 DIAGNOSIS — B182 Chronic viral hepatitis C: Secondary | ICD-10-CM | POA: Insufficient documentation

## 2023-05-28 NOTE — Progress Notes (Signed)
 He presents today chief complaint of painful elongated toenails.  Denies any fever chills nausea vomit muscle aches pains.  Objective: Pulses are nonpalpable capillary fill time is immediate toenails are long thick yellow dystrophic onychomycotic no open lesions or wounds are noted.  Assessment: Pain limb secondary to onychomycosis.  Plan: Debridement of toenails 1 through 5 bilateral.

## 2023-05-30 ENCOUNTER — Other Ambulatory Visit: Payer: Self-pay

## 2023-05-30 ENCOUNTER — Other Ambulatory Visit (HOSPITAL_COMMUNITY): Payer: Self-pay

## 2023-06-03 ENCOUNTER — Other Ambulatory Visit (HOSPITAL_COMMUNITY): Payer: Self-pay

## 2023-06-06 ENCOUNTER — Other Ambulatory Visit (HOSPITAL_COMMUNITY): Payer: Self-pay

## 2023-06-06 MED ORDER — CIPROFLOXACIN HCL 500 MG PO TABS
500.0000 mg | ORAL_TABLET | Freq: Two times a day (BID) | ORAL | 0 refills | Status: DC
  Filled 2023-06-06: qty 10, 5d supply, fill #0

## 2023-06-25 ENCOUNTER — Other Ambulatory Visit: Payer: Self-pay

## 2023-07-23 ENCOUNTER — Other Ambulatory Visit: Payer: Self-pay | Admitting: Cardiology

## 2023-07-23 ENCOUNTER — Other Ambulatory Visit (HOSPITAL_COMMUNITY): Payer: Self-pay

## 2023-07-23 ENCOUNTER — Other Ambulatory Visit: Payer: Self-pay

## 2023-07-24 ENCOUNTER — Other Ambulatory Visit (HOSPITAL_COMMUNITY): Payer: Self-pay

## 2023-07-24 MED ORDER — FUROSEMIDE 40 MG PO TABS
40.0000 mg | ORAL_TABLET | Freq: Two times a day (BID) | ORAL | 2 refills | Status: AC
  Filled 2023-07-24: qty 180, 90d supply, fill #0
  Filled 2023-10-31: qty 180, 90d supply, fill #1
  Filled 2024-02-04: qty 180, 90d supply, fill #2

## 2023-07-24 MED ORDER — ROSUVASTATIN CALCIUM 20 MG PO TABS
20.0000 mg | ORAL_TABLET | Freq: Every day | ORAL | 2 refills | Status: AC
  Filled 2023-07-24: qty 90, 90d supply, fill #0
  Filled 2023-10-31: qty 90, 90d supply, fill #1
  Filled 2024-02-04: qty 90, 90d supply, fill #2

## 2023-07-24 MED ORDER — POTASSIUM CHLORIDE CRYS ER 20 MEQ PO TBCR
20.0000 meq | EXTENDED_RELEASE_TABLET | Freq: Every day | ORAL | 2 refills | Status: AC
  Filled 2023-07-24: qty 90, 90d supply, fill #0
  Filled 2023-10-31: qty 90, 90d supply, fill #1
  Filled 2024-02-04: qty 90, 90d supply, fill #2

## 2023-07-24 MED ORDER — METOPROLOL TARTRATE 50 MG PO TABS
50.0000 mg | ORAL_TABLET | Freq: Two times a day (BID) | ORAL | 2 refills | Status: AC
  Filled 2023-07-24: qty 180, 90d supply, fill #0
  Filled 2023-10-24: qty 180, 90d supply, fill #1
  Filled 2024-02-04: qty 180, 90d supply, fill #2

## 2023-07-24 MED ORDER — DOXAZOSIN MESYLATE 2 MG PO TABS
2.0000 mg | ORAL_TABLET | Freq: Every day | ORAL | 2 refills | Status: AC
  Filled 2023-07-24: qty 90, 90d supply, fill #0
  Filled 2023-10-31: qty 90, 90d supply, fill #1
  Filled 2024-02-04: qty 90, 90d supply, fill #2

## 2023-07-30 ENCOUNTER — Other Ambulatory Visit (HOSPITAL_COMMUNITY): Payer: Self-pay

## 2023-07-30 MED ORDER — FREESTYLE LIBRE 3 PLUS SENSOR MISC
11 refills | Status: AC
  Filled 2023-07-30 – 2023-08-15 (×2): qty 2, 30d supply, fill #0
  Filled 2023-09-18: qty 6, 90d supply, fill #1
  Filled 2023-12-18: qty 6, 90d supply, fill #2

## 2023-08-15 ENCOUNTER — Other Ambulatory Visit: Payer: Self-pay

## 2023-08-15 ENCOUNTER — Other Ambulatory Visit (HOSPITAL_COMMUNITY): Payer: Self-pay

## 2023-08-15 MED ORDER — FREESTYLE LIBRE 3 PLUS SENSOR MISC
3 refills | Status: AC
  Filled 2023-08-15: qty 7, 114d supply, fill #0

## 2023-08-29 ENCOUNTER — Encounter: Payer: Self-pay | Admitting: Podiatry

## 2023-08-29 ENCOUNTER — Other Ambulatory Visit (HOSPITAL_COMMUNITY): Payer: Self-pay

## 2023-08-29 ENCOUNTER — Ambulatory Visit (INDEPENDENT_AMBULATORY_CARE_PROVIDER_SITE_OTHER): Admitting: Podiatry

## 2023-08-29 DIAGNOSIS — B351 Tinea unguium: Secondary | ICD-10-CM

## 2023-08-29 DIAGNOSIS — M79676 Pain in unspecified toe(s): Secondary | ICD-10-CM

## 2023-08-29 DIAGNOSIS — L02612 Cutaneous abscess of left foot: Secondary | ICD-10-CM

## 2023-08-29 DIAGNOSIS — E1151 Type 2 diabetes mellitus with diabetic peripheral angiopathy without gangrene: Secondary | ICD-10-CM

## 2023-08-29 DIAGNOSIS — D689 Coagulation defect, unspecified: Secondary | ICD-10-CM

## 2023-08-29 DIAGNOSIS — L03032 Cellulitis of left toe: Secondary | ICD-10-CM

## 2023-08-29 MED ORDER — CEPHALEXIN 500 MG PO CAPS
500.0000 mg | ORAL_CAPSULE | Freq: Three times a day (TID) | ORAL | 0 refills | Status: DC
  Filled 2023-08-29: qty 30, 10d supply, fill #0

## 2023-08-29 NOTE — Progress Notes (Signed)
 She presents today wheelchair-bound as usual for a chief complaint of painful thick elongated toenails.  He is also concerned about an area on his toes that was open has now healed up but is red.  Objective: Vital signs are stable alert oriented x 3.  There is mild erythema around that second digit dorsal PIPJ area no purulence no malodor no signs of infection other than cellulitic process.  Toenails are thick yellow dystrophic likely mycotic painful palpation as well as debridement.  Assessment: Pain limb secondary to onychomycosis and cellulitis second toe.  Plan: Will start him on a 10-day course of Keflex  500 mg 1 p.o. 3 times daily and then debride nails 1 through 5 bilaterally follow-up with him in a week if not improved if improved in 3 months.

## 2023-09-04 ENCOUNTER — Other Ambulatory Visit (HOSPITAL_COMMUNITY): Payer: Self-pay

## 2023-09-16 ENCOUNTER — Other Ambulatory Visit (HOSPITAL_COMMUNITY): Payer: Self-pay

## 2023-09-16 ENCOUNTER — Other Ambulatory Visit (HOSPITAL_BASED_OUTPATIENT_CLINIC_OR_DEPARTMENT_OTHER): Payer: Self-pay

## 2023-09-16 MED ORDER — PAXLOVID (300/100) 20 X 150 MG & 10 X 100MG PO TBPK
3.0000 | ORAL_TABLET | Freq: Two times a day (BID) | ORAL | 0 refills | Status: DC
  Filled 2023-09-16: qty 30, 5d supply, fill #0

## 2023-09-18 ENCOUNTER — Other Ambulatory Visit (HOSPITAL_COMMUNITY): Payer: Self-pay

## 2023-09-18 ENCOUNTER — Other Ambulatory Visit: Payer: Self-pay

## 2023-09-19 ENCOUNTER — Other Ambulatory Visit (HOSPITAL_COMMUNITY): Payer: Self-pay

## 2023-09-19 MED ORDER — GABAPENTIN 300 MG PO CAPS
ORAL_CAPSULE | ORAL | 1 refills | Status: AC
  Filled 2023-09-19 – 2023-09-25 (×2): qty 270, 90d supply, fill #0
  Filled 2023-12-18: qty 270, 90d supply, fill #1

## 2023-09-20 ENCOUNTER — Other Ambulatory Visit (HOSPITAL_COMMUNITY): Payer: Self-pay

## 2023-09-23 ENCOUNTER — Other Ambulatory Visit (HOSPITAL_COMMUNITY): Payer: Self-pay

## 2023-09-24 ENCOUNTER — Other Ambulatory Visit: Payer: Self-pay

## 2023-09-24 ENCOUNTER — Other Ambulatory Visit (HOSPITAL_COMMUNITY): Payer: Self-pay

## 2023-09-24 MED ORDER — TRAMADOL HCL 50 MG PO TABS
50.0000 mg | ORAL_TABLET | Freq: Three times a day (TID) | ORAL | 2 refills | Status: DC | PRN
  Filled 2023-09-24: qty 90, 30d supply, fill #0
  Filled 2023-10-15 – 2023-10-23 (×2): qty 90, 30d supply, fill #1
  Filled 2023-11-20: qty 90, 30d supply, fill #2

## 2023-09-24 MED ORDER — OZEMPIC (1 MG/DOSE) 4 MG/3ML ~~LOC~~ SOPN
1.0000 mg | PEN_INJECTOR | SUBCUTANEOUS | 5 refills | Status: AC
  Filled 2023-09-24 – 2023-09-25 (×2): qty 3, 28d supply, fill #0
  Filled 2023-10-15 – 2023-10-21 (×2): qty 3, 28d supply, fill #1
  Filled 2023-11-20: qty 3, 28d supply, fill #2
  Filled 2024-01-07: qty 3, 28d supply, fill #3
  Filled 2024-02-04: qty 3, 28d supply, fill #4

## 2023-09-25 ENCOUNTER — Other Ambulatory Visit (HOSPITAL_COMMUNITY): Payer: Self-pay

## 2023-09-27 ENCOUNTER — Other Ambulatory Visit (HOSPITAL_COMMUNITY): Payer: Self-pay

## 2023-10-14 ENCOUNTER — Ambulatory Visit: Admitting: Physician Assistant

## 2023-10-14 ENCOUNTER — Ambulatory Visit (HOSPITAL_COMMUNITY)
Admission: RE | Admit: 2023-10-14 | Discharge: 2023-10-14 | Disposition: A | Discharge status: Home / Self Care | Source: Ambulatory Visit | Attending: Physician Assistant | Admitting: Physician Assistant

## 2023-10-14 ENCOUNTER — Other Ambulatory Visit (HOSPITAL_COMMUNITY): Payer: Self-pay

## 2023-10-14 ENCOUNTER — Ambulatory Visit (HOSPITAL_BASED_OUTPATIENT_CLINIC_OR_DEPARTMENT_OTHER)
Admission: RE | Admit: 2023-10-14 | Discharge: 2023-10-14 | Disposition: A | Source: Ambulatory Visit | Attending: Physician Assistant | Admitting: Physician Assistant

## 2023-10-14 ENCOUNTER — Ambulatory Visit (HOSPITAL_BASED_OUTPATIENT_CLINIC_OR_DEPARTMENT_OTHER)
Admission: RE | Admit: 2023-10-14 | Discharge: 2023-10-14 | Disposition: A | Discharge status: Home / Self Care | Source: Ambulatory Visit | Attending: Physician Assistant | Admitting: Physician Assistant

## 2023-10-14 VITALS — BP 156/68 | HR 60 | Temp 97.7°F | Wt 244.4 lb

## 2023-10-14 DIAGNOSIS — I779 Disorder of arteries and arterioles, unspecified: Secondary | ICD-10-CM | POA: Diagnosis not present

## 2023-10-14 DIAGNOSIS — I739 Peripheral vascular disease, unspecified: Secondary | ICD-10-CM

## 2023-10-14 DIAGNOSIS — I6523 Occlusion and stenosis of bilateral carotid arteries: Secondary | ICD-10-CM | POA: Diagnosis present

## 2023-10-14 DIAGNOSIS — S91109D Unspecified open wound of unspecified toe(s) without damage to nail, subsequent encounter: Secondary | ICD-10-CM | POA: Insufficient documentation

## 2023-10-14 LAB — VAS US ABI WITH/WO TBI

## 2023-10-14 MED ORDER — FLUZONE HIGH-DOSE 0.5 ML IM SUSY
0.5000 mL | PREFILLED_SYRINGE | Freq: Once | INTRAMUSCULAR | 0 refills | Status: AC
  Filled 2023-10-14: qty 0.5, 1d supply, fill #0

## 2023-10-14 MED ORDER — DOXYCYCLINE HYCLATE 100 MG PO CAPS
100.0000 mg | ORAL_CAPSULE | Freq: Two times a day (BID) | ORAL | 0 refills | Status: DC
  Filled 2023-10-14: qty 28, 14d supply, fill #0
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
## 2023-10-15 ENCOUNTER — Other Ambulatory Visit: Payer: Self-pay

## 2023-10-15 ENCOUNTER — Other Ambulatory Visit (HOSPITAL_COMMUNITY): Payer: Self-pay

## 2023-10-15 DIAGNOSIS — I779 Disorder of arteries and arterioles, unspecified: Secondary | ICD-10-CM

## 2023-10-22 ENCOUNTER — Other Ambulatory Visit: Payer: Self-pay

## 2023-10-22 ENCOUNTER — Ambulatory Visit (HOSPITAL_COMMUNITY)
Admission: RE | Admit: 2023-10-22 | Discharge: 2023-10-22 | Disposition: A | Discharge status: Home / Self Care | Attending: Surgery | Admitting: Surgery

## 2023-10-22 ENCOUNTER — Encounter (HOSPITAL_COMMUNITY)
Admission: RE | Disposition: A | Discharge status: Home / Self Care | Payer: Self-pay | Source: Home / Self Care | Attending: Surgery

## 2023-10-22 DIAGNOSIS — Z87891 Personal history of nicotine dependence: Secondary | ICD-10-CM | POA: Insufficient documentation

## 2023-10-22 DIAGNOSIS — Z7985 Long-term (current) use of injectable non-insulin antidiabetic drugs: Secondary | ICD-10-CM | POA: Diagnosis not present

## 2023-10-22 DIAGNOSIS — I6523 Occlusion and stenosis of bilateral carotid arteries: Secondary | ICD-10-CM | POA: Insufficient documentation

## 2023-10-22 DIAGNOSIS — I70245 Atherosclerosis of native arteries of left leg with ulceration of other part of foot: Secondary | ICD-10-CM | POA: Insufficient documentation

## 2023-10-22 DIAGNOSIS — Z794 Long term (current) use of insulin: Secondary | ICD-10-CM | POA: Diagnosis not present

## 2023-10-22 DIAGNOSIS — I70248 Atherosclerosis of native arteries of left leg with ulceration of other part of lower left leg: Secondary | ICD-10-CM | POA: Diagnosis not present

## 2023-10-22 DIAGNOSIS — E1151 Type 2 diabetes mellitus with diabetic peripheral angiopathy without gangrene: Secondary | ICD-10-CM | POA: Diagnosis present

## 2023-10-22 DIAGNOSIS — L97529 Non-pressure chronic ulcer of other part of left foot with unspecified severity: Secondary | ICD-10-CM | POA: Diagnosis not present

## 2023-10-22 DIAGNOSIS — Z7902 Long term (current) use of antithrombotics/antiplatelets: Secondary | ICD-10-CM | POA: Diagnosis not present

## 2023-10-22 DIAGNOSIS — Z7982 Long term (current) use of aspirin: Secondary | ICD-10-CM | POA: Insufficient documentation

## 2023-10-22 DIAGNOSIS — E11621 Type 2 diabetes mellitus with foot ulcer: Secondary | ICD-10-CM | POA: Diagnosis not present

## 2023-10-22 DIAGNOSIS — I701 Atherosclerosis of renal artery: Secondary | ICD-10-CM

## 2023-10-22 DIAGNOSIS — I779 Disorder of arteries and arterioles, unspecified: Secondary | ICD-10-CM

## 2023-10-22 DIAGNOSIS — Z79899 Other long term (current) drug therapy: Secondary | ICD-10-CM | POA: Insufficient documentation

## 2023-10-22 HISTORY — PX: ABDOMINAL AORTOGRAM: CATH118222

## 2023-10-22 HISTORY — PX: LOWER EXTREMITY INTERVENTION: CATH118252

## 2023-10-22 LAB — POCT I-STAT, CHEM 8
BUN: 29 mg/dL — ABNORMAL HIGH (ref 8–23)
Calcium, Ion: 1.12 mmol/L — ABNORMAL LOW (ref 1.15–1.40)
Chloride: 104 mmol/L (ref 98–111)
Creatinine, Ser: 1.3 mg/dL — ABNORMAL HIGH (ref 0.61–1.24)
Glucose, Bld: 119 mg/dL — ABNORMAL HIGH (ref 70–99)
HCT: 37 % — ABNORMAL LOW (ref 39.0–52.0)
Hemoglobin: 12.6 g/dL — ABNORMAL LOW (ref 13.0–17.0)
Potassium: 3.5 mmol/L (ref 3.5–5.1)
Sodium: 141 mmol/L (ref 135–145)
TCO2: 28 mmol/L (ref 22–32)

## 2023-10-22 SURGERY — ABDOMINAL AORTOGRAM
Anesthesia: LOCAL

## 2023-10-22 MED ORDER — ONDANSETRON HCL 4 MG/2ML IJ SOLN: 4.0000 mg | Freq: Four times a day (QID) | INTRAMUSCULAR | Status: DC | PRN

## 2023-10-22 MED ORDER — HYDRALAZINE HCL 20 MG/ML IJ SOLN
INTRAMUSCULAR | Status: DC | PRN
  Administered 2023-10-22: 10 mg via INTRAVENOUS

## 2023-10-22 MED ORDER — HYDRALAZINE HCL 20 MG/ML IJ SOLN: 5.0000 mg | INTRAMUSCULAR | Status: DC | PRN

## 2023-10-22 MED ORDER — ASPIRIN 81 MG PO TBEC: 81.0000 mg | DELAYED_RELEASE_TABLET | Freq: Every day | ORAL | Status: DC

## 2023-10-22 MED ORDER — MIDAZOLAM HCL 2 MG/2ML IJ SOLN
INTRAMUSCULAR | Status: AC
  Filled 2023-10-22: qty 2

## 2023-10-22 MED ORDER — SODIUM CHLORIDE 0.9% FLUSH: 3.0000 mL | Freq: Two times a day (BID) | INTRAVENOUS | Status: DC

## 2023-10-22 MED ORDER — SODIUM CHLORIDE 0.9 % IV SOLN: 250.0000 mL | INTRAVENOUS | Status: DC | PRN

## 2023-10-22 MED ORDER — OXYCODONE HCL 5 MG PO TABS: 5.0000 mg | ORAL_TABLET | ORAL | Status: DC | PRN

## 2023-10-22 MED ORDER — LIDOCAINE HCL (PF) 1 % IJ SOLN
INTRAMUSCULAR | Status: DC | PRN
  Administered 2023-10-22: 15 mL

## 2023-10-22 MED ORDER — HYDRALAZINE HCL 20 MG/ML IJ SOLN
INTRAMUSCULAR | Status: AC
  Filled 2023-10-22: qty 1

## 2023-10-22 MED ORDER — FENTANYL CITRATE (PF) 100 MCG/2ML IJ SOLN
INTRAMUSCULAR | Status: AC
  Filled 2023-10-22: qty 2

## 2023-10-22 MED ORDER — SODIUM CHLORIDE 0.9 % WEIGHT BASED INFUSION: 1.0000 mL/kg/h | INTRAVENOUS | Status: DC

## 2023-10-22 MED ORDER — HEPARIN SODIUM (PORCINE) 1000 UNIT/ML IJ SOLN
INTRAMUSCULAR | Status: DC | PRN
  Administered 2023-10-22: 8000 [IU] via INTRAVENOUS

## 2023-10-22 MED ORDER — MIDAZOLAM HCL (PF) 2 MG/2ML IJ SOLN
INTRAMUSCULAR | Status: DC | PRN
  Administered 2023-10-22: 1 mg via INTRAVENOUS
  Administered 2023-10-22: 2 mg via INTRAVENOUS

## 2023-10-22 MED ORDER — LIDOCAINE HCL (PF) 1 % IJ SOLN
INTRAMUSCULAR | Status: AC
  Filled 2023-10-22: qty 30

## 2023-10-22 MED ORDER — HEPARIN (PORCINE) IN NACL 2000-0.9 UNIT/L-% IV SOLN
INTRAVENOUS | Status: DC | PRN
  Administered 2023-10-22: 1000 mL

## 2023-10-22 MED ORDER — IODIXANOL 320 MG/ML IV SOLN
INTRAVENOUS | Status: DC | PRN
  Administered 2023-10-22: 80 mL

## 2023-10-22 MED ORDER — CLOPIDOGREL BISULFATE 75 MG PO TABS: 75.0000 mg | ORAL_TABLET | Freq: Every day | ORAL | Status: DC

## 2023-10-22 MED ORDER — SODIUM CHLORIDE 0.9 % IV SOLN: INTRAVENOUS | Status: DC

## 2023-10-22 MED ORDER — ACETAMINOPHEN 325 MG PO TABS: 650.0000 mg | ORAL_TABLET | ORAL | Status: DC | PRN

## 2023-10-22 MED ORDER — LABETALOL HCL 5 MG/ML IV SOLN
10.0000 mg | INTRAVENOUS | Status: DC | PRN
  Administered 2023-10-22: 10 mg via INTRAVENOUS
  Filled 2023-10-22: qty 4

## 2023-10-22 MED ORDER — FENTANYL CITRATE (PF) 100 MCG/2ML IJ SOLN
INTRAMUSCULAR | Status: DC | PRN
  Administered 2023-10-22: 25 ug via INTRAVENOUS
  Administered 2023-10-22: 50 ug via INTRAVENOUS

## 2023-10-22 MED ORDER — SODIUM CHLORIDE 0.9% FLUSH: 3.0000 mL | INTRAVENOUS | Status: DC | PRN

## 2023-10-22 SURGICAL SUPPLY — 17 items
CATH NAVICROSS ST 65CM (CATHETERS) IMPLANT
CATH OMNI FLUSH 5F 65CM (CATHETERS) IMPLANT
CLOSURE MYNX CONTROL 5F (Vascular Products) IMPLANT
DCB IN.PACT 5X40 (BALLOONS) IMPLANT
GLIDEWIRE ADV .035X260CM (WIRE) IMPLANT
KIT ENCORE 26 ADVANTAGE (KITS) IMPLANT
KIT MICROPUNCTURE NIT STIFF (SHEATH) IMPLANT
KIT PV (KITS) ×1 IMPLANT
KIT SINGLE USE MANIFOLD (KITS) IMPLANT
SET ATX-X65L (MISCELLANEOUS) IMPLANT
SHEATH CATAPULT 5F 45 MP (SHEATH) IMPLANT
SHEATH PINNACLE 5F 10CM (SHEATH) IMPLANT
SHEATH PROBE COVER 6X72 (BAG) IMPLANT
SYR MEDRAD MARK 7 150ML (SYRINGE) ×1 IMPLANT
TRANSDUCER W/STOPCOCK (MISCELLANEOUS) ×1 IMPLANT
TRAY PV CATH (CUSTOM PROCEDURE TRAY) ×1 IMPLANT
WIRE BENTSON .035X145CM (WIRE) IMPLANT

## 2023-10-22 NOTE — Progress Notes (Signed)
 Discharge instructions reviewed with patient and family at bedside. Denies questions concerns. PT tolerated PO intake. Due to patient non ambulatory status, a site check was performed after the patient moved lower extremities in bed for approx 4-5 mins, then stood and pivoted to the wheelchair. Site was assessed after the transfer and remains clean dry and intact. Patient able to void without difficulty. No s/s of complications. PT escorted from the unit via wheel chair to personal vehicle.

## 2023-10-22 NOTE — Interval H&P Note (Signed)
 History and Physical Interval Note:  10/22/2023 2:02 PM  [name]  has presented today for surgery, with the diagnosis of paod.  The various methods of treatment have been discussed with the patient and family. After consideration of risks, benefits and other options for treatment, the patient has consented to  Procedure(s): ABDOMINAL AORTOGRAM (N/A) LOWER EXTREMITY INTERVENTION (N/A) as a surgical intervention.  The patient's history has been reviewed, patient examined, no change in status, stable for surgery.  I have reviewed the patient's chart and labs.  Questions were answered to the patient's satisfaction.     Hunter [name]

## 2023-10-22 NOTE — Discharge Instructions (Signed)
 Femoral Site Care The following information offers guidance on how to care for yourself after your procedure. Your health care provider may also give you more specific instructions. If you have problems or questions, contact your health care provider. What can I expect after the procedure? After the procedure, it is common to have bruising and tenderness at the incision site. This usually fades within 1-2 weeks. Follow these instructions at home: Incision site care  Follow instructions from your health care provider about how to take care of your incision site. Make sure you: Wash your hands with soap and water for at least 20 seconds before and after you change your bandage (dressing). If soap and water are not available, use hand sanitizer. Remove your dressing in 24 hours. Leave stitches (sutures), skin glue, or adhesive strips in place. These skin closures may need to stay in place for 2 weeks or longer. If adhesive strip edges start to loosen and curl up, you may trim the loose edges. Do not remove adhesive strips completely unless your health care provider tells you to do that. Do not take baths, swim, or use a hot tub for at least 1 week. You may shower 24 hours after the procedure or as told by your health care provider. Gently wash the incision site with plain soap and water. Pat the area dry with a clean towel. Do not rub the site. This may cause bleeding. Do not apply powder or lotion to the site. Keep the site clean and dry. Check your femoral site every day for signs of infection. Check for: Redness, swelling, or pain. Fluid or blood. Warmth. Pus or a bad smell. Activity If you were given a sedative during the procedure, it can affect you for several hours. Do not drive or operate machinery until your health care provider says that it is safe. Rest as told by your health care provider. Avoid sitting for a long time without moving. Get up to take short walks every 1-2 hours. This  is important to improve blood flow and breathing. Ask for help if you feel weak or unsteady. Return to your normal activities as told by your health care provider. Ask your health care provider what activities are safe for you and when you can return to work. Avoid activities that take a lot of effort for the first 2-3 days after your procedure, or as long as directed. Do not lift anything that is heavier than 10 lb (4.5 kg), or the limit that you are told, until your health care provider says that it is safe. General instructions Take over-the-counter and prescription medicines only as told by your health care provider. If you will be going home right after the procedure, plan to have a responsible adult care for you for the time you are told. This is important. Keep all follow-up visits. This is important. Contact a health care provider if: You have a fever or chills. You have any of these signs of infection at your incision site: Redness, swelling, or pain. Fluid or blood. Warmth. Pus or a bad smell. Get help right away if: The incision area swells very fast. The incision area is bleeding, and the bleeding does not stop when you hold steady pressure on the area. Your leg or foot becomes pale, cool, tingly, or numb. These symptoms may represent a serious problem that is an emergency. Do not wait to see if the symptoms will go away. Get medical help right away. Call your local emergency  services (911 in the [place] Do not drive yourself to the hospital. Summary After the procedure, it is common to have bruising and tenderness that fade within 1-2 weeks. Check your femoral site every day for signs of infection. Do not lift anything that is heavier than 10 lb (4.5 kg), or the limit that you are told, until your health care provider says that it is safe. Get help right away if the incision area swells very fast, you have bleeding at the incision area that does not stop, or your leg or foot  becomes pale, cool, or numb. This information is not intended to replace advice given to you by your health care provider. Make sure you discuss any questions you have with your health care provider. Document Revised: 09/07/2020 Document Reviewed: 02/07/2020 Elsevier Patient Education  2024 [employer].

## 2023-10-22 NOTE — Op Note (Signed)
 Patient name: Hunter [name] MRN: [id number] DOB: [birth year] Sex: male  10/22/2023 Pre-operative Diagnosis: Left toe ulcer Post-operative diagnosis:  Same Surgeon:  Malvina New Procedure Performed:  1.  Ultrasound-guided access, right femoral artery  2.  Aortobifemoral angiogram  3.  Left leg angiogram  4.  Selective injection with catheter left external iliac artery  5.  Drug-coated balloon angioplasty, left superficial femoral artery  6.  Conscious sedation, 43 minutes  7.  Closure device, Mynx   Indications: This is a 73 year old gentleman with history of left iliac and SFA stenting for a wound which healed but then recurred.  His recent ultrasound study suggest elevated velocities and stenosis at the stents.  He is here for further evaluation.  Procedure:  The patient was identified in the holding area and taken to room 8.  The patient was then placed supine on the table and prepped and draped in the usual sterile fashion.  A time out was called.  Conscious sedation was administered with the use of IV fentanyl  and Versed  under continuous physician and nurse monitoring.  Heart rate, blood pressure, and oxygen saturation were continuously monitored.  Total sedation time was 43 minutes.  Ultrasound was used to evaluate the right common femoral artery.  It was patent .  A digital ultrasound image was acquired.  A micropuncture needle was used to access the right common femoral artery under ultrasound guidance.  An 018 wire was advanced without resistance and a micropuncture sheath was placed.  The 018 wire was removed and a benson wire was placed.  The micropuncture sheath was exchanged for a 5 french sheath.  An omniflush catheter was advanced over the wire to the level of L-1.  An abdominal angiogram was obtained.  Next, using the omniflush catheter and a benson wire, the aortic bifurcation was crossed and the catheter was placed into theleft external iliac artery and left runoff was  obtained.   Findings:   Aortogram: Bilateral 50% renal artery stenosis.  The infrarenal abdominal aorta is widely patent.  The left common and external iliac arteries as well as their associated stents are widely patent.  Diffuse disease is noted throughout the right common iliac artery with about 50% stenosis.  External iliac artery common femoral patent on the right and left.  Right Lower Extremity: Proximal SFA and profunda as well as common femoral are widely patent  Left Lower Extremity: The left common femoral profundofemoral artery widely patent.  At the origin of the SFA stents, there is a 80% stenosis extending up to its origin.  The stents that were previously placed are all widely patent.  The popliteal artery is widely patent.  The anterior tibial and peroneal are the dominant runoff.  There is diffuse disease out of the foot  Intervention: After the above images were acquired the decision was made to proceed with intervention.  Over a Glidewire advantage, a 5 x 45 cm sheath was placed.  The patient was heparinized.  The wire was navigated into the SFA stents.  I selected a 5 x 40 Medtronic drug-coated balloon and performed drug-coated balloon angioplasty of the proximal superficial femoral artery for 3 minutes at 8 atm.  Completion imaging revealed residual stenosis less than 10%.  I was satisfied with these results.  There was no complication involving the common femoral or profundofemoral artery.  Catheters and wires were removed.  A short 5 French sheath was placed and a minx was used for closure.  Of note  when removing the Mynx the balloon did not deflate and so we held manual pressure.  There was no evidence of hematoma.  Impression:  #1  Widely patent left iliac stents  #2 widely patent left SFA and popliteal stents however there was a ostial stenosis in the superficial femoral artery of approximately 80% that was treated with a 5 mm drug-coated balloon  #3  Right common iliac  stenosis  #4  Bilateral renal artery stenosis approximately 50%  V. Malvina New, M.D., [facility] [place] Office: [phone] Pager:  [phone]

## 2023-10-23 ENCOUNTER — Other Ambulatory Visit: Payer: Self-pay

## 2023-10-23 ENCOUNTER — Encounter (HOSPITAL_COMMUNITY): Payer: Self-pay | Admitting: Surgery

## 2023-10-25 ENCOUNTER — Other Ambulatory Visit: Payer: Self-pay | Admitting: Surgery

## 2023-10-25 DIAGNOSIS — I70245 Atherosclerosis of native arteries of left leg with ulceration of other part of foot: Secondary | ICD-10-CM

## 2023-10-25 DIAGNOSIS — I779 Disorder of arteries and arterioles, unspecified: Secondary | ICD-10-CM

## 2023-10-25 DIAGNOSIS — I739 Peripheral vascular disease, unspecified: Secondary | ICD-10-CM

## 2023-10-31 ENCOUNTER — Other Ambulatory Visit: Payer: Self-pay

## 2023-10-31 ENCOUNTER — Other Ambulatory Visit: Payer: Self-pay | Admitting: Cardiology

## 2023-10-31 ENCOUNTER — Other Ambulatory Visit (HOSPITAL_COMMUNITY): Payer: Self-pay

## 2023-10-31 DIAGNOSIS — I25118 Atherosclerotic heart disease of native coronary artery with other forms of angina pectoris: Secondary | ICD-10-CM

## 2023-10-31 MED ORDER — TRESIBA FLEXTOUCH 200 UNIT/ML ~~LOC~~ SOPN
66.0000 [IU] | PEN_INJECTOR | Freq: Every day | SUBCUTANEOUS | 3 refills | Status: AC
  Filled 2023-10-31: qty 27, 81d supply, fill #0
  Filled 2024-02-04: qty 9, 27d supply, fill #1

## 2023-10-31 MED ORDER — TECHLITE PLUS PEN NEEDLES 32G X 4 MM MISC
1.0000 | Freq: Four times a day (QID) | 5 refills | Status: AC
  Filled 2023-10-31 (×2): qty 400, 100d supply, fill #0
  Filled 2024-02-04: qty 400, 100d supply, fill #1

## 2023-10-31 MED ORDER — NITROGLYCERIN 0.4 MG SL SUBL
0.4000 mg | SUBLINGUAL_TABLET | SUBLINGUAL | 5 refills | Status: AC | PRN
  Filled 2023-10-31: qty 25, 8d supply, fill #0

## 2023-11-20 ENCOUNTER — Other Ambulatory Visit (HOSPITAL_COMMUNITY): Payer: Self-pay

## 2023-11-20 ENCOUNTER — Other Ambulatory Visit: Payer: Self-pay

## 2023-11-20 MED ORDER — VITAMIN D (ERGOCALCIFEROL) 1.25 MG (50000 UNIT) PO CAPS
50000.0000 [IU] | ORAL_CAPSULE | ORAL | 0 refills | Status: DC
  Filled 2023-11-20 – 2023-12-18 (×3): qty 24, 84d supply, fill #0

## 2023-11-20 MED ORDER — ALBUTEROL SULFATE HFA 108 (90 BASE) MCG/ACT IN AERS
2.0000 | INHALATION_SPRAY | RESPIRATORY_TRACT | 5 refills | Status: AC
  Filled 2023-11-20: qty 6.7, 16d supply, fill #0
  Filled 2023-12-18: qty 6.7, 16d supply, fill #1
  Filled 2024-01-07: qty 6.7, 16d supply, fill #2
  Filled 2024-02-04: qty 6.7, 16d supply, fill #3
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
## 2023-11-21 ENCOUNTER — Encounter: Payer: Self-pay | Admitting: Podiatry

## 2023-11-21 ENCOUNTER — Ambulatory Visit: Admitting: Podiatry

## 2023-11-21 DIAGNOSIS — L97522 Non-pressure chronic ulcer of other part of left foot with fat layer exposed: Secondary | ICD-10-CM

## 2023-11-21 DIAGNOSIS — B351 Tinea unguium: Secondary | ICD-10-CM | POA: Diagnosis not present

## 2023-11-21 DIAGNOSIS — E1151 Type 2 diabetes mellitus with diabetic peripheral angiopathy without gangrene: Secondary | ICD-10-CM

## 2023-11-21 DIAGNOSIS — M79676 Pain in unspecified toe(s): Secondary | ICD-10-CM | POA: Diagnosis not present

## 2023-11-21 DIAGNOSIS — D689 Coagulation defect, unspecified: Secondary | ICD-10-CM | POA: Diagnosis not present

## 2023-11-21 DIAGNOSIS — I130 Hypertensive heart and chronic kidney disease with heart failure and stage 1 through stage 4 chronic kidney disease, or unspecified chronic kidney disease: Secondary | ICD-10-CM | POA: Insufficient documentation

## 2023-11-21 NOTE — Progress Notes (Signed)
 Hunter [name] presents today with his wife for his routine nail debridement.  States that recently he underwent a femorofemoral Pham distal balloon angioplasty to his legs where a couple of his stent had closed off.  This is an attempt to increase blood supply to the second digit of the left foot which does demonstrate a superficial ulceration.  Radiographs taken before this and after do demonstrate that the wound appears to be healing.  They are currently treating it with silver dressing.  Objective: Vital signs are stable he is alert and oriented x 3 pulses are nonpalpable both feet are warm to the touch much warmer than they usually are.  He has an ulceration that measures less than a centimeter in diameter overlying the PIPJ second digit left foot there is no granulation tissue there is no sign of probing there is no capsule or tendon visible.  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  Painful elongated toenails.  Plan: Debridement of toenails 1 through 5 bilateral no iatrogenic lesions noted did place silver nitrate with a dry sterile dressing on the second toe.  He is to follow-up with vascular next week.

## 2023-11-25 ENCOUNTER — Other Ambulatory Visit (HOSPITAL_COMMUNITY): Payer: Self-pay

## 2023-11-25 ENCOUNTER — Ambulatory Visit (HOSPITAL_COMMUNITY)

## 2023-11-25 ENCOUNTER — Ambulatory Visit (HOSPITAL_COMMUNITY): Admission: RE | Admit: 2023-11-25 | Source: Ambulatory Visit

## 2023-11-25 MED ORDER — LEVOFLOXACIN 500 MG PO TABS
500.0000 mg | ORAL_TABLET | Freq: Every day | ORAL | 0 refills | Status: AC
Start: 2023-11-25 — End: ?
  Filled 2023-11-25: qty 7, 7d supply, fill #0

## 2023-11-25 MED ORDER — PREDNISONE 10 MG (21) PO TBPK
ORAL_TABLET | ORAL | 0 refills | Status: AC
  Filled 2023-11-25: qty 21, 6d supply, fill #0

## 2023-11-26 ENCOUNTER — Other Ambulatory Visit (HOSPITAL_COMMUNITY): Payer: Self-pay

## 2023-12-06 ENCOUNTER — Other Ambulatory Visit (HOSPITAL_COMMUNITY): Payer: Self-pay

## 2023-12-18 ENCOUNTER — Other Ambulatory Visit (HOSPITAL_COMMUNITY): Payer: Self-pay

## 2023-12-18 MED ORDER — TRAMADOL HCL 50 MG PO TABS
50.0000 mg | ORAL_TABLET | Freq: Three times a day (TID) | ORAL | 2 refills | Status: AC | PRN
  Filled 2023-12-23 – 2024-01-07 (×2): qty 90, 30d supply, fill #0
  Filled 2024-02-04: qty 90, 30d supply, fill #1

## 2023-12-23 ENCOUNTER — Other Ambulatory Visit (HOSPITAL_COMMUNITY): Payer: Self-pay

## 2024-01-03 ENCOUNTER — Other Ambulatory Visit (HOSPITAL_COMMUNITY): Payer: Self-pay

## 2024-01-07 ENCOUNTER — Other Ambulatory Visit: Payer: Self-pay

## 2024-01-07 ENCOUNTER — Other Ambulatory Visit (HOSPITAL_COMMUNITY): Payer: Self-pay

## 2024-01-07 MED ORDER — INSULIN LISPRO (1 UNIT DIAL) 100 UNIT/ML (KWIKPEN)
15.0000 [IU] | PEN_INJECTOR | Freq: Three times a day (TID) | SUBCUTANEOUS | 3 refills | Status: AC
  Filled 2024-01-07: qty 15, 33d supply, fill #0
  Filled 2024-02-04: qty 15, 33d supply, fill #1

## 2024-01-07 MED ORDER — VITAMIN D (ERGOCALCIFEROL) 1.25 MG (50000 UNIT) PO CAPS: 50000.0000 [IU] | ORAL_CAPSULE | ORAL | 0 refills | Status: AC

## 2024-02-04 ENCOUNTER — Other Ambulatory Visit (HOSPITAL_COMMUNITY): Payer: Self-pay

## 2024-02-04 ENCOUNTER — Other Ambulatory Visit: Payer: Self-pay

## 2024-02-05 ENCOUNTER — Other Ambulatory Visit (HOSPITAL_COMMUNITY): Payer: Self-pay

## 2024-02-05 MED ORDER — TRIAMTERENE-HCTZ 37.5-25 MG PO TABS
1.0000 | ORAL_TABLET | Freq: Every morning | ORAL | 0 refills | Status: AC
  Filled 2024-02-05: qty 90, 90d supply, fill #0

## 2024-02-07 ENCOUNTER — Other Ambulatory Visit (HOSPITAL_COMMUNITY): Payer: Self-pay

## 2024-02-10 ENCOUNTER — Ambulatory Visit (HOSPITAL_COMMUNITY)

## 2024-02-10 ENCOUNTER — Ambulatory Visit

## 2024-02-20 ENCOUNTER — Ambulatory Visit: Admitting: Podiatry

## 2024-03-05 ENCOUNTER — Ambulatory Visit: Admitting: Emergency Medicine
